# Patient Record
Sex: Male | Born: 1955
Health system: Southern US, Community
[De-identification: ages and names within clinical notes are randomized; demographics above are authoritative.]

## PROBLEM LIST (undated history)

## (undated) DIAGNOSIS — I2510 Atherosclerotic heart disease of native coronary artery without angina pectoris: Secondary | ICD-10-CM

## (undated) DIAGNOSIS — M1711 Unilateral primary osteoarthritis, right knee: Secondary | ICD-10-CM

## (undated) DIAGNOSIS — C4490 Unspecified malignant neoplasm of skin, unspecified: Secondary | ICD-10-CM

## (undated) DIAGNOSIS — M1990 Unspecified osteoarthritis, unspecified site: Secondary | ICD-10-CM

## (undated) DIAGNOSIS — S83249A Other tear of medial meniscus, current injury, unspecified knee, initial encounter: Secondary | ICD-10-CM

## (undated) DIAGNOSIS — Z9889 Other specified postprocedural states: Secondary | ICD-10-CM

## (undated) DIAGNOSIS — M25561 Pain in right knee: Secondary | ICD-10-CM

## (undated) DIAGNOSIS — F419 Anxiety disorder, unspecified: Secondary | ICD-10-CM

## (undated) DIAGNOSIS — Z955 Presence of coronary angioplasty implant and graft: Secondary | ICD-10-CM

## (undated) DIAGNOSIS — I4892 Unspecified atrial flutter: Secondary | ICD-10-CM

## (undated) DIAGNOSIS — I472 Ventricular tachycardia: Secondary | ICD-10-CM

## (undated) DIAGNOSIS — E785 Hyperlipidemia, unspecified: Secondary | ICD-10-CM

## (undated) DIAGNOSIS — K219 Gastro-esophageal reflux disease without esophagitis: Secondary | ICD-10-CM

## (undated) DIAGNOSIS — K5790 Diverticulosis of intestine, part unspecified, without perforation or abscess without bleeding: Secondary | ICD-10-CM

## (undated) DIAGNOSIS — I10 Essential (primary) hypertension: Secondary | ICD-10-CM

## (undated) DIAGNOSIS — K5792 Diverticulitis of intestine, part unspecified, without perforation or abscess without bleeding: Secondary | ICD-10-CM

## (undated) DIAGNOSIS — S83209A Unspecified tear of unspecified meniscus, current injury, unspecified knee, initial encounter: Principal | ICD-10-CM

## (undated) HISTORY — DX: Diverticulitis of intestine, part unspecified, without perforation or abscess without bleeding: K57.92

## (undated) HISTORY — PX: COLONOSCOPY: SHX174

## (undated) HISTORY — DX: Other tear of medial meniscus, current injury, unspecified knee, initial encounter: S83.249A

## (undated) HISTORY — PX: APPENDECTOMY: SHX54

## (undated) HISTORY — DX: Essential (primary) hypertension: I10

## (undated) HISTORY — DX: Hyperlipidemia, unspecified: E78.5

## (undated) HISTORY — DX: Pain in right knee: M25.561

## (undated) HISTORY — DX: Gastro-esophageal reflux disease without esophagitis: K21.9

## (undated) HISTORY — PX: ROTATOR CUFF REPAIR: SHX139

## (undated) HISTORY — PX: TRICEPS TENDON REPAIR: SHX2577

## (undated) HISTORY — PX: TONSILLECTOMY: SUR1361

## (undated) HISTORY — PX: HAND SURGERY: SHX662

## (undated) HISTORY — PX: KNEE ARTHROSCOPY: SUR90

## (undated) HISTORY — DX: Unspecified tear of unspecified meniscus, current injury, unspecified knee, initial encounter: S83.209A

## (undated) HISTORY — DX: Anxiety disorder, unspecified: F41.9

---

## 1997-11-22 ENCOUNTER — Encounter: Payer: Self-pay | Admitting: Internal Medicine

## 1997-11-22 ENCOUNTER — Inpatient Hospital Stay (HOSPITAL_COMMUNITY): Admission: EM | Admit: 1997-11-22 | Discharge: 1997-11-25 | Payer: Self-pay | Admitting: Internal Medicine

## 1999-03-01 ENCOUNTER — Ambulatory Visit (HOSPITAL_COMMUNITY): Admission: RE | Admit: 1999-03-01 | Discharge: 1999-03-01 | Payer: Self-pay | Admitting: Internal Medicine

## 1999-03-01 ENCOUNTER — Encounter: Payer: Self-pay | Admitting: Internal Medicine

## 2000-02-05 ENCOUNTER — Emergency Department (HOSPITAL_COMMUNITY): Admission: EM | Admit: 2000-02-05 | Discharge: 2000-02-05 | Payer: Self-pay | Admitting: Emergency Medicine

## 2001-11-24 ENCOUNTER — Emergency Department (HOSPITAL_COMMUNITY): Admission: EM | Admit: 2001-11-24 | Discharge: 2001-11-24 | Payer: Self-pay | Admitting: Emergency Medicine

## 2001-11-24 ENCOUNTER — Encounter: Payer: Self-pay | Admitting: Emergency Medicine

## 2002-02-03 ENCOUNTER — Ambulatory Visit (HOSPITAL_COMMUNITY): Admission: RE | Admit: 2002-02-03 | Discharge: 2002-02-03 | Payer: Self-pay | Admitting: Cardiology

## 2002-02-03 ENCOUNTER — Encounter: Payer: Self-pay | Admitting: Cardiology

## 2002-06-29 ENCOUNTER — Ambulatory Visit (HOSPITAL_BASED_OUTPATIENT_CLINIC_OR_DEPARTMENT_OTHER): Admission: RE | Admit: 2002-06-29 | Discharge: 2002-06-29 | Payer: Self-pay | Admitting: Orthopedic Surgery

## 2003-04-16 ENCOUNTER — Encounter: Admission: RE | Admit: 2003-04-16 | Discharge: 2003-04-16 | Payer: Self-pay | Admitting: Cardiology

## 2003-09-21 ENCOUNTER — Ambulatory Visit (HOSPITAL_BASED_OUTPATIENT_CLINIC_OR_DEPARTMENT_OTHER): Admission: RE | Admit: 2003-09-21 | Discharge: 2003-09-21 | Payer: Self-pay | Admitting: Orthopedic Surgery

## 2003-09-21 ENCOUNTER — Ambulatory Visit (HOSPITAL_COMMUNITY): Admission: RE | Admit: 2003-09-21 | Discharge: 2003-09-21 | Payer: Self-pay | Admitting: Orthopedic Surgery

## 2005-01-22 ENCOUNTER — Ambulatory Visit (HOSPITAL_BASED_OUTPATIENT_CLINIC_OR_DEPARTMENT_OTHER): Admission: RE | Admit: 2005-01-22 | Discharge: 2005-01-22 | Payer: Self-pay | Admitting: Orthopedic Surgery

## 2005-01-22 ENCOUNTER — Ambulatory Visit (HOSPITAL_COMMUNITY): Admission: RE | Admit: 2005-01-22 | Discharge: 2005-01-22 | Payer: Self-pay | Admitting: Orthopedic Surgery

## 2005-03-13 ENCOUNTER — Ambulatory Visit (HOSPITAL_BASED_OUTPATIENT_CLINIC_OR_DEPARTMENT_OTHER): Admission: RE | Admit: 2005-03-13 | Discharge: 2005-03-13 | Payer: Self-pay | Admitting: Otolaryngology

## 2005-03-13 ENCOUNTER — Encounter (INDEPENDENT_AMBULATORY_CARE_PROVIDER_SITE_OTHER): Payer: Self-pay | Admitting: Specialist

## 2005-05-24 ENCOUNTER — Ambulatory Visit (HOSPITAL_COMMUNITY): Admission: RE | Admit: 2005-05-24 | Discharge: 2005-05-24 | Payer: Self-pay | Admitting: Internal Medicine

## 2005-07-19 ENCOUNTER — Other Ambulatory Visit: Admission: RE | Admit: 2005-07-19 | Discharge: 2005-07-19 | Payer: Self-pay | Admitting: Interventional Radiology

## 2005-07-19 ENCOUNTER — Encounter (INDEPENDENT_AMBULATORY_CARE_PROVIDER_SITE_OTHER): Payer: Self-pay | Admitting: *Deleted

## 2005-07-19 ENCOUNTER — Encounter: Admission: RE | Admit: 2005-07-19 | Discharge: 2005-07-19 | Payer: Self-pay | Admitting: General Surgery

## 2006-07-07 IMAGING — US US BIOPSY
1 series · 7 of 7 positions shown · non-contrast
Comparison: none

CLINICAL DATA: Solitary nodule in the right lobe.
 ULTRASOUND GUIDED RIGHT THYROID BIOPSY:
 Procedure:  The right neck was prepped and draped in a sterile fashion.  Lidocaine was utilized for local anesthesia.  Three 25 gauge fine needle aspirates of the right thyroid nodule were obtained.  No complications.

[Series 1: unknown · 0.09mm/px · 7 of 7 slices shown]
[im 1/7]
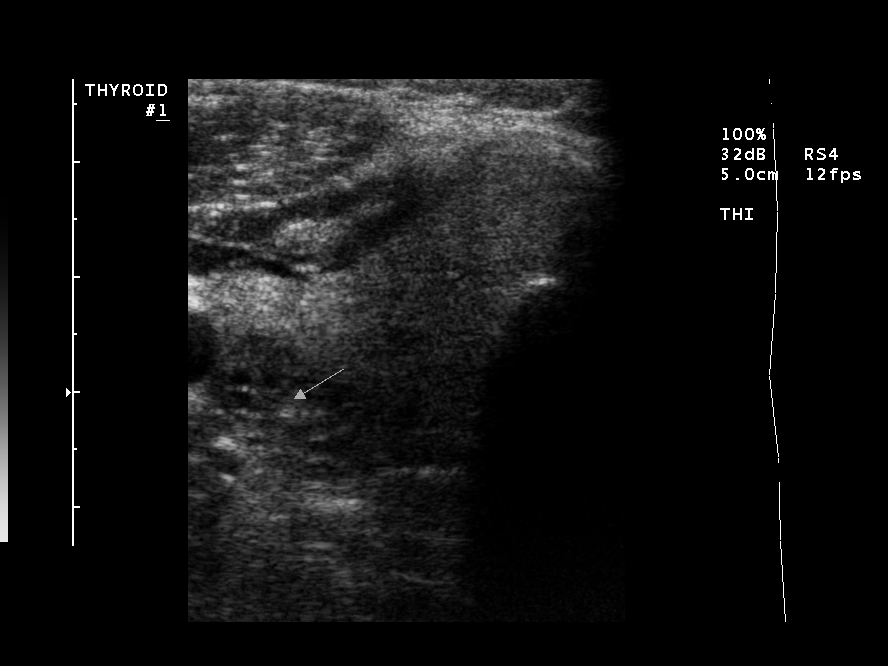
[im 2/7]
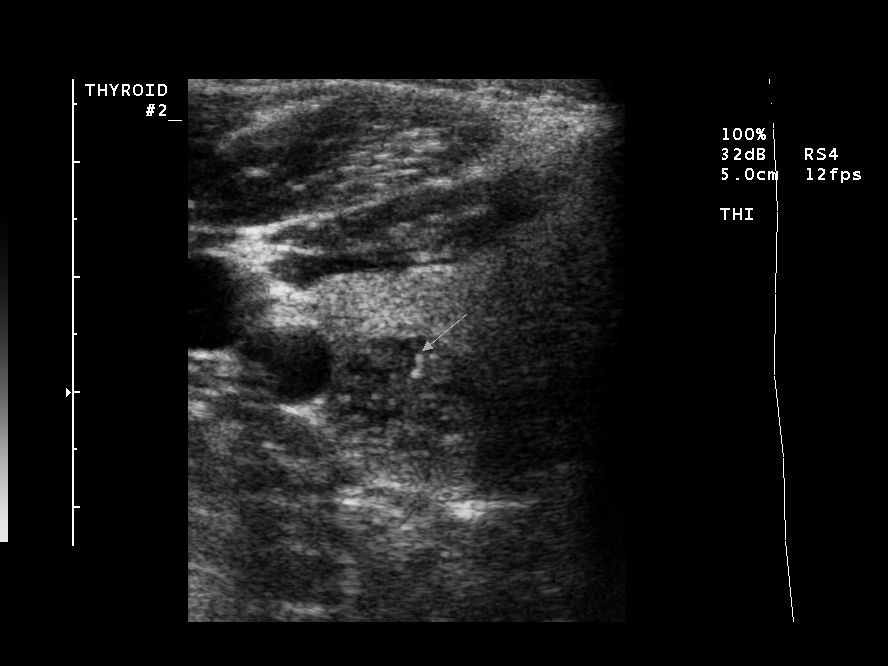
[im 3/7]
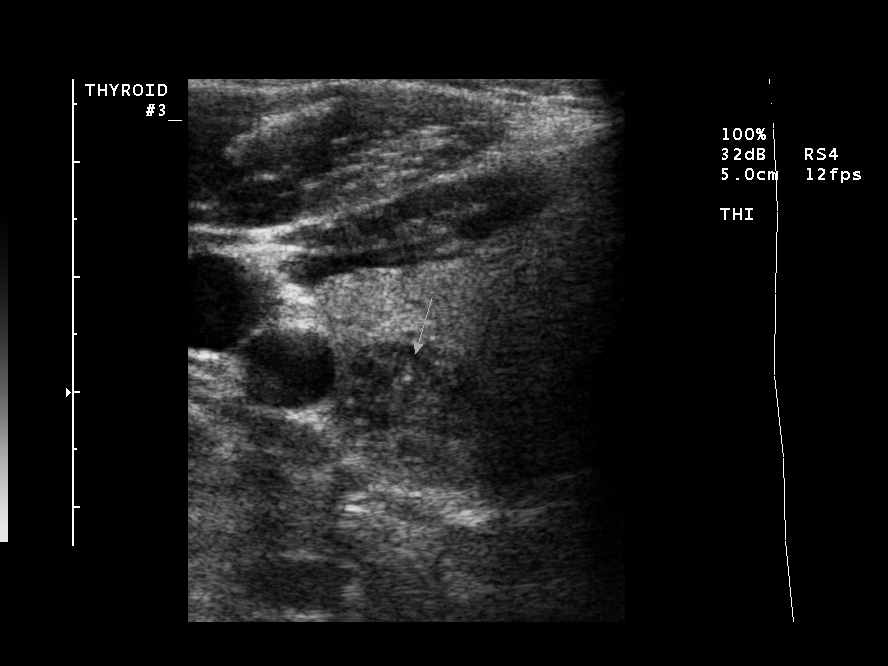
[im 4/7]
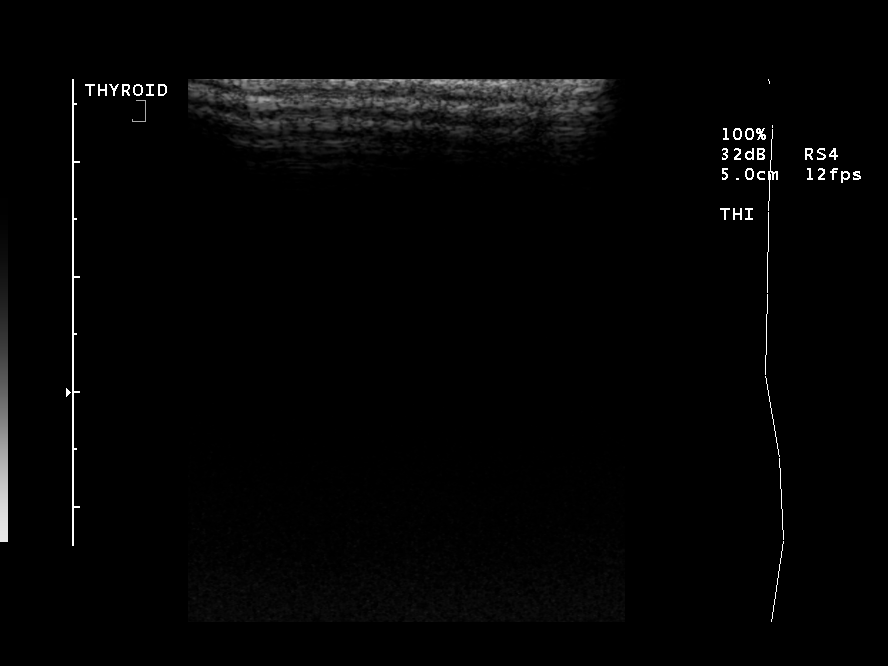
[im 5/7]
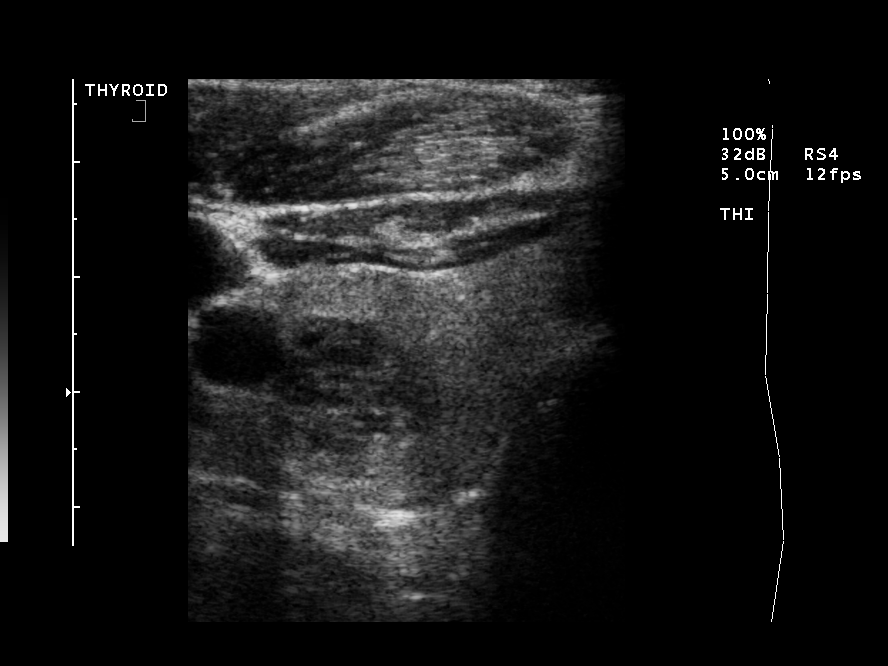
[im 6/7]
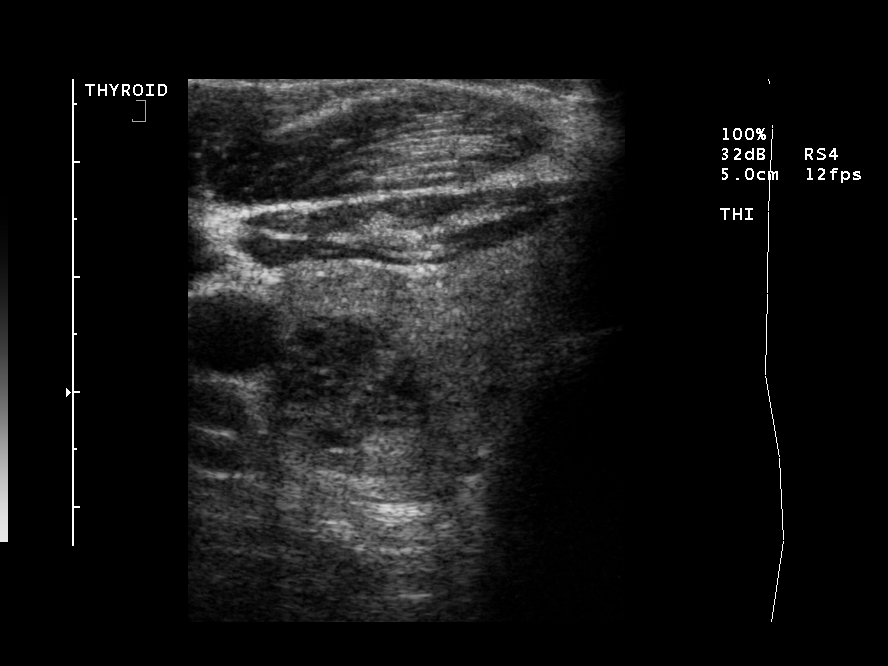
[im 7/7]
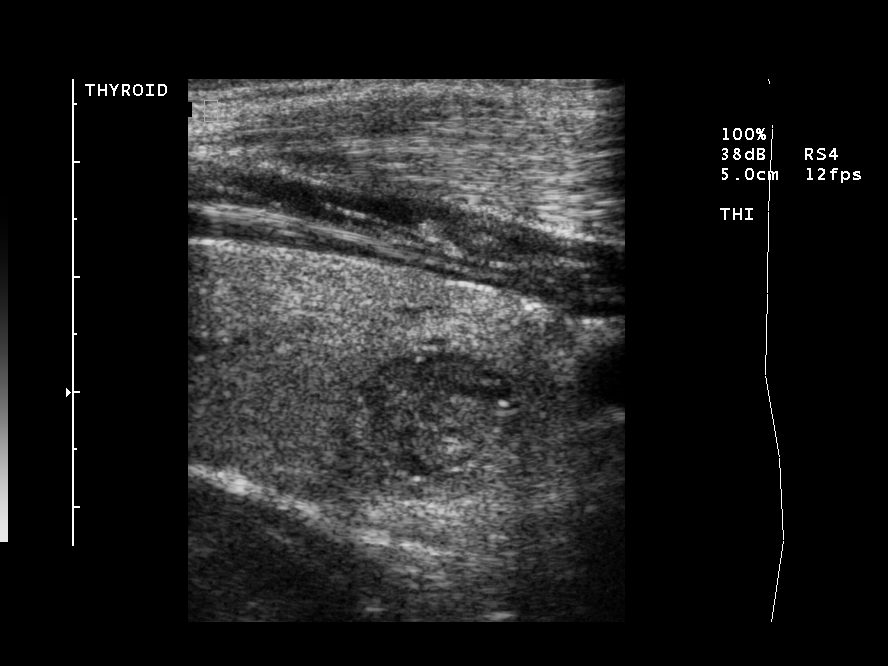

[7 of 7 positions shown; findings below may reference images not displayed]

FINDINGS: The images document needle placement in the right thyroid nodule.  Post biopsy imaging demonstrated no complication.
IMPRESSION: Successful fine needle aspiration biopsy of a right thyroid nodule.

## 2010-01-15 ENCOUNTER — Emergency Department (HOSPITAL_COMMUNITY): Admission: EM | Admit: 2010-01-15 | Discharge: 2010-01-15 | Payer: Self-pay | Admitting: Emergency Medicine

## 2010-02-19 HISTORY — PX: CORONARY ANGIOPLASTY WITH STENT PLACEMENT: SHX49

## 2010-07-07 NOTE — Op Note (Signed)
Marvin Carter, Marvin Carter              ACCOUNT NO.:  [id number]   MEDICAL RECORD NO.:  [id number]          PATIENT TYPE:  AMB   LOCATION:  DSC                          FACILITY:  [facility]   PHYSICIAN:  Robert A. Thurston Hole, M.D. DATE OF BIRTH:  05-Jun-1955   DATE OF PROCEDURE:  01/22/2005  DATE OF DISCHARGE:                                 OPERATIVE REPORT   PREOPERATIVE DIAGNOSIS:  1.  Left shoulder rotator cuff tear.  2.  Left shoulder biceps tendon proximal rupture  3.  Left shoulder impingement.  4.  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   POSTOPERATIVE DIAGNOSIS:  1.  Left shoulder rotator cuff tear.  2.  Left shoulder biceps tendon proximal rupture  3.  Left shoulder impingement.  4.  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   PROCEDURE:  1.  Left shoulder examination under anesthesia followed by arthroscopically      assisted rotator cuff repair using Arthrex suture anchors x2.  2.  Left shoulder biceps tendon rupture debridement and labrum tear      debridement.  3.  Left shoulder subacromial decompression.  4.  Left shoulder distal clavicle excision.   SURGEON:  Elana Alm. Thurston Hole, M.D.   ASSISTANT:  Julien Girt, P.A.   ANESTHESIA:  General.   OPERATIVE TIME:  1 hour 10 minutes.   COMPLICATIONS:  None.   INDICATIONS FOR PROCEDURE:  Marvin Carter is a 55 year old gentleman who has  had significant left shoulder pain for the past 3-4 months increasing in  nature with exam and MRI documenting a partial versus complete rotator cuff  tear, biceps tendon tear with impingement and AC joint arthrosis who has  failed conservative care; and is now to undergo arthroscopy.   DESCRIPTION:  Marvin Carter was brought to operating room on 01/22/2005 after  an interscalene block, placed in the holding room by anesthesia. He was  placed on the operating table in the supine position. After being  placed  under general anesthesia, his left shoulder was examined. He had full range  of motion and his shoulder was stable to ligamentous exam.  He was then  placed in a beach chair position and his shoulder and arm was prepped using  sterile DuraPrep, and draped using sterile technique. He received Ancef 1 gm  IV preoperatively for prophylaxis.   Initially the arthroscopy was performed through a posterior arthroscopic  portal.  The arthroscope, with a pump attached,  was placed; and through an  anterior portal an arthroscopic probe was placed. On initial inspection the  articular cartilage in the glenohumeral joint was intact. Anterior labrum  was intact. The anterior inferior labrum and anterior inferior glenohumeral  ligament complex was intact. The biceps tendon was completely torn in the  intra-articular portion; and this was thoroughly debrided and the stump of  the biceps tendon, biceps tendon anchor, and superior labrum was thoroughly  debrided. The posterior labrum was intact. Rotator cuff showed a complete  tear of the supraspinatus which was  partially debrided arthroscopically,  partial tear of the subscap and infraspinatus 25-30%; and these were  partially debrided. The teres minor was intact. The inferior capsular recess  free of pathology. Subacromial space was entered and a lateral arthroscopic  portal was made. Moderately thickened bursitis was resected. The rotator  cuff tear was well visualized on the bursal surface and this was further  debrided arthroscopically. Impingement was noted and a subacromial  decompression was carried out removing 6 mm of the undersurface of the  anterior, anterolateral, anteromedial, acromion, and CA ligament; and  release carried out as well. There was also significant spurring and  degenerative changes in the [facility] joint distal clavicle; and the distal 5-6 mm  of clavicle was resected with the 6-mm bur.   After this was done and then through  an accessory anterolateral portal, two  separate Arthrex suture anchors, 5.5 anchors were placed proximally 4-5 mm  apart from each other. Each of these anchors had two sutures in them; and  each of these sutures were passed, arthroscopically, through the rotator  cuff tear and then sequentially tied down thus four total sutures were used  to repair the rotator cuff tear back down to the greater tuberosity. After  this was done a firm and tight repair was achieved. The shoulder could then  be brought through a full range of motion with no impingement on the repair.  At this point, it was felt that all of the pathology had been satisfactorily  addressed. The instruments were removed. Portals closed with 3-0 nylon  suture. Sterile dressings and a sling applied and the patient awakened and  taken to recovery in stable condition. Followup care Marvin Carter will be  followed as an outpatient on Percocet for pain. He will see me back in the  office in a week for sutures out and followup.      Robert A. Thurston Hole, M.D.  Electronically Signed     RAW/MEDQ  D:  01/22/2005  T:  01/22/2005  Job:  [id number]

## 2010-07-07 NOTE — Op Note (Signed)
NAME:  Marvin Carter, Marvin Carter                        ACCOUNT NO.:  [id number]   MEDICAL RECORD NO.:  [id number]                   PATIENT TYPE:  EMS   LOCATION:  MINO                                 FACILITY:  [facility]   PHYSICIAN:  Dionne Ano. Everlene Other, M.D.         DATE OF BIRTH:  03/30/1955   DATE OF PROCEDURE:  11/24/2001  DATE OF DISCHARGE:                                 OPERATIVE REPORT   HISTORY OF PRESENT ILLNESS:  I had the pleasure to see this patient upon the  consultation request by Doug Sou, M.D.  He is a 55 year old right-hand  dominant male who injured his bilateral middle fingers as a steel cutting  device slapped a sheet of steel, subsequently lacerating the tips of his  middle fingers bilaterally.  He sustained amputations.  He has the right  middle finger tip for my review.  He is up to date on his tetanus.  The  patient was given 1 g of Ancef through IV access in the left upper  extremity.  He was transported to the emergency room via ambulance, and I  was asked to see him by Dr. Ethelda Chick.  The patient is alert and oriented.  He is accompanied in the ER with his wife.   PAST MEDICAL HISTORY:  History of hypertension.   PAST SURGICAL HISTORY:  Appendectomy, knee arthroscopies, biceps and  subscapularis reattachment.   MEDICATIONS:  Hypertension medicine.   ALLERGIES:  None.   SOCIAL HISTORY:  He occasionally drinks.  He is self-employed.  He works for  [employer].   PHYSICAL EXAMINATION:  GENERAL:  He is a very pleasant male, alert and  oriented, in no acute distress.  HEENT:  Normal.  CHEST:  Clear.  ABDOMEN:  Nontender, nondistended.  EXTREMITIES:  The bilateral lower extremities are atraumatic.  The middle  fingers bilaterally had distal tip amputations present.  FDP, FDS, and  extensor function is intact.  There is no obvious instability.  There are no  signs of active infection.  The middle finger, right hand, is amputated at  the  mid-sterile matrix level.  The amputation is oblique in nature.  The  amputation about the left middle finger involves the tip but no bone.  There  is exposed subcu, and the nail is primarily intact.  I have discussed the  issues at hand with him.   LABORATORY DATA:  His x-rays were reviewed, which show a fracture about the  right middle finger distal phalanx.  The left middle finger is without  fracture or encroachment.   IMPRESSION:  Distal tip amputations, bilateral middle fingers, right and  left.   PLAN:  I have verbally consented him for I&D and repair of structures as  necessary.  With this in mind, he desires to proceed.   DESCRIPTION OF PROCEDURE:  The patient was given a lidocaine-Marcaine  without epinephrine block by myself.  Following this he was prepped and  draped in the usual sterile fashion bilaterally with Betadine scrub and  paint.  Once this was done, a sterile field was secured and the patient  underwent I&D of the skin and subcutaneous tissue about the left middle  finger.  Partial nail plate removal was accomplished as necessary and  following this, the area was thoroughly cleaned and dressed.  This was  dressed with Adaptic and will likely do well by healing with secondary  intention.  Once this was done and a sterile dressing was applied, the  attention was turned toward the right middle finger, where I&D of skin,  subcutaneous tissue, and bone occurred.  This was incisional debridement  without difficulty removing particulate matter secondary to an open  fracture.  Following this the patient underwent a V-Y type advancement flap.  This was done by making cuts with a #15 blade knife, gently mobilizing the  flap under 4.0 loupe magnification, defatting the area, and then suturing it  to the nail bed.  The nail plate removal was accomplished during this  period.  He tolerated this well.  This was a volar advancement flap  utilizing cuts.  He tolerated this well.   The patient had gentle  mobilization and insetting of the volar flap.  Following this, Adaptic was  placed under the eponychial fold and the patient then had refill noted to be  excellent.  I at this point in time placed a sterile dressing on the finger  and then dressed this accordingly with finger splint applied.  Thus he  underwent I&D of the open fracture as well as volar advancement flap and  nail plate removal.  He tolerated this well.  Once a dressing was applied, I  discussed with him elevation and other precautions.  I have asked him to not  smoke, to eat healthy, and to take Keflex 500 mg one p.o. q.i.d. x7 days.  I  have also given him Percocet and Robaxin to be taken as directed.  The  Percocet is 7.5 mg one p.o. q.6h., the Robaxin is 500 mg one p.o. q.6-8h.  p.r.n. spasm.  He will return to the office and see my therapist in three  days and return to see me in eight to 10 days.  I have asked him to notify  me should any problems occur; otherwise, I will look forward to seeing him  in the office in follow-up.  We have gone over all precautions, etc., all  questions have been encouraged and answered.                                                Dionne Ano. Everlene Other, M.D.    Nash Mantis  D:  11/24/2001  T:  11/25/2001  Job:  [id number]   cc:   Doug Sou, M.D.

## 2010-07-07 NOTE — Op Note (Signed)
Marvin Carter, Marvin Carter                        ACCOUNT NO.:  [id number]   MEDICAL RECORD NO.:  [id number]                   PATIENT TYPE:  AMB   LOCATION:  DSC                                  FACILITY:  [facility]   PHYSICIAN:  Robert A. Thurston Hole, M.D.              DATE OF BIRTH:  30-Sep-1955   DATE OF PROCEDURE:  09/21/2003  DATE OF DISCHARGE:                                 OPERATIVE REPORT   PREOPERATIVE DIAGNOSES:  Left knee medial and lateral meniscal tears with  chondromalacia/degenerative joint disease.   POSTOPERATIVE DIAGNOSES:  Left knee medial and lateral meniscal tears with  chondromalacia/degenerative joint disease.   PROCEDURE:  1. Left examination under anesthesia, followed by an arthroscopic partial     medial and lateral meniscectomies.  2. Left knee chondroplasty.   SURGEON:  Elana Alm. Thurston Hole, M.D.   ASSISTANT:  Julien Girt, P.A.   ANESTHESIA:  General.   OPERATIVE TIME:  Was 30 minutes.   COMPLICATIONS:  None.   INDICATIONS FOR PROCEDURE:  The patient is a 55 year old gentleman who has  had two to three months of increasing left knee pain with an examination and  MRI documenting medial and lateral meniscal tears with chondromalacia and  degenerative joint disease, who has failed conservative care and is now to  undergo an arthroscopy.   DESCRIPTION OF PROCEDURE:  The patient is brought to the operating room on  September 21, 2003, and placed on the operating room table in the supine  position.  After an adequate level of general anesthesia was obtained, his  left knee was examined under anesthesia.  Range of motion was 0-125 degrees,  with 1-2+ crepitation.  The knee was stable to ligamentous examination, with  normal patella tracking.  The left leg was prepped using sterile DuraPrep  and draped using a sterile technique and sterilely injected with 0.25%  Marcaine with epinephrine.  Initially the arthroscopy was performed through  an anterolateral portal.   The arthroscope with a pump attached was placed,  and through an anteromedial portal an arthroscopic probe was placed.  On  initial inspection of the medial compartment, he had 75% grade 3  chondromalacia which was debrided.  He had tearing of the posterior and  medial horn of the medial meniscus, of which 50% was resected back to a  stable rim.  The inner condylar notch was inspected.  The anterior and  posterior cruciate ligaments were normal.  The lateral compartment showed  75% grade 4, and the rest grade 3 chondromalacia, which was debrided.  The  lateral meniscal remnant posterior horn, of which only 20% remained, was  further debrided by another 50% for tears.  The patellofemoral joint showed  75%-80% grade 3 and grade 4 chondromalacia and degenerative joint disease on  the patella and femoral groove, and this was debrided.  The patella tracked  normally.  The medial and lateral gutters were free of pathology.  After  this was done, it was felt that all pathology had been satisfactorily  addressed.  The instruments were removed.  The portals were closed with #3-0  nylon suture and injected with 0.25% Marcaine with epinephrine and 4 mg of  morphine.  Sterile dressings were applied.  The patient was awakened and taken to the recovery room in stable condition.   FOLLOW-UP CARE:  The patient will be followed as an outpatient on Vicodin  and Naprosyn.  I will see him back in the office in one week for sutures out  and followup.                                               Robert A. Thurston Hole, M.D.    RAW/MEDQ  D:  09/21/2003  T:  09/21/2003  Job:  [id number]

## 2010-07-07 NOTE — Op Note (Signed)
NAMECLYDE, Marvin Carter                        ACCOUNT NO.:  [id number]   MEDICAL RECORD NO.:  [id number]                   PATIENT TYPE:  AMB   LOCATION:  DSC                                  FACILITY:  [facility]   PHYSICIAN:  Feliberto Gottron. Turner Daniels, M.D.                DATE OF BIRTH:  07/16/1955   DATE OF PROCEDURE:  06/29/2002  DATE OF DISCHARGE:                                 OPERATIVE REPORT   PREOPERATIVE DIAGNOSIS:  Left knee intra-articular loose bodies, medial  meniscal tear, and grade III to IV chondromalacia of the lateral  compartment.   POSTOPERATIVE DIAGNOSIS:  Left knee intra-articular loose bodies, medial  meniscal tear, and grade III to IV chondromalacia of the lateral  compartment.   PROCEDURE:  Left knee arthroscopic removal of loose bodies x3.  Partial  medial meniscectomy. Debridement of chondromalacia extensive grade III to  grade IV of the lateral compartment.   SURGEON:  Feliberto Gottron. Turner Daniels, M.D.   ASSISTANTLaural Benes. Jannet Mantis.   ANESTHESIA:  General LMA.   ESTIMATED BLOOD LOSS:  Minimal.   FLUIDS REPLACED:  800 mL of crystalloid.  Drains placed; none.   TOURNIQUET TIME:  None.   INDICATIONS FOR PROCEDURE:  A 55 year old man who injured his left knee  playing football in the [employer] and had an open lateral meniscectomy  performed by Dr. Jacqulyn Bath some years ago. He has also had three subsequent  arthroscopic washouts of the left knee and now has catching, locking, and  pain in his left knee, flexion contracture, and a slight valgus deformity.  In addition, x-rays did show what appeared to be intra-articular loose  bodies in the left knee.  Because of this, he is taken for arthroscopic  evaluation and treatment of his left knee.   DESCRIPTION OF PROCEDURE:  The patient was identified by armband and taken  to the operating room at [facility]. Appropriate anesthetic  monitors were attached and general LMA anesthesia induced with the patient  in the  supine position.  Lateral ports were applied to the table and the  left lower extremity was prepped and draped in the usual sterile fashion  from the ankle to the mid thigh and the inferolateral and inferomedial  peripatellar portal regions were infiltrated with 2 to 3 mL of 0.5% Marcaine  and epinephrine solution.  Using a #11 blade, standard inferomedial and  inferolateral peripatellar portals were then made without difficulty. The  knee was then filled with normal saline solution under 60 to 80 mmHg and  diagnostic arthroscopy did reveal some large cartilaginous osteochondral  loose bodies in the anterior aspect of the notch that did require removal  using pituitary rongeurs and a standard inferomedial incision that was about  1 cm in length.  The patient did have some tearing of the medial meniscus  which was debrided back to a stable margin with a combination  of a 3.5 and a  4.2 [place] sucker shaver, on the lateral side it was down to bear bone  over about 90% of the surface area, there were some flap tears that were  also debrided to remove any loose cartilage from the lateral side of the  knee. There was essentially no lateral meniscus remaining.  The anterior  cruciate ligament was noted to be intact. Small loose bodies were cleared  from the gutters. The knee was then washed out with normal saline solution  using the arthroscope and the arthroscopic instruments were removed. A  dressing of Xeroform, 4x4 dressing, sponges, Webril, and Ace wrap were  applied. The patient was awakened and taken to the recovery room without  difficulty.                                               Feliberto Gottron. Turner Daniels, M.D.    Ovid Curd  D:  06/29/2002  T:  06/30/2002  Job:  [id number]

## 2010-07-07 NOTE — Op Note (Signed)
NAMEEULA, MAZZOLA              ACCOUNT NO.:  [id number]   MEDICAL RECORD NO.:  [id number]          PATIENT TYPE:  AMB   LOCATION:  DSC                          FACILITY:  [facility]   PHYSICIAN:  Christopher E. Ezzard Standing, M.D.DATE OF BIRTH:  1955/11/05   DATE OF PROCEDURE:  03/13/2005  DATE OF DISCHARGE:                                 OPERATIVE REPORT   PREOPERATIVE DIAGNOSIS:  Occipital mass.   POSTOPERATIVE DIAGNOSIS:  Occipital mass consistent with a probable lipoma.   OPERATION:  Excision of occipital mass.   ANESTHESIA:  Local, 1% Xylocaine with 1:100,000 epinephrine.   SURGEON:  Kristine Garbe. Ezzard Standing, MD.   COMPLICATIONS:  None.   BRIEF CLINICAL NOTE:  Marvin Carter is a 55 year old gentleman, who has  noted a mass in the back of his neck now for about six months.  He feels  like it has gotten a little bit larger.  On examination, he has an  approximate 2 cm mass in the occipital area of his neck.  He is taken to the  operating room at this time for excision of occipital mass under local  anesthetic.   DESCRIPTION OF PROCEDURE:  The patient was placed in the prone position.  The hair overlying the mass was excised with clippers.  The neck was prepped  with Betadine solution, draped out with sterile towels, and the area was  injected with 6 ml of Xylocaine with epinephrine.  A horizontal incision was  made directly over the mass.  Dissection was carried down through the dermis  subcutaneous tissue in the midline of the neck.  It appeared to be a fatty-  type mass consistent with a probable lipoma.  This was excised with the  scissors and cautery.  Hemostasis was obtained with cautery.  The wound was  irrigated with saline and then closed with 3-0 chromic sutures  subcutaneously and 4-0 nylon to reapproximate the skin edges.  The mass was  sent to Pathology.  This completed the procedure.  A dressing was applied.  The patient was subsequently discharged home later this  morning.   DISPOSITION:  Marvin Carter is discharged home later this morning on Tylenol and  Vicodin p.r.n. pain, Keflex 500 mg b.i.d. for three days.  We will have him  follow up in my office in one week for recheck and to have his sutures  removed.           ______________________________  Kristine Garbe Ezzard Standing, M.D.    CEN/MEDQ  D:  03/13/2005  T:  03/13/2005  Job:  [id number]   cc:   Lucky Cowboy, M.D.  Fax: [phone]

## 2010-07-21 ENCOUNTER — Encounter: Payer: Self-pay | Admitting: Gastroenterology

## 2010-09-01 ENCOUNTER — Encounter: Payer: Self-pay | Admitting: Gastroenterology

## 2010-09-01 ENCOUNTER — Ambulatory Visit: Payer: Self-pay | Admitting: Gastroenterology

## 2010-10-19 ENCOUNTER — Ambulatory Visit (INDEPENDENT_AMBULATORY_CARE_PROVIDER_SITE_OTHER): Payer: Managed Care, Other (non HMO) | Admitting: Gastroenterology

## 2010-10-19 ENCOUNTER — Encounter: Payer: Self-pay | Admitting: Gastroenterology

## 2010-10-19 DIAGNOSIS — K649 Unspecified hemorrhoids: Secondary | ICD-10-CM

## 2010-10-19 DIAGNOSIS — K625 Hemorrhage of anus and rectum: Secondary | ICD-10-CM

## 2010-10-19 DIAGNOSIS — K644 Residual hemorrhoidal skin tags: Secondary | ICD-10-CM

## 2010-10-19 HISTORY — DX: Residual hemorrhoidal skin tags: K64.4

## 2010-10-19 MED ORDER — PEG-KCL-NACL-NASULF-NA ASC-C 100 G PO SOLR: 1.0000 | Freq: Once | ORAL | Status: DC

## 2010-10-19 MED ORDER — HYDROCORTISONE ACETATE 25 MG RE SUPP: 25.0000 mg | Freq: Two times a day (BID) | RECTAL | Status: DC

## 2010-10-19 NOTE — Patient Instructions (Signed)
Colonoscopy A colonoscopy is an exam to evaluate your entire colon. In this exam, your colon is cleansed. A long fiberoptic tube is inserted through your rectum and into your colon. The fiberoptic scope (endoscope) is a long bundle of enclosed and very flexible fibers. These fibers transmit light to the area examined and send images from that area to your caregiver. Discomfort is usually minimal. You may be given a drug to help you sleep (sedative) during or prior to the procedure. This exam helps to detect lumps (tumors), polyps, inflammation, and areas of bleeding. Your caregiver may also take a small piece of tissue (biopsy) that will be examined under a microscope. BEFORE THE PROCEDURE  A clear liquid diet may be required for 2 days before the exam.   Liquid injections (enemas) or laxatives may be required.   A large amount of electrolyte solution may be given to you to drink over a short period of time. This solution is used to clean out your colon.   You should be present 1 prior to your procedure or as directed by your caregiver.   Check in at the admissions desk to fill out necessary forms if not preregistered. There will be consent forms to sign prior to the procedure. If accompanied by friends or family, there is a waiting area for them while you are having your procedure.  LET YOUR CAREGIVER KNOW ABOUT:  Allergies to food or medicine.  Medicines taken, including vitamins, herbs, eyedrops, over-the-counter medicines, and creams.   Use of steroids (by mouth or creams).   Previous problems with anesthetics or numbing medicines.   History of bleeding problems or blood clots.  Previous surgery.   Other health problems, including diabetes and kidney problems.   Possibility of pregnancy, if this applies.   AFTER THE PROCEDURE  If you received a sedative and/or pain medicine, you will need to arrange for someone to drive you home.   Occasionally, there is a little blood passed  with the first bowel movement. DO NOT be concerned.  HOME CARE INSTRUCTIONS  It is not unusual to pass moderate amounts of gas and experience mild abdominal cramping following the procedure. This is due to air being used to inflate your colon during the exam. Walking or a warm pack on your belly (abdomen) may help.   You may resume all normal meals and activities after sedatives and medicines have worn off.   Only take over-the-counter or prescription medicines for pain, discomfort, or fever as directed by your caregiver. DO NOT use aspirin or blood thinners if a biopsy was taken. Consult your caregiver for medicine usage if biopsies were taken.  FINDING OUT THE RESULTS OF YOUR TEST Not all test results are available during your visit. If your test results are not back during the visit, make an appointment with your caregiver to find out the results. Do not assume everything is normal if you have not heard from your caregiver or the medical facility. It is important for you to follow up on all of your test results. SEEK IMMEDIATE MEDICAL CARE IF:  You pass large blood clots or fill a toilet with blood following the procedure. This may also occur 10 to 14 days following the procedure. This is more likely if a biopsy was taken.   You develop abdominal pain that keeps getting worse and cannot be relieved with medicine.  Document Released: 02/03/2000 Document Re-Released: 05/02/2009 [facility] Patient Information 2011 [place], [place]. Your Colonoscopy is scheduled on 10/26/2010 at  2pm We have sent your MoviPrep to your pharmacy

## 2010-10-19 NOTE — Assessment & Plan Note (Signed)
His external hemorrhoid has interfered with adequate hygiene. It may be the source of his bleeding as well.  Recommendations #1 Anusol HC suppositories #2 if no other bleeding sources is seen by colonoscopy I will refer him to Gen. surgery for consideration of removal of his external hemorrhoid

## 2010-10-19 NOTE — Progress Notes (Signed)
History of Present Illness:  Mr. Marvin Carter is a pleasant 55 year old white male referred at the request of Lucky Cowboy, MD for evaluation of rectal bleeding. This is been an intermittent problem consisting of small amounts of blood mixed with the stools or on the tissue. He has a history of hemorrhoids. Colonoscopy in 2004 showed diverticulosis. His only complaint is loose stools that typically occur in the morning. He had an external hemorrhoid that has bled in the past. It sounds like he had a thrombosed hemorrhoid as well. Hemoglobin in June, 2012 with 16.5.     Review of Systems: Pertinent positive and negative review of systems were noted in the above HPI section. All other review of systems were otherwise negative.    Current Medications, Allergies, Past Medical History, Past Surgical History, Family History and Social History were reviewed in [facility] electronic medical record  Vital signs were reviewed in today's medical record. Physical Exam: General: Well developed , well nourished, no acute distress Head: Normocephalic and atraumatic Eyes:  sclerae anicteric, EOMI Ears: Normal auditory acuity Mouth: No deformity or lesions Lungs: Clear throughout to auscultation Heart: Regular rate and rhythm; no murmurs, rubs or bruits Abdomen: Soft, non tender and non distended. No masses, hepatosplenomegaly or hernias noted. Normal Bowel sounds Rectal: There is a large external hemorrhoid Musculoskeletal: Symmetrical with no gross deformities  Pulses:  Normal pulses noted Extremities: No clubbing, cyanosis, edema or deformities noted Neurological: Alert oriented x 4, grossly nonfocal Psychological:  Alert and cooperative. Normal mood and affect

## 2010-10-19 NOTE — Assessment & Plan Note (Signed)
Limited rectal bleeding is probably secondary to hemorrhoids. A more proximal colonic bleeding source should be ruled out.  Recommendations #1 colonoscopy  Risks, alternatives, and complications of the procedure, including bleeding, perforation, and possible need for surgery, were explained to the patient.  Patient's questions were answered.

## 2010-10-26 ENCOUNTER — Encounter: Payer: Self-pay | Admitting: Gastroenterology

## 2010-10-26 ENCOUNTER — Ambulatory Visit (AMBULATORY_SURGERY_CENTER): Payer: Managed Care, Other (non HMO) | Admitting: Gastroenterology

## 2010-10-26 DIAGNOSIS — K625 Hemorrhage of anus and rectum: Secondary | ICD-10-CM

## 2010-10-26 DIAGNOSIS — K649 Unspecified hemorrhoids: Secondary | ICD-10-CM

## 2010-10-26 MED ORDER — SODIUM CHLORIDE 0.9 % IV SOLN: 500.0000 mL | INTRAVENOUS | Status: DC

## 2010-10-26 NOTE — Progress Notes (Unsigned)
  Pt has symptomatic hemorrhoidal disease from external hemorrhoids.  Plan refer to surgery for consideration of Rx for external hemorrhoids.

## 2010-10-26 NOTE — Patient Instructions (Signed)
Hemorrhoids Hemorrhoids are dilated (enlarged) veins around the rectum. Sometimes clots will form in the veins. This makes them swollen and painful. These are called thrombosed hemorrhoids. Causes of hemorrhoids include:  Pregnancy: this increases the pressure in the hemorrhoidal veins.   Constipation.   Straining to have a bowel movement.  HOME CARE INSTRUCTIONS  Eat a well balanced diet and drink 6 to 8 glasses of water every day to avoid constipation. You may also use a bulk laxative.   Avoid straining to have bowel movements.   Keep anal area dry and clean.   Only take over-the-counter or prescription medicines for pain, discomfort, or fever as directed by your caregiver.  If thrombosed:  Take hot sitz baths for 20 to 30 minutes, 3 to 4 times per day.   If the hemorrhoids are very tender and swollen, place ice packs on area as tolerated. Using ice packs between sitz baths may be helpful. Fill a plastic bag with ice and use a towel between the bag of ice and your skin.   Special creams and suppositories (Anusol, Nupercainal, Wyanoids) may be used or applied as directed.   Do not use a donut shaped pillow or sit on the toilet for long periods. This increases blood pooling and pain.   Move your bowels when your body has the urge; this will require less straining and will decrease pain and pressure.   Only take over-the-counter or prescription medicines for pain, discomfort, or fever as directed by your caregiver.  SEEK MEDICAL CARE IF:  You have increasing pain and swelling that is not controlled with your prescription.   You have uncontrolled bleeding.   You have an inability or difficulty having a bowel movement.   You have pain or inflammation outside the area of the hemorrhoids.   You have chills and/or an oral temperature above 100 that lasts for 2 days or longer, or as your caregiver suggests.  MAKE SURE YOU:   Understand these instructions.   Will watch your  condition.   Will get help right away if you are not doing well or get worse.  Document Released: 02/03/2000 Document Re-Released: 01/19/2008 [facility] Patient Information 2011 [place], [place].

## 2010-10-27 ENCOUNTER — Other Ambulatory Visit: Payer: Self-pay | Admitting: Gastroenterology

## 2010-10-27 ENCOUNTER — Telehealth: Payer: Self-pay

## 2010-10-27 NOTE — Telephone Encounter (Signed)
No ID on answering machine. 

## 2010-10-27 NOTE — Telephone Encounter (Signed)
Pt scheduled to see Dr. Michaell Cowing with [facility] 11/08/10 arrival time 10:30am, appt 11am. Left message for pt to call back.

## 2010-10-30 NOTE — Telephone Encounter (Signed)
Pt aware of appt date and time

## 2010-11-08 ENCOUNTER — Ambulatory Visit (INDEPENDENT_AMBULATORY_CARE_PROVIDER_SITE_OTHER): Payer: Managed Care, Other (non HMO) | Admitting: Surgery

## 2010-11-08 ENCOUNTER — Encounter (INDEPENDENT_AMBULATORY_CARE_PROVIDER_SITE_OTHER): Payer: Self-pay | Admitting: Surgery

## 2010-11-08 DIAGNOSIS — K648 Other hemorrhoids: Secondary | ICD-10-CM

## 2010-11-08 DIAGNOSIS — K644 Residual hemorrhoidal skin tags: Secondary | ICD-10-CM

## 2010-11-08 HISTORY — DX: Other hemorrhoids: K64.8

## 2010-11-08 NOTE — Patient Instructions (Signed)

## 2010-11-08 NOTE — Progress Notes (Signed)
Subjective:     Patient ID: Marvin Carter, male   DOB: 30-May-1955, 55 y.o.   MRN: [id number]  HPI  Patient Care Team: Nadean Corwin as PCP - General (Internal Medicine) Louis Meckel, MD as Consulting Physician (Gastroenterology)  This patient is a 55 y.o.male who presents today for surgical evaluation from Dr. Arlyce Dice.   Reason for visit: Hemorrhoids with bleeding and prolapse.  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 He was seen in 2007 by Dr. Johna Sheriff. There is a question of a fissure back then versus bleeding hemorrhoids. He was getting better. Patient was given topical agents and offered f/u.   Patient did not have any for followup for that. He did have a thrombosed hemorrhoid lanced him by his primary care physician last year.  The patient notes he will get some occasional hemorrhoid flares with some discomfort. No severe pain. He often gets some blood with bowel movements at these times. He tends to have loose stools especially in the morning but then remains quiet for rest of the day. He has a history of colon polyps in the distant past. Most recent colonoscopy by Dr. Arlyce Dice showed no new polyps.  He notes that now hemorrhoids are out all the time. Its bothersome to him. It difficult to keep the area clean. No severe burning itching or bleeding at this time.  He was recommended to talk with surgery to see if further interventions were needed. He's never had any banding, injections or anorectal surgery.   Past Medical History  Diagnosis Date  . Hypertension   . Hyperlipidemia   . Diverticulitis   . Hemorrhoids   . GERD (gastroesophageal reflux disease)   . Anxiety     Past Surgical History  Procedure Date  . Appendectomy   . Knee arthroscopy     x 8 left  . Rotator cuff repair     bilateral  . Hand surgery     right  . Bicept tendon repair     bilateral  . Tonsillectomy   . Cholecystectomy     History   Social  History  . Marital Status: Married    Spouse Name: N/A    Number of Children: 2  . Years of Education: N/A   Occupational History  . Self Employed     Agricultural engineer  .     Social History Main Topics  . Smoking status: Never Smoker   . Smokeless tobacco: Never Used  . Alcohol Use: Yes     Social  . Drug Use: No  . Sexually Active: Not on file   Other Topics Concern  . Not on file   Social History Narrative  . No narrative on file    Family History  Problem Relation Age of Onset  . Diabetes Mother   . Heart disease Mother   . Heart disease Father     Current outpatient prescriptions:ALPRAZolam (XANAX) 1 MG tablet, , Disp: , Rfl: ;  atenolol (TENORMIN) 100 MG tablet, Take 100 mg by mouth daily.  , Disp: , Rfl: ;  atorvastatin (LIPITOR) 40 MG tablet, Take 20 mg by mouth daily.  , Disp: , Rfl: ;  buPROPion (WELLBUTRIN XL) 300 MG 24 hr tablet, Take 300 mg by mouth daily.  , Disp: , Rfl: ;  Diltiazem HCl Coated Beads (CARDIZEM CD PO), Take 1 tablet by mouth daily.  , Disp: , Rfl:  esomeprazole (NEXIUM) 40 MG capsule, Take 40 mg  by mouth daily before breakfast.  , Disp: , Rfl: ;  fenofibrate micronized (LOFIBRA) 134 MG capsule, , Disp: , Rfl: ;  furosemide (LASIX) 80 MG tablet, Take 80 mg by mouth daily.  , Disp: , Rfl: ;  hydrocortisone (ANUSOL-HC) 25 MG suppository, Place 1 suppository (25 mg total) rectally every 12 (twelve) hours., Disp: 12 suppository, Rfl: 1 Olmesartan Medoxomil (BENICAR PO), Take 1 tablet by mouth daily.  , Disp: , Rfl: ;  testosterone cypionate (DEPOTESTOTERONE CYPIONATE) 200 MG/ML injection, , Disp: , Rfl:   No Known Allergies     Review of Systems  Constitutional: Negative for fever, chills and diaphoresis.  HENT: Negative for nosebleeds, sore throat, facial swelling, mouth sores, trouble swallowing and ear discharge.   Eyes: Negative for photophobia, discharge and visual disturbance.  Respiratory: Negative for choking, chest tightness, shortness of  breath and stridor.   Cardiovascular: Negative for chest pain and palpitations.  Gastrointestinal: Positive for anal bleeding. Negative for nausea, vomiting, abdominal pain, diarrhea, constipation and abdominal distention.       BM 3-5x/day.  Most in AM.    Genitourinary: Negative for dysuria, urgency, difficulty urinating and testicular pain.  Musculoskeletal: Positive for arthralgias. Negative for myalgias, back pain and gait problem.       Chronic L knee pain  Skin: Negative for color change, pallor, rash and wound.  Neurological: Negative for dizziness, speech difficulty, weakness, numbness and headaches.  Hematological: Negative for adenopathy. Does not bruise/bleed easily.  Psychiatric/Behavioral: Negative for hallucinations, confusion and agitation.       Objective:   Physical Exam  Constitutional: He is oriented to person, place, and time. He appears well-developed and well-nourished. No distress.  HENT:  Head: Normocephalic.  Mouth/Throat: Oropharynx is clear and moist. No oropharyngeal exudate.  Eyes: Conjunctivae and EOM are normal. Pupils are equal, round, and reactive to light. No scleral icterus.  Neck: Normal range of motion. Neck supple. No tracheal deviation present.  Cardiovascular: Normal rate, regular rhythm and intact distal pulses.   Pulmonary/Chest: Effort normal and breath sounds normal. No respiratory distress.  Abdominal: Soft. He exhibits no distension and no mass. There is no tenderness. Hernia confirmed negative in the right inguinal area and confirmed negative in the left inguinal area.  Genitourinary: Prostate normal and penis normal. No penile tenderness.       Perianal skin clear.  L lat & R ant prolapsed ext hemorrhoids.  Mildly inflammed R posterior int hemorrhoid  Musculoskeletal: Normal range of motion. He exhibits no tenderness.       Muscular body.  Scars on B arm/fossae & L knee c/w prior surgeries  Lymphadenopathy:    He has no cervical  adenopathy.       Right: No inguinal adenopathy present.       Left: No inguinal adenopathy present.  Neurological: He is alert and oriented to person, place, and time. No cranial nerve deficit. He exhibits normal muscle tone. Coordination normal.  Skin: Skin is warm and dry. No rash noted. He is not diaphoretic. No erythema. No pallor.  Psychiatric: He has a normal mood and affect. His behavior is normal. Judgment and thought content normal.       Assessment:     External hemorrhoids/prolapsing internal hemorrhoids. Occasional bleeding. Occasional flares. Borderline diarrhea    Plan:     The anatomy & physiology of the anorectal region was discussed.  The pathophysiology of hemorrhoids and differential diagnosis was discussed.  Natural history risks without surgery was discussed.  I stressed the importance of a bowel regimen to have daily soft bowel movements to minimize progression of disease.  Consider Pepto/Bismuth/Immodium to slow BMs down.  Interventions such as sclerotherapy & banding were discussed.  Given that his main complaint is the prolapsing/external hemorrhoids, I do not think injecting or banding will help very much. He is not severely bleeding right now. I did discuss possibility of doing external hemorrhoidectomies. He may be benefit of the [facility] ligation and pexy with his bleeding problems as well.  Risks such as bleeding, infection, need for further treatment, heart attack, death, and other risks were discussed.  Goals of post-operative recovery were discussed as well.  Possibility that this will not correct all symptoms was explained.  Post-operative pain, bleeding, constipation, and other problems after surgery were discussed.  We will work to minimize complications.   Educational handouts further explaining the pathology, treatment options, and bowel regimen were given as well.  Questions were answered.  The patient expresses understanding & wishes to try a better bowel regimen  and better hygiene and see if the hemorrhoids will not become too bothersome. If he changes his mind, he will call let me know.

## 2011-01-01 ENCOUNTER — Encounter (HOSPITAL_BASED_OUTPATIENT_CLINIC_OR_DEPARTMENT_OTHER): Payer: Self-pay | Admitting: *Deleted

## 2011-01-01 NOTE — Progress Notes (Signed)
To take meds-bring crutches and meds-

## 2011-01-02 ENCOUNTER — Encounter (HOSPITAL_BASED_OUTPATIENT_CLINIC_OR_DEPARTMENT_OTHER): Payer: Self-pay | Admitting: Anesthesiology

## 2011-01-02 ENCOUNTER — Other Ambulatory Visit: Payer: Self-pay | Admitting: Physician Assistant

## 2011-01-02 ENCOUNTER — Ambulatory Visit (HOSPITAL_BASED_OUTPATIENT_CLINIC_OR_DEPARTMENT_OTHER)
Admission: RE | Admit: 2011-01-02 | Discharge: 2011-01-02 | Disposition: A | Discharge status: Home / Self Care | Payer: PRIVATE HEALTH INSURANCE | Source: Ambulatory Visit | Attending: Orthopedic Surgery | Admitting: Orthopedic Surgery

## 2011-01-02 ENCOUNTER — Encounter (HOSPITAL_BASED_OUTPATIENT_CLINIC_OR_DEPARTMENT_OTHER): Payer: Self-pay | Admitting: *Deleted

## 2011-01-02 ENCOUNTER — Ambulatory Visit (HOSPITAL_BASED_OUTPATIENT_CLINIC_OR_DEPARTMENT_OTHER): Payer: PRIVATE HEALTH INSURANCE | Admitting: Anesthesiology

## 2011-01-02 ENCOUNTER — Encounter (HOSPITAL_COMMUNITY): Payer: Self-pay

## 2011-01-02 ENCOUNTER — Encounter (HOSPITAL_BASED_OUTPATIENT_CLINIC_OR_DEPARTMENT_OTHER)
Admission: RE | Disposition: A | Discharge status: Home / Self Care | Payer: Self-pay | Source: Ambulatory Visit | Attending: Orthopedic Surgery

## 2011-01-02 ENCOUNTER — Other Ambulatory Visit: Payer: Self-pay

## 2011-01-02 ENCOUNTER — Inpatient Hospital Stay (HOSPITAL_COMMUNITY)
Admission: EM | Admit: 2011-01-02 | Discharge: 2011-01-04 | DRG: 247 | Disposition: A | Discharge status: Home / Self Care | Payer: PRIVATE HEALTH INSURANCE | Attending: Cardiology | Admitting: Cardiology

## 2011-01-02 ENCOUNTER — Encounter: Payer: Self-pay | Admitting: Physician Assistant

## 2011-01-02 DIAGNOSIS — Y9210 Unspecified residential institution as the place of occurrence of the external cause: Secondary | ICD-10-CM | POA: Insufficient documentation

## 2011-01-02 DIAGNOSIS — I472 Ventricular tachycardia, unspecified: Principal | ICD-10-CM | POA: Diagnosis present

## 2011-01-02 DIAGNOSIS — M659 Unspecified synovitis and tenosynovitis, unspecified site: Secondary | ICD-10-CM | POA: Diagnosis present

## 2011-01-02 DIAGNOSIS — I10 Essential (primary) hypertension: Secondary | ICD-10-CM | POA: Insufficient documentation

## 2011-01-02 DIAGNOSIS — I4729 Other ventricular tachycardia: Secondary | ICD-10-CM

## 2011-01-02 DIAGNOSIS — M2240 Chondromalacia patellae, unspecified knee: Secondary | ICD-10-CM | POA: Diagnosis present

## 2011-01-02 DIAGNOSIS — R000 Tachycardia, unspecified: Secondary | ICD-10-CM | POA: Insufficient documentation

## 2011-01-02 DIAGNOSIS — E785 Hyperlipidemia, unspecified: Secondary | ICD-10-CM | POA: Diagnosis present

## 2011-01-02 DIAGNOSIS — I4892 Unspecified atrial flutter: Secondary | ICD-10-CM | POA: Diagnosis present

## 2011-01-02 DIAGNOSIS — Z955 Presence of coronary angioplasty implant and graft: Secondary | ICD-10-CM

## 2011-01-02 DIAGNOSIS — K5730 Diverticulosis of large intestine without perforation or abscess without bleeding: Secondary | ICD-10-CM | POA: Diagnosis present

## 2011-01-02 DIAGNOSIS — M23329 Other meniscus derangements, posterior horn of medial meniscus, unspecified knee: Secondary | ICD-10-CM | POA: Insufficient documentation

## 2011-01-02 DIAGNOSIS — K219 Gastro-esophageal reflux disease without esophagitis: Secondary | ICD-10-CM | POA: Insufficient documentation

## 2011-01-02 DIAGNOSIS — K644 Residual hemorrhoidal skin tags: Secondary | ICD-10-CM | POA: Diagnosis present

## 2011-01-02 DIAGNOSIS — IMO0002 Reserved for concepts with insufficient information to code with codable children: Secondary | ICD-10-CM | POA: Diagnosis present

## 2011-01-02 DIAGNOSIS — F411 Generalized anxiety disorder: Secondary | ICD-10-CM | POA: Diagnosis present

## 2011-01-02 DIAGNOSIS — K649 Unspecified hemorrhoids: Secondary | ICD-10-CM

## 2011-01-02 DIAGNOSIS — X58XXXA Exposure to other specified factors, initial encounter: Secondary | ICD-10-CM | POA: Diagnosis present

## 2011-01-02 DIAGNOSIS — S83209A Unspecified tear of unspecified meniscus, current injury, unspecified knee, initial encounter: Secondary | ICD-10-CM

## 2011-01-02 DIAGNOSIS — I2510 Atherosclerotic heart disease of native coronary artery without angina pectoris: Secondary | ICD-10-CM | POA: Diagnosis present

## 2011-01-02 DIAGNOSIS — M23302 Other meniscus derangements, unspecified lateral meniscus, unspecified knee: Secondary | ICD-10-CM | POA: Insufficient documentation

## 2011-01-02 DIAGNOSIS — Z9889 Other specified postprocedural states: Secondary | ICD-10-CM

## 2011-01-02 DIAGNOSIS — I519 Heart disease, unspecified: Secondary | ICD-10-CM | POA: Insufficient documentation

## 2011-01-02 DIAGNOSIS — Y838 Other surgical procedures as the cause of abnormal reaction of the patient, or of later complication, without mention of misadventure at the time of the procedure: Secondary | ICD-10-CM | POA: Insufficient documentation

## 2011-01-02 DIAGNOSIS — M25561 Pain in right knee: Secondary | ICD-10-CM

## 2011-01-02 HISTORY — DX: Diverticulosis of intestine, part unspecified, without perforation or abscess without bleeding: K57.90

## 2011-01-02 HISTORY — DX: Hyperlipidemia, unspecified: E78.5

## 2011-01-02 HISTORY — DX: Other specified postprocedural states: Z98.890

## 2011-01-02 HISTORY — DX: Unspecified atrial flutter: I48.92

## 2011-01-02 HISTORY — DX: Other ventricular tachycardia: I47.29

## 2011-01-02 HISTORY — DX: Ventricular tachycardia: I47.2

## 2011-01-02 HISTORY — DX: Presence of coronary angioplasty implant and graft: Z95.5

## 2011-01-02 HISTORY — DX: Atherosclerotic heart disease of native coronary artery without angina pectoris: I25.10

## 2011-01-02 LAB — COMPREHENSIVE METABOLIC PANEL
ALT: 40 U/L (ref 0–53)
Calcium: 9.6 mg/dL (ref 8.4–10.5)
Creatinine, Ser: 1.11 mg/dL (ref 0.50–1.35)
GFR calc Af Amer: 85 mL/min — ABNORMAL LOW (ref >90)
Glucose, Bld: 125 mg/dL — ABNORMAL HIGH (ref 70–99)
Sodium: 135 mEq/L (ref 135–145)
Total Protein: 7.2 g/dL (ref 6.0–8.3)

## 2011-01-02 LAB — POCT I-STAT, CHEM 8
BUN: 25 mg/dL — ABNORMAL HIGH (ref 6–23)
Calcium, Ion: 1.09 mmol/L — ABNORMAL LOW (ref 1.12–1.32)
Chloride: 104 mEq/L (ref 96–112)
Glucose, Bld: 93 mg/dL (ref 70–99)
TCO2: 24 mmol/L (ref 0–100)

## 2011-01-02 LAB — CBC
HCT: 45.8 % (ref 39.0–52.0)
MCH: 33.9 pg (ref 26.0–34.0)
MCHC: 35.6 g/dL (ref 30.0–36.0)
MCV: 95.2 fL (ref 78.0–100.0)
Platelets: 191 10*3/uL (ref 150–400)
RDW: 12.8 % (ref 11.5–15.5)

## 2011-01-02 LAB — CARDIAC PANEL(CRET KIN+CKTOT+MB+TROPI)
CK, MB: 4.1 ng/mL — ABNORMAL HIGH (ref 0.3–4.0)
Relative Index: 2.6 — ABNORMAL HIGH (ref 0.0–2.5)
Troponin I: <0.3 ng/mL (ref <0.30)

## 2011-01-02 LAB — DIFFERENTIAL
Basophils Absolute: 0 10*3/uL (ref 0.0–0.1)
Eosinophils Absolute: 0 10*3/uL (ref 0.0–0.7)
Eosinophils Relative: 0 % (ref 0–5)
Lymphs Abs: 1.1 10*3/uL (ref 0.7–4.0)

## 2011-01-02 SURGERY — ARTHROSCOPY, KNEE, WITH MEDIAL MENISCECTOMY
Anesthesia: General | Site: Knee | Laterality: Right | Wound class: Clean

## 2011-01-02 MED ORDER — OLMESARTAN MEDOXOMIL 40 MG PO TABS: 40.0000 mg | ORAL_TABLET | Freq: Every day | ORAL | Status: DC

## 2011-01-02 MED ORDER — LIDOCAINE HCL (CARDIAC) 20 MG/ML IV SOLN
INTRAVENOUS | Status: DC | PRN
  Administered 2011-01-02: 60 mg via INTRAVENOUS
  Administered 2011-01-02: 100 mg via INTRAVENOUS

## 2011-01-02 MED ORDER — DILTIAZEM HCL ER BEADS 300 MG PO CP24
420.0000 mg | ORAL_CAPSULE | Freq: Every day | ORAL | Status: DC
  Filled 2011-01-02: qty 1

## 2011-01-02 MED ORDER — SODIUM CHLORIDE 0.9 % IJ SOLN
INTRAMUSCULAR | Status: DC | PRN
  Administered 2011-01-02 (×2)

## 2011-01-02 MED ORDER — NITROGLYCERIN 0.4 MG SL SUBL: 0.4000 mg | SUBLINGUAL_TABLET | SUBLINGUAL | Status: DC | PRN

## 2011-01-02 MED ORDER — PROPOFOL 10 MG/ML IV EMUL
INTRAVENOUS | Status: DC | PRN
  Administered 2011-01-02: 280 mg via INTRAVENOUS

## 2011-01-02 MED ORDER — ASPIRIN 81 MG PO CHEW
324.0000 mg | CHEWABLE_TABLET | ORAL | Status: AC
  Administered 2011-01-02: 324 mg via ORAL
  Filled 2011-01-02: qty 4

## 2011-01-02 MED ORDER — FENTANYL CITRATE 0.05 MG/ML IJ SOLN
INTRAMUSCULAR | Status: DC | PRN
  Administered 2011-01-02: 100 ug via INTRAVENOUS

## 2011-01-02 MED ORDER — OXYCODONE-ACETAMINOPHEN 5-325 MG PO TABS: 1.0000 | ORAL_TABLET | ORAL | Status: DC | PRN

## 2011-01-02 MED ORDER — PROMETHAZINE HCL 25 MG/ML IJ SOLN: 6.2500 mg | INTRAMUSCULAR | Status: DC | PRN

## 2011-01-02 MED ORDER — ROSUVASTATIN CALCIUM 20 MG PO TABS: 20.0000 mg | ORAL_TABLET | Freq: Every day | ORAL | Status: DC

## 2011-01-02 MED ORDER — BUPIVACAINE-EPINEPHRINE PF 0.5-1:200000 % IJ SOLN
INTRAMUSCULAR | Status: DC | PRN
  Administered 2011-01-02: 15 mL

## 2011-01-02 MED ORDER — LACTATED RINGERS IV SOLN
INTRAVENOUS | Status: DC
  Administered 2011-01-02 (×2): via INTRAVENOUS

## 2011-01-02 MED ORDER — ALPRAZOLAM 0.5 MG PO TABS
1.0000 mg | ORAL_TABLET | Freq: Three times a day (TID) | ORAL | Status: DC | PRN
  Administered 2011-01-03 – 2011-01-04 (×2): 0.5 mg via ORAL
  Filled 2011-01-02 (×2): qty 1

## 2011-01-02 MED ORDER — OLMESARTAN MEDOXOMIL 40 MG PO TABS
40.0000 mg | ORAL_TABLET | Freq: Every day | ORAL | Status: DC
Start: 2011-01-03 — End: 2011-01-04
  Administered 2011-01-03 – 2011-01-04 (×2): 40 mg via ORAL
  Filled 2011-01-02 (×2): qty 1

## 2011-01-02 MED ORDER — HYDROCORTISONE ACETATE 25 MG RE SUPP
25.0000 mg | Freq: Two times a day (BID) | RECTAL | Status: DC | PRN
  Filled 2011-01-02: qty 1

## 2011-01-02 MED ORDER — PANTOPRAZOLE SODIUM 40 MG PO TBEC: 80.0000 mg | DELAYED_RELEASE_TABLET | Freq: Every day | ORAL | Status: DC

## 2011-01-02 MED ORDER — CEFAZOLIN SODIUM-DEXTROSE 2-3 GM-% IV SOLR: 2.0000 g | INTRAVENOUS | Status: DC

## 2011-01-02 MED ORDER — DILTIAZEM HCL ER COATED BEADS 300 MG PO CP24: 420.0000 mg | ORAL_CAPSULE | Freq: Every day | ORAL | Status: DC

## 2011-01-02 MED ORDER — ASPIRIN 81 MG PO CHEW
324.0000 mg | CHEWABLE_TABLET | ORAL | Status: AC
  Administered 2011-01-03: 324 mg via ORAL
  Filled 2011-01-02: qty 4

## 2011-01-02 MED ORDER — FENOFIBRATE 160 MG PO TABS: 160.0000 mg | ORAL_TABLET | Freq: Every day | ORAL | Status: DC

## 2011-01-02 MED ORDER — OXYCODONE-ACETAMINOPHEN 5-325 MG PO TABS
ORAL_TABLET | ORAL | Status: AC
  Filled 2011-01-02: qty 1

## 2011-01-02 MED ORDER — SODIUM CHLORIDE 0.9 % IV SOLN
250.0000 mL | INTRAVENOUS | Status: DC
  Administered 2011-01-03: 1000 mL via INTRAVENOUS
  Administered 2011-01-03: 250 mL via INTRAVENOUS

## 2011-01-02 MED ORDER — BUPROPION HCL ER (XL) 300 MG PO TB24
300.0000 mg | ORAL_TABLET | Freq: Every day | ORAL | Status: DC
  Administered 2011-01-03 – 2011-01-04 (×2): 300 mg via ORAL
  Filled 2011-01-02 (×2): qty 1

## 2011-01-02 MED ORDER — FUROSEMIDE 80 MG PO TABS: 80.0000 mg | ORAL_TABLET | Freq: Every day | ORAL | Status: DC

## 2011-01-02 MED ORDER — MIDAZOLAM HCL 2 MG/2ML IJ SOLN
1.0000 mg | INTRAMUSCULAR | Status: DC | PRN
  Administered 2011-01-02: 2 mg via INTRAVENOUS

## 2011-01-02 MED ORDER — FENOFIBRATE 160 MG PO TABS
160.0000 mg | ORAL_TABLET | Freq: Every day | ORAL | Status: DC
  Administered 2011-01-03 – 2011-01-04 (×2): 160 mg via ORAL
  Filled 2011-01-02 (×2): qty 1

## 2011-01-02 MED ORDER — ONDANSETRON HCL 4 MG/2ML IJ SOLN: 4.0000 mg | Freq: Four times a day (QID) | INTRAMUSCULAR | Status: DC | PRN

## 2011-01-02 MED ORDER — ALPRAZOLAM 1 MG PO TABS: 1.0000 mg | ORAL_TABLET | Freq: Every evening | ORAL | Status: DC | PRN

## 2011-01-02 MED ORDER — ACETAMINOPHEN 325 MG PO TABS: 650.0000 mg | ORAL_TABLET | ORAL | Status: DC | PRN

## 2011-01-02 MED ORDER — DILTIAZEM HCL ER COATED BEADS 120 MG PO CP24
120.0000 mg | ORAL_CAPSULE | Freq: Every day | ORAL | Status: DC
  Administered 2011-01-03: 120 mg via ORAL
  Filled 2011-01-02: qty 1

## 2011-01-02 MED ORDER — ATENOLOL 100 MG PO TABS: 100.0000 mg | ORAL_TABLET | Freq: Every day | ORAL | Status: DC

## 2011-01-02 MED ORDER — LIDOCAINE-EPINEPHRINE 1.5-1:200000 % IJ SOLN
INTRAMUSCULAR | Status: DC | PRN
  Administered 2011-01-02: 15 mL via INTRADERMAL

## 2011-01-02 MED ORDER — FENTANYL CITRATE 0.05 MG/ML IJ SOLN
50.0000 ug | INTRAMUSCULAR | Status: DC | PRN
  Administered 2011-01-02: 100 ug via INTRAVENOUS

## 2011-01-02 MED ORDER — TESTOSTERONE CYPIONATE 200 MG/ML IM SOLN: 200.0000 mg | INTRAMUSCULAR | Status: DC

## 2011-01-02 MED ORDER — BUPROPION HCL ER (XL) 300 MG PO TB24: 300.0000 mg | ORAL_TABLET | Freq: Every day | ORAL | Status: DC

## 2011-01-02 MED ORDER — ONDANSETRON HCL 4 MG/2ML IJ SOLN
INTRAMUSCULAR | Status: DC | PRN
  Administered 2011-01-02: 4 mg via INTRAVENOUS

## 2011-01-02 MED ORDER — DILTIAZEM HCL ER BEADS 120 MG PO CP24: 420.0000 mg | ORAL_CAPSULE | Freq: Every day | ORAL | Status: DC

## 2011-01-02 MED ORDER — CHLORHEXIDINE GLUCONATE 4 % EX LIQD: 60.0000 mL | Freq: Once | CUTANEOUS | Status: DC

## 2011-01-02 MED ORDER — ATENOLOL 100 MG PO TABS
100.0000 mg | ORAL_TABLET | Freq: Every day | ORAL | Status: DC
  Administered 2011-01-03 – 2011-01-04 (×2): 100 mg via ORAL
  Filled 2011-01-02 (×2): qty 1

## 2011-01-02 MED ORDER — HYDROCORTISONE ACETATE 25 MG RE SUPP: 25.0000 mg | Freq: Two times a day (BID) | RECTAL | Status: DC

## 2011-01-02 MED ORDER — ALPRAZOLAM 0.5 MG PO TABS
ORAL_TABLET | ORAL | Status: AC
  Administered 2011-01-02: 21:00:00 via ORAL
  Filled 2011-01-02: qty 2

## 2011-01-02 MED ORDER — DEXAMETHASONE SODIUM PHOSPHATE 4 MG/ML IJ SOLN
INTRAMUSCULAR | Status: DC | PRN
  Administered 2011-01-02: 10 mg via INTRAVENOUS

## 2011-01-02 MED ORDER — ROSUVASTATIN CALCIUM 20 MG PO TABS
20.0000 mg | ORAL_TABLET | Freq: Every day | ORAL | Status: DC
  Administered 2011-01-03 – 2011-01-04 (×2): 20 mg via ORAL
  Filled 2011-01-02 (×2): qty 1

## 2011-01-02 MED ORDER — HYDROMORPHONE HCL PF 1 MG/ML IJ SOLN: 0.2500 mg | INTRAMUSCULAR | Status: DC | PRN

## 2011-01-02 MED ORDER — OXYCODONE-ACETAMINOPHEN 5-325 MG PO TABS
1.0000 | ORAL_TABLET | ORAL | Status: DC | PRN
  Administered 2011-01-02: 21:00:00 via ORAL
  Administered 2011-01-03 – 2011-01-04 (×6): 1 via ORAL
  Filled 2011-01-02 (×6): qty 1

## 2011-01-02 MED ORDER — DILTIAZEM HCL ER COATED BEADS 300 MG PO CP24
300.0000 mg | ORAL_CAPSULE | Freq: Every day | ORAL | Status: DC
  Administered 2011-01-03: 300 mg via ORAL
  Filled 2011-01-02: qty 1

## 2011-01-02 MED ORDER — PANTOPRAZOLE SODIUM 40 MG PO TBEC
40.0000 mg | DELAYED_RELEASE_TABLET | Freq: Every day | ORAL | Status: DC
  Administered 2011-01-03: 40 mg via ORAL
  Filled 2011-01-02: qty 1

## 2011-01-02 SURGICAL SUPPLY — 25 items
BANDAGE ELASTIC 6 VELCRO ST LF (GAUZE/BANDAGES/DRESSINGS) ×2 IMPLANT
BLADE GREAT WHITE 4.2 (BLADE) ×2 IMPLANT
CANISTER OMNI JUG 16 LITER (MISCELLANEOUS) ×2 IMPLANT
DRAPE ARTHROSCOPY W/POUCH 90 (DRAPES) ×2 IMPLANT
DRSG PAD ABDOMINAL 8X10 ST (GAUZE/BANDAGES/DRESSINGS) ×2 IMPLANT
DURAPREP 26ML APPLICATOR (WOUND CARE) ×2 IMPLANT
GAUZE SPONGE 4X4 12PLY STRL LF (GAUZE/BANDAGES/DRESSINGS) ×2 IMPLANT
GAUZE XEROFORM 1X8 LF (GAUZE/BANDAGES/DRESSINGS) ×2 IMPLANT
GLOVE BIO SURGEON STRL SZ7 (GLOVE) ×6 IMPLANT
GLOVE BIOGEL PI IND STRL 7.5 (GLOVE) ×1 IMPLANT
GLOVE BIOGEL PI INDICATOR 7.5 (GLOVE) ×1
GOWN PREVENTION PLUS XLARGE (GOWN DISPOSABLE) ×2 IMPLANT
GOWN PREVENTION PLUS XXLARGE (GOWN DISPOSABLE) ×2 IMPLANT
HOLDER KNEE FOAM BLUE (MISCELLANEOUS) ×2 IMPLANT
KNEE WRAP E Z 3 GEL PACK (MISCELLANEOUS) ×2 IMPLANT
NDL SAFETY ECLIPSE 18X1.5 (NEEDLE) ×1 IMPLANT
NEEDLE HYPO 18GX1.5 SHARP (NEEDLE) ×2
PACK ARTHROSCOPY DSU (CUSTOM PROCEDURE TRAY) ×2 IMPLANT
PAD CAST 4YDX4 CTTN HI CHSV (CAST SUPPLIES) ×1 IMPLANT
PADDING CAST COTTON 4X4 STRL (CAST SUPPLIES) ×2
SET ARTHROSCOPY TUBING (MISCELLANEOUS) ×2
SET ARTHROSCOPY TUBING LN (MISCELLANEOUS) ×1 IMPLANT
SPONGE GAUZE 4X4 12PLY (GAUZE/BANDAGES/DRESSINGS) ×2 IMPLANT
TOWEL OR 17X24 6PK STRL BLUE (TOWEL DISPOSABLE) ×2 IMPLANT
WATER STERILE IRR 1000ML POUR (IV SOLUTION) ×2 IMPLANT

## 2011-01-02 NOTE — Anesthesia Preprocedure Evaluation (Signed)
Anesthesia Evaluation  Patient identified by MRN, date of birth, ID band Patient awake    Airway Mallampati: II TM Distance: >3 FB Neck ROM: Full    Dental   Pulmonary    Pulmonary exam normal       Cardiovascular hypertension,     Neuro/Psych    GI/Hepatic GERD-  ,  Endo/Other    Renal/GU      Musculoskeletal   Abdominal   Peds  Hematology   Anesthesia Other Findings   Reproductive/Obstetrics                           Anesthesia Physical Anesthesia Plan  ASA: II  Anesthesia Plan: General   Post-op Pain Management:    Induction: Intravenous  Airway Management Planned: LMA  Additional Equipment:   Intra-op Plan:   Post-operative Plan: Extubation in OR  Informed Consent: I have reviewed the patients History and Physical, chart, labs and discussed the procedure including the risks, benefits and alternatives for the proposed anesthesia with the patient or authorized representative who has indicated his/her understanding and acceptance.     Plan Discussed with: CRNA and Surgeon  Anesthesia Plan Comments:         Anesthesia Quick Evaluation

## 2011-01-02 NOTE — Progress Notes (Signed)
Medical screening exam: A 56 year old male who apparently had an arrhythmia during surgery for her knee arthroscopy today. He denies any chest pain. Vital signs are normal. In the emergency department, I did monitor has shown normal sinus rhythm. He is scheduled to be admitted by [facility].

## 2011-01-02 NOTE — H&P (Signed)
Marvin Carter is an 55 y.o. male.   Chief Complaint: Chest Pain HPI:  Marvin Carter is a 55 year old Caucasian male with a history of hypertension hyperlipidemia diverticulosis hemorrhoids gastroesophageal reflux disease meniscal tear anxiety right knee pain. Here cardiac catheterization in 04/22/2003 which showed mild irregularities in the mid LAD had normal systolic function at the time.  He had subsequent nuclear stress test at the request of Dr. Oneta Rack in 2009. This revealed the evidence of mild ischemia in the mid antero-lateral and apical lateral regions. Post-rest ejection fraction was 66%. The patient subsequently followed up with Dr. Oneta Rack. The patient presented for surgery today on his meniscal tear to his right lower extremity. Again procedure patient currently had a 30-45 second run of V. Tach which was not caught on the monitor however subsequent to the V. Tach he had a run of atrial flutter. He was then sent to come ED for evaluation. He denies any recent chest pain shortness of breath nausea vomiting diaphoresis dyspnea on exertion. Patient states he goes hunting hiking up hills and so on without any chest pain.  Past Medical History  Diagnosis Date  . Hypertension   . Hyperlipidemia   . Diverticulitis   . Hemorrhoids   . GERD (gastroesophageal reflux disease)   . Anxiety   . Meniscus tear 01/02/2011  . Right knee pain 01/02/2011  . Diverticulosis     Past Surgical History  Procedure Date  . Appendectomy   . Knee arthroscopy     x 8 left  . Rotator cuff repair     bilateral  . Hand surgery     right  . Bicept tendon repair     bilateral  . Tonsillectomy   . Cholecystectomy     Family History  Problem Relation Age of Onset  . Diabetes Mother   . Heart disease Mother   . Heart attack Mother   . Heart disease Father   . Coronary artery disease Father    Social History:  reports that he has never smoked. He has never used smokeless tobacco. He reports that he  drinks alcohol. He reports that he does not use illicit drugs.  Allergies: No Known Allergies  Medications Prior to Admission  Medication Dose Route Frequency Provider Last Rate Last Dose  . DISCONTD: 1 mL EPINEPHrine 1 mg/mL (1:1000) in 0.9% Normal Saline (10 mL) irrigation    PRN Nilda Simmer, MD      . DISCONTD: ALPRAZolam Prudy Feeler) tablet 1 mg  1 mg Oral QHS PRN Kirstin Tama Headings, PA      . DISCONTD: atenolol (TENORMIN) tablet 100 mg  100 mg Oral Daily Kirstin J Shepperson, PA      . DISCONTD: Bupivacaine-Epinephrine PF (MARCAINE W/ EPI (PF)) 0.5-1:200000 % injection    PRN Bedelia Person, MD   15 mL at 01/02/11 1420  . DISCONTD: buPROPion (WELLBUTRIN XL) 24 hr tablet 300 mg  300 mg Oral Daily Kirstin J Shepperson, PA      . DISCONTD: ceFAZolin (ANCEF) IVPB 2 g/50 mL premix  2 g Intravenous 60 min Pre-Op Kirstin J Shepperson, PA      . DISCONTD: chlorhexidine (HIBICLENS) 4 % liquid 4 application  60 mL Topical Once Kirstin J Shepperson, PA      . DISCONTD: dexamethasone (DECADRON) injection    PRN Sharyne Richters, CRNA   10 mg at 01/02/11 1529  . DISCONTD: diltiazem (CARDIZEM CD) 24 hr capsule 420 mg  420 mg Oral Daily Kirstin J Shepperson,  PA      . DISCONTD: fenofibrate tablet 160 mg  160 mg Oral Daily Kirstin J Shepperson, PA      . DISCONTD: fentaNYL (SUBLIMAZE) injection 50-100 mcg  50-100 mcg Intravenous PRN Bedelia Person, MD   100 mcg at 01/02/11 1416  . DISCONTD: fentaNYL (SUBLIMAZE) injection    PRN Sharyne Richters, CRNA   100 mcg at 01/02/11 1515  . DISCONTD: furosemide (LASIX) tablet 80 mg  80 mg Oral Daily Kirstin J Shepperson, PA      . DISCONTD: hydrocortisone (ANUSOL-HC) suppository 25 mg  25 mg Rectal Q12H Kirstin J Shepperson, PA      . DISCONTD: HYDROmorphone (DILAUDID) injection 0.25-0.5 mg  0.25-0.5 mg Intravenous Q5 min PRN Bedelia Person, MD      . DISCONTD: lactated ringers infusion   Intravenous Continuous Zenon Mayo, MD 20 mL/hr at 01/02/11 1632    . DISCONTD: lidocaine  (cardiac) 100 mg/31ml (XYLOCAINE) 20 MG/ML injection 2%    PRN Sharyne Richters, CRNA   100 mg at 01/02/11 1530  . DISCONTD: lidocaine-EPINEPHrine 1.5-1:200000 % injection    PRN Bedelia Person, MD   15 mL at 01/02/11 1420  . DISCONTD: midazolam (VERSED) injection 1-2 mg  1-2 mg Intravenous PRN Bedelia Person, MD   2 mg at 01/02/11 1416  . DISCONTD: olmesartan (BENICAR) tablet 40 mg  40 mg Oral Daily Kirstin J Shepperson, PA      . DISCONTD: ondansetron (ZOFRAN) injection    PRN Sharyne Richters, CRNA   4 mg at 01/02/11 1515  . DISCONTD: pantoprazole (PROTONIX) EC tablet 80 mg  80 mg Oral Q1200 Kirstin J Shepperson, PA      . DISCONTD: promethazine (PHENERGAN) injection 6.25-12.5 mg  6.25-12.5 mg Intravenous Q15 min PRN Bedelia Person, MD      . DISCONTD: propofol (DIPRIVAN) 10 MG/ML infusion    PRN Sharyne Richters, CRNA   280 mg at 01/02/11 1523  . DISCONTD: rosuvastatin (CRESTOR) tablet 20 mg  20 mg Oral Daily Kirstin J Shepperson, PA      . DISCONTD: testosterone cypionate (DEPOTESTOTERONE CYPIONATE) injection 200 mg  200 mg Intramuscular Q14 Days Kirstin J Shepperson, PA       Medications Prior to Admission  Medication Sig Dispense Refill  . ALPRAZolam (XANAX) 1 MG tablet Take 1 mg by mouth 3 (three) times daily as needed. For anxiety      . atenolol (TENORMIN) 100 MG tablet Take 100 mg by mouth daily.       Marland Kitchen atorvastatin (LIPITOR) 40 MG tablet Take 20 mg by mouth daily.       Marland Kitchen buPROPion (WELLBUTRIN XL) 300 MG 24 hr tablet Take 300 mg by mouth daily.       Marland Kitchen esomeprazole (NEXIUM) 40 MG capsule Take 40 mg by mouth daily before breakfast.       . fenofibrate micronized (LOFIBRA) 134 MG capsule Take 134 mg by mouth daily before breakfast.       . furosemide (LASIX) 80 MG tablet Take 80 mg by mouth daily.        . hydrocortisone (ANUSOL-HC) 25 MG suppository Place 25 mg rectally 2 (two) times daily as needed. For discomfort       . oxyCODONE-acetaminophen (ROXICET) 5-325 MG per tablet Take 1 tablet by mouth every 4  (four) hours as needed for pain.  40 tablet  0  . testosterone cypionate (DEPOTESTOTERONE CYPIONATE) 200 MG/ML injection Inject 200 mg into the muscle every 28 (twenty-eight) days.       Marland Kitchen  VOLTAREN 1 % GEL Apply 1 application topically 4 (four) times daily as needed. For pain to affected area        Results for orders placed during the hospital encounter of 01/02/11 (from the past 48 hour(s))  POCT I-STAT, CHEM 8     Status: Abnormal   Collection Time   01/02/11  1:52 PM      Component Value Range Comment   Sodium 139  135 - 145 (mEq/L)    Potassium 3.8  3.5 - 5.1 (mEq/L)    Chloride 104  96 - 112 (mEq/L)    BUN 25 (*) 6 - 23 (mg/dL)    Creatinine, Ser 4.09  0.50 - 1.35 (mg/dL)    Glucose, Bld 93  70 - 99 (mg/dL)    Calcium, Ion 8.11 (*) 1.12 - 1.32 (mmol/L)    TCO2 24  0 - 100 (mmol/L)    Hemoglobin 17.7 (*) 13.0 - 17.0 (g/dL)    HCT 91.4  78.2 - 95.6 (%)    No results found.  Review of Systems  Constitutional: Negative for fever, chills and diaphoresis.  Respiratory: Negative for shortness of breath.   Cardiovascular: Negative for chest pain and palpitations.  Gastrointestinal: Negative for nausea and vomiting.  Neurological: Negative for weakness.    Blood pressure 148/91, pulse 72, temperature 98.4 F (36.9 C), temperature source Oral, resp. rate 14, SpO2 97.00%. Physical Exam  Constitutional: He appears well-developed and well-nourished.  HENT:  Head: Normocephalic and atraumatic.  Eyes: EOM are normal. Pupils are equal, round, and reactive to light. No scleral icterus.  Cardiovascular: Normal rate and regular rhythm.   No murmur heard. Pulses:      Radial pulses are 2+ on the right side, and 2+ on the left side.       Dorsalis pedis pulses are 2+ on the right side, and 2+ on the left side.  Respiratory: Effort normal and breath sounds normal.  Musculoskeletal: He exhibits no edema.     Assessment/Plan Imp: 1.Vtach, Suspected. 2. CAD.  Mild luminal  irregularities of 30% in LAD by cath 2005.   mild ischemia in the mid antero-lateral and  apical lateral regions 3. HLD 4. HTN  The patient will be admitted to a telemetry unit where we will monitor heart rate and blood pressure.  Heart healthy diet. He'll be n.p.o. after midnight for left heart catheterization tomorrow. We'll continue all of appropriate home medications.  HAGER,BRYAN W 01/02/2011, 6:41 PM  Agree with note written by [facility]  Nanetta Batty J 01/03/2011 11:25 AM   Pt with + CRF, mildly abnormal myoview several yrs ago now admitted with NSVT. Plan cath to r/o CAD. Pt and family agreeable

## 2011-01-02 NOTE — ED Notes (Signed)
Patient here from surgical center for 45 second run of vtach while under general anesthesia followed by a flutter and now in nsr, no complaints noted. Pt was having surgery for arthroscopic knee. Now hr in 70's

## 2011-01-02 NOTE — H&P (Signed)
Marvin Carter is an 55 y.o. male.   Chief Complaint: right knee pain HPI: 55 yowm with history of right knee pain.  + mcmurray's and MRI documenting torn meniscus.  The risks, benefits, and possible complications of the procedure were discussed in detail with the patient.  The patient is without question.  Past Medical History  Diagnosis Date  . Hypertension   . Hyperlipidemia   . Diverticulitis   . Hemorrhoids   . GERD (gastroesophageal reflux disease)   . Anxiety     Past Surgical History  Procedure Date  . Appendectomy   . Knee arthroscopy     x 8 left  . Rotator cuff repair     bilateral  . Hand surgery     right  . Bicept tendon repair     bilateral  . Tonsillectomy   . Cholecystectomy     Family History  Problem Relation Age of Onset  . Diabetes Mother   . Heart disease Mother   . Heart disease Father    Social History:  reports that he has never smoked. He has never used smokeless tobacco. He reports that he drinks alcohol. He reports that he does not use illicit drugs.  Allergies: No Known Allergies  Medications Prior to Admission  Medication Dose Route Frequency Provider Last Rate Last Dose  . Bupivacaine-Epinephrine PF (MARCAINE W/ EPI (PF)) 0.5-1:200000 % injection    PRN Lee Kasik, MD   15 mL at 01/02/11 1420  . fentaNYL (SUBLIMAZE) injection 50-100 mcg  50-100 mcg Intravenous PRN Lee Kasik, MD   100 mcg at 01/02/11 1416  . lactated ringers infusion   Intravenous Continuous W. Edmond Fitzgerald, MD 20 mL/hr at 01/02/11 1406    . lidocaine-EPINEPHrine 1.5-1:200000 % injection    PRN Lee Kasik, MD   15 mL at 01/02/11 1420  . midazolam (VERSED) injection 1-2 mg  1-2 mg Intravenous PRN Lee Kasik, MD   2 mg at 01/02/11 1416   Medications Prior to Admission  Medication Sig Dispense Refill  . ALPRAZolam (XANAX) 1 MG tablet       . atenolol (TENORMIN) 100 MG tablet Take 100 mg by mouth daily.        . atorvastatin (LIPITOR) 40 MG tablet Take 20 mg by mouth  daily.        . buPROPion (WELLBUTRIN XL) 300 MG 24 hr tablet Take 300 mg by mouth daily.        . Diltiazem HCl Coated Beads (CARDIZEM CD PO) Take 1 tablet by mouth daily.        . esomeprazole (NEXIUM) 40 MG capsule Take 40 mg by mouth daily before breakfast.        . fenofibrate micronized (LOFIBRA) 134 MG capsule       . furosemide (LASIX) 80 MG tablet Take 80 mg by mouth daily.        . hydrocortisone (ANUSOL-HC) 25 MG suppository Place 1 suppository (25 mg total) rectally every 12 (twelve) hours.  12 suppository  1  . Olmesartan Medoxomil (BENICAR PO) Take 1 tablet by mouth daily.        . testosterone cypionate (DEPOTESTOTERONE CYPIONATE) 200 MG/ML injection         No results found for this or any previous visit (from the past 48 hour(s)). No results found.  Review of Systems  Constitutional: Negative.   HENT: Negative.   Eyes: Negative.   Respiratory: Negative.   Cardiovascular: Negative.   Gastrointestinal: Negative.     Genitourinary:       Not pertinent to current symptomatology therefore not examined.  Musculoskeletal: Positive for joint pain.       Right knee pain  Skin: Negative.   Neurological: Negative.   Endo/Heme/Allergies: Negative.   Psychiatric/Behavioral: Negative.     There were no vitals taken for this visit. Physical Exam  Constitutional: He is oriented to person, place, and time. He appears well-developed and well-nourished.  HENT:  Head: Normocephalic and atraumatic.  Eyes: EOM are normal. Pupils are equal, round, and reactive to light.  Neck: Neck supple.  Cardiovascular: Normal rate and regular rhythm.   Respiratory: Effort normal.  GI: Soft.  Genitourinary:       Not pertinent to current symptomatology therefore not examined.  Musculoskeletal:       Right knee medial pain, 0-120 ROM, 2+crep, +mcmurray. Neg lachman.  2+DP pulse  Neurological: He is alert and oriented to person, place, and time.  Skin: Skin is warm and dry.  Psychiatric: He  has a normal mood and affect. His behavior is normal. Judgment and thought content normal.     Assessment Patient Active Problem List  Diagnoses  . Prolapsed external hemorrhoids  . Hemorrhoids, internal, with bleeding  . Meniscus tear  . Right knee pain     Plan Right knee arthroscopy as an outpatient.  Maleeha Halls J 01/02/2011, 2:56 PM    

## 2011-01-02 NOTE — Interval H&P Note (Signed)
History and Physical Interval Note:   01/02/2011   3:11 PM   Marvin Carter  has presented today for surgery, with the diagnosis of Right Knee Medial Meniscal tear  The various methods of treatment have been discussed with the patient and family. After consideration of risks, benefits and other options for treatment, the patient has consented to  Procedure(s): KNEE ARTHROSCOPY WITH MEDIAL MENISECTOMY as a surgical intervention .  The patients' history has been reviewed, patient examined, no change in status, stable for surgery.  I have reviewed the patients' chart and labs.  Questions were answered to the patient's satisfaction.     Nilda Simmer  MD

## 2011-01-02 NOTE — Brief Op Note (Signed)
01/02/2011  4:07 PM  PATIENT:  Geanie Cooley  55 y.o. male  PRE-OPERATIVE DIAGNOSIS:  Right Knee Medial Meniscal tear  POST-OPERATIVE DIAGNOSIS:  Right Knee Medial Meniscal tear  PROCEDURE:  Procedure(s): KNEE ARTHROSCOPY WITH MEDIAL MENISECTOMY  SURGEON:  Surgeon(s): Nilda Simmer, MD  PHYSICIAN ASSISTANT: none  ASSISTANTS: none   ANESTHESIA:   general  EBL:  Total I/O In: 900 [I.V.:900] Out: -   BLOOD ADMINISTERED:none  DRAINS: none   LOCAL MEDICATIONS USED:  NONE  SPECIMEN:  No Specimen  DISPOSITION OF SPECIMEN:  N/A  COUNTS:  YES  TOURNIQUET:  * No tourniquets in log *  DICTATION: .Note written in EPIC  PLAN OF CARE: Admit for overnight observation  PATIENT DISPOSITION:  PACU - guarded condition.   Delay start of Pharmacological VTE agent (>24hrs) due to surgical blood loss or risk of bleeding: NA  COMPLICATIONS:  RUN OF VENTRICULAR TACHYCARDIA DURING SURGERY, REVERTED TO NSR IN 15 SECONDS    HEMODYNAMICALLY STABLE THROUGHOUT PROCEDURE

## 2011-01-02 NOTE — Anesthesia Postprocedure Evaluation (Signed)
  Anesthesia Post-op Note  Patient: Marvin Carter  Procedure(s) Performed:  KNEE ARTHROSCOPY WITH MEDIAL MENISECTOMY  Patient Location: PACU  Anesthesia Type: General  Level of Consciousness: awake, alert  and oriented  Airway and Oxygen Therapy: Patient Spontanous Breathing  Post-op Pain: mild  Post-op Assessment: Post-op Vital signs reviewed, Patient's Cardiovascular Status Stable, Respiratory Function Stable, Patent Airway, No signs of Nausea or vomiting, Adequate PO intake and Pain level controlled  Post-op Vital Signs: stable  Complications: No apparent anesthesia complications

## 2011-01-02 NOTE — Progress Notes (Signed)
Nursing note: Received in Pacu from OR. CRNA report that patient had a 45 second run of vtach (non-documented) intraop followed by atrial flutter (documented). Remained in NSR while in pacu.. Dr. Thurston Hole spoke with patient about transfer to [facility] ED for cardiac evaluation by Dr Caprice Kluver and patient agrees.  [facility] called for transport.Report given to EMS. Patient transferred on stretcher, awake, alert , oriented. In NSR on room air

## 2011-01-02 NOTE — H&P (View-Only) (Signed)
Marvin Carter is an 55 y.o. male.   Chief Complaint: right knee pain HPI: 42 yowm with history of right knee pain.  + mcmurray's and MRI documenting torn meniscus.  The risks, benefits, and possible complications of the procedure were discussed in detail with the patient.  The patient is without question.  Past Medical History  Diagnosis Date  . Hypertension   . Hyperlipidemia   . Diverticulitis   . Hemorrhoids   . GERD (gastroesophageal reflux disease)   . Anxiety     Past Surgical History  Procedure Date  . Appendectomy   . Knee arthroscopy     x 8 left  . Rotator cuff repair     bilateral  . Hand surgery     right  . Bicept tendon repair     bilateral  . Tonsillectomy   . Cholecystectomy     Family History  Problem Relation Age of Onset  . Diabetes Mother   . Heart disease Mother   . Heart disease Father    Social History:  reports that he has never smoked. He has never used smokeless tobacco. He reports that he drinks alcohol. He reports that he does not use illicit drugs.  Allergies: No Known Allergies  Medications Prior to Admission  Medication Dose Route Frequency Provider Last Rate Last Dose  . Bupivacaine-Epinephrine PF (MARCAINE W/ EPI (PF)) 0.5-1:200000 % injection    PRN Bedelia Person, MD   15 mL at 01/02/11 1420  . fentaNYL (SUBLIMAZE) injection 50-100 mcg  50-100 mcg Intravenous PRN Bedelia Person, MD   100 mcg at 01/02/11 1416  . lactated ringers infusion   Intravenous Continuous Zenon Mayo, MD 20 mL/hr at 01/02/11 1406    . lidocaine-EPINEPHrine 1.5-1:200000 % injection    PRN Bedelia Person, MD   15 mL at 01/02/11 1420  . midazolam (VERSED) injection 1-2 mg  1-2 mg Intravenous PRN Bedelia Person, MD   2 mg at 01/02/11 1416   Medications Prior to Admission  Medication Sig Dispense Refill  . ALPRAZolam (XANAX) 1 MG tablet       . atenolol (TENORMIN) 100 MG tablet Take 100 mg by mouth daily.        Marland Kitchen atorvastatin (LIPITOR) 40 MG tablet Take 20 mg by mouth  daily.        Marland Kitchen buPROPion (WELLBUTRIN XL) 300 MG 24 hr tablet Take 300 mg by mouth daily.        . Diltiazem HCl Coated Beads (CARDIZEM CD PO) Take 1 tablet by mouth daily.        Marland Kitchen esomeprazole (NEXIUM) 40 MG capsule Take 40 mg by mouth daily before breakfast.        . fenofibrate micronized (LOFIBRA) 134 MG capsule       . furosemide (LASIX) 80 MG tablet Take 80 mg by mouth daily.        . hydrocortisone (ANUSOL-HC) 25 MG suppository Place 1 suppository (25 mg total) rectally every 12 (twelve) hours.  12 suppository  1  . Olmesartan Medoxomil (BENICAR PO) Take 1 tablet by mouth daily.        Marland Kitchen testosterone cypionate (DEPOTESTOTERONE CYPIONATE) 200 MG/ML injection         No results found for this or any previous visit (from the past 48 hour(s)). No results found.  Review of Systems  Constitutional: Negative.   HENT: Negative.   Eyes: Negative.   Respiratory: Negative.   Cardiovascular: Negative.   Gastrointestinal: Negative.  Genitourinary:       Not pertinent to current symptomatology therefore not examined.  Musculoskeletal: Positive for joint pain.       Right knee pain  Skin: Negative.   Neurological: Negative.   Endo/Heme/Allergies: Negative.   Psychiatric/Behavioral: Negative.     There were no vitals taken for this visit. Physical Exam  Constitutional: He is oriented to person, place, and time. He appears well-developed and well-nourished.  HENT:  Head: Normocephalic and atraumatic.  Eyes: EOM are normal. Pupils are equal, round, and reactive to light.  Neck: Neck supple.  Cardiovascular: Normal rate and regular rhythm.   Respiratory: Effort normal.  GI: Soft.  Genitourinary:       Not pertinent to current symptomatology therefore not examined.  Musculoskeletal:       Right knee medial pain, 0-120 ROM, 2+crep, +mcmurray. Neg lachman.  2+DP pulse  Neurological: He is alert and oriented to person, place, and time.  Skin: Skin is warm and dry.  Psychiatric: He  has a normal mood and affect. His behavior is normal. Judgment and thought content normal.     Assessment Patient Active Problem List  Diagnoses  . Prolapsed external hemorrhoids  . Hemorrhoids, internal, with bleeding  . Meniscus tear  . Right knee pain     Plan Right knee arthroscopy as an outpatient.  Avante Carneiro J 01/02/2011, 2:56 PM

## 2011-01-02 NOTE — Transfer of Care (Signed)
Immediate Anesthesia Transfer of Care Note  Patient: Marvin Carter  Procedure(s) Performed:  KNEE ARTHROSCOPY WITH MEDIAL MENISECTOMY  Patient Location: PACU  Anesthesia Type: General  Level of Consciousness: awake and alert   Airway & Oxygen Therapy: Patient Spontanous Breathing and Patient connected to face mask oxygen  Post-op Assessment: Report given to PACU RN and Post -op Vital signs reviewed and stable  Post vital signs: Reviewed and stable  Complications: No apparent anesthesia complications

## 2011-01-02 NOTE — Anesthesia Procedure Notes (Addendum)
Procedure Name: LMA Insertion Performed by: Sharyne Richters Pre-anesthesia Checklist: Patient identified, Emergency Drugs available, Suction available, Patient being monitored and Timeout performed Patient Re-evaluated:Patient Re-evaluated prior to inductionOxygen Delivery Method: Circle System Utilized Preoxygenation: Pre-oxygenation with 100% oxygen Intubation Type: IV induction Ventilation: Mask ventilation without difficulty LMA: LMA with gastric port inserted LMA Size: 4.0 Number of attempts: 1 Placement Confirmation: positive ETCO2 and breath sounds checked- equal and bilateral Tube secured with: Tape    R Knee portal and intrarticular block CHG prep sterile tech #[id number] spinal needle wbilat inf portal infiltration and bilat intrarticular injections Multiple neg asp Total marc .5% w/epi 15cc and lido 1.5% w/epi 15cc No compl Dr Gypsy Balsam

## 2011-01-02 NOTE — ED Notes (Signed)
Pt had a scope of the right knee today through outpatient surgery. While during the procedure it was reported that he had a run of v-tach followed by atrial flutter. Pt had resolved to a nsr but per md request from outpatient surgery he was sent here for evaluation by cardiology. Upon arrival pt has no complaints. He states that his right leg is still numb from the nerve block performed around 16:30 today. Pedal pulses present. Cap refill is less than 2 seconds. Breath sounds are clear and bowel sounds are present. Iv started pta by outpatient surgery. Cardiology at the bedside to eval pt. Plan is to check chemistries and cardiac markers and if all is wnl then there will be a plan to do outpatient cardiac assessment stress test and possible echo if needed. Pt placed on the pads with the zoll at the bedside should v-tach occur again. Family at the bedside.

## 2011-01-03 ENCOUNTER — Other Ambulatory Visit: Payer: Self-pay

## 2011-01-03 ENCOUNTER — Encounter (HOSPITAL_COMMUNITY)
Admission: EM | Disposition: A | Discharge status: Home / Self Care | Payer: Self-pay | Source: Home / Self Care | Attending: Cardiology

## 2011-01-03 ENCOUNTER — Encounter (HOSPITAL_COMMUNITY): Payer: Self-pay

## 2011-01-03 ENCOUNTER — Encounter (HOSPITAL_COMMUNITY): Payer: Self-pay | Admitting: Cardiovascular Disease

## 2011-01-03 HISTORY — PX: PERCUTANEOUS CORONARY STENT INTERVENTION (PCI-S): SHX5485

## 2011-01-03 HISTORY — PX: LEFT HEART CATHETERIZATION WITH CORONARY ANGIOGRAM: SHX5451

## 2011-01-03 LAB — CARDIAC PANEL(CRET KIN+CKTOT+MB+TROPI): Troponin I: <0.3 ng/mL (ref <0.30)

## 2011-01-03 LAB — LIPID PANEL: Cholesterol: 180 mg/dL (ref 0–200)

## 2011-01-03 LAB — POCT ACTIVATED CLOTTING TIME: Activated Clotting Time: 485 seconds

## 2011-01-03 LAB — BASIC METABOLIC PANEL
BUN: 24 mg/dL — ABNORMAL HIGH (ref 6–23)
Calcium: 9.6 mg/dL (ref 8.4–10.5)
Creatinine, Ser: 1.16 mg/dL (ref 0.50–1.35)
GFR calc Af Amer: 80 mL/min — ABNORMAL LOW (ref >90)
GFR calc non Af Amer: 69 mL/min — ABNORMAL LOW (ref >90)

## 2011-01-03 LAB — HEMOGLOBIN A1C
Hgb A1c MFr Bld: 5.8 % — ABNORMAL HIGH (ref <5.7)
Mean Plasma Glucose: 120 mg/dL — ABNORMAL HIGH (ref <117)

## 2011-01-03 LAB — MAGNESIUM: Magnesium: 1.9 mg/dL (ref 1.5–2.5)

## 2011-01-03 LAB — APTT: aPTT: 28 seconds (ref 24–37)

## 2011-01-03 LAB — TSH: TSH: 0.414 u[IU]/mL (ref 0.350–4.500)

## 2011-01-03 SURGERY — PERCUTANEOUS CORONARY STENT INTERVENTION (PCI-S)

## 2011-01-03 MED ORDER — LIDOCAINE HCL (PF) 1 % IJ SOLN
INTRAMUSCULAR | Status: AC
  Filled 2011-01-03: qty 30

## 2011-01-03 MED ORDER — HEPARIN (PORCINE) IN NACL 2-0.9 UNIT/ML-% IJ SOLN
INTRAMUSCULAR | Status: AC
  Filled 2011-01-03: qty 1000

## 2011-01-03 MED ORDER — DIAZEPAM 5 MG PO TABS: 5.0000 mg | ORAL_TABLET | ORAL | Status: DC

## 2011-01-03 MED ORDER — SODIUM CHLORIDE 0.9 % IJ SOLN: 3.0000 mL | INTRAMUSCULAR | Status: DC | PRN

## 2011-01-03 MED ORDER — VERAPAMIL HCL 2.5 MG/ML IV SOLN
INTRAVENOUS | Status: AC
  Filled 2011-01-03: qty 2

## 2011-01-03 MED ORDER — HEPARIN SODIUM (PORCINE) 1000 UNIT/ML IJ SOLN
INTRAMUSCULAR | Status: AC
  Filled 2011-01-03: qty 1

## 2011-01-03 MED ORDER — NITROGLYCERIN 0.2 MG/ML ON CALL CATH LAB
INTRAVENOUS | Status: AC
  Filled 2011-01-03: qty 1

## 2011-01-03 MED ORDER — PRASUGREL HCL 10 MG PO TABS
10.0000 mg | ORAL_TABLET | Freq: Every day | ORAL | Status: DC
Start: 2011-01-03 — End: 2011-01-04
  Administered 2011-01-04: 10 mg via ORAL
  Filled 2011-01-03 (×2): qty 1

## 2011-01-03 MED ORDER — MORPHINE SULFATE 2 MG/ML IJ SOLN
2.0000 mg | INTRAMUSCULAR | Status: DC | PRN
  Administered 2011-01-03 – 2011-01-04 (×4): 2 mg via INTRAVENOUS
  Filled 2011-01-03 (×4): qty 1

## 2011-01-03 MED ORDER — SODIUM CHLORIDE 0.9 % IJ SOLN
3.0000 mL | Freq: Two times a day (BID) | INTRAMUSCULAR | Status: DC
  Administered 2011-01-03: 3 mL via INTRAVENOUS

## 2011-01-03 MED ORDER — SODIUM CHLORIDE 0.9 % IV SOLN: 1.0000 mL/kg/h | INTRAVENOUS | Status: DC

## 2011-01-03 MED ORDER — ADENOSINE 12 MG/4ML IV SOLN
16.0000 mL | INTRAVENOUS | Status: DC
  Filled 2011-01-03: qty 16

## 2011-01-03 MED ORDER — ONDANSETRON HCL 4 MG/2ML IJ SOLN: 4.0000 mg | Freq: Four times a day (QID) | INTRAMUSCULAR | Status: DC | PRN

## 2011-01-03 MED ORDER — FENTANYL CITRATE 0.05 MG/ML IJ SOLN
INTRAMUSCULAR | Status: AC
  Filled 2011-01-03: qty 2

## 2011-01-03 MED ORDER — ACETAMINOPHEN 325 MG PO TABS: 650.0000 mg | ORAL_TABLET | ORAL | Status: DC | PRN

## 2011-01-03 MED ORDER — SODIUM CHLORIDE 0.9 % IV SOLN: INTRAVENOUS | Status: AC

## 2011-01-03 MED ORDER — DILTIAZEM HCL ER COATED BEADS 300 MG PO CP24
420.0000 mg | ORAL_CAPSULE | Freq: Every day | ORAL | Status: DC
  Administered 2011-01-04: 420 mg via ORAL
  Filled 2011-01-03: qty 1

## 2011-01-03 MED ORDER — SODIUM CHLORIDE 0.9 % IV SOLN
250.0000 mL | INTRAVENOUS | Status: DC
  Administered 2011-01-03: 12:00:00 via INTRAVENOUS

## 2011-01-03 MED ORDER — BIVALIRUDIN 250 MG IV SOLR
INTRAVENOUS | Status: AC
  Filled 2011-01-03: qty 250

## 2011-01-03 MED ORDER — PRASUGREL HCL 10 MG PO TABS
ORAL_TABLET | ORAL | Status: AC
  Administered 2011-01-04: 10 mg via ORAL
  Filled 2011-01-03: qty 6

## 2011-01-03 MED ORDER — MIDAZOLAM HCL 2 MG/2ML IJ SOLN
INTRAMUSCULAR | Status: AC
  Filled 2011-01-03: qty 2

## 2011-01-03 MED ORDER — ASPIRIN 81 MG PO CHEW: 324.0000 mg | CHEWABLE_TABLET | ORAL | Status: DC

## 2011-01-03 NOTE — Interval H&P Note (Signed)
History and Physical Interval Note:   01/03/2011   1:57 PM   Marvin Carter  has presented today for surgery, with the diagnosis of chest pain  The various methods of treatment have been discussed with the patient and family. After consideration of risks, benefits and other options for treatment, the patient has consented to  Procedure(s): LEFT AND RIGHT HEART CATHETERIZATION WITH CORONARY ANGIOGRAM as a surgical intervention .  The patients' history has been reviewed, patient examined, no change in status, stable for surgery.  I have reviewed the patients' chart and labs.  Questions were answered to the patient's satisfaction.     Runell Gess  MD

## 2011-01-03 NOTE — Op Note (Signed)
NAMEKEMAL, AMORES NO.:  [id number]  MEDICAL RECORD NO.:  [id number]  LOCATION:  3729                         FACILITY:  [facility]  PHYSICIAN:  Elana Alm. Thurston Hole, M.D. DATE OF BIRTH:  August 14, 1955  DATE OF PROCEDURE:  01/02/2011 DATE OF DISCHARGE:                              OPERATIVE REPORT   PREOPERATIVE DIAGNOSIS:  Right knee medial and lateral meniscal tears with chondromalacia and synovitis.  POSTOPERATIVE DIAGNOSIS:  Right knee medial and lateral meniscal tears with chondromalacia and synovitis.  PROCEDURE: 1. Right knee exam under anesthesia, followed by arthroscopic partial     medial and meniscectomies. 2. Right knee chondroplasty with partial synovectomy.  SURGEON:  Elana Alm. Thurston Hole, MD  ANESTHESIA:  General.  OPERATIVE TIME:  Above 40 minutes.  COMPLICATIONS:  Ventricular tachycardia.  ANESTHESIA:  General.  INDICATION FOR PROCEDURE:  Mr. Davitt is a 55 year old gentleman who has had 3-4 months of increasing right knee pain with severe locking of the right knee over the past 2-3 weeks with exam and x-rays consistent with a displaced meniscus tear and is now to undergo arthroscopy due to failed conservative care.  DESCRIPTION:  Mr. Corlett was brought to the operating room on January 02, 2011, after a knee block was placed in the holding room by Anesthesia.  He was placed on operative table in supine position.  After being placed under general anesthesia, his right knee was examined.  He had full range of motion.  Knee was stable to ligamentous exam with normal patellar tracking.  He received Ancef 2 g IV preoperatively for prophylaxis, and his right leg was then prepped using sterile DuraPrep and draped using sterile technique.  Time-out procedure was called and the correct right knee identified.  Initially, through an anterolateral portal, the arthroscope with a pump attached was placed into an anteromedial portal, an arthroscopic probe  was placed.  On initial inspection of the medial compartment, he was found to have 75% grade 3 chondromalacia, which was debrided.  Medial meniscus showed tearing of the posterior medial horn of which 50% was resected back to a stable rim.  Intercondylar notch inspected, anterior and posterior cruciate ligaments were normal.  At this point in the procedure, the patient had a 15-second episode of ventricular tachycardia.  We immediately stopped the procedure and code was called, however, within 15 seconds, he reverted to normal sinus rhythm.  We watched this closely with anesthesiologist, Dr. Gypsy Balsam and with him stable and in normal sinus rhythm Dr. Gypsy Balsam and I both agreed that it would be safe to continue quickly with the end of the procedure as he was hemodynamically stable throughout this entire episode.  At this point, I proceeded with the procedure.  Lateral compartment was inspected.  He was found to have 25- 30% grade 3 chondromalacia on the femur and 75% grade 3 chondromalacia on the lateral tibial plateau which was debrided.  Lateral meniscus tearing was noted of which 30% was resected back to a stable rim. Patellofemoral joint showed grade 4 DJD and chondromalacia throughout the patellofemoral joint.  The patella tracked normally.  Moderate synovitis in medial and lateral gutters were debrided, otherwise, this was free of pathology.  After this was done, it was felt that all pathology had been satisfactorily addressed.  The instruments were removed.  Portals were closed with 3-0 nylon sutures, sterile dressings were applied.  At this point, the patient had remained completely stable and in normal sinus rhythm.  He was awake and extubated and taken to recovery room in stable condition.  Follow up care discussed in detail of Mr. Hedberg case with Anesthesia and also talked to Dr. Julieanne Manson of Cardiology who felt it would be prudent to have Mr. Gappa transfer to [facility]  for cardiac evaluation.  Because of this, we proceeded with a transfer from the recovery room at [facility] to the emergency room at [facility] for Cardiology evaluation. He will be followed closely by Cardiology and we will continue to follow him orthopedically as well.     Lusine Corlett A. Thurston Hole, M.D.     RAW/MEDQ  D:  01/03/2011  T:  01/03/2011  Job:  [id number]

## 2011-01-03 NOTE — Brief Op Note (Signed)
  Dictation#[id number] WUJ#[id number]#[id number]  Runell Gess 01/03/2011 6:03 PM

## 2011-01-03 NOTE — Cardiovascular Report (Signed)
NAMEMarland Carter  WINTER, TREFZ NO.:  [id number]  MEDICAL RECORD NO.:  [id number]  LOCATION:  2506                         FACILITY:  [facility]  PHYSICIAN:  Marvin Carter, M.D.   DATE OF BIRTH:  October 05, 1955  DATE OF PROCEDURE: DATE OF DISCHARGE:                           CARDIAC CATHETERIZATION   Mr. Marvin Carter is a 55 year old married Caucasian male admitted from same- day surgery with documented ventricular tachycardia during anesthesia for arthroscopic knee surgery being performed by Dr. Salvatore Carter.  He is a patient of Dr. Julieanne Carter.  He did have a Myoview stress test from several years ago that showed mild anterior ischemia.  Rule out for myocardial infarction.  The patient presents now for diagnostic coronary arteriography via the right radial approach to define his anatomy and rule out ischemic etiology.  PROCEDURE DESCRIPTION:  The patient was brought to the second floor [facility] cath lab in the postabsorptive state.  He was premedicated with p.o. Valium, IV Versed, and fentanyl.  His right wrist was prepped and shaved in usual sterile fashion.  Xylocaine 1% was used for local anesthesia.  A 5-French sheath was inserted into the right radial artery using standard Seldinger technique.  The patient received 5000 units of heparin intravenously.  10 mL of "radial cocktail" was administered via the side-arm sheath.  A 5-French TIG catheter was used for selective coronary angiography, 5-French pigtail catheter used for left ventriculography.  Visipaque dye was used for the entirety of the case.  Retrograde aortic, left ventricular, and pullback pressures were recorded.  HEMODYNAMICS:  See cath lab flow.  ANGIOGRAPHIC RESULTS: 1. Left main normal. 2. LAD; LAD had a 70% stenosis in the midportion of takeoff of the     small diagonal branch. 3. Left circumflex; free of significant disease. 4. Right coronary artery; codominant with anterior takeoff and free  of     significant disease. 5. Left ventriculography; RAO left ventriculogram was performed using     25 mL of Visipaque dye at 12 mL/second.  The overall LVEF estimated     greater than 60% without focal wall motion abnormalities.  IMPRESSION:  Mr. Marvin Carter has an intermediate mid left anterior descending lesion, possibly responsible for his ventricular tachycardia and in the same distribution is his mildly abnormal Myoview.  We will proceed with FFR interrogation to determine hemodynamic significance prior to percutaneous intervention.  PROCEDURE DESCRIPTION:  The 5-French sheath in the right radial artery was exchanged over wire for a 6-French sheath.  The patient had already received 325 mg of aspirin.  He received Angiomax bolus with an ACT of 480.  Total of 160 mL of contrast was used during the case.  Using a 6- [place] XB LAD 3.5 guide catheter along with a FFR prime wire FFR was performed.  The wire was placed beyond the lesion, and adenosine was administered.  The FFR was 0.79 suggesting physiologic significance, and therefore PCI was performed.  The patient received 60 mg of Effient p.o.  Direct stenting was performed with a 3.0 x 12 Resolute drug-eluting stent deployed at 14 atmospheres.  Postdilatation performed with a 3.25 x 8 mm long [place] at 16 atmospheres (3.36 mm)  resulting in reduction of 70% hypodense mid LAD lesion to 0% residual.  Small diagonal branch of the left 1.5 mm, which was present prior to intervention was intermittently present postintervention.  IMPRESSION:  Successful FFR-guided mid left anterior descending percutaneous coronary intervention stenting using Resolute drug-eluting stent, Angiomax, and Effient.  The patient tolerated this procedure well.  The guide wire and catheter removed.  The sheath was removed and a TR band was placed on the right wrist and inflated to 13 mL achieving patent hemostasis.  The patient left the lab in stable condition.   The patient will reside in unit 2500 overnight, and will be discharged home in the morning if he remains clinically stable.     Marvin Carter, M.D.     JB/MEDQ  D:  01/03/2011  T:  01/03/2011  Job:  [id number]  cc:   [facility] Elana Alm. Thurston Hole, M.D.

## 2011-01-04 ENCOUNTER — Other Ambulatory Visit: Payer: Self-pay

## 2011-01-04 ENCOUNTER — Encounter (HOSPITAL_COMMUNITY): Payer: Self-pay | Admitting: Cardiology

## 2011-01-04 DIAGNOSIS — Z955 Presence of coronary angioplasty implant and graft: Secondary | ICD-10-CM

## 2011-01-04 DIAGNOSIS — I4892 Unspecified atrial flutter: Secondary | ICD-10-CM

## 2011-01-04 DIAGNOSIS — I2510 Atherosclerotic heart disease of native coronary artery without angina pectoris: Secondary | ICD-10-CM

## 2011-01-04 HISTORY — DX: Unspecified atrial flutter: I48.92

## 2011-01-04 HISTORY — DX: Atherosclerotic heart disease of native coronary artery without angina pectoris: I25.10

## 2011-01-04 HISTORY — DX: Presence of coronary angioplasty implant and graft: Z95.5

## 2011-01-04 LAB — CBC
MCH: 33.3 pg (ref 26.0–34.0)
MCHC: 34.5 g/dL (ref 30.0–36.0)
Platelets: 196 10*3/uL (ref 150–400)
RBC: 4.35 MIL/uL (ref 4.22–5.81)
RDW: 13.2 % (ref 11.5–15.5)

## 2011-01-04 LAB — BASIC METABOLIC PANEL
CO2: 25 mEq/L (ref 19–32)
Calcium: 8.6 mg/dL (ref 8.4–10.5)
GFR calc Af Amer: 79 mL/min — ABNORMAL LOW (ref >90)
GFR calc non Af Amer: 68 mL/min — ABNORMAL LOW (ref >90)
Sodium: 137 mEq/L (ref 135–145)

## 2011-01-04 MED ORDER — NITROGLYCERIN 0.4 MG SL SUBL: 0.4000 mg | SUBLINGUAL_TABLET | SUBLINGUAL | Status: DC | PRN

## 2011-01-04 MED ORDER — OXYCODONE-ACETAMINOPHEN 5-325 MG PO TABS: 2.0000 | ORAL_TABLET | ORAL | Status: DC | PRN

## 2011-01-04 MED ORDER — ASPIRIN 81 MG PO CHEW
81.0000 mg | CHEWABLE_TABLET | Freq: Every day | ORAL | Status: DC
Start: 2011-01-04 — End: 2011-01-04
  Administered 2011-01-04: 81 mg via ORAL
  Filled 2011-01-04: qty 1

## 2011-01-04 MED ORDER — HYDROCODONE-ACETAMINOPHEN 10-325 MG PO TABS: 1.0000 | ORAL_TABLET | ORAL | Status: AC | PRN

## 2011-01-04 MED ORDER — PRASUGREL HCL 10 MG PO TABS: 10.0000 mg | ORAL_TABLET | Freq: Every day | ORAL | Status: DC

## 2011-01-04 MED ORDER — ATORVASTATIN CALCIUM 40 MG PO TABS: 40.0000 mg | ORAL_TABLET | Freq: Every day | ORAL | Status: DC

## 2011-01-04 MED ORDER — ASPIRIN 81 MG PO CHEW: 81.0000 mg | CHEWABLE_TABLET | Freq: Every day | ORAL | Status: AC

## 2011-01-04 MED ORDER — HYDROCODONE-ACETAMINOPHEN 10-325 MG PO TABS
1.0000 | ORAL_TABLET | ORAL | Status: DC | PRN
  Administered 2011-01-04: 1 via ORAL
  Filled 2011-01-04: qty 1

## 2011-01-04 NOTE — Discharge Summary (Signed)
Physician Discharge Summary  Patient ID: Marvin Carter MRN: [id number] DOB/AGE: May 17, 1955 55 y.o.  Admit date: 01/02/2011 Discharge date: 01/04/2011  Discharge Diagnoses:  Principal Problem:  *NSVT (nonsustained ventricular tachycardia) Active Problems:  CAD (coronary artery disease), native coronary artery  Stented coronary artery  Atrial flutter, paroxysmal  Hyperlipemia  History of meniscectomy of right knee  S/P right knee arthroscopy  Hemarthrosis of knee, right   Discharged Condition: good  Hospital Course: 56 year old white married male with history of hypertension hyperlipidemia and meniscal tear presented to her outpatient surgery 01/02/2011 where he underwent surgery on his right meniscus by Dr. Salvatore Marvel.  During the procedure he had nonsustained ventricular tachycardia that we do not have strips of the reportedly 30-45 seconds and then in PACU he had a flutter, rate controlled. Because of the symptoms he was brought to Mrs. Condon in mid to rule out myocardial infarction and proceed with cardiac catheterization.  His cardiac enzymes were negative for MI cardiac catheterization revealed 70% LAD stenosis he underwent PTCA and stent to the LAD with a drug-eluting Resolute stent. Tolerating the procedure well and by the next morning from a cardiac standpoint he was stable.   He did have significant right knee pain with right knee swelling. Dr. Sherene Sires PA Julien Girt saw  The patient and did procedure: Hemarthrosis of the right knee the patient immediately improved with his right knee pain.  Patient was able to ambulate with very minimal weight on the right wrist at the cath site and no weight on the right knee.  Due to his arrhythmias prior to admission medicines, and we will have him wear auto event monitor as an outpatient.  He is on high-dose beta blocker as well as calcium channel blocker. At discharge he will be on Effient and aspirin in addition to his  previous medication.  Consults: orthopedic surgery  Significant Diagnostic Studies:  Laboratory at discharge sodium 137 potassium 3.7 chloride 103 CO2 25 BUN 20 creatinine 1.18 calcium 8.6 glucose 112 magnesium 1.9 alkaline phosphorus 8 albumin 4.2 AST 35 ALT 40 total protein 7.2 total bili 0.5.  Cardiac markers and CK and 160, 141. MB 4.9 and 3.7. Troponin less than 0.30 all negative for myocardial infarction  Cholesterol 180 triglycerides 151 HDL 49 LDL 101   Hemoglobin 14.5 hematocrit 42 WCL 1.8 and platelets 196  Hemoglobin A1c was 5.8. And TSH was 0.414  EKG post procedure sinus bradycardia no acute changes.  Discharge Exam: Blood pressure 132/79, pulse 52, temperature 98.5 F (36.9 C), temperature source Oral, resp. rate 9, height 5\' 8"  (1.727 m), weight 102.2 kg (225 lb 5 oz), SpO2 97.00%. Exam stable please see progress notes for both orthopedic and cardiology on date of discharge.      Disposition: Home or Self Care   Current Discharge Medication List    START taking these medications   Details  aspirin 81 MG chewable tablet Chew 1 tablet (81 mg total) by mouth daily. Qty: 1 tablet, Refills: 0    HYDROcodone-acetaminophen (NORCO) 10-325 MG per tablet Take 1-2 tablets by mouth every 4 (four) hours as needed. Qty: 30 tablet, Refills: 0    nitroGLYCERIN (NITROSTAT) 0.4 MG SL tablet Place 1 tablet (0.4 mg total) under the tongue every 5 (five) minutes as needed for chest pain (Take one tab under your tongue, while sitting, if needed for chest pain.  may repeat every 5 min up to total of 3 over 15 min. ). Qty: 25 tablet, Refills: 4  prasugrel (EFFIENT) 10 MG TABS Take 1 tablet (10 mg total) by mouth daily. Qty: 90 tablet, Refills: 4      CONTINUE these medications which have CHANGED   Details  atorvastatin (LIPITOR) 40 MG tablet Take 1 tablet (40 mg total) by mouth daily. Qty: 30 tablet, Refills: 11      CONTINUE these medications which have NOT CHANGED    Details  ALPRAZolam (XANAX) 1 MG tablet Take 1 mg by mouth 3 (three) times daily as needed. For anxiety    atenolol (TENORMIN) 100 MG tablet Take 100 mg by mouth daily.     buPROPion (WELLBUTRIN XL) 300 MG 24 hr tablet Take 300 mg by mouth daily.     diltiazem (TIAZAC) 420 MG 24 hr capsule Take 420 mg by mouth daily.      esomeprazole (NEXIUM) 40 MG capsule Take 40 mg by mouth daily before breakfast.     fenofibrate micronized (LOFIBRA) 134 MG capsule Take 134 mg by mouth daily before breakfast.     furosemide (LASIX) 80 MG tablet Take 80 mg by mouth daily.      hydrocortisone (ANUSOL-HC) 25 MG suppository Place 25 mg rectally 2 (two) times daily as needed. For discomfort     olmesartan (BENICAR) 40 MG tablet Take 40 mg by mouth daily.      testosterone cypionate (DEPOTESTOTERONE CYPIONATE) 200 MG/ML injection Inject 200 mg into the muscle every 28 (twenty-eight) days.     VOLTAREN 1 % GEL Apply 1 application topically 4 (four) times daily as needed. For pain to affected area      STOP taking these medications     oxyCODONE-acetaminophen (ROXICET) 5-325 MG per tablet        Follow-up Information    Follow up with Runell Gess, MD on 01/17/2011. (Appt.  time 3:15 pm)    Contact information:   [address]  [place] [address] [phone]       Call Nilda Simmer, MD.   Contact information:   [facility] [address] [place] [address] [phone]          Signed: Leone Brand 01/04/2011, 12:42 PM

## 2011-01-04 NOTE — Progress Notes (Signed)
Pt admitted with cp. S/p cardiac cath. Plan for d/c today on effient. CM will provide pt with effient copay card and 30 day free card. MD please write rx for 30 day free  no refills and original rx with refills. No other needs assessed by CM @ this time. Gala Lewandowsky, CM 10:32 AM, 01/04/2011

## 2011-01-04 NOTE — Progress Notes (Signed)
PATIENT ID: Marvin Carter  MRN: [id number]  DOB/AGE:  Apr 04, 1955 / 55 y.o.  1 Day Post-Op Procedure(s) (LRB): LEFT HEART CATHETERIZATION WITH CORONARY ANGIOGRAM (N/A) PERCUTANEOUS CORONARY STENT INTERVENTION (PCI-S) (N/A) 2 day post op right knee arthroscopy with medial menisectomy   PROGRESS NOTE Subjective: Patient is alert, oriented,noNausea, no Vomiting, yes passing gas, no Bowel Movement. Taking PO well. Denies SOB, Chest or Calf Pain. Using Incentive Spirometer, PAS in place. Ambulating with significant difficulty due to knee pain and swelling.  Only allowed to use one crutch due to cath entry point in his right arm. Patient reports pain as 10 on 0-10 scale  .    Objective: Vital signs in last 24 hours: Filed Vitals:   01/04/11 0455 01/04/11 0639 01/04/11 0813 01/04/11 1035  BP:    132/79  Pulse: 53 52    Temp:   98.5 F (36.9 C)   TempSrc:   Oral   Resp: 18 9    Height:      Weight:      SpO2: 96% 97%        Intake/Output from previous day: I/O last 3 completed shifts: In: 325.9 [I.V.:325.9] Out: 1400 [Urine:1400]   Intake/Output this shift: Total I/O In: 480 [P.O.:480] Out: 500 [Urine:500]   LABORATORY DATA:  Basename 01/04/11 0314 01/03/11 0005 01/02/11 1923  WBC 11.8* -- 10.1  HGB 14.5 -- 16.3  HCT 42.0 -- 45.8  PLT 196 -- 191  NA 137 137 --  K 3.7 4.3 --  CL 103 100 --  CO2 25 23 --  BUN 20 24* --  CREATININE 1.18 1.16 --  GLUCOSE 112* 140* --  INR -- 1.04 --  CALCIUM 8.6 -- --    Examination: ABD soft Neurovascular intact Sensation intact distally Intact pulses distally Dorsiflexion/Plantar flexion intact Incision: scant drainage Compartment soft 3+effusion}  Assessment:   1 Day Post-Op Procedure(s) (LRB): LEFT HEART CATHETERIZATION WITH CORONARY ANGIOGRAM (N/A) PERCUTANEOUS CORONARY STENT INTERVENTION (PCI-S) (N/A) ADDITIONAL DIAGNOSIS:  Hypertension, Cardiac Arrythmia run of vtach intraop and s/p right knee arthroscopy with medial  menisectomy, CAD,   Plan:  PT/OT WBAT,  Aspirate right knee today  DISCHARGE PLAN: Home  DISCHARGE NEEDS: ok to shower Saturday.  Keep wound clean and dry.

## 2011-01-04 NOTE — Discharge Summary (Signed)
Agree. Seen by me at time of discharge. Brigetta Beckstrom

## 2011-01-04 NOTE — Progress Notes (Signed)
Procedure Note:  Pre OP dx Hemarthrosis right knee  Post op dx Hemarthrosis right knee  Procedure: Aspiration of right knee  Findings: 60 cc bloody fluid  Under sterile conditions, pt's knee was prepped with betadine.  Using 3 cc of 1% lidocaine, pt's skin was injected in a superior lateral position.  60 cc of bloody fluid was aspirated.  10cc of 0.5% marcaine with epi was injected.  Pt tolerated the procedure well without complications.  Mayzee Reichenbach A. Gwinda Passe Physician Assistant [facility] [phone]  01/04/2011, 11:17 AM

## 2011-01-04 NOTE — Progress Notes (Addendum)
Subjective:  Complains of knee pain.  No chest pain. No SOB. Pt. Has talked with Dr. Thurston Hole by phone and pain med had been called in for him as outpt. He was instructed to remove ACE wrap, continue ICE.  Objective: Vital signs in last 24 hours: Temp:  [97.8 F (36.6 C)-98.5 F (36.9 C)] 98.5 F (36.9 C) (11/15 0813) Pulse Rate:  [52-72] 52  (11/15 0639) Resp:  [9-25] 9  (11/15 0639) BP: (79-146)/(44-86) 128/68 mmHg (11/15 0403) SpO2:  [95 %-99 %] 97 % (11/15 0639) Weight change:  Last BM Date: 01/02/11 Intake/Output from previous day: 11/14 0701 - 11/15 0700 In: 215.9 [I.V.:215.9] Out: 1400 [Urine:1400] Intake/Output this shift: Total I/O In: -  Out: 500 [Urine:500] ( -1184.)  PE:  GENERAL:A&O Pleasant affect, but obvious pain from knee. HEART:S1S2, RRR, no murmur, rub or click. LUNGS: Clear bilaterally without rales, rhonchi or wheezes. WUJ:WJXB, non-tender, +BS, passing gas, no BM yet. EXT:Rt. Knee swollen, 2+ pedal pulses. RT radial site without hematoma.   Lab Results:  [facility] 01/04/11 0314 01/02/11 1923  WBC 11.8* 10.1  HGB 14.5 16.3  HCT 42.0 45.8  PLT 196 191   BMET  Basename 01/04/11 0314 01/03/11 0005  NA 137 137  K 3.7 4.3  CL 103 100  CO2 25 23  GLUCOSE 112* 140*  BUN 20 24*  CREATININE 1.18 1.16  CALCIUM 8.6 9.6    Basename 01/02/11 2341 01/02/11 1923  TROPONINI <0.30 <0.30    Lab Results  Component Value Date   CHOL 180 01/03/2011   HDL 49 01/03/2011   LDLCALC 101* 01/03/2011   TRIG 151* 01/03/2011   CHOLHDL 3.7 01/03/2011   Lab Results  Component Value Date   HGBA1C 5.8* 01/03/2011     Lab Results  Component Value Date   TSH 0.414 01/03/2011    Hepatic Function Panel  Basename 01/02/11 1923  PROT 7.2  ALBUMIN 4.2  AST 35  ALT 40  ALKPHOS 38*  BILITOT 0.5  BILIDIR --  IBILI --    Basename 01/03/11 0005  CHOL 180   No results found for this basename: PROTIME in the last 72 hours    EKG: Orders placed during  the hospital encounter of 01/02/11  . EKG 12-LEAD  . EKG 12-LEAD  . EKG 12-LEAD  . EKG 12-LEAD  . EKG 12-LEAD    Studies/Results: No results found.  Medications: I have reviewed the patient's current medications.  Assessment/Plan: Patient Active Problem List  Diagnoses  . Prolapsed external hemorrhoids  . Hemorrhoids, internal, with bleeding  . Meniscus tear  . Right knee pain  . NSVT (nonsustained ventricular tachycardia)  . Atrial flutter, paroxysmal  . CAD (coronary artery disease), native coronary artery  . Stented coronary artery  . Hyperlipemia  . S/P right knee arthroscopy  . History of meniscectomy of right knee   PLAN:Pt presented after rt. Meniscectomy for tear.  He had NSVT and PAFl. In OR and recovery.  Neg. Cardiac enzymes.  Cardiac cath with 70% stenosis of LAD undergoing PTCA & DES STENT.    Continues with knee pain.  Cardiac stable, no arrhythmias except mild S. Bradycardia & 1 set paired PVCs.  Knee is the greatest problem. Will remove ace wrap, continue ice.  Problem will be ambulating with crutches with Rt radial art. Cath. Will attempt with cardiac rehab, but may need to stay an additional day. Will increase po pain meds.      Plan for d/c today.  LOS: 2 days   INGOLD,LAURA R 01/04/2011, 8:33 AM  Seen and examined by me. Agree w Corliss Blacker note. No angina and no recurrent arrhythmia.  D/C home. Activity discussed. Dual antiplatelet therapy compliance discussed. Will need event monitor for possible recurrent atrial arrhythmia (masked by chronic beta blocker and diltiazem therapy). Statin dose has been increased.Target LDL-C preferably around 70 mg/dL.  Outpt. 30 day event monitor. For now, ASA and Effient are appropriate, but if there is evidence of recurrent atrial tachyarrhythmia he will need anticoagulant Rx once Effient stopped.

## 2011-01-04 NOTE — Progress Notes (Signed)
CARDIAC REHAB PHASE I   PRE:  Rate/Rhythm: 72 SR    BP: sitting 140/66    SaO2: 99  RA  MODE:  Ambulation: 50 ft   POST:  Rate/Rhythm: 80    BP: sitting 132/79     SaO2: 100 RA  Ambulated pt at NP's request.  Pt used crutches and assist x2. Trouble with standing and sitting due to pain.  Heavy weight on arms/crutches walking.  Encouraged pt to lean more on left side if possible so to not have excessive pressure on right wrist and to keep slow so not to be off balance. Ed completed with wife present. Did not give ex gl due to knee restrictions. Pt is interested in CRPII in future (after knee rehab). 0850-1000  Harriet Masson CES, ACSM

## 2011-01-05 MED FILL — Dextrose Inj 5%: INTRAVENOUS | Qty: 50 | Status: AC

## 2011-03-22 ENCOUNTER — Encounter (HOSPITAL_COMMUNITY): Payer: Self-pay

## 2011-03-22 ENCOUNTER — Encounter (HOSPITAL_COMMUNITY)
Admission: RE | Admit: 2011-03-22 | Discharge: 2011-03-22 | Disposition: A | Discharge status: Home / Self Care | Payer: 59 | Source: Ambulatory Visit | Attending: Cardiovascular Disease | Admitting: Cardiovascular Disease

## 2011-03-26 ENCOUNTER — Encounter (HOSPITAL_COMMUNITY)
Admission: RE | Admit: 2011-03-26 | Discharge: 2011-03-26 | Disposition: A | Discharge status: Home / Self Care | Payer: PRIVATE HEALTH INSURANCE | Source: Ambulatory Visit | Attending: Cardiovascular Disease | Admitting: Cardiovascular Disease

## 2011-03-26 DIAGNOSIS — I4892 Unspecified atrial flutter: Secondary | ICD-10-CM | POA: Insufficient documentation

## 2011-03-26 DIAGNOSIS — I2510 Atherosclerotic heart disease of native coronary artery without angina pectoris: Secondary | ICD-10-CM | POA: Insufficient documentation

## 2011-03-26 DIAGNOSIS — I4729 Other ventricular tachycardia: Secondary | ICD-10-CM | POA: Insufficient documentation

## 2011-03-26 DIAGNOSIS — I472 Ventricular tachycardia, unspecified: Secondary | ICD-10-CM | POA: Insufficient documentation

## 2011-03-26 DIAGNOSIS — I10 Essential (primary) hypertension: Secondary | ICD-10-CM | POA: Insufficient documentation

## 2011-03-26 DIAGNOSIS — K219 Gastro-esophageal reflux disease without esophagitis: Secondary | ICD-10-CM | POA: Insufficient documentation

## 2011-03-26 DIAGNOSIS — Z9861 Coronary angioplasty status: Secondary | ICD-10-CM | POA: Insufficient documentation

## 2011-03-26 DIAGNOSIS — E785 Hyperlipidemia, unspecified: Secondary | ICD-10-CM | POA: Insufficient documentation

## 2011-03-26 DIAGNOSIS — Z5189 Encounter for other specified aftercare: Secondary | ICD-10-CM | POA: Insufficient documentation

## 2011-03-26 NOTE — Progress Notes (Signed)
Pt started cardiac rehab today.  Pt tolerated light exercise without difficulty. Pt monitor showed sr with no noted ectopy.  Continue to monitor.

## 2011-03-28 ENCOUNTER — Encounter (HOSPITAL_COMMUNITY)
Admission: RE | Admit: 2011-03-28 | Discharge: 2011-03-28 | Disposition: A | Discharge status: Home / Self Care | Payer: PRIVATE HEALTH INSURANCE | Source: Ambulatory Visit | Attending: Cardiovascular Disease | Admitting: Cardiovascular Disease

## 2011-03-28 NOTE — Progress Notes (Signed)
Marvin Carter 56 y.o. male       Nutrition Screen                                                                    YES  NO Do you live in a nursing home?  X   Do you eat out more than 3 times/week?    X If yes, how many times per week do you eat out?  Do you have food allergies?   X If yes, what are you allergic to?  Have you gained or lost more than 10 lbs without trying?              X  If yes, how much weight have you lost and over what time period? 15 lbs gained or lost over  2 weeks/month  Do you want to lose weight?    X  If yes, what is a goal weight or amount of weight you would like to lose? 190 lb  Do you eat alone most of the time?   X   Do you eat less than 2 meals/day?  X If yes, how many meals do you eat?  Do you drink more than 3 alcohol drinks/day?  X If yes, how many drinks per day?  Are you having trouble with constipation? *  X If yes, what are you doing to help relieve constipation?  Do you have financial difficulties with buying food?*    X   Are you experiencing regular nausea/ vomiting?*     X   Do you have a poor appetite? *                                        X   Do you have trouble chewing/swallowing? *   X    Pt with diagnoses of:  X Stent/ PTCA X Dyslipidemia  / HDL< 40 / LDL>70 / High TG      X %  Body fat >goal / Body Mass Index >25 X HTN / BP >120/80 X Pre-diabetes       Pt Risk Score   2       Diagnosis Risk Score  25       Total Risk Score   27                         High Risk               X Low Risk    HT: 67" Ht Readings from Last 1 Encounters:  03/22/11 5\' 7"  (1.702 m)    WT:   214.9 lb (97.7 kg) Wt Readings from Last 3 Encounters:  03/22/11 215 lb 6.2 oz (97.7 kg)  01/03/11 225 lb 5 oz (102.2 kg)  01/03/11 225 lb 5 oz (102.2 kg)     IBW 67.3 142%IBW BMI 33.7 29.5%body fat  Meds reviewed: [facility] Past Medical History  Diagnosis Date  . Hypertension   . Hyperlipidemia   . Diverticulitis   . Hemorrhoids   . GERD  (gastroesophageal reflux disease)   . Anxiety   . Meniscus tear  01/02/2011  . Right knee pain 01/02/2011  . Diverticulosis   . Hemarthrosis of knee, right 01/04/2011  . NSVT (nonsustained ventricular tachycardia) 01/02/2011  . CAD (coronary artery disease), native coronary artery 01/04/2011  . Stented coronary artery 01/04/2011  . Hyperlipemia 01/04/2011  . Atrial flutter, paroxysmal 01/04/2011  . History of meniscectomy of right knee 01/02/2011        Activity level: Pt activity level is unknown at this time Wt goal: 190-202 lb ( 86.4-91.8 kg) Current tobacco use? No      Food/Drug Interaction? No       Labs:  Lipid Panel     Component Value Date/Time   CHOL 180 01/03/2011 0005   TRIG 151* 01/03/2011 0005   HDL 49 01/03/2011 0005   CHOLHDL 3.7 01/03/2011 0005   VLDL 30 01/03/2011 0005   LDLCALC 101* 01/03/2011 0005   Lab Results  Component Value Date   HGBA1C 5.8* 01/03/2011   01/03/11 Glucose 112  LDL goal: < 100      MI, DM, Carotid or PVD and > 2:      HTN, family h/o, > 56 yo male Estimated Daily Nutrition Needs for: ? wt loss  1500-2000 Kcal , Total Fat 40-50gm, Saturated Fat 11-15 gm, Trans Fat 1.4-2.0 gm,  Sodium less than 1500 mg

## 2011-03-30 ENCOUNTER — Encounter (HOSPITAL_COMMUNITY)
Admission: RE | Admit: 2011-03-30 | Discharge: 2011-03-30 | Disposition: A | Discharge status: Home / Self Care | Payer: PRIVATE HEALTH INSURANCE | Source: Ambulatory Visit | Attending: Cardiovascular Disease | Admitting: Cardiovascular Disease

## 2011-04-02 ENCOUNTER — Encounter (HOSPITAL_COMMUNITY)
Admission: RE | Admit: 2011-04-02 | Discharge: 2011-04-02 | Disposition: A | Discharge status: Home / Self Care | Payer: PRIVATE HEALTH INSURANCE | Source: Ambulatory Visit | Attending: Cardiovascular Disease | Admitting: Cardiovascular Disease

## 2011-04-02 NOTE — Progress Notes (Signed)
Reviewed home exercise with pt today.  Pt plans to walk, ride outdoor bike, and continue to lift weights for exercise.  Reviewed THR, pulse, RPE, sign and symptoms, NTG use (encouraged to take with him everywhere), and when to call 911 or MD.  Also, encouraged pt to maintain cellular contact while out riding and hunting in case of emergencies. Pt voiced understanding. Fabio Pierce, MA, ACSM RCEP Academic Intern Ovid Curd

## 2011-04-04 ENCOUNTER — Encounter (HOSPITAL_COMMUNITY)
Admission: RE | Admit: 2011-04-04 | Discharge: 2011-04-04 | Disposition: A | Discharge status: Home / Self Care | Payer: PRIVATE HEALTH INSURANCE | Source: Ambulatory Visit | Attending: Cardiovascular Disease | Admitting: Cardiovascular Disease

## 2011-04-06 ENCOUNTER — Encounter (HOSPITAL_COMMUNITY)
Admission: RE | Admit: 2011-04-06 | Discharge: 2011-04-06 | Disposition: A | Discharge status: Home / Self Care | Payer: PRIVATE HEALTH INSURANCE | Source: Ambulatory Visit | Attending: Cardiovascular Disease | Admitting: Cardiovascular Disease

## 2011-04-09 ENCOUNTER — Encounter (HOSPITAL_COMMUNITY)
Admission: RE | Admit: 2011-04-09 | Discharge: 2011-04-09 | Disposition: A | Discharge status: Home / Self Care | Payer: PRIVATE HEALTH INSURANCE | Source: Ambulatory Visit | Attending: Cardiovascular Disease | Admitting: Cardiovascular Disease

## 2011-04-11 ENCOUNTER — Encounter (HOSPITAL_COMMUNITY)
Admission: RE | Admit: 2011-04-11 | Discharge: 2011-04-11 | Disposition: A | Discharge status: Home / Self Care | Payer: PRIVATE HEALTH INSURANCE | Source: Ambulatory Visit | Attending: Cardiovascular Disease | Admitting: Cardiovascular Disease

## 2011-04-13 ENCOUNTER — Encounter (HOSPITAL_COMMUNITY)
Admission: RE | Admit: 2011-04-13 | Discharge: 2011-04-13 | Disposition: A | Discharge status: Home / Self Care | Payer: PRIVATE HEALTH INSURANCE | Source: Ambulatory Visit | Attending: Cardiovascular Disease | Admitting: Cardiovascular Disease

## 2011-04-13 NOTE — Progress Notes (Signed)
Marvin Carter 56 y.o. male Nutrition Note Spoke with pt.  Nutrition Plan and Nutrition Survey reviewed with pt. Pt is following Step 2 of the Therapeutic Lifestyle Changes diet. Weight loss tips and HbA1c/pre-diabetes discussed. Pt expressed understanding.  Nutrition Diagnosis   Food-and nutrition-related knowledge deficit related to lack of exposure to information as related to diagnosis of: ? CVD ? Pre-DM (A1c 5.8)    Obesity related to excessive energy intake as evidenced by a BMI of 33.7  Nutrition RX/ Estimated Daily Nutrition Needs for: wt loss  1500-2000 Kcal, 40-50 gm fat, 11-15 gm sat fat, 1.4-2.0 gm trans-fat, <1500 mg sodium  Nutrition Intervention   Pt's individual nutrition plan including cholesterol goals reviewed with pt.   Benefits of adopting Therapeutic Lifestyle Changes discussed when Medficts reviewed.   Pt to attend the Portion Distortion class   Pt to attend the  ? Nutrition I class                         ? Nutrition II class    Pt given handouts for: ? wt loss ? pre-diabetes   Continue client-centered nutrition education by RD, as part of interdisciplinary care. Goal(s)   Pt to identify food quantities necessary to achieve: ? wt loss to a goal wt of 190-202 lb (86.4-91.8 kg) at graduation from cardiac rehab.  Monitor and Evaluate progress toward nutrition goal with team.  Nutrition Risk: Low

## 2011-04-16 ENCOUNTER — Encounter (HOSPITAL_COMMUNITY)
Admission: RE | Admit: 2011-04-16 | Discharge: 2011-04-16 | Disposition: A | Discharge status: Home / Self Care | Payer: PRIVATE HEALTH INSURANCE | Source: Ambulatory Visit | Attending: Cardiovascular Disease | Admitting: Cardiovascular Disease

## 2011-04-18 ENCOUNTER — Encounter (HOSPITAL_COMMUNITY)
Admission: RE | Admit: 2011-04-18 | Discharge: 2011-04-18 | Disposition: A | Discharge status: Home / Self Care | Payer: PRIVATE HEALTH INSURANCE | Source: Ambulatory Visit | Attending: Cardiovascular Disease | Admitting: Cardiovascular Disease

## 2011-04-20 ENCOUNTER — Encounter (HOSPITAL_COMMUNITY)
Admission: RE | Admit: 2011-04-20 | Discharge: 2011-04-20 | Disposition: A | Discharge status: Home / Self Care | Payer: PRIVATE HEALTH INSURANCE | Source: Ambulatory Visit | Attending: Cardiovascular Disease | Admitting: Cardiovascular Disease

## 2011-04-20 DIAGNOSIS — I10 Essential (primary) hypertension: Secondary | ICD-10-CM | POA: Insufficient documentation

## 2011-04-20 DIAGNOSIS — K219 Gastro-esophageal reflux disease without esophagitis: Secondary | ICD-10-CM | POA: Insufficient documentation

## 2011-04-20 DIAGNOSIS — I4892 Unspecified atrial flutter: Secondary | ICD-10-CM | POA: Insufficient documentation

## 2011-04-20 DIAGNOSIS — Z5189 Encounter for other specified aftercare: Secondary | ICD-10-CM | POA: Insufficient documentation

## 2011-04-20 DIAGNOSIS — I472 Ventricular tachycardia, unspecified: Secondary | ICD-10-CM | POA: Insufficient documentation

## 2011-04-20 DIAGNOSIS — Z9861 Coronary angioplasty status: Secondary | ICD-10-CM | POA: Insufficient documentation

## 2011-04-20 DIAGNOSIS — E785 Hyperlipidemia, unspecified: Secondary | ICD-10-CM | POA: Insufficient documentation

## 2011-04-20 DIAGNOSIS — I4729 Other ventricular tachycardia: Secondary | ICD-10-CM | POA: Insufficient documentation

## 2011-04-20 DIAGNOSIS — I2510 Atherosclerotic heart disease of native coronary artery without angina pectoris: Secondary | ICD-10-CM | POA: Insufficient documentation

## 2011-04-23 ENCOUNTER — Encounter (HOSPITAL_COMMUNITY): Payer: PRIVATE HEALTH INSURANCE

## 2011-04-25 ENCOUNTER — Encounter (HOSPITAL_COMMUNITY): Payer: PRIVATE HEALTH INSURANCE

## 2011-04-27 ENCOUNTER — Encounter (HOSPITAL_COMMUNITY)
Admission: RE | Admit: 2011-04-27 | Discharge: 2011-04-27 | Disposition: A | Discharge status: Home / Self Care | Payer: PRIVATE HEALTH INSURANCE | Source: Ambulatory Visit | Attending: Cardiovascular Disease | Admitting: Cardiovascular Disease

## 2011-04-27 NOTE — Progress Notes (Signed)
Pt returned to exercise today after absence due to shoulder discomfort.  Pt received cortisone injection on Tuesday.  Pt instructed to exercise lightly but pt seemed resistance to lowering workloads and progress back to his workloads.  Pt did not want to avoid the use of arms during exercise.  Pt with elevated bp during exercise but did lower at the end of exercise.

## 2011-04-30 ENCOUNTER — Encounter (HOSPITAL_COMMUNITY)
Admission: RE | Admit: 2011-04-30 | Discharge: 2011-04-30 | Disposition: A | Discharge status: Home / Self Care | Payer: PRIVATE HEALTH INSURANCE | Source: Ambulatory Visit | Attending: Cardiovascular Disease | Admitting: Cardiovascular Disease

## 2011-05-02 ENCOUNTER — Encounter (HOSPITAL_COMMUNITY)
Admission: RE | Admit: 2011-05-02 | Discharge: 2011-05-02 | Disposition: A | Discharge status: Home / Self Care | Payer: PRIVATE HEALTH INSURANCE | Source: Ambulatory Visit | Attending: Cardiovascular Disease | Admitting: Cardiovascular Disease

## 2011-05-04 ENCOUNTER — Encounter (HOSPITAL_COMMUNITY)
Admission: RE | Admit: 2011-05-04 | Discharge: 2011-05-04 | Disposition: A | Discharge status: Home / Self Care | Payer: PRIVATE HEALTH INSURANCE | Source: Ambulatory Visit | Attending: Cardiovascular Disease | Admitting: Cardiovascular Disease

## 2011-05-07 ENCOUNTER — Encounter (HOSPITAL_COMMUNITY)
Admission: RE | Admit: 2011-05-07 | Discharge: 2011-05-07 | Disposition: A | Discharge status: Home / Self Care | Payer: PRIVATE HEALTH INSURANCE | Source: Ambulatory Visit | Attending: Cardiovascular Disease | Admitting: Cardiovascular Disease

## 2011-05-09 ENCOUNTER — Encounter (HOSPITAL_COMMUNITY)
Admission: RE | Admit: 2011-05-09 | Discharge: 2011-05-09 | Disposition: A | Discharge status: Home / Self Care | Payer: PRIVATE HEALTH INSURANCE | Source: Ambulatory Visit | Attending: Cardiovascular Disease | Admitting: Cardiovascular Disease

## 2011-05-11 ENCOUNTER — Encounter (HOSPITAL_COMMUNITY)
Admission: RE | Admit: 2011-05-11 | Discharge: 2011-05-11 | Disposition: A | Discharge status: Home / Self Care | Payer: PRIVATE HEALTH INSURANCE | Source: Ambulatory Visit | Attending: Cardiovascular Disease | Admitting: Cardiovascular Disease

## 2011-05-14 ENCOUNTER — Encounter (HOSPITAL_COMMUNITY)
Admission: RE | Admit: 2011-05-14 | Discharge: 2011-05-14 | Disposition: A | Discharge status: Home / Self Care | Payer: PRIVATE HEALTH INSURANCE | Source: Ambulatory Visit | Attending: Cardiovascular Disease | Admitting: Cardiovascular Disease

## 2011-05-16 ENCOUNTER — Encounter (HOSPITAL_COMMUNITY)
Admission: RE | Admit: 2011-05-16 | Discharge: 2011-05-16 | Disposition: A | Discharge status: Home / Self Care | Payer: PRIVATE HEALTH INSURANCE | Source: Ambulatory Visit | Attending: Cardiovascular Disease | Admitting: Cardiovascular Disease

## 2011-05-18 ENCOUNTER — Encounter (HOSPITAL_COMMUNITY)
Admission: RE | Admit: 2011-05-18 | Discharge: 2011-05-18 | Disposition: A | Discharge status: Home / Self Care | Payer: PRIVATE HEALTH INSURANCE | Source: Ambulatory Visit | Attending: Cardiovascular Disease | Admitting: Cardiovascular Disease

## 2011-05-18 NOTE — Progress Notes (Signed)
Pt with extended cool down today for relaxation for his heart rate to be an acceptable range for adequate cool down.  During exercise pt pushes hard and this is reflected in his blood pressure readings during exercise.  Rehab report faxed to Dr. Hazle Coca office for review and indication of an acceptable exercise bp for this patient.

## 2011-05-21 ENCOUNTER — Encounter (HOSPITAL_COMMUNITY): Payer: 59

## 2011-05-23 ENCOUNTER — Encounter (HOSPITAL_COMMUNITY)
Admission: RE | Admit: 2011-05-23 | Discharge: 2011-05-23 | Disposition: A | Discharge status: Home / Self Care | Payer: 59 | Source: Ambulatory Visit | Attending: Cardiovascular Disease | Admitting: Cardiovascular Disease

## 2011-05-23 DIAGNOSIS — K219 Gastro-esophageal reflux disease without esophagitis: Secondary | ICD-10-CM | POA: Insufficient documentation

## 2011-05-23 DIAGNOSIS — I472 Ventricular tachycardia, unspecified: Secondary | ICD-10-CM | POA: Insufficient documentation

## 2011-05-23 DIAGNOSIS — E785 Hyperlipidemia, unspecified: Secondary | ICD-10-CM | POA: Insufficient documentation

## 2011-05-23 DIAGNOSIS — I4892 Unspecified atrial flutter: Secondary | ICD-10-CM | POA: Insufficient documentation

## 2011-05-23 DIAGNOSIS — I4729 Other ventricular tachycardia: Secondary | ICD-10-CM | POA: Insufficient documentation

## 2011-05-23 DIAGNOSIS — Z9861 Coronary angioplasty status: Secondary | ICD-10-CM | POA: Insufficient documentation

## 2011-05-23 DIAGNOSIS — I2510 Atherosclerotic heart disease of native coronary artery without angina pectoris: Secondary | ICD-10-CM | POA: Insufficient documentation

## 2011-05-23 DIAGNOSIS — Z5189 Encounter for other specified aftercare: Secondary | ICD-10-CM | POA: Insufficient documentation

## 2011-05-23 DIAGNOSIS — I10 Essential (primary) hypertension: Secondary | ICD-10-CM | POA: Insufficient documentation

## 2011-05-25 ENCOUNTER — Encounter (HOSPITAL_COMMUNITY)
Admission: RE | Admit: 2011-05-25 | Discharge: 2011-05-25 | Disposition: A | Discharge status: Home / Self Care | Payer: 59 | Source: Ambulatory Visit | Attending: Cardiovascular Disease | Admitting: Cardiovascular Disease

## 2011-05-28 ENCOUNTER — Encounter (HOSPITAL_COMMUNITY)
Admission: RE | Admit: 2011-05-28 | Discharge: 2011-05-28 | Disposition: A | Discharge status: Home / Self Care | Payer: 59 | Source: Ambulatory Visit | Attending: Cardiovascular Disease | Admitting: Cardiovascular Disease

## 2011-05-30 ENCOUNTER — Encounter (HOSPITAL_COMMUNITY)
Admission: RE | Admit: 2011-05-30 | Discharge: 2011-05-30 | Disposition: A | Discharge status: Home / Self Care | Payer: 59 | Source: Ambulatory Visit | Attending: Cardiovascular Disease | Admitting: Cardiovascular Disease

## 2011-06-01 ENCOUNTER — Encounter (HOSPITAL_COMMUNITY)
Admission: RE | Admit: 2011-06-01 | Discharge: 2011-06-01 | Disposition: A | Discharge status: Home / Self Care | Payer: 59 | Source: Ambulatory Visit | Attending: Cardiovascular Disease | Admitting: Cardiovascular Disease

## 2011-06-04 ENCOUNTER — Encounter (HOSPITAL_COMMUNITY)
Admission: RE | Admit: 2011-06-04 | Discharge: 2011-06-04 | Disposition: A | Discharge status: Home / Self Care | Payer: 59 | Source: Ambulatory Visit | Attending: Cardiovascular Disease | Admitting: Cardiovascular Disease

## 2011-06-06 ENCOUNTER — Encounter (HOSPITAL_COMMUNITY)
Admission: RE | Admit: 2011-06-06 | Discharge: 2011-06-06 | Disposition: A | Discharge status: Home / Self Care | Payer: 59 | Source: Ambulatory Visit | Attending: Cardiovascular Disease | Admitting: Cardiovascular Disease

## 2011-06-08 ENCOUNTER — Encounter (HOSPITAL_COMMUNITY)
Admission: RE | Admit: 2011-06-08 | Discharge: 2011-06-08 | Disposition: A | Discharge status: Home / Self Care | Payer: 59 | Source: Ambulatory Visit | Attending: Cardiovascular Disease | Admitting: Cardiovascular Disease

## 2011-06-08 NOTE — Progress Notes (Signed)
Blood pressure 204/60 during bike testing recheck blood pressure 160/60. Marvin Carter works out very hard.  Encouraged patient to tone down his exercise today and not use the Rower.  Subsequent blood pressure improved. Exit blood pressure 122/60.  Dr Hazle Coca office called and notified.  Spoke with Burna Mortimer.  Exercise flow sheets faxed to Dr Hazle Coca office for review. Marvin Carter left cardiac rehab without complaints.

## 2011-06-11 ENCOUNTER — Encounter (HOSPITAL_COMMUNITY)
Admission: RE | Admit: 2011-06-11 | Discharge: 2011-06-11 | Disposition: A | Discharge status: Home / Self Care | Payer: 59 | Source: Ambulatory Visit | Attending: Cardiovascular Disease | Admitting: Cardiovascular Disease

## 2011-06-13 ENCOUNTER — Encounter (HOSPITAL_COMMUNITY)
Admission: RE | Admit: 2011-06-13 | Discharge: 2011-06-13 | Disposition: A | Discharge status: Home / Self Care | Payer: 59 | Source: Ambulatory Visit | Attending: Cardiovascular Disease | Admitting: Cardiovascular Disease

## 2011-06-13 NOTE — Progress Notes (Signed)
During the first minutes of station #2 .  Pt complained of right sided chest/shouldar discomfort.  Exercise stopped and assessment completed.  Pt felt it to be musculoskeletal in nature due to previous injury and the amount of exertion and intensity he exercises.  Pt resumed exercise with the avoidance of arms an switched to treadmill verses the rower.

## 2011-06-15 ENCOUNTER — Encounter (HOSPITAL_COMMUNITY)
Admission: RE | Admit: 2011-06-15 | Discharge: 2011-06-15 | Disposition: A | Discharge status: Home / Self Care | Payer: 59 | Source: Ambulatory Visit | Attending: Cardiovascular Disease | Admitting: Cardiovascular Disease

## 2011-06-18 ENCOUNTER — Encounter (HOSPITAL_COMMUNITY)
Admission: RE | Admit: 2011-06-18 | Discharge: 2011-06-18 | Disposition: A | Discharge status: Home / Self Care | Payer: 59 | Source: Ambulatory Visit | Attending: Cardiovascular Disease | Admitting: Cardiovascular Disease

## 2011-06-20 ENCOUNTER — Encounter (HOSPITAL_COMMUNITY)
Admission: RE | Admit: 2011-06-20 | Discharge: 2011-06-20 | Disposition: A | Discharge status: Home / Self Care | Payer: 59 | Source: Ambulatory Visit | Attending: Cardiovascular Disease | Admitting: Cardiovascular Disease

## 2011-06-20 DIAGNOSIS — I10 Essential (primary) hypertension: Secondary | ICD-10-CM | POA: Insufficient documentation

## 2011-06-20 DIAGNOSIS — I472 Ventricular tachycardia, unspecified: Secondary | ICD-10-CM | POA: Insufficient documentation

## 2011-06-20 DIAGNOSIS — Z5189 Encounter for other specified aftercare: Secondary | ICD-10-CM | POA: Insufficient documentation

## 2011-06-20 DIAGNOSIS — K219 Gastro-esophageal reflux disease without esophagitis: Secondary | ICD-10-CM | POA: Insufficient documentation

## 2011-06-20 DIAGNOSIS — E785 Hyperlipidemia, unspecified: Secondary | ICD-10-CM | POA: Insufficient documentation

## 2011-06-20 DIAGNOSIS — I4729 Other ventricular tachycardia: Secondary | ICD-10-CM | POA: Insufficient documentation

## 2011-06-20 DIAGNOSIS — I4892 Unspecified atrial flutter: Secondary | ICD-10-CM | POA: Insufficient documentation

## 2011-06-20 DIAGNOSIS — Z9861 Coronary angioplasty status: Secondary | ICD-10-CM | POA: Insufficient documentation

## 2011-06-20 DIAGNOSIS — I2510 Atherosclerotic heart disease of native coronary artery without angina pectoris: Secondary | ICD-10-CM | POA: Insufficient documentation

## 2011-06-22 ENCOUNTER — Encounter (HOSPITAL_COMMUNITY)
Admission: RE | Admit: 2011-06-22 | Discharge: 2011-06-22 | Disposition: A | Discharge status: Home / Self Care | Payer: 59 | Source: Ambulatory Visit | Attending: Cardiovascular Disease | Admitting: Cardiovascular Disease

## 2011-06-22 NOTE — Progress Notes (Signed)
Pt in for his last day of exercise.  Pt reported that during his physical therapy he developed "chest tightness".  Pt was doing push up while doing incline level blocks that were varied in height.  Therapist believed it was musculoskeletal in nature since it hurt only if he flexed his pectoral muscle.  Pt denies any complaints now and was able tolerated exercise at modified level due to fatigue from therapy.  Pt plans to continue home exercise with the maintenance program here at [facility].

## 2011-06-25 ENCOUNTER — Encounter (HOSPITAL_COMMUNITY)
Admission: RE | Admit: 2011-06-25 | Discharge: 2011-06-25 | Disposition: A | Discharge status: Home / Self Care | Payer: 59 | Source: Ambulatory Visit | Attending: Cardiovascular Disease | Admitting: Cardiovascular Disease

## 2011-06-25 ENCOUNTER — Encounter (HOSPITAL_COMMUNITY): Payer: 59

## 2011-06-25 DIAGNOSIS — I4892 Unspecified atrial flutter: Secondary | ICD-10-CM | POA: Insufficient documentation

## 2011-06-25 DIAGNOSIS — E785 Hyperlipidemia, unspecified: Secondary | ICD-10-CM | POA: Insufficient documentation

## 2011-06-25 DIAGNOSIS — I472 Ventricular tachycardia, unspecified: Secondary | ICD-10-CM | POA: Insufficient documentation

## 2011-06-25 DIAGNOSIS — Z5189 Encounter for other specified aftercare: Secondary | ICD-10-CM | POA: Insufficient documentation

## 2011-06-25 DIAGNOSIS — Z9861 Coronary angioplasty status: Secondary | ICD-10-CM | POA: Insufficient documentation

## 2011-06-25 DIAGNOSIS — I10 Essential (primary) hypertension: Secondary | ICD-10-CM | POA: Insufficient documentation

## 2011-06-25 DIAGNOSIS — I4729 Other ventricular tachycardia: Secondary | ICD-10-CM | POA: Insufficient documentation

## 2011-06-25 DIAGNOSIS — I2510 Atherosclerotic heart disease of native coronary artery without angina pectoris: Secondary | ICD-10-CM | POA: Insufficient documentation

## 2011-06-25 DIAGNOSIS — K219 Gastro-esophageal reflux disease without esophagitis: Secondary | ICD-10-CM | POA: Insufficient documentation

## 2011-06-27 ENCOUNTER — Encounter (HOSPITAL_COMMUNITY): Payer: 59

## 2011-06-27 ENCOUNTER — Encounter (HOSPITAL_COMMUNITY)
Admission: RE | Admit: 2011-06-27 | Discharge: 2011-06-27 | Disposition: A | Discharge status: Home / Self Care | Payer: 59 | Source: Ambulatory Visit | Attending: Cardiovascular Disease | Admitting: Cardiovascular Disease

## 2011-06-29 ENCOUNTER — Encounter (HOSPITAL_COMMUNITY): Payer: 59

## 2011-07-02 ENCOUNTER — Encounter (HOSPITAL_COMMUNITY): Payer: 59

## 2011-07-04 ENCOUNTER — Encounter (HOSPITAL_COMMUNITY)
Admission: RE | Admit: 2011-07-04 | Discharge: 2011-07-04 | Disposition: A | Discharge status: Home / Self Care | Payer: 59 | Source: Ambulatory Visit | Attending: Cardiovascular Disease | Admitting: Cardiovascular Disease

## 2011-07-06 ENCOUNTER — Encounter (HOSPITAL_COMMUNITY): Admission: RE | Admit: 2011-07-06 | Payer: 59 | Source: Ambulatory Visit

## 2011-07-09 ENCOUNTER — Encounter (HOSPITAL_COMMUNITY)
Admission: RE | Admit: 2011-07-09 | Discharge: 2011-07-09 | Disposition: A | Discharge status: Home / Self Care | Payer: 59 | Source: Ambulatory Visit | Attending: Cardiovascular Disease | Admitting: Cardiovascular Disease

## 2011-07-11 ENCOUNTER — Encounter (HOSPITAL_COMMUNITY)
Admission: RE | Admit: 2011-07-11 | Discharge: 2011-07-11 | Disposition: A | Discharge status: Home / Self Care | Payer: 59 | Source: Ambulatory Visit | Attending: Cardiovascular Disease | Admitting: Cardiovascular Disease

## 2011-07-13 ENCOUNTER — Encounter (HOSPITAL_COMMUNITY)
Admission: RE | Admit: 2011-07-13 | Discharge: 2011-07-13 | Disposition: A | Discharge status: Home / Self Care | Payer: 59 | Source: Ambulatory Visit | Attending: Cardiovascular Disease | Admitting: Cardiovascular Disease

## 2011-07-18 ENCOUNTER — Encounter (HOSPITAL_COMMUNITY)
Admission: RE | Admit: 2011-07-18 | Discharge: 2011-07-18 | Disposition: A | Discharge status: Home / Self Care | Payer: 59 | Source: Ambulatory Visit | Attending: Cardiovascular Disease | Admitting: Cardiovascular Disease

## 2011-07-20 ENCOUNTER — Encounter (HOSPITAL_COMMUNITY)
Admission: RE | Admit: 2011-07-20 | Discharge: 2011-07-20 | Disposition: A | Discharge status: Home / Self Care | Payer: 59 | Source: Ambulatory Visit | Attending: Cardiovascular Disease | Admitting: Cardiovascular Disease

## 2011-07-23 ENCOUNTER — Encounter (HOSPITAL_COMMUNITY)
Admission: RE | Admit: 2011-07-23 | Discharge: 2011-07-23 | Disposition: A | Discharge status: Home / Self Care | Payer: 59 | Source: Ambulatory Visit | Attending: Cardiovascular Disease | Admitting: Cardiovascular Disease

## 2011-07-23 DIAGNOSIS — I2510 Atherosclerotic heart disease of native coronary artery without angina pectoris: Secondary | ICD-10-CM | POA: Insufficient documentation

## 2011-07-23 DIAGNOSIS — E785 Hyperlipidemia, unspecified: Secondary | ICD-10-CM | POA: Insufficient documentation

## 2011-07-23 DIAGNOSIS — I4729 Other ventricular tachycardia: Secondary | ICD-10-CM | POA: Insufficient documentation

## 2011-07-23 DIAGNOSIS — I472 Ventricular tachycardia, unspecified: Secondary | ICD-10-CM | POA: Insufficient documentation

## 2011-07-23 DIAGNOSIS — K219 Gastro-esophageal reflux disease without esophagitis: Secondary | ICD-10-CM | POA: Insufficient documentation

## 2011-07-23 DIAGNOSIS — I4892 Unspecified atrial flutter: Secondary | ICD-10-CM | POA: Insufficient documentation

## 2011-07-23 DIAGNOSIS — Z9861 Coronary angioplasty status: Secondary | ICD-10-CM | POA: Insufficient documentation

## 2011-07-23 DIAGNOSIS — Z5189 Encounter for other specified aftercare: Secondary | ICD-10-CM | POA: Insufficient documentation

## 2011-07-23 DIAGNOSIS — I10 Essential (primary) hypertension: Secondary | ICD-10-CM | POA: Insufficient documentation

## 2011-07-25 ENCOUNTER — Encounter (HOSPITAL_COMMUNITY): Payer: 59

## 2011-07-27 ENCOUNTER — Encounter (HOSPITAL_COMMUNITY)
Admission: RE | Admit: 2011-07-27 | Discharge: 2011-07-27 | Disposition: A | Discharge status: Home / Self Care | Payer: 59 | Source: Ambulatory Visit | Attending: Cardiovascular Disease | Admitting: Cardiovascular Disease

## 2011-07-30 ENCOUNTER — Encounter (HOSPITAL_COMMUNITY)
Admission: RE | Admit: 2011-07-30 | Discharge: 2011-07-30 | Disposition: A | Discharge status: Home / Self Care | Payer: 59 | Source: Ambulatory Visit | Attending: Cardiovascular Disease | Admitting: Cardiovascular Disease

## 2011-08-01 ENCOUNTER — Encounter (HOSPITAL_COMMUNITY)
Admission: RE | Admit: 2011-08-01 | Discharge: 2011-08-01 | Disposition: A | Discharge status: Home / Self Care | Payer: 59 | Source: Ambulatory Visit | Attending: Cardiovascular Disease | Admitting: Cardiovascular Disease

## 2011-08-03 ENCOUNTER — Encounter (HOSPITAL_COMMUNITY)
Admission: RE | Admit: 2011-08-03 | Discharge: 2011-08-03 | Disposition: A | Discharge status: Home / Self Care | Payer: 59 | Source: Ambulatory Visit | Attending: Cardiovascular Disease | Admitting: Cardiovascular Disease

## 2011-08-06 ENCOUNTER — Encounter (HOSPITAL_COMMUNITY)
Admission: RE | Admit: 2011-08-06 | Discharge: 2011-08-06 | Disposition: A | Discharge status: Home / Self Care | Payer: 59 | Source: Ambulatory Visit | Attending: Cardiovascular Disease | Admitting: Cardiovascular Disease

## 2011-08-08 ENCOUNTER — Encounter (HOSPITAL_COMMUNITY)
Admission: RE | Admit: 2011-08-08 | Discharge: 2011-08-08 | Disposition: A | Discharge status: Home / Self Care | Payer: 59 | Source: Ambulatory Visit | Attending: Cardiovascular Disease | Admitting: Cardiovascular Disease

## 2011-08-10 ENCOUNTER — Encounter (HOSPITAL_COMMUNITY)
Admission: RE | Admit: 2011-08-10 | Discharge: 2011-08-10 | Disposition: A | Discharge status: Home / Self Care | Payer: 59 | Source: Ambulatory Visit | Attending: Cardiovascular Disease | Admitting: Cardiovascular Disease

## 2011-08-13 ENCOUNTER — Encounter (HOSPITAL_COMMUNITY)
Admission: RE | Admit: 2011-08-13 | Discharge: 2011-08-13 | Disposition: A | Discharge status: Home / Self Care | Payer: 59 | Source: Ambulatory Visit | Attending: Cardiovascular Disease | Admitting: Cardiovascular Disease

## 2011-08-15 ENCOUNTER — Encounter (HOSPITAL_COMMUNITY)
Admission: RE | Admit: 2011-08-15 | Discharge: 2011-08-15 | Disposition: A | Discharge status: Home / Self Care | Payer: 59 | Source: Ambulatory Visit | Attending: Cardiovascular Disease | Admitting: Cardiovascular Disease

## 2011-08-17 ENCOUNTER — Encounter (HOSPITAL_COMMUNITY)
Admission: RE | Admit: 2011-08-17 | Discharge: 2011-08-17 | Disposition: A | Discharge status: Home / Self Care | Payer: 59 | Source: Ambulatory Visit | Attending: Cardiovascular Disease | Admitting: Cardiovascular Disease

## 2011-08-20 ENCOUNTER — Encounter (HOSPITAL_COMMUNITY)
Admission: RE | Admit: 2011-08-20 | Discharge: 2011-08-20 | Disposition: A | Discharge status: Home / Self Care | Payer: 59 | Source: Ambulatory Visit | Attending: Cardiovascular Disease | Admitting: Cardiovascular Disease

## 2011-08-20 DIAGNOSIS — I2510 Atherosclerotic heart disease of native coronary artery without angina pectoris: Secondary | ICD-10-CM | POA: Insufficient documentation

## 2011-08-20 DIAGNOSIS — I4892 Unspecified atrial flutter: Secondary | ICD-10-CM | POA: Insufficient documentation

## 2011-08-20 DIAGNOSIS — E785 Hyperlipidemia, unspecified: Secondary | ICD-10-CM | POA: Insufficient documentation

## 2011-08-20 DIAGNOSIS — I4729 Other ventricular tachycardia: Secondary | ICD-10-CM | POA: Insufficient documentation

## 2011-08-20 DIAGNOSIS — I472 Ventricular tachycardia, unspecified: Secondary | ICD-10-CM | POA: Insufficient documentation

## 2011-08-20 DIAGNOSIS — Z5189 Encounter for other specified aftercare: Secondary | ICD-10-CM | POA: Insufficient documentation

## 2011-08-20 DIAGNOSIS — Z9861 Coronary angioplasty status: Secondary | ICD-10-CM | POA: Insufficient documentation

## 2011-08-20 DIAGNOSIS — K219 Gastro-esophageal reflux disease without esophagitis: Secondary | ICD-10-CM | POA: Insufficient documentation

## 2011-08-20 DIAGNOSIS — I10 Essential (primary) hypertension: Secondary | ICD-10-CM | POA: Insufficient documentation

## 2011-08-22 ENCOUNTER — Encounter (HOSPITAL_COMMUNITY)
Admission: RE | Admit: 2011-08-22 | Discharge: 2011-08-22 | Disposition: A | Discharge status: Home / Self Care | Payer: 59 | Source: Ambulatory Visit | Attending: Cardiovascular Disease | Admitting: Cardiovascular Disease

## 2011-08-24 ENCOUNTER — Encounter (HOSPITAL_COMMUNITY)
Admission: RE | Admit: 2011-08-24 | Discharge: 2011-08-24 | Disposition: A | Discharge status: Home / Self Care | Payer: 59 | Source: Ambulatory Visit | Attending: Cardiovascular Disease | Admitting: Cardiovascular Disease

## 2011-08-27 ENCOUNTER — Encounter (HOSPITAL_COMMUNITY)
Admission: RE | Admit: 2011-08-27 | Discharge: 2011-08-27 | Disposition: A | Discharge status: Home / Self Care | Payer: 59 | Source: Ambulatory Visit | Attending: Cardiovascular Disease | Admitting: Cardiovascular Disease

## 2011-08-29 ENCOUNTER — Encounter (HOSPITAL_COMMUNITY)
Admission: RE | Admit: 2011-08-29 | Discharge: 2011-08-29 | Disposition: A | Discharge status: Home / Self Care | Payer: 59 | Source: Ambulatory Visit | Attending: Cardiovascular Disease | Admitting: Cardiovascular Disease

## 2011-08-31 ENCOUNTER — Encounter (HOSPITAL_COMMUNITY): Payer: 59

## 2011-09-03 ENCOUNTER — Encounter (HOSPITAL_COMMUNITY)
Admission: RE | Admit: 2011-09-03 | Discharge: 2011-09-03 | Disposition: A | Discharge status: Home / Self Care | Payer: 59 | Source: Ambulatory Visit | Attending: Cardiovascular Disease | Admitting: Cardiovascular Disease

## 2011-09-05 ENCOUNTER — Encounter (HOSPITAL_COMMUNITY)
Admission: RE | Admit: 2011-09-05 | Discharge: 2011-09-05 | Disposition: A | Discharge status: Home / Self Care | Payer: 59 | Source: Ambulatory Visit | Attending: Cardiovascular Disease | Admitting: Cardiovascular Disease

## 2011-09-07 ENCOUNTER — Encounter (HOSPITAL_COMMUNITY)
Admission: RE | Admit: 2011-09-07 | Discharge: 2011-09-07 | Disposition: A | Discharge status: Home / Self Care | Payer: 59 | Source: Ambulatory Visit | Attending: Cardiovascular Disease | Admitting: Cardiovascular Disease

## 2011-09-10 ENCOUNTER — Encounter (HOSPITAL_COMMUNITY)
Admission: RE | Admit: 2011-09-10 | Discharge: 2011-09-10 | Disposition: A | Discharge status: Home / Self Care | Payer: 59 | Source: Ambulatory Visit | Attending: Cardiovascular Disease | Admitting: Cardiovascular Disease

## 2011-09-12 ENCOUNTER — Encounter (HOSPITAL_COMMUNITY)
Admission: RE | Admit: 2011-09-12 | Discharge: 2011-09-12 | Disposition: A | Discharge status: Home / Self Care | Payer: 59 | Source: Ambulatory Visit | Attending: Cardiovascular Disease | Admitting: Cardiovascular Disease

## 2011-09-14 ENCOUNTER — Encounter (HOSPITAL_COMMUNITY)
Admission: RE | Admit: 2011-09-14 | Discharge: 2011-09-14 | Disposition: A | Discharge status: Home / Self Care | Payer: 59 | Source: Ambulatory Visit | Attending: Cardiovascular Disease | Admitting: Cardiovascular Disease

## 2011-09-17 ENCOUNTER — Encounter (HOSPITAL_COMMUNITY)
Admission: RE | Admit: 2011-09-17 | Discharge: 2011-09-17 | Disposition: A | Discharge status: Home / Self Care | Payer: 59 | Source: Ambulatory Visit | Attending: Cardiovascular Disease | Admitting: Cardiovascular Disease

## 2011-09-19 ENCOUNTER — Encounter (HOSPITAL_COMMUNITY)
Admission: RE | Admit: 2011-09-19 | Discharge: 2011-09-19 | Disposition: A | Discharge status: Home / Self Care | Payer: 59 | Source: Ambulatory Visit | Attending: Cardiovascular Disease | Admitting: Cardiovascular Disease

## 2011-09-21 ENCOUNTER — Encounter (HOSPITAL_COMMUNITY)
Admission: RE | Admit: 2011-09-21 | Discharge: 2011-09-21 | Disposition: A | Discharge status: Home / Self Care | Payer: Self-pay | Source: Ambulatory Visit | Attending: Cardiovascular Disease | Admitting: Cardiovascular Disease

## 2011-09-21 DIAGNOSIS — K219 Gastro-esophageal reflux disease without esophagitis: Secondary | ICD-10-CM | POA: Insufficient documentation

## 2011-09-21 DIAGNOSIS — I4892 Unspecified atrial flutter: Secondary | ICD-10-CM | POA: Insufficient documentation

## 2011-09-21 DIAGNOSIS — I472 Ventricular tachycardia, unspecified: Secondary | ICD-10-CM | POA: Insufficient documentation

## 2011-09-21 DIAGNOSIS — I2510 Atherosclerotic heart disease of native coronary artery without angina pectoris: Secondary | ICD-10-CM | POA: Insufficient documentation

## 2011-09-21 DIAGNOSIS — I4729 Other ventricular tachycardia: Secondary | ICD-10-CM | POA: Insufficient documentation

## 2011-09-21 DIAGNOSIS — E785 Hyperlipidemia, unspecified: Secondary | ICD-10-CM | POA: Insufficient documentation

## 2011-09-21 DIAGNOSIS — I10 Essential (primary) hypertension: Secondary | ICD-10-CM | POA: Insufficient documentation

## 2011-09-21 DIAGNOSIS — Z9861 Coronary angioplasty status: Secondary | ICD-10-CM | POA: Insufficient documentation

## 2011-09-21 DIAGNOSIS — Z5189 Encounter for other specified aftercare: Secondary | ICD-10-CM | POA: Insufficient documentation

## 2011-09-24 ENCOUNTER — Encounter (HOSPITAL_COMMUNITY)
Admission: RE | Admit: 2011-09-24 | Discharge: 2011-09-24 | Disposition: A | Discharge status: Home / Self Care | Payer: Self-pay | Source: Ambulatory Visit | Attending: Cardiovascular Disease | Admitting: Cardiovascular Disease

## 2011-09-26 ENCOUNTER — Encounter (HOSPITAL_COMMUNITY)
Admission: RE | Admit: 2011-09-26 | Discharge: 2011-09-26 | Disposition: A | Discharge status: Home / Self Care | Payer: Self-pay | Source: Ambulatory Visit | Attending: Cardiovascular Disease | Admitting: Cardiovascular Disease

## 2011-09-28 ENCOUNTER — Encounter (HOSPITAL_COMMUNITY)
Admission: RE | Admit: 2011-09-28 | Discharge: 2011-09-28 | Disposition: A | Discharge status: Home / Self Care | Payer: Self-pay | Source: Ambulatory Visit | Attending: Cardiovascular Disease | Admitting: Cardiovascular Disease

## 2011-10-01 ENCOUNTER — Encounter (HOSPITAL_COMMUNITY)
Admission: RE | Admit: 2011-10-01 | Discharge: 2011-10-01 | Disposition: A | Discharge status: Home / Self Care | Payer: Self-pay | Source: Ambulatory Visit | Attending: Cardiovascular Disease | Admitting: Cardiovascular Disease

## 2011-10-03 ENCOUNTER — Encounter (HOSPITAL_COMMUNITY)
Admission: RE | Admit: 2011-10-03 | Discharge: 2011-10-03 | Disposition: A | Discharge status: Home / Self Care | Payer: Self-pay | Source: Ambulatory Visit | Attending: Cardiovascular Disease | Admitting: Cardiovascular Disease

## 2011-10-05 ENCOUNTER — Encounter (HOSPITAL_COMMUNITY): Payer: Self-pay

## 2011-10-08 ENCOUNTER — Encounter (HOSPITAL_COMMUNITY)
Admission: RE | Admit: 2011-10-08 | Discharge: 2011-10-08 | Disposition: A | Discharge status: Home / Self Care | Payer: Self-pay | Source: Ambulatory Visit | Attending: Cardiovascular Disease | Admitting: Cardiovascular Disease

## 2011-10-10 ENCOUNTER — Encounter (HOSPITAL_COMMUNITY)
Admission: RE | Admit: 2011-10-10 | Discharge: 2011-10-10 | Disposition: A | Discharge status: Home / Self Care | Payer: Self-pay | Source: Ambulatory Visit | Attending: Cardiovascular Disease | Admitting: Cardiovascular Disease

## 2011-10-12 ENCOUNTER — Encounter (HOSPITAL_COMMUNITY)
Admission: RE | Admit: 2011-10-12 | Discharge: 2011-10-12 | Disposition: A | Discharge status: Home / Self Care | Payer: Self-pay | Source: Ambulatory Visit | Attending: Cardiovascular Disease | Admitting: Cardiovascular Disease

## 2011-10-15 ENCOUNTER — Encounter (HOSPITAL_COMMUNITY): Payer: Self-pay

## 2011-10-17 ENCOUNTER — Encounter (HOSPITAL_COMMUNITY)
Admission: RE | Admit: 2011-10-17 | Discharge: 2011-10-17 | Disposition: A | Discharge status: Home / Self Care | Payer: Self-pay | Source: Ambulatory Visit | Attending: Cardiovascular Disease | Admitting: Cardiovascular Disease

## 2011-10-19 ENCOUNTER — Encounter (HOSPITAL_COMMUNITY)
Admission: RE | Admit: 2011-10-19 | Discharge: 2011-10-19 | Disposition: A | Discharge status: Home / Self Care | Payer: Self-pay | Source: Ambulatory Visit | Attending: Cardiovascular Disease | Admitting: Cardiovascular Disease

## 2011-10-24 ENCOUNTER — Encounter (HOSPITAL_COMMUNITY)
Admission: RE | Admit: 2011-10-24 | Discharge: 2011-10-24 | Disposition: A | Discharge status: Home / Self Care | Payer: 59 | Source: Ambulatory Visit | Attending: Cardiovascular Disease | Admitting: Cardiovascular Disease

## 2011-10-24 DIAGNOSIS — I2510 Atherosclerotic heart disease of native coronary artery without angina pectoris: Secondary | ICD-10-CM | POA: Insufficient documentation

## 2011-10-24 DIAGNOSIS — Z5189 Encounter for other specified aftercare: Secondary | ICD-10-CM | POA: Insufficient documentation

## 2011-10-24 DIAGNOSIS — E785 Hyperlipidemia, unspecified: Secondary | ICD-10-CM | POA: Insufficient documentation

## 2011-10-24 DIAGNOSIS — I4729 Other ventricular tachycardia: Secondary | ICD-10-CM | POA: Insufficient documentation

## 2011-10-24 DIAGNOSIS — K219 Gastro-esophageal reflux disease without esophagitis: Secondary | ICD-10-CM | POA: Insufficient documentation

## 2011-10-24 DIAGNOSIS — Z9861 Coronary angioplasty status: Secondary | ICD-10-CM | POA: Insufficient documentation

## 2011-10-24 DIAGNOSIS — I472 Ventricular tachycardia, unspecified: Secondary | ICD-10-CM | POA: Insufficient documentation

## 2011-10-24 DIAGNOSIS — I4892 Unspecified atrial flutter: Secondary | ICD-10-CM | POA: Insufficient documentation

## 2011-10-24 DIAGNOSIS — I10 Essential (primary) hypertension: Secondary | ICD-10-CM | POA: Insufficient documentation

## 2011-10-26 ENCOUNTER — Encounter (HOSPITAL_COMMUNITY)
Admission: RE | Admit: 2011-10-26 | Discharge: 2011-10-26 | Disposition: A | Discharge status: Home / Self Care | Payer: 59 | Source: Ambulatory Visit | Attending: Cardiovascular Disease | Admitting: Cardiovascular Disease

## 2011-10-29 ENCOUNTER — Encounter (HOSPITAL_COMMUNITY)
Admission: RE | Admit: 2011-10-29 | Discharge: 2011-10-29 | Disposition: A | Discharge status: Home / Self Care | Payer: 59 | Source: Ambulatory Visit | Attending: Cardiovascular Disease | Admitting: Cardiovascular Disease

## 2011-10-31 ENCOUNTER — Encounter (HOSPITAL_COMMUNITY)
Admission: RE | Admit: 2011-10-31 | Discharge: 2011-10-31 | Disposition: A | Discharge status: Home / Self Care | Payer: 59 | Source: Ambulatory Visit | Attending: Cardiovascular Disease | Admitting: Cardiovascular Disease

## 2011-11-02 ENCOUNTER — Encounter (HOSPITAL_COMMUNITY)
Admission: RE | Admit: 2011-11-02 | Discharge: 2011-11-02 | Disposition: A | Discharge status: Home / Self Care | Payer: 59 | Source: Ambulatory Visit | Attending: Cardiovascular Disease | Admitting: Cardiovascular Disease

## 2011-11-05 ENCOUNTER — Encounter (HOSPITAL_COMMUNITY)
Admission: RE | Admit: 2011-11-05 | Discharge: 2011-11-05 | Disposition: A | Discharge status: Home / Self Care | Payer: 59 | Source: Ambulatory Visit | Attending: Cardiovascular Disease | Admitting: Cardiovascular Disease

## 2011-11-07 ENCOUNTER — Encounter (HOSPITAL_COMMUNITY)
Admission: RE | Admit: 2011-11-07 | Discharge: 2011-11-07 | Disposition: A | Discharge status: Home / Self Care | Payer: 59 | Source: Ambulatory Visit | Attending: Cardiovascular Disease | Admitting: Cardiovascular Disease

## 2011-11-09 ENCOUNTER — Encounter (HOSPITAL_COMMUNITY)
Admission: RE | Admit: 2011-11-09 | Discharge: 2011-11-09 | Disposition: A | Discharge status: Home / Self Care | Payer: 59 | Source: Ambulatory Visit | Attending: Cardiovascular Disease | Admitting: Cardiovascular Disease

## 2011-11-12 ENCOUNTER — Encounter (HOSPITAL_COMMUNITY)
Admission: RE | Admit: 2011-11-12 | Discharge: 2011-11-12 | Disposition: A | Discharge status: Home / Self Care | Payer: 59 | Source: Ambulatory Visit | Attending: Cardiovascular Disease | Admitting: Cardiovascular Disease

## 2011-11-14 ENCOUNTER — Encounter (HOSPITAL_COMMUNITY): Payer: 59

## 2011-11-16 ENCOUNTER — Encounter (HOSPITAL_COMMUNITY)
Admission: RE | Admit: 2011-11-16 | Discharge: 2011-11-16 | Disposition: A | Discharge status: Home / Self Care | Payer: 59 | Source: Ambulatory Visit | Attending: Cardiovascular Disease | Admitting: Cardiovascular Disease

## 2011-11-19 ENCOUNTER — Encounter (HOSPITAL_COMMUNITY)
Admission: RE | Admit: 2011-11-19 | Discharge: 2011-11-19 | Disposition: A | Discharge status: Home / Self Care | Payer: 59 | Source: Ambulatory Visit | Attending: Cardiovascular Disease | Admitting: Cardiovascular Disease

## 2011-11-21 ENCOUNTER — Encounter (HOSPITAL_COMMUNITY)
Admission: RE | Admit: 2011-11-21 | Discharge: 2011-11-21 | Disposition: A | Discharge status: Home / Self Care | Payer: 59 | Source: Ambulatory Visit | Attending: Cardiovascular Disease | Admitting: Cardiovascular Disease

## 2011-11-21 DIAGNOSIS — I472 Ventricular tachycardia, unspecified: Secondary | ICD-10-CM | POA: Insufficient documentation

## 2011-11-21 DIAGNOSIS — I2510 Atherosclerotic heart disease of native coronary artery without angina pectoris: Secondary | ICD-10-CM | POA: Insufficient documentation

## 2011-11-21 DIAGNOSIS — I4892 Unspecified atrial flutter: Secondary | ICD-10-CM | POA: Insufficient documentation

## 2011-11-21 DIAGNOSIS — I4729 Other ventricular tachycardia: Secondary | ICD-10-CM | POA: Insufficient documentation

## 2011-11-21 DIAGNOSIS — K219 Gastro-esophageal reflux disease without esophagitis: Secondary | ICD-10-CM | POA: Insufficient documentation

## 2011-11-21 DIAGNOSIS — Z5189 Encounter for other specified aftercare: Secondary | ICD-10-CM | POA: Insufficient documentation

## 2011-11-21 DIAGNOSIS — E785 Hyperlipidemia, unspecified: Secondary | ICD-10-CM | POA: Insufficient documentation

## 2011-11-21 DIAGNOSIS — I10 Essential (primary) hypertension: Secondary | ICD-10-CM | POA: Insufficient documentation

## 2011-11-21 DIAGNOSIS — Z9861 Coronary angioplasty status: Secondary | ICD-10-CM | POA: Insufficient documentation

## 2011-11-23 ENCOUNTER — Encounter (HOSPITAL_COMMUNITY)
Admission: RE | Admit: 2011-11-23 | Discharge: 2011-11-23 | Disposition: A | Discharge status: Home / Self Care | Payer: 59 | Source: Ambulatory Visit | Attending: Cardiovascular Disease | Admitting: Cardiovascular Disease

## 2011-11-26 ENCOUNTER — Encounter (HOSPITAL_COMMUNITY)
Admission: RE | Admit: 2011-11-26 | Discharge: 2011-11-26 | Disposition: A | Discharge status: Home / Self Care | Payer: 59 | Source: Ambulatory Visit | Attending: Cardiovascular Disease | Admitting: Cardiovascular Disease

## 2011-11-28 ENCOUNTER — Encounter (HOSPITAL_COMMUNITY): Payer: 59

## 2011-11-30 ENCOUNTER — Encounter (HOSPITAL_COMMUNITY)
Admission: RE | Admit: 2011-11-30 | Discharge: 2011-11-30 | Disposition: A | Discharge status: Home / Self Care | Payer: 59 | Source: Ambulatory Visit | Attending: Cardiovascular Disease | Admitting: Cardiovascular Disease

## 2011-12-03 ENCOUNTER — Encounter (HOSPITAL_COMMUNITY)
Admission: RE | Admit: 2011-12-03 | Discharge: 2011-12-03 | Disposition: A | Discharge status: Home / Self Care | Payer: 59 | Source: Ambulatory Visit | Attending: Cardiovascular Disease | Admitting: Cardiovascular Disease

## 2011-12-05 ENCOUNTER — Encounter (HOSPITAL_COMMUNITY)
Admission: RE | Admit: 2011-12-05 | Discharge: 2011-12-05 | Disposition: A | Discharge status: Home / Self Care | Payer: 59 | Source: Ambulatory Visit | Attending: Cardiovascular Disease | Admitting: Cardiovascular Disease

## 2011-12-07 ENCOUNTER — Encounter (HOSPITAL_COMMUNITY)
Admission: RE | Admit: 2011-12-07 | Discharge: 2011-12-07 | Disposition: A | Discharge status: Home / Self Care | Payer: 59 | Source: Ambulatory Visit | Attending: Cardiovascular Disease | Admitting: Cardiovascular Disease

## 2011-12-10 ENCOUNTER — Encounter (HOSPITAL_COMMUNITY)
Admission: RE | Admit: 2011-12-10 | Discharge: 2011-12-10 | Disposition: A | Discharge status: Home / Self Care | Payer: 59 | Source: Ambulatory Visit | Attending: Cardiovascular Disease | Admitting: Cardiovascular Disease

## 2011-12-12 ENCOUNTER — Encounter (HOSPITAL_COMMUNITY): Payer: 59

## 2011-12-14 ENCOUNTER — Encounter (HOSPITAL_COMMUNITY): Payer: 59

## 2011-12-17 ENCOUNTER — Encounter (HOSPITAL_COMMUNITY)
Admission: RE | Admit: 2011-12-17 | Discharge: 2011-12-17 | Disposition: A | Discharge status: Home / Self Care | Payer: 59 | Source: Ambulatory Visit | Attending: Cardiovascular Disease | Admitting: Cardiovascular Disease

## 2011-12-19 ENCOUNTER — Encounter (HOSPITAL_COMMUNITY)
Admission: RE | Admit: 2011-12-19 | Discharge: 2011-12-19 | Disposition: A | Discharge status: Home / Self Care | Payer: 59 | Source: Ambulatory Visit | Attending: Cardiovascular Disease | Admitting: Cardiovascular Disease

## 2011-12-21 ENCOUNTER — Encounter (HOSPITAL_COMMUNITY)
Admission: RE | Admit: 2011-12-21 | Discharge: 2011-12-21 | Disposition: A | Discharge status: Home / Self Care | Payer: 59 | Source: Ambulatory Visit | Attending: Cardiovascular Disease | Admitting: Cardiovascular Disease

## 2011-12-21 DIAGNOSIS — I2510 Atherosclerotic heart disease of native coronary artery without angina pectoris: Secondary | ICD-10-CM | POA: Insufficient documentation

## 2011-12-21 DIAGNOSIS — K219 Gastro-esophageal reflux disease without esophagitis: Secondary | ICD-10-CM | POA: Insufficient documentation

## 2011-12-21 DIAGNOSIS — I4892 Unspecified atrial flutter: Secondary | ICD-10-CM | POA: Insufficient documentation

## 2011-12-21 DIAGNOSIS — I10 Essential (primary) hypertension: Secondary | ICD-10-CM | POA: Insufficient documentation

## 2011-12-21 DIAGNOSIS — Z5189 Encounter for other specified aftercare: Secondary | ICD-10-CM | POA: Insufficient documentation

## 2011-12-21 DIAGNOSIS — I4729 Other ventricular tachycardia: Secondary | ICD-10-CM | POA: Insufficient documentation

## 2011-12-21 DIAGNOSIS — I472 Ventricular tachycardia, unspecified: Secondary | ICD-10-CM | POA: Insufficient documentation

## 2011-12-21 DIAGNOSIS — E785 Hyperlipidemia, unspecified: Secondary | ICD-10-CM | POA: Insufficient documentation

## 2011-12-21 DIAGNOSIS — Z9861 Coronary angioplasty status: Secondary | ICD-10-CM | POA: Insufficient documentation

## 2011-12-24 ENCOUNTER — Encounter (HOSPITAL_COMMUNITY)
Admission: RE | Admit: 2011-12-24 | Discharge: 2011-12-24 | Disposition: A | Discharge status: Home / Self Care | Payer: 59 | Source: Ambulatory Visit | Attending: Cardiovascular Disease | Admitting: Cardiovascular Disease

## 2011-12-26 ENCOUNTER — Encounter (HOSPITAL_COMMUNITY)
Admission: RE | Admit: 2011-12-26 | Discharge: 2011-12-26 | Disposition: A | Discharge status: Home / Self Care | Payer: 59 | Source: Ambulatory Visit | Attending: Cardiovascular Disease | Admitting: Cardiovascular Disease

## 2011-12-28 ENCOUNTER — Encounter (HOSPITAL_COMMUNITY): Payer: 59

## 2011-12-31 ENCOUNTER — Encounter (HOSPITAL_COMMUNITY)
Admission: RE | Admit: 2011-12-31 | Discharge: 2011-12-31 | Disposition: A | Discharge status: Home / Self Care | Payer: 59 | Source: Ambulatory Visit | Attending: Cardiovascular Disease | Admitting: Cardiovascular Disease

## 2012-01-02 ENCOUNTER — Encounter (HOSPITAL_COMMUNITY)
Admission: RE | Admit: 2012-01-02 | Discharge: 2012-01-02 | Disposition: A | Discharge status: Home / Self Care | Payer: 59 | Source: Ambulatory Visit | Attending: Cardiovascular Disease | Admitting: Cardiovascular Disease

## 2012-01-04 ENCOUNTER — Encounter (HOSPITAL_COMMUNITY): Payer: 59

## 2012-01-07 ENCOUNTER — Encounter (HOSPITAL_COMMUNITY)
Admission: RE | Admit: 2012-01-07 | Discharge: 2012-01-07 | Disposition: A | Discharge status: Home / Self Care | Payer: 59 | Source: Ambulatory Visit | Attending: Cardiovascular Disease | Admitting: Cardiovascular Disease

## 2012-01-09 ENCOUNTER — Encounter (HOSPITAL_COMMUNITY)
Admission: RE | Admit: 2012-01-09 | Discharge: 2012-01-09 | Disposition: A | Discharge status: Home / Self Care | Payer: 59 | Source: Ambulatory Visit | Attending: Cardiovascular Disease | Admitting: Cardiovascular Disease

## 2012-01-11 ENCOUNTER — Encounter (HOSPITAL_COMMUNITY): Payer: 59

## 2012-01-14 ENCOUNTER — Encounter (HOSPITAL_COMMUNITY)
Admission: RE | Admit: 2012-01-14 | Discharge: 2012-01-14 | Disposition: A | Discharge status: Home / Self Care | Payer: 59 | Source: Ambulatory Visit | Attending: Cardiovascular Disease | Admitting: Cardiovascular Disease

## 2012-01-16 ENCOUNTER — Encounter (HOSPITAL_COMMUNITY): Payer: 59

## 2012-01-21 ENCOUNTER — Encounter (HOSPITAL_COMMUNITY)
Admission: RE | Admit: 2012-01-21 | Discharge: 2012-01-21 | Disposition: A | Discharge status: Home / Self Care | Payer: Self-pay | Source: Ambulatory Visit | Attending: Cardiovascular Disease | Admitting: Cardiovascular Disease

## 2012-01-21 DIAGNOSIS — I2510 Atherosclerotic heart disease of native coronary artery without angina pectoris: Secondary | ICD-10-CM | POA: Insufficient documentation

## 2012-01-21 DIAGNOSIS — I472 Ventricular tachycardia, unspecified: Secondary | ICD-10-CM | POA: Insufficient documentation

## 2012-01-21 DIAGNOSIS — K219 Gastro-esophageal reflux disease without esophagitis: Secondary | ICD-10-CM | POA: Insufficient documentation

## 2012-01-21 DIAGNOSIS — Z5189 Encounter for other specified aftercare: Secondary | ICD-10-CM | POA: Insufficient documentation

## 2012-01-21 DIAGNOSIS — I10 Essential (primary) hypertension: Secondary | ICD-10-CM | POA: Insufficient documentation

## 2012-01-21 DIAGNOSIS — I4892 Unspecified atrial flutter: Secondary | ICD-10-CM | POA: Insufficient documentation

## 2012-01-21 DIAGNOSIS — Z9861 Coronary angioplasty status: Secondary | ICD-10-CM | POA: Insufficient documentation

## 2012-01-21 DIAGNOSIS — E785 Hyperlipidemia, unspecified: Secondary | ICD-10-CM | POA: Insufficient documentation

## 2012-01-21 DIAGNOSIS — I4729 Other ventricular tachycardia: Secondary | ICD-10-CM | POA: Insufficient documentation

## 2012-01-23 ENCOUNTER — Encounter (HOSPITAL_COMMUNITY)
Admission: RE | Admit: 2012-01-23 | Discharge: 2012-01-23 | Disposition: A | Discharge status: Home / Self Care | Payer: Self-pay | Source: Ambulatory Visit | Attending: Cardiovascular Disease | Admitting: Cardiovascular Disease

## 2012-01-25 ENCOUNTER — Encounter (HOSPITAL_COMMUNITY)
Admission: RE | Admit: 2012-01-25 | Discharge: 2012-01-25 | Disposition: A | Discharge status: Home / Self Care | Payer: Self-pay | Source: Ambulatory Visit | Attending: Cardiovascular Disease | Admitting: Cardiovascular Disease

## 2012-01-28 ENCOUNTER — Encounter (HOSPITAL_COMMUNITY)
Admission: RE | Admit: 2012-01-28 | Discharge: 2012-01-28 | Disposition: A | Discharge status: Home / Self Care | Payer: Self-pay | Source: Ambulatory Visit | Attending: Cardiovascular Disease | Admitting: Cardiovascular Disease

## 2012-01-30 ENCOUNTER — Encounter (HOSPITAL_COMMUNITY)
Admission: RE | Admit: 2012-01-30 | Discharge: 2012-01-30 | Disposition: A | Discharge status: Home / Self Care | Payer: Self-pay | Source: Ambulatory Visit | Attending: Cardiovascular Disease | Admitting: Cardiovascular Disease

## 2012-02-01 ENCOUNTER — Encounter (HOSPITAL_COMMUNITY): Payer: Self-pay

## 2012-02-04 ENCOUNTER — Encounter (HOSPITAL_COMMUNITY)
Admission: RE | Admit: 2012-02-04 | Discharge: 2012-02-04 | Disposition: A | Discharge status: Home / Self Care | Payer: Self-pay | Source: Ambulatory Visit | Attending: Cardiovascular Disease | Admitting: Cardiovascular Disease

## 2012-02-06 ENCOUNTER — Encounter (HOSPITAL_COMMUNITY)
Admission: RE | Admit: 2012-02-06 | Discharge: 2012-02-06 | Disposition: A | Discharge status: Home / Self Care | Payer: Self-pay | Source: Ambulatory Visit | Attending: Cardiovascular Disease | Admitting: Cardiovascular Disease

## 2012-02-08 ENCOUNTER — Encounter (HOSPITAL_COMMUNITY): Payer: Self-pay

## 2012-02-11 ENCOUNTER — Encounter (HOSPITAL_COMMUNITY): Payer: Self-pay

## 2012-02-15 ENCOUNTER — Encounter (HOSPITAL_COMMUNITY)
Admission: RE | Admit: 2012-02-15 | Discharge: 2012-02-15 | Disposition: A | Discharge status: Home / Self Care | Payer: Self-pay | Source: Ambulatory Visit | Attending: Cardiovascular Disease | Admitting: Cardiovascular Disease

## 2012-02-18 ENCOUNTER — Encounter (HOSPITAL_COMMUNITY)
Admission: RE | Admit: 2012-02-18 | Discharge: 2012-02-18 | Disposition: A | Discharge status: Home / Self Care | Payer: Self-pay | Source: Ambulatory Visit | Attending: Cardiovascular Disease | Admitting: Cardiovascular Disease

## 2012-02-22 ENCOUNTER — Encounter (HOSPITAL_COMMUNITY): Payer: Self-pay

## 2012-02-22 DIAGNOSIS — K219 Gastro-esophageal reflux disease without esophagitis: Secondary | ICD-10-CM | POA: Insufficient documentation

## 2012-02-22 DIAGNOSIS — I472 Ventricular tachycardia, unspecified: Secondary | ICD-10-CM | POA: Insufficient documentation

## 2012-02-22 DIAGNOSIS — I4729 Other ventricular tachycardia: Secondary | ICD-10-CM | POA: Insufficient documentation

## 2012-02-22 DIAGNOSIS — I4892 Unspecified atrial flutter: Secondary | ICD-10-CM | POA: Insufficient documentation

## 2012-02-22 DIAGNOSIS — Z5189 Encounter for other specified aftercare: Secondary | ICD-10-CM | POA: Insufficient documentation

## 2012-02-22 DIAGNOSIS — I2510 Atherosclerotic heart disease of native coronary artery without angina pectoris: Secondary | ICD-10-CM | POA: Insufficient documentation

## 2012-02-22 DIAGNOSIS — E785 Hyperlipidemia, unspecified: Secondary | ICD-10-CM | POA: Insufficient documentation

## 2012-02-22 DIAGNOSIS — I10 Essential (primary) hypertension: Secondary | ICD-10-CM | POA: Insufficient documentation

## 2012-02-22 DIAGNOSIS — Z9861 Coronary angioplasty status: Secondary | ICD-10-CM | POA: Insufficient documentation

## 2012-02-25 ENCOUNTER — Encounter (HOSPITAL_COMMUNITY)
Admission: RE | Admit: 2012-02-25 | Discharge: 2012-02-25 | Disposition: A | Discharge status: Home / Self Care | Payer: Self-pay | Source: Ambulatory Visit | Attending: Cardiovascular Disease | Admitting: Cardiovascular Disease

## 2012-02-25 ENCOUNTER — Ambulatory Visit (HOSPITAL_COMMUNITY)
Admission: RE | Admit: 2012-02-25 | Discharge: 2012-02-25 | Disposition: A | Discharge status: Home / Self Care | Payer: 59 | Source: Ambulatory Visit | Attending: Cardiovascular Disease | Admitting: Cardiovascular Disease

## 2012-02-25 DIAGNOSIS — Z79899 Other long term (current) drug therapy: Secondary | ICD-10-CM | POA: Insufficient documentation

## 2012-02-25 LAB — PLATELET INHIBITION P2Y12: Platelet Function  P2Y12: 89 [PRU] — ABNORMAL LOW (ref 194–418)

## 2012-02-27 ENCOUNTER — Encounter (HOSPITAL_COMMUNITY)
Admission: RE | Admit: 2012-02-27 | Discharge: 2012-02-27 | Disposition: A | Discharge status: Home / Self Care | Payer: Self-pay | Source: Ambulatory Visit | Attending: Cardiovascular Disease | Admitting: Cardiovascular Disease

## 2012-02-29 ENCOUNTER — Encounter (HOSPITAL_COMMUNITY)
Admission: RE | Admit: 2012-02-29 | Discharge: 2012-02-29 | Disposition: A | Discharge status: Home / Self Care | Payer: Self-pay | Source: Ambulatory Visit | Attending: Cardiovascular Disease | Admitting: Cardiovascular Disease

## 2012-03-03 ENCOUNTER — Encounter (HOSPITAL_COMMUNITY): Payer: Self-pay

## 2012-03-05 ENCOUNTER — Encounter (HOSPITAL_COMMUNITY)
Admission: RE | Admit: 2012-03-05 | Discharge: 2012-03-05 | Disposition: A | Discharge status: Home / Self Care | Payer: Self-pay | Source: Ambulatory Visit | Attending: Cardiovascular Disease | Admitting: Cardiovascular Disease

## 2012-03-07 ENCOUNTER — Encounter (HOSPITAL_COMMUNITY)
Admission: RE | Admit: 2012-03-07 | Discharge: 2012-03-07 | Disposition: A | Discharge status: Home / Self Care | Payer: Self-pay | Source: Ambulatory Visit | Attending: Cardiovascular Disease | Admitting: Cardiovascular Disease

## 2012-03-10 ENCOUNTER — Encounter (HOSPITAL_COMMUNITY): Payer: Self-pay

## 2012-03-12 ENCOUNTER — Encounter (HOSPITAL_COMMUNITY)
Admission: RE | Admit: 2012-03-12 | Discharge: 2012-03-12 | Disposition: A | Discharge status: Home / Self Care | Payer: Self-pay | Source: Ambulatory Visit | Attending: Cardiovascular Disease | Admitting: Cardiovascular Disease

## 2012-03-14 ENCOUNTER — Encounter (HOSPITAL_COMMUNITY)
Admission: RE | Admit: 2012-03-14 | Discharge: 2012-03-14 | Disposition: A | Discharge status: Home / Self Care | Payer: Self-pay | Source: Ambulatory Visit | Attending: Cardiovascular Disease | Admitting: Cardiovascular Disease

## 2012-03-17 ENCOUNTER — Encounter (HOSPITAL_COMMUNITY)
Admission: RE | Admit: 2012-03-17 | Discharge: 2012-03-17 | Disposition: A | Discharge status: Home / Self Care | Payer: Self-pay | Source: Ambulatory Visit | Attending: Cardiovascular Disease | Admitting: Cardiovascular Disease

## 2012-03-18 ENCOUNTER — Other Ambulatory Visit (HOSPITAL_COMMUNITY): Payer: 59

## 2012-03-19 ENCOUNTER — Encounter (HOSPITAL_COMMUNITY)
Admission: RE | Admit: 2012-03-19 | Discharge: 2012-03-19 | Disposition: A | Discharge status: Home / Self Care | Payer: Self-pay | Source: Ambulatory Visit | Attending: Cardiovascular Disease | Admitting: Cardiovascular Disease

## 2012-03-20 ENCOUNTER — Ambulatory Visit: Admit: 2012-03-20 | Payer: Self-pay | Admitting: Orthopedic Surgery

## 2012-03-20 SURGERY — ARTHROSCOPY, KNEE, WITH LATERAL MENISCECTOMY
Anesthesia: Choice | Laterality: Right

## 2012-03-21 ENCOUNTER — Encounter (HOSPITAL_COMMUNITY): Admission: RE | Admit: 2012-03-21 | Payer: Self-pay | Source: Ambulatory Visit

## 2012-03-24 ENCOUNTER — Encounter (HOSPITAL_COMMUNITY)
Admission: RE | Admit: 2012-03-24 | Discharge: 2012-03-24 | Disposition: A | Discharge status: Home / Self Care | Payer: Self-pay | Source: Ambulatory Visit | Attending: Cardiovascular Disease | Admitting: Cardiovascular Disease

## 2012-03-24 DIAGNOSIS — I4729 Other ventricular tachycardia: Secondary | ICD-10-CM | POA: Insufficient documentation

## 2012-03-24 DIAGNOSIS — I10 Essential (primary) hypertension: Secondary | ICD-10-CM | POA: Insufficient documentation

## 2012-03-24 DIAGNOSIS — Z9861 Coronary angioplasty status: Secondary | ICD-10-CM | POA: Insufficient documentation

## 2012-03-24 DIAGNOSIS — K219 Gastro-esophageal reflux disease without esophagitis: Secondary | ICD-10-CM | POA: Insufficient documentation

## 2012-03-24 DIAGNOSIS — I4892 Unspecified atrial flutter: Secondary | ICD-10-CM | POA: Insufficient documentation

## 2012-03-24 DIAGNOSIS — I472 Ventricular tachycardia, unspecified: Secondary | ICD-10-CM | POA: Insufficient documentation

## 2012-03-24 DIAGNOSIS — Z5189 Encounter for other specified aftercare: Secondary | ICD-10-CM | POA: Insufficient documentation

## 2012-03-24 DIAGNOSIS — I2510 Atherosclerotic heart disease of native coronary artery without angina pectoris: Secondary | ICD-10-CM | POA: Insufficient documentation

## 2012-03-24 DIAGNOSIS — E785 Hyperlipidemia, unspecified: Secondary | ICD-10-CM | POA: Insufficient documentation

## 2012-03-26 ENCOUNTER — Encounter (HOSPITAL_COMMUNITY)
Admission: RE | Admit: 2012-03-26 | Discharge: 2012-03-26 | Disposition: A | Discharge status: Home / Self Care | Payer: Self-pay | Source: Ambulatory Visit | Attending: Cardiovascular Disease | Admitting: Cardiovascular Disease

## 2012-03-28 ENCOUNTER — Encounter (HOSPITAL_COMMUNITY): Payer: Self-pay

## 2012-03-31 ENCOUNTER — Encounter (HOSPITAL_COMMUNITY)
Admission: RE | Admit: 2012-03-31 | Discharge: 2012-03-31 | Disposition: A | Discharge status: Home / Self Care | Payer: Self-pay | Source: Ambulatory Visit | Attending: Cardiovascular Disease | Admitting: Cardiovascular Disease

## 2012-04-02 ENCOUNTER — Encounter (HOSPITAL_COMMUNITY)
Admission: RE | Admit: 2012-04-02 | Discharge: 2012-04-02 | Disposition: A | Discharge status: Home / Self Care | Payer: Self-pay | Source: Ambulatory Visit | Attending: Cardiovascular Disease | Admitting: Cardiovascular Disease

## 2012-04-04 ENCOUNTER — Encounter (HOSPITAL_COMMUNITY): Payer: Self-pay

## 2012-04-07 ENCOUNTER — Encounter (HOSPITAL_COMMUNITY)
Admission: RE | Admit: 2012-04-07 | Discharge: 2012-04-07 | Disposition: A | Discharge status: Home / Self Care | Payer: Self-pay | Source: Ambulatory Visit | Attending: Cardiovascular Disease | Admitting: Cardiovascular Disease

## 2012-04-09 ENCOUNTER — Encounter (HOSPITAL_COMMUNITY): Payer: Self-pay

## 2012-04-09 ENCOUNTER — Encounter (HOSPITAL_COMMUNITY): Payer: Self-pay | Admitting: Pharmacy Technician

## 2012-04-10 ENCOUNTER — Encounter: Payer: Self-pay | Admitting: Physician Assistant

## 2012-04-10 ENCOUNTER — Other Ambulatory Visit: Payer: Self-pay | Admitting: Physician Assistant

## 2012-04-10 DIAGNOSIS — E782 Mixed hyperlipidemia: Secondary | ICD-10-CM | POA: Insufficient documentation

## 2012-04-10 DIAGNOSIS — K5790 Diverticulosis of intestine, part unspecified, without perforation or abscess without bleeding: Secondary | ICD-10-CM

## 2012-04-10 DIAGNOSIS — I10 Essential (primary) hypertension: Secondary | ICD-10-CM

## 2012-04-10 DIAGNOSIS — F419 Anxiety disorder, unspecified: Secondary | ICD-10-CM

## 2012-04-10 DIAGNOSIS — E785 Hyperlipidemia, unspecified: Secondary | ICD-10-CM

## 2012-04-10 DIAGNOSIS — K5792 Diverticulitis of intestine, part unspecified, without perforation or abscess without bleeding: Secondary | ICD-10-CM

## 2012-04-10 DIAGNOSIS — S83249A Other tear of medial meniscus, current injury, unspecified knee, initial encounter: Secondary | ICD-10-CM

## 2012-04-10 DIAGNOSIS — K219 Gastro-esophageal reflux disease without esophagitis: Secondary | ICD-10-CM | POA: Insufficient documentation

## 2012-04-10 NOTE — H&P (Signed)
Marvin Carter is an 57 y.o. male.   Chief Complaint: right knee medial meniscus tear.  DJD HPI: Marvin Carter is seen for evaluation follow up for significant persistent right knee pain with locking and catching in his right knee that occurs 5-6 times a week and then it goes away.  He feels like something is floating around in the knee and his knee is out of place.  He underwent an MRI of his right knee on February 11, 2012 that revealed an extruded medial meniscus fragment with a horizontal tear, as well as two centimeter loose bodies posterior to the PCL, as well as chondromalacia.  We had operated on his knee 14 months ago and he went into V-tach during surgery.  Had immediate stent placement by Dr. Jonathan Berry.  He is now only on Plavix and Dr. Berry has cleared him for any surgery that would be indicated.  He continues to have significant pain in his knee on a daily basis.  He is on Plavix for this and Aspirin as well.  Past Medical History  Diagnosis Date  . Hypertension   . Hyperlipidemia   . Diverticulitis   . Hemorrhoids   . GERD (gastroesophageal reflux disease)   . Anxiety   . Meniscus tear 01/02/2011  . Right knee pain 01/02/2011  . Diverticulosis   . Hemarthrosis of knee, right 01/04/2011  . NSVT (nonsustained ventricular tachycardia) 01/02/2011  . CAD (coronary artery disease), native coronary artery 01/04/2011  . Stented coronary artery 01/04/2011  . Hyperlipemia 01/04/2011  . Atrial flutter, paroxysmal 01/04/2011  . History of meniscectomy of right knee 01/02/2011  . Torn medial meniscus     right knee    Past Surgical History  Procedure Laterality Date  . Appendectomy    . Knee arthroscopy      x 8 left  . Rotator cuff repair      bilateral  . Hand surgery      right  . Bicept tendon repair      bilateral  . Tonsillectomy    . Cholecystectomy      Family History  Problem Relation Age of Onset  . Diabetes Mother   . Heart disease Mother   . Heart attack  Mother   . Heart disease Father   . Coronary artery disease Father    Social History:  reports that he has never smoked. He has never used smokeless tobacco. He reports that  drinks alcohol. He reports that he does not use illicit drugs.  Allergies: No Known Allergies   (Not in a hospital admission)  No results found for this or any previous visit (from the past 48 hour(s)). No results found.  Review of Systems  Constitutional: Negative.   HENT: Negative.   Eyes: Negative.   Respiratory: Negative for cough, hemoptysis, sputum production and wheezing.   Cardiovascular: Negative.  Negative for chest pain, palpitations, orthopnea, claudication, leg swelling and PND.  Gastrointestinal: Negative.   Genitourinary: Negative.   Musculoskeletal: Positive for joint pain.       Right knee pain  Skin: Negative.   Neurological: Negative.   Endo/Heme/Allergies: Negative.   Psychiatric/Behavioral: Negative.     Blood pressure 132/86, pulse 53, temperature 97.6 F (36.4 C), height 5' 7" (1.702 m), weight 91.627 kg (202 lb), SpO2 97.00%. Physical Exam  Constitutional: He is oriented to person, place, and time. He appears well-developed and well-nourished.  HENT:  Head: Normocephalic and atraumatic.  Eyes: Conjunctivae are normal. Pupils are equal, round,   and reactive to light.  Neck: Neck supple.  Cardiovascular: Normal rate and regular rhythm.   Respiratory: Effort normal and breath sounds normal.  GI: Soft.  Genitourinary:  Not pertinent to current symptomatology therefore not examined.  Musculoskeletal:  Examination of his right knee reveals pain on the medial joint line and posteriorly.  1+ effusion.  Range of motion 0-120 degrees with catching and locking and positive medial McMurray's.  Knee stable to ligamentous exam with normal patella tracking.  Examination of his left knee reveals 1-2+ crepitation.  1+ synovitis.  Range of motion 0-120 degrees.  Knee is stable with moderate varus  deformity.  Normal patella tracking.  Vascular exam: Pulses are 2+ and symmetric.    Neurological: He is alert and oriented to person, place, and time.  Skin: Skin is warm and dry.  Psychiatric: He has a normal mood and affect. His behavior is normal. Thought content normal.     Assessment Patient Active Problem List  Diagnosis  . Prolapsed external hemorrhoids  . Hemorrhoids, internal, with bleeding  . Meniscus tear  . Right knee pain  . NSVT (nonsustained ventricular tachycardia)  . Atrial flutter, paroxysmal  . CAD (coronary artery disease), native coronary artery  . Stented coronary artery  . Hyperlipemia  . S/P right knee arthroscopy  . History of meniscectomy of right knee  . Hemarthrosis of knee, right  . Torn medial meniscus  . Hypertension  . Hyperlipidemia  . Diverticulitis  . GERD (gastroesophageal reflux disease)  . Anxiety  . Diverticulosis    Plan I have talked to Eustacio about this in detail.  He does have significant chondromalacia in his knee on MRI, but all of his symptoms are mechanical in nature.  I do think at this point with his significant disability from this I would recommend that we proceed with arthroscopy with attention to his meniscal pathology, loose bodies and chondral pathology.  I have spoken to Dr. Jonathan Berry about this who has recommended that this procedure be done in the main hospital for intraoperative cardiac monitoring, but if he has no problems postoperatively he does not have to spend the night.  He will need to come off of his Plavix one week pre-op.  Risks, complications and benefits of the surgery have been described to him in detail and he understands this completely.  We will plan on setting him up for this when he is ready to proceed.   Nevia Henkin J 04/10/2012, 5:00 PM    

## 2012-04-11 ENCOUNTER — Encounter (HOSPITAL_COMMUNITY)
Admission: RE | Admit: 2012-04-11 | Discharge: 2012-04-11 | Disposition: A | Discharge status: Home / Self Care | Payer: Self-pay | Source: Ambulatory Visit | Attending: Cardiovascular Disease | Admitting: Cardiovascular Disease

## 2012-04-14 ENCOUNTER — Encounter (HOSPITAL_COMMUNITY)
Admission: RE | Admit: 2012-04-14 | Discharge: 2012-04-14 | Disposition: A | Discharge status: Home / Self Care | Payer: Self-pay | Source: Ambulatory Visit | Attending: Cardiovascular Disease | Admitting: Cardiovascular Disease

## 2012-04-14 NOTE — Pre-Procedure Instructions (Addendum)
Marvin Carter  04/14/2012   Your procedure is scheduled on:  04/21/12  Report to [facility] at 1030 AM.  Call this number if you have problems the morning of surgery: [phone]   Remember:   Do not eat food or drink liquids after midnight.   Take these medicines the morning of surgery with A SIP OF WATER:,atenolol, wellbutrin, diltiazem,  STOP fish oil , plavix per dr   Drucilla Schmidt not wear jewelry, make-up or nail polish.  Do not wear lotions, powders, or perfumes. You may wear deodorant.  Do not shave 48 hours prior to surgery. Men may shave face and neck.  Do not bring valuables to the hospital.  Contacts, dentures or bridgework may not be worn into surgery.  Leave suitcase in the car. After surgery it may be brought to your room.  For patients admitted to the hospital, checkout time is 11:00 AM the day of  discharge.   Patients discharged the day of surgery will not be allowed to drive  home.  Name and phone number of your driver:  Special Instructions: Incentive Spirometry - Practice and bring it with you on the day of surgery. Shower using CHG 2 nights before surgery and the night before surgery.  If you shower the day of surgery use CHG.  Use special wash - you have one bottle of CHG for all showers.  You should use approximately 1/3 of the bottle for each shower.   Please read over the following fact sheets that you were given: Pain Booklet, Coughing and Deep Breathing, Blood Transfusion Information, MRSA Information and Surgical Site Infection Prevention

## 2012-04-15 ENCOUNTER — Encounter (HOSPITAL_COMMUNITY)
Admission: RE | Admit: 2012-04-15 | Discharge: 2012-04-15 | Disposition: A | Discharge status: Home / Self Care | Payer: 59 | Source: Ambulatory Visit | Attending: Orthopedic Surgery | Admitting: Orthopedic Surgery

## 2012-04-15 ENCOUNTER — Encounter (HOSPITAL_COMMUNITY)
Admission: RE | Admit: 2012-04-15 | Discharge: 2012-04-15 | Disposition: A | Discharge status: Home / Self Care | Payer: 59 | Source: Ambulatory Visit | Attending: Physician Assistant | Admitting: Physician Assistant

## 2012-04-15 ENCOUNTER — Encounter (HOSPITAL_COMMUNITY): Payer: Self-pay

## 2012-04-15 HISTORY — DX: Unspecified osteoarthritis, unspecified site: M19.90

## 2012-04-15 LAB — CBC WITH DIFFERENTIAL/PLATELET
Basophils Absolute: 0 10*3/uL (ref 0.0–0.1)
Lymphocytes Relative: 29 % (ref 12–46)
Neutro Abs: 4 10*3/uL (ref 1.7–7.7)
Platelets: 197 10*3/uL (ref 150–400)
RDW: 12.6 % (ref 11.5–15.5)
WBC: 7.1 10*3/uL (ref 4.0–10.5)

## 2012-04-15 LAB — SURGICAL PCR SCREEN: Staphylococcus aureus: NEGATIVE

## 2012-04-15 LAB — TYPE AND SCREEN

## 2012-04-15 LAB — COMPREHENSIVE METABOLIC PANEL
ALT: 25 U/L (ref 0–53)
AST: 35 U/L (ref 0–37)
CO2: 27 mEq/L (ref 19–32)
Calcium: 10.3 mg/dL (ref 8.4–10.5)
Chloride: 99 mEq/L (ref 96–112)
GFR calc non Af Amer: 58 mL/min — ABNORMAL LOW (ref >90)
Sodium: 138 mEq/L (ref 135–145)
Total Bilirubin: 0.5 mg/dL (ref 0.3–1.2)

## 2012-04-15 NOTE — Progress Notes (Addendum)
Requested notes from 12/13 visit with dr berry clearance.patient went into vtach during surgery at day surgery in nov 12 Admitted and stent placed.

## 2012-04-16 ENCOUNTER — Encounter (HOSPITAL_COMMUNITY): Admission: RE | Admit: 2012-04-16 | Payer: Self-pay | Source: Ambulatory Visit

## 2012-04-16 NOTE — Progress Notes (Signed)
Anesthesia Chart Review:  Patient is a 57 year old male scheduled for right knee arthroscopy for a medial mensicus tear by Dr. Thurston Hole on 04/21/12.  In November 2012, he was undergoing a right knee arthroscopy at the [facility] and during the procedure was noted to have a 30 second run of VT.  He was transferred to [facility] where he underwent a cardiac cath and ultimately a LAD DES.  Other history includes CAD, NSVT 12/2010, HLD, HTN, GERD, OA, anxiety.  His current Cardiologist is Dr. Allyson Sabal [facility]).  Dr. Allyson Sabal is currently out of town until 04/21/12, but I confirmed with Silvestre Mesi at Dr. Sherene Sires office that he did notify Dr. Allyson Sabal of planned procedure and was given an okay to proceed and temporarily hold Plavix.  (There is a note written by Dr. Thurston Hole that outlines his conversation with Dr. Allyson Sabal that is scanned into their computer system that Roanna Raider will attempt to fax to Short Stay if it can be configured to fax.)  His last office visit was on 12/27/11.  EKG then showed SB @ 52 bpm.  Six month follow-up was recommended.  EKG on 04/15/12 showed NSR, non-specific ST/T wave abnormality (primarily inferior leads).  Cardiac cath on 01/02/11 showed normal left main, LAD with 70% stenosis in the midportion of takeoff of the small diagonal branch, LCX and RCA free of significant disease, LVEF > 60% without focal wall motion abnormalities.  S/P successful mid LAD PTCA and Resolute DES.  His last stress test was in 2009.  Preoperative CXR and labs noted.  If no acute CV symptoms then would anticipate he could proceed as planned.  Shonna Chock, PA-C 04/16/12 1148

## 2012-04-18 ENCOUNTER — Encounter (HOSPITAL_COMMUNITY)
Admission: RE | Admit: 2012-04-18 | Discharge: 2012-04-18 | Disposition: A | Discharge status: Home / Self Care | Payer: Self-pay | Source: Ambulatory Visit | Attending: Cardiovascular Disease | Admitting: Cardiovascular Disease

## 2012-04-21 ENCOUNTER — Encounter (HOSPITAL_COMMUNITY): Payer: Self-pay

## 2012-04-21 ENCOUNTER — Ambulatory Visit (HOSPITAL_COMMUNITY)
Admission: RE | Admit: 2012-04-21 | Discharge: 2012-04-21 | Disposition: A | Discharge status: Home / Self Care | Payer: 59 | Source: Ambulatory Visit | Attending: Orthopedic Surgery | Admitting: Orthopedic Surgery

## 2012-04-21 ENCOUNTER — Ambulatory Visit (HOSPITAL_COMMUNITY): Payer: 59 | Admitting: Certified Registered"

## 2012-04-21 ENCOUNTER — Encounter (HOSPITAL_COMMUNITY): Payer: Self-pay | Admitting: Vascular Surgery

## 2012-04-21 ENCOUNTER — Encounter (HOSPITAL_COMMUNITY)
Admission: RE | Disposition: A | Discharge status: Home / Self Care | Payer: Self-pay | Source: Ambulatory Visit | Attending: Orthopedic Surgery

## 2012-04-21 DIAGNOSIS — Z9861 Coronary angioplasty status: Secondary | ICD-10-CM | POA: Insufficient documentation

## 2012-04-21 DIAGNOSIS — I472 Ventricular tachycardia, unspecified: Secondary | ICD-10-CM | POA: Insufficient documentation

## 2012-04-21 DIAGNOSIS — I10 Essential (primary) hypertension: Secondary | ICD-10-CM | POA: Insufficient documentation

## 2012-04-21 DIAGNOSIS — I4892 Unspecified atrial flutter: Secondary | ICD-10-CM | POA: Insufficient documentation

## 2012-04-21 DIAGNOSIS — K219 Gastro-esophageal reflux disease without esophagitis: Secondary | ICD-10-CM | POA: Insufficient documentation

## 2012-04-21 DIAGNOSIS — S83289A Other tear of lateral meniscus, current injury, unspecified knee, initial encounter: Secondary | ICD-10-CM | POA: Insufficient documentation

## 2012-04-21 DIAGNOSIS — E785 Hyperlipidemia, unspecified: Secondary | ICD-10-CM | POA: Insufficient documentation

## 2012-04-21 DIAGNOSIS — M1990 Unspecified osteoarthritis, unspecified site: Secondary | ICD-10-CM | POA: Insufficient documentation

## 2012-04-21 DIAGNOSIS — X58XXXA Exposure to other specified factors, initial encounter: Secondary | ICD-10-CM | POA: Insufficient documentation

## 2012-04-21 DIAGNOSIS — Z5189 Encounter for other specified aftercare: Secondary | ICD-10-CM | POA: Insufficient documentation

## 2012-04-21 DIAGNOSIS — S83206D Unspecified tear of unspecified meniscus, current injury, right knee, subsequent encounter: Secondary | ICD-10-CM

## 2012-04-21 DIAGNOSIS — M2340 Loose body in knee, unspecified knee: Secondary | ICD-10-CM | POA: Insufficient documentation

## 2012-04-21 DIAGNOSIS — M2240 Chondromalacia patellae, unspecified knee: Secondary | ICD-10-CM | POA: Insufficient documentation

## 2012-04-21 DIAGNOSIS — I739 Peripheral vascular disease, unspecified: Secondary | ICD-10-CM | POA: Insufficient documentation

## 2012-04-21 DIAGNOSIS — I4891 Unspecified atrial fibrillation: Secondary | ICD-10-CM | POA: Insufficient documentation

## 2012-04-21 DIAGNOSIS — Z7982 Long term (current) use of aspirin: Secondary | ICD-10-CM | POA: Insufficient documentation

## 2012-04-21 DIAGNOSIS — Z7902 Long term (current) use of antithrombotics/antiplatelets: Secondary | ICD-10-CM | POA: Insufficient documentation

## 2012-04-21 DIAGNOSIS — I2510 Atherosclerotic heart disease of native coronary artery without angina pectoris: Secondary | ICD-10-CM | POA: Insufficient documentation

## 2012-04-21 DIAGNOSIS — F411 Generalized anxiety disorder: Secondary | ICD-10-CM | POA: Insufficient documentation

## 2012-04-21 DIAGNOSIS — IMO0002 Reserved for concepts with insufficient information to code with codable children: Secondary | ICD-10-CM | POA: Insufficient documentation

## 2012-04-21 DIAGNOSIS — I4729 Other ventricular tachycardia: Secondary | ICD-10-CM | POA: Insufficient documentation

## 2012-04-21 HISTORY — PX: KNEE ARTHROSCOPY: SHX127

## 2012-04-21 SURGERY — ARTHROSCOPY, KNEE
Anesthesia: Choice | Site: Knee | Laterality: Right | Wound class: Clean

## 2012-04-21 MED ORDER — ONDANSETRON HCL 4 MG/2ML IJ SOLN
INTRAMUSCULAR | Status: DC | PRN
  Administered 2012-04-21: 4 mg via INTRAVENOUS

## 2012-04-21 MED ORDER — CHLORHEXIDINE GLUCONATE 4 % EX LIQD: 60.0000 mL | Freq: Once | CUTANEOUS | Status: DC

## 2012-04-21 MED ORDER — MEPERIDINE HCL 25 MG/ML IJ SOLN: 6.2500 mg | INTRAMUSCULAR | Status: DC | PRN

## 2012-04-21 MED ORDER — CEFAZOLIN SODIUM-DEXTROSE 2-3 GM-% IV SOLR
INTRAVENOUS | Status: DC | PRN
  Administered 2012-04-21: 2 g via INTRAVENOUS

## 2012-04-21 MED ORDER — OXYCODONE HCL 5 MG/5ML PO SOLN
5.0000 mg | Freq: Once | ORAL | Status: DC | PRN
Start: 2012-04-21 — End: 2012-04-21

## 2012-04-21 MED ORDER — POVIDONE-IODINE 7.5 % EX SOLN
Freq: Once | CUTANEOUS | Status: DC
  Filled 2012-04-21: qty 118

## 2012-04-21 MED ORDER — FENTANYL CITRATE 0.05 MG/ML IJ SOLN
INTRAMUSCULAR | Status: AC
  Filled 2012-04-21: qty 2

## 2012-04-21 MED ORDER — PHENYLEPHRINE HCL 10 MG/ML IJ SOLN
INTRAMUSCULAR | Status: DC | PRN
  Administered 2012-04-21: 40 ug via INTRAVENOUS

## 2012-04-21 MED ORDER — MIDAZOLAM HCL 2 MG/2ML IJ SOLN
1.0000 mg | INTRAMUSCULAR | Status: DC | PRN
  Administered 2012-04-21: 2 mg via INTRAVENOUS

## 2012-04-21 MED ORDER — MIDAZOLAM HCL 5 MG/5ML IJ SOLN
INTRAMUSCULAR | Status: DC | PRN
  Administered 2012-04-21: 2 mg via INTRAVENOUS

## 2012-04-21 MED ORDER — ONDANSETRON HCL 4 MG/2ML IJ SOLN: 4.0000 mg | Freq: Once | INTRAMUSCULAR | Status: DC | PRN

## 2012-04-21 MED ORDER — OXYCODONE HCL 5 MG PO TABS: ORAL_TABLET | ORAL | Status: DC

## 2012-04-21 MED ORDER — LACTATED RINGERS IV SOLN
INTRAVENOUS | Status: DC
  Administered 2012-04-21: 11:00:00 via INTRAVENOUS

## 2012-04-21 MED ORDER — OXYCODONE HCL 5 MG PO TABS: 5.0000 mg | ORAL_TABLET | Freq: Once | ORAL | Status: DC | PRN

## 2012-04-21 MED ORDER — PROPOFOL 10 MG/ML IV BOLUS
INTRAVENOUS | Status: DC | PRN
  Administered 2012-04-21: 200 mg via INTRAVENOUS
  Administered 2012-04-21: 50 mg via INTRAVENOUS

## 2012-04-21 MED ORDER — EPHEDRINE SULFATE 50 MG/ML IJ SOLN
INTRAMUSCULAR | Status: DC | PRN
  Administered 2012-04-21: 5 mg via INTRAVENOUS

## 2012-04-21 MED ORDER — SODIUM CHLORIDE 0.9 % IR SOLN
Status: DC | PRN
  Administered 2012-04-21: 3000 mL

## 2012-04-21 MED ORDER — HYDROMORPHONE HCL PF 1 MG/ML IJ SOLN: 0.2500 mg | INTRAMUSCULAR | Status: DC | PRN

## 2012-04-21 MED ORDER — MIDAZOLAM HCL 2 MG/2ML IJ SOLN
INTRAMUSCULAR | Status: AC
  Filled 2012-04-21: qty 2

## 2012-04-21 MED ORDER — FENTANYL CITRATE 0.05 MG/ML IJ SOLN
INTRAMUSCULAR | Status: DC | PRN
  Administered 2012-04-21 (×2): 50 ug via INTRAVENOUS

## 2012-04-21 MED ORDER — LIDOCAINE HCL (CARDIAC) 20 MG/ML IV SOLN
INTRAVENOUS | Status: DC | PRN
  Administered 2012-04-21: 50 mg via INTRAVENOUS

## 2012-04-21 MED ORDER — FENTANYL CITRATE 0.05 MG/ML IJ SOLN
50.0000 ug | INTRAMUSCULAR | Status: DC | PRN
  Administered 2012-04-21: 100 ug via INTRAVENOUS

## 2012-04-21 MED ORDER — LACTATED RINGERS IV SOLN
INTRAVENOUS | Status: DC | PRN
  Administered 2012-04-21: 12:00:00 via INTRAVENOUS

## 2012-04-21 SURGICAL SUPPLY — 48 items
BANDAGE ELASTIC 6 VELCRO ST LF (GAUZE/BANDAGES/DRESSINGS) ×2 IMPLANT
BANDAGE ESMARK 6X9 LF (GAUZE/BANDAGES/DRESSINGS) IMPLANT
BANDAGE GAUZE ELAST BULKY 4 IN (GAUZE/BANDAGES/DRESSINGS) ×2 IMPLANT
BLADE CUDA 5.5 (BLADE) IMPLANT
BLADE CUTTER GATOR 3.5 (BLADE) IMPLANT
BLADE GREAT WHITE 4.2 (BLADE) ×2 IMPLANT
BNDG CMPR 9X6 STRL LF SNTH (GAUZE/BANDAGES/DRESSINGS)
BNDG ESMARK 6X9 LF (GAUZE/BANDAGES/DRESSINGS)
BUR OVAL 6.0 (BURR) IMPLANT
CLOTH BEACON ORANGE TIMEOUT ST (SAFETY) ×4 IMPLANT
CUFF TOURNIQUET SINGLE 34IN LL (TOURNIQUET CUFF) IMPLANT
CUFF TOURNIQUET SINGLE 44IN (TOURNIQUET CUFF) IMPLANT
DRAPE ARTHROSCOPY W/POUCH 114 (DRAPES) ×2 IMPLANT
DRAPE U-SHAPE 47X51 STRL (DRAPES) ×2 IMPLANT
DURAPREP 26ML APPLICATOR (WOUND CARE) ×2 IMPLANT
ELECT CAUTERY BLADE 6.4 (BLADE) IMPLANT
ELECT MENISCUS 165MM 90D (ELECTRODE) IMPLANT
ELECT REM PT RETURN 9FT ADLT (ELECTROSURGICAL)
ELECTRODE REM PT RTRN 9FT ADLT (ELECTROSURGICAL) IMPLANT
FILTER STRAW FLUID ASPIR (MISCELLANEOUS) ×2 IMPLANT
GAUZE XEROFORM 1X8 LF (GAUZE/BANDAGES/DRESSINGS) ×2 IMPLANT
GLOVE BIO SURGEON STRL SZ7 (GLOVE) ×2 IMPLANT
GLOVE BIOGEL PI IND STRL 7.0 (GLOVE) ×1 IMPLANT
GLOVE BIOGEL PI IND STRL 7.5 (GLOVE) ×1 IMPLANT
GLOVE BIOGEL PI INDICATOR 7.0 (GLOVE) ×1
GLOVE BIOGEL PI INDICATOR 7.5 (GLOVE) ×1
GLOVE SS BIOGEL STRL SZ 7.5 (GLOVE) ×1 IMPLANT
GLOVE SUPERSENSE BIOGEL SZ 7.5 (GLOVE) ×1
GOWN STRL NON-REIN LRG LVL3 (GOWN DISPOSABLE) ×6 IMPLANT
KIT BASIN OR (CUSTOM PROCEDURE TRAY) ×2 IMPLANT
KIT ROOM TURNOVER OR (KITS) ×2 IMPLANT
MANIFOLD NEPTUNE II (INSTRUMENTS) ×2 IMPLANT
NEEDLE 18GX1X1/2 (RX/OR ONLY) (NEEDLE) ×2 IMPLANT
PACK ARTHROSCOPY DSU (CUSTOM PROCEDURE TRAY) ×2 IMPLANT
PAD ARMBOARD 7.5X6 YLW CONV (MISCELLANEOUS) ×4 IMPLANT
PENCIL BUTTON HOLSTER BLD 10FT (ELECTRODE) IMPLANT
SET ARTHROSCOPY TUBING (MISCELLANEOUS) ×2
SET ARTHROSCOPY TUBING LN (MISCELLANEOUS) ×1 IMPLANT
SPONGE GAUZE 4X4 12PLY (GAUZE/BANDAGES/DRESSINGS) ×2 IMPLANT
SPONGE LAP 4X18 X RAY DECT (DISPOSABLE) ×2 IMPLANT
SUT ETHILON 4 0 PS 2 18 (SUTURE) ×2 IMPLANT
SYR 30ML SLIP (SYRINGE) ×2 IMPLANT
TOWEL OR 17X24 6PK STRL BLUE (TOWEL DISPOSABLE) ×2 IMPLANT
TOWEL OR 17X26 10 PK STRL BLUE (TOWEL DISPOSABLE) ×2 IMPLANT
TUBE CONNECTING 12X1/4 (SUCTIONS) ×2 IMPLANT
WAND 90 DEG TURBOVAC W/CORD (SURGICAL WAND) IMPLANT
WATER STERILE IRR 1000ML POUR (IV SOLUTION) ×2 IMPLANT
WRAP KNEE MAXI GEL POST OP (GAUZE/BANDAGES/DRESSINGS) IMPLANT

## 2012-04-21 NOTE — Anesthesia Procedure Notes (Signed)
Procedure Name: LMA Insertion Date/Time: 04/21/2012 11:46 AM Performed by: Ellin Goodie Pre-anesthesia Checklist: Patient identified, Emergency Drugs available, Suction available, Patient being monitored and Timeout performed Patient Re-evaluated:Patient Re-evaluated prior to inductionOxygen Delivery Method: Circle system utilized Preoxygenation: Pre-oxygenation with 100% oxygen Intubation Type: IV induction LMA: LMA with gastric port inserted LMA Size: 5.0 Number of attempts: 1 Placement Confirmation: positive ETCO2 and breath sounds checked- equal and bilateral Tube secured with: Tape Dental Injury: Teeth and Oropharynx as per pre-operative assessment

## 2012-04-21 NOTE — Interval H&P Note (Signed)
History and Physical Interval Note:  04/21/2012 11:37 AM  Marvin Carter  has presented today for surgery, with the diagnosis of right knee meniscus tear, loose body  The various methods of treatment have been discussed with the patient and family. After consideration of risks, benefits and other options for treatment, the patient has consented to  Procedure(s) with comments: ARTHROSCOPY KNEE (Right) - medial and lateral meniscectomies as a surgical intervention .  The patient's history has been reviewed, patient examined, no change in status, stable for surgery.  I have reviewed the patient's chart and labs.  Questions were answered to the patient's satisfaction.     Salvatore Marvel A

## 2012-04-21 NOTE — Transfer of Care (Signed)
Immediate Anesthesia Transfer of Care Note  Patient: Marvin Carter  Procedure(s) Performed: Procedure(s) with comments: ARTHROSCOPY KNEE (Right) - medial and lateral meniscectomies  Patient Location: PACU  Anesthesia Type:General  Level of Consciousness: awake, alert  and oriented  Airway & Oxygen Therapy: Patient Spontanous Breathing  Post-op Assessment: Report given to PACU RN  Post vital signs: stable  Complications: No apparent anesthesia complications

## 2012-04-21 NOTE — H&P (View-Only) (Signed)
Marvin Carter is an 57 y.o. male.   Chief Complaint: right knee medial meniscus tear.  DJD HPI: Marvin Carter is seen for evaluation follow up for significant persistent right knee pain with locking and catching in his right knee that occurs 5-6 times a week and then it goes away.  He feels like something is floating around in the knee and his knee is out of place.  He underwent an MRI of his right knee on February 11, 2012 that revealed an extruded medial meniscus fragment with a horizontal tear, as well as two centimeter loose bodies posterior to the PCL, as well as chondromalacia.  We had operated on his knee 14 months ago and he went into V-tach during surgery.  Had immediate stent placement by Dr. Nanetta Batty.  He is now only on Plavix and Dr. Allyson Sabal has cleared him for any surgery that would be indicated.  He continues to have significant pain in his knee on a daily basis.  He is on Plavix for this and Aspirin as well.  Past Medical History  Diagnosis Date  . Hypertension   . Hyperlipidemia   . Diverticulitis   . Hemorrhoids   . GERD (gastroesophageal reflux disease)   . Anxiety   . Meniscus tear 01/02/2011  . Right knee pain 01/02/2011  . Diverticulosis   . Hemarthrosis of knee, right 01/04/2011  . NSVT (nonsustained ventricular tachycardia) 01/02/2011  . CAD (coronary artery disease), native coronary artery 01/04/2011  . Stented coronary artery 01/04/2011  . Hyperlipemia 01/04/2011  . Atrial flutter, paroxysmal 01/04/2011  . History of meniscectomy of right knee 01/02/2011  . Torn medial meniscus     right knee    Past Surgical History  Procedure Laterality Date  . Appendectomy    . Knee arthroscopy      x 8 left  . Rotator cuff repair      bilateral  . Hand surgery      right  . Bicept tendon repair      bilateral  . Tonsillectomy    . Cholecystectomy      Family History  Problem Relation Age of Onset  . Diabetes Mother   . Heart disease Mother   . Heart attack  Mother   . Heart disease Father   . Coronary artery disease Father    Social History:  reports that he has never smoked. He has never used smokeless tobacco. He reports that  drinks alcohol. He reports that he does not use illicit drugs.  Allergies: No Known Allergies   (Not in a hospital admission)  No results found for this or any previous visit (from the past 48 hour(s)). No results found.  Review of Systems  Constitutional: Negative.   HENT: Negative.   Eyes: Negative.   Respiratory: Negative for cough, hemoptysis, sputum production and wheezing.   Cardiovascular: Negative.  Negative for chest pain, palpitations, orthopnea, claudication, leg swelling and PND.  Gastrointestinal: Negative.   Genitourinary: Negative.   Musculoskeletal: Positive for joint pain.       Right knee pain  Skin: Negative.   Neurological: Negative.   Endo/Heme/Allergies: Negative.   Psychiatric/Behavioral: Negative.     Blood pressure 132/86, pulse 53, temperature 97.6 F (36.4 C), height 5\' 7"  (1.702 m), weight 91.627 kg (202 lb), SpO2 97.00%. Physical Exam  Constitutional: He is oriented to person, place, and time. He appears well-developed and well-nourished.  HENT:  Head: Normocephalic and atraumatic.  Eyes: Conjunctivae are normal. Pupils are equal, round,  and reactive to light.  Neck: Neck supple.  Cardiovascular: Normal rate and regular rhythm.   Respiratory: Effort normal and breath sounds normal.  GI: Soft.  Genitourinary:  Not pertinent to current symptomatology therefore not examined.  Musculoskeletal:  Examination of his right knee reveals pain on the medial joint line and posteriorly.  1+ effusion.  Range of motion 0-120 degrees with catching and locking and positive medial McMurray's.  Knee stable to ligamentous exam with normal patella tracking.  Examination of his left knee reveals 1-2+ crepitation.  1+ synovitis.  Range of motion 0-120 degrees.  Knee is stable with moderate varus  deformity.  Normal patella tracking.  Vascular exam: Pulses are 2+ and symmetric.    Neurological: He is alert and oriented to person, place, and time.  Skin: Skin is warm and dry.  Psychiatric: He has a normal mood and affect. His behavior is normal. Thought content normal.     Assessment Patient Active Problem List  Diagnosis  . Prolapsed external hemorrhoids  . Hemorrhoids, internal, with bleeding  . Meniscus tear  . Right knee pain  . NSVT (nonsustained ventricular tachycardia)  . Atrial flutter, paroxysmal  . CAD (coronary artery disease), native coronary artery  . Stented coronary artery  . Hyperlipemia  . S/P right knee arthroscopy  . History of meniscectomy of right knee  . Hemarthrosis of knee, right  . Torn medial meniscus  . Hypertension  . Hyperlipidemia  . Diverticulitis  . GERD (gastroesophageal reflux disease)  . Anxiety  . Diverticulosis    Plan I have talked to Marvin Carter about this in detail.  He does have significant chondromalacia in his knee on MRI, but all of his symptoms are mechanical in nature.  I do think at this point with his significant disability from this I would recommend that we proceed with arthroscopy with attention to his meniscal pathology, loose bodies and chondral pathology.  I have spoken to Dr. Nanetta Batty about this who has recommended that this procedure be done in the main hospital for intraoperative cardiac monitoring, but if he has no problems postoperatively he does not have to spend the night.  He will need to come off of his Plavix one week pre-op.  Risks, complications and benefits of the surgery have been described to him in detail and he understands this completely.  We will plan on setting him up for this when he is ready to proceed.   Chrissie Dacquisto J 04/10/2012, 5:00 PM

## 2012-04-21 NOTE — Anesthesia Preprocedure Evaluation (Addendum)
Anesthesia Evaluation  Patient identified by MRN, date of birth, ID band Patient awake    Reviewed: Allergy & Precautions, H&P , NPO status , Patient's Chart, lab work & pertinent test results, reviewed documented beta blocker date and time   History of Anesthesia Complications (+) AWARENESS UNDER ANESTHESIA  Airway Mallampati: I TM Distance: >3 FB Neck ROM: Full    Dental  (+) Teeth Intact and Dental Advisory Given   Pulmonary neg pulmonary ROS,  breath sounds clear to auscultation        Cardiovascular hypertension, Pt. on medications + CAD and + Peripheral Vascular Disease + dysrhythmias Atrial Fibrillation Rhythm:Regular  Pt arrested under GA at surgical center in Nov 2012. Had LAD lesion, stent placed. Asymptomatic since.   Neuro/Psych Anxiety Depression negative neurological ROS     GI/Hepatic GERD-  Medicated and Controlled,  Endo/Other    Renal/GU      Musculoskeletal   Abdominal (+)  Abdomen: soft. Bowel sounds: normal.  Peds  Hematology   Anesthesia Other Findings   Reproductive/Obstetrics                       Anesthesia Physical Anesthesia Plan  ASA: III  Anesthesia Plan: General   Post-op Pain Management:    Induction: Intravenous  Airway Management Planned: LMA  Additional Equipment:   Intra-op Plan:   Post-operative Plan: Extubation in OR  Informed Consent: I have reviewed the patients History and Physical, chart, labs and discussed the procedure including the risks, benefits and alternatives for the proposed anesthesia with the patient or authorized representative who has indicated his/her understanding and acceptance.     Plan Discussed with: CRNA and Surgeon  Anesthesia Plan Comments:         Anesthesia Quick Evaluation

## 2012-04-21 NOTE — Preoperative (Signed)
Beta Blockers   Reason not to administer Beta Blockers:Not Applicable 

## 2012-04-21 NOTE — Brief Op Note (Signed)
04/21/2012  12:30 PM  PATIENT:  Marvin Carter  57 y.o. male  PRE-OPERATIVE DIAGNOSIS:  right knee meniscus tear, loose body  POST-OPERATIVE DIAGNOSIS:  right knee meniscus tear  PROCEDURE:  Procedure(s) with comments: ARTHROSCOPY KNEE (Right) - medial and lateral meniscectomies with chondroplasty and loose body excision  SURGEON:  Surgeon(s) and Role:    * Nilda Simmer, MD - Primary  PHYSICIAN ASSISTANT: Julien Girt PA-C  ASSISTANTS: Kirstin Shepperson PA-C   ANESTHESIA:   general  EBL:  Total I/O In: 800 [I.V.:800] Out: -   BLOOD ADMINISTERED:none  DRAINS: none   LOCAL MEDICATIONS USED:  NONE  SPECIMEN:  No Specimen  DISPOSITION OF SPECIMEN:  N/A  COUNTS:  YES  TOURNIQUET:    DICTATION: .Note written in EPIC  PLAN OF CARE: Discharge to home after PACU  PATIENT DISPOSITION:  PACU - hemodynamically stable.   Delay start of Pharmacological VTE agent (>24hrs) due to surgical blood loss or risk of bleeding: not applicable

## 2012-04-21 NOTE — Anesthesia Postprocedure Evaluation (Signed)
Anesthesia Post Note  Patient: Marvin Carter  Procedure(s) Performed: Procedure(s) (LRB): ARTHROSCOPY KNEE (Right)  Anesthesia type: general  Patient location: PACU  Post pain: Pain level controlled  Post assessment: Patient's Cardiovascular Status Stable  Last Vitals:  Filed Vitals:   04/21/12 1350  BP: 118/64  Pulse: 57  Temp: 36.6 C  Resp: 16    Post vital signs: Reviewed and stable  Level of consciousness: sedated  Complications: No apparent anesthesia complications

## 2012-04-22 ENCOUNTER — Encounter (HOSPITAL_COMMUNITY): Payer: Self-pay | Admitting: Orthopedic Surgery

## 2012-04-23 ENCOUNTER — Encounter (HOSPITAL_COMMUNITY): Payer: Self-pay

## 2012-04-23 NOTE — Op Note (Signed)
Marvin Carter, [name] NO.:  [id number]  MEDICAL RECORD NO.:  [id number]  LOCATION:                                 FACILITY:  PHYSICIAN:  Marvin Alm. Thurston Carter, M.D. DATE OF BIRTH:  [birth year]  DATE OF PROCEDURE:  04/21/2012 DATE OF DISCHARGE:                              OPERATIVE REPORT   PREOPERATIVE DIAGNOSES: 1. Right knee medial lateral meniscal tears with chondromalacia and     synovitis. 2. Right knee loose body.  POSTOPERATIVE DIAGNOSIS: 1. Right knee medial lateral meniscal tears with chondromalacia and     synovitis. 2. Right knee loose body.  PROCEDURE: 1. Right knee EUA followed by arthroscopic partial medial     meniscectomies with chondroplasty and partial synovectomy. 2. Right knee loose body excision.  SURGEON:  Marvin Simmer, MD.  ASSISTANT:  Marvin Girt, PA-C.  ANESTHESIA:  General.  OPERATIVE TIME:  40 minutes.  COMPLICATIONS:  None.  INDICATION FOR PROCEDURE:  This patient is a 57 year old gentleman who has had 8-12 months of increasing right knee pain with exam and MRI documenting meniscal tearing with chondromalacia and possible loose body.  He has failed long multiple conservative modalities and is now to undergo arthroscopy.  DESCRIPTION:  This patient was brought to the operating room on April 21, 2012 after knee block was placed holding by anesthesia.  He was placed on operative table in supine position.  He received Ancef 2 g IV preoperatively for prophylaxis.  After being placed under general anesthesia, his right knee was examined.  Range of motion was from -5 to 125 degrees.  Knee was stable.  Ligamentous exam with normal patellar tracking.  The right leg was prepped using sterile DuraPrep and draped using sterile technique.  Time-out procedure was called and the correct right knee identified.  Initially, through an anterolateral portal, the arthroscope with a pump attached was placed into an anteromedial  portal, an arthroscopic probe was placed.  On initial inspection medial compartment, the articular cartilage showed 15% grade 4 and 75% grade 3 chondromalacia which was debrided.  Medial meniscus showed complex tearing of the posterior medial horn of which 50% to 60% was resected back to stable, but degenerative rim.  Intercondylar notch inspected anterior posterior cruciate ligaments were normal.  There were large osteophytes in the notch, but they were not impinging on motion.  There was a significant loose body in the notch measuring 2 x 1 cm which was removed.  Lateral compartment showed 30% to 40% grade 3 chondromalacia which was debrided.  Lateral meniscus showed tearing of the posterior and lateral horn of which 30% to 40% was resected back to a stable rim. Posterior lateral corner around the popliteus tendon and posterior compartment in this region was inspected and no loose body was noted. Patellofemoral joint showed 80% grade 4 and 20% grade 3 chondromalacia which was debrided.  The patella tracked normally.  Significant synovitis in medial gutters were debrided.  Osteophytes in the femoral condyles were noted, but they were not loose and not impinging on motion and they were not disturbed otherwise medial gutters were free of pathology.  After this done it was felt that all pathology  then satisfactorily addressed.  The instruments removed.  Portals closed with 3-0 nylon suture.  Sterile dressings were applied.  The patient awakened and taken to recovery in stable condition.  FOLLOWUP CARE:  This patient will be followed as an outpatient, on Percocet for pain.  He will be seen back in the office in a week for sutures out and follow up.     Marvin A. Thurston Carter, M.D.     RAW/MEDQ  D:  04/21/2012  T:  04/21/2012  Job:  [id number]

## 2012-04-25 ENCOUNTER — Encounter (HOSPITAL_COMMUNITY): Payer: Self-pay

## 2012-04-28 ENCOUNTER — Encounter (HOSPITAL_COMMUNITY): Payer: Self-pay

## 2012-04-30 ENCOUNTER — Encounter (HOSPITAL_COMMUNITY)
Admission: RE | Admit: 2012-04-30 | Discharge: 2012-04-30 | Disposition: A | Discharge status: Home / Self Care | Payer: Self-pay | Source: Ambulatory Visit | Attending: Cardiovascular Disease | Admitting: Cardiovascular Disease

## 2012-05-02 ENCOUNTER — Encounter (HOSPITAL_COMMUNITY)
Admission: RE | Admit: 2012-05-02 | Discharge: 2012-05-02 | Disposition: A | Discharge status: Home / Self Care | Payer: Self-pay | Source: Ambulatory Visit | Attending: Cardiovascular Disease | Admitting: Cardiovascular Disease

## 2012-05-05 ENCOUNTER — Encounter (HOSPITAL_COMMUNITY)
Admission: RE | Admit: 2012-05-05 | Discharge: 2012-05-05 | Disposition: A | Discharge status: Home / Self Care | Payer: Self-pay | Source: Ambulatory Visit | Attending: Cardiovascular Disease | Admitting: Cardiovascular Disease

## 2012-05-07 ENCOUNTER — Encounter (HOSPITAL_COMMUNITY)
Admission: RE | Admit: 2012-05-07 | Discharge: 2012-05-07 | Disposition: A | Discharge status: Home / Self Care | Payer: Self-pay | Source: Ambulatory Visit | Attending: Cardiovascular Disease | Admitting: Cardiovascular Disease

## 2012-05-09 ENCOUNTER — Encounter (HOSPITAL_COMMUNITY)
Admission: RE | Admit: 2012-05-09 | Discharge: 2012-05-09 | Disposition: A | Discharge status: Home / Self Care | Payer: Self-pay | Source: Ambulatory Visit | Attending: Cardiovascular Disease | Admitting: Cardiovascular Disease

## 2012-05-12 ENCOUNTER — Encounter (HOSPITAL_COMMUNITY)
Admission: RE | Admit: 2012-05-12 | Discharge: 2012-05-12 | Disposition: A | Discharge status: Home / Self Care | Payer: Self-pay | Source: Ambulatory Visit | Attending: Cardiovascular Disease | Admitting: Cardiovascular Disease

## 2012-05-14 ENCOUNTER — Encounter (HOSPITAL_COMMUNITY)
Admission: RE | Admit: 2012-05-14 | Discharge: 2012-05-14 | Disposition: A | Discharge status: Home / Self Care | Payer: Self-pay | Source: Ambulatory Visit | Attending: Cardiovascular Disease | Admitting: Cardiovascular Disease

## 2012-05-16 ENCOUNTER — Encounter (HOSPITAL_COMMUNITY)
Admission: RE | Admit: 2012-05-16 | Discharge: 2012-05-16 | Disposition: A | Discharge status: Home / Self Care | Payer: Self-pay | Source: Ambulatory Visit | Attending: Cardiovascular Disease | Admitting: Cardiovascular Disease

## 2012-05-19 ENCOUNTER — Encounter (HOSPITAL_COMMUNITY)
Admission: RE | Admit: 2012-05-19 | Discharge: 2012-05-19 | Disposition: A | Discharge status: Home / Self Care | Payer: Self-pay | Source: Ambulatory Visit | Attending: Cardiovascular Disease | Admitting: Cardiovascular Disease

## 2012-05-21 ENCOUNTER — Encounter (HOSPITAL_COMMUNITY)
Admission: RE | Admit: 2012-05-21 | Discharge: 2012-05-21 | Disposition: A | Discharge status: Home / Self Care | Payer: Self-pay | Source: Ambulatory Visit | Attending: Cardiovascular Disease | Admitting: Cardiovascular Disease

## 2012-05-21 DIAGNOSIS — K219 Gastro-esophageal reflux disease without esophagitis: Secondary | ICD-10-CM | POA: Insufficient documentation

## 2012-05-21 DIAGNOSIS — I472 Ventricular tachycardia, unspecified: Secondary | ICD-10-CM | POA: Insufficient documentation

## 2012-05-21 DIAGNOSIS — I2510 Atherosclerotic heart disease of native coronary artery without angina pectoris: Secondary | ICD-10-CM | POA: Insufficient documentation

## 2012-05-21 DIAGNOSIS — E785 Hyperlipidemia, unspecified: Secondary | ICD-10-CM | POA: Insufficient documentation

## 2012-05-21 DIAGNOSIS — I4729 Other ventricular tachycardia: Secondary | ICD-10-CM | POA: Insufficient documentation

## 2012-05-21 DIAGNOSIS — Z9861 Coronary angioplasty status: Secondary | ICD-10-CM | POA: Insufficient documentation

## 2012-05-21 DIAGNOSIS — I4892 Unspecified atrial flutter: Secondary | ICD-10-CM | POA: Insufficient documentation

## 2012-05-21 DIAGNOSIS — Z5189 Encounter for other specified aftercare: Secondary | ICD-10-CM | POA: Insufficient documentation

## 2012-05-21 DIAGNOSIS — I10 Essential (primary) hypertension: Secondary | ICD-10-CM | POA: Insufficient documentation

## 2012-05-23 ENCOUNTER — Encounter (HOSPITAL_COMMUNITY)
Admission: RE | Admit: 2012-05-23 | Discharge: 2012-05-23 | Disposition: A | Discharge status: Home / Self Care | Payer: Self-pay | Source: Ambulatory Visit | Attending: Cardiovascular Disease | Admitting: Cardiovascular Disease

## 2012-05-26 ENCOUNTER — Encounter (HOSPITAL_COMMUNITY)
Admission: RE | Admit: 2012-05-26 | Discharge: 2012-05-26 | Disposition: A | Discharge status: Home / Self Care | Payer: Self-pay | Source: Ambulatory Visit | Attending: Cardiovascular Disease | Admitting: Cardiovascular Disease

## 2012-05-28 ENCOUNTER — Encounter (HOSPITAL_COMMUNITY)
Admission: RE | Admit: 2012-05-28 | Discharge: 2012-05-28 | Disposition: A | Discharge status: Home / Self Care | Payer: Self-pay | Source: Ambulatory Visit | Attending: Cardiovascular Disease | Admitting: Cardiovascular Disease

## 2012-05-30 ENCOUNTER — Encounter (HOSPITAL_COMMUNITY)
Admission: RE | Admit: 2012-05-30 | Discharge: 2012-05-30 | Disposition: A | Discharge status: Home / Self Care | Payer: Self-pay | Source: Ambulatory Visit | Attending: Cardiovascular Disease | Admitting: Cardiovascular Disease

## 2012-06-02 ENCOUNTER — Encounter (HOSPITAL_COMMUNITY)
Admission: RE | Admit: 2012-06-02 | Discharge: 2012-06-02 | Disposition: A | Discharge status: Home / Self Care | Payer: Self-pay | Source: Ambulatory Visit | Attending: Cardiovascular Disease | Admitting: Cardiovascular Disease

## 2012-06-04 ENCOUNTER — Encounter (HOSPITAL_COMMUNITY)
Admission: RE | Admit: 2012-06-04 | Discharge: 2012-06-04 | Disposition: A | Discharge status: Home / Self Care | Payer: Self-pay | Source: Ambulatory Visit | Attending: Cardiovascular Disease | Admitting: Cardiovascular Disease

## 2012-06-06 ENCOUNTER — Encounter (HOSPITAL_COMMUNITY)
Admission: RE | Admit: 2012-06-06 | Discharge: 2012-06-06 | Disposition: A | Discharge status: Home / Self Care | Payer: Self-pay | Source: Ambulatory Visit | Attending: Cardiovascular Disease | Admitting: Cardiovascular Disease

## 2012-06-09 ENCOUNTER — Encounter (HOSPITAL_COMMUNITY)
Admission: RE | Admit: 2012-06-09 | Discharge: 2012-06-09 | Disposition: A | Discharge status: Home / Self Care | Payer: Self-pay | Source: Ambulatory Visit | Attending: Cardiovascular Disease | Admitting: Cardiovascular Disease

## 2012-06-11 ENCOUNTER — Encounter (HOSPITAL_COMMUNITY)
Admission: RE | Admit: 2012-06-11 | Discharge: 2012-06-11 | Disposition: A | Discharge status: Home / Self Care | Payer: Self-pay | Source: Ambulatory Visit | Attending: Cardiovascular Disease | Admitting: Cardiovascular Disease

## 2012-06-13 ENCOUNTER — Encounter (HOSPITAL_COMMUNITY)
Admission: RE | Admit: 2012-06-13 | Discharge: 2012-06-13 | Disposition: A | Discharge status: Home / Self Care | Payer: Self-pay | Source: Ambulatory Visit | Attending: Cardiovascular Disease | Admitting: Cardiovascular Disease

## 2012-06-16 ENCOUNTER — Encounter (HOSPITAL_COMMUNITY)
Admission: RE | Admit: 2012-06-16 | Discharge: 2012-06-16 | Disposition: A | Discharge status: Home / Self Care | Payer: Self-pay | Source: Ambulatory Visit | Attending: Cardiovascular Disease | Admitting: Cardiovascular Disease

## 2012-06-18 ENCOUNTER — Encounter (HOSPITAL_COMMUNITY)
Admission: RE | Admit: 2012-06-18 | Discharge: 2012-06-18 | Disposition: A | Discharge status: Home / Self Care | Payer: Self-pay | Source: Ambulatory Visit | Attending: Cardiovascular Disease | Admitting: Cardiovascular Disease

## 2012-06-20 ENCOUNTER — Encounter (HOSPITAL_COMMUNITY): Payer: Self-pay

## 2012-06-20 DIAGNOSIS — I472 Ventricular tachycardia, unspecified: Secondary | ICD-10-CM | POA: Insufficient documentation

## 2012-06-20 DIAGNOSIS — I10 Essential (primary) hypertension: Secondary | ICD-10-CM | POA: Insufficient documentation

## 2012-06-20 DIAGNOSIS — E785 Hyperlipidemia, unspecified: Secondary | ICD-10-CM | POA: Insufficient documentation

## 2012-06-20 DIAGNOSIS — I2510 Atherosclerotic heart disease of native coronary artery without angina pectoris: Secondary | ICD-10-CM | POA: Insufficient documentation

## 2012-06-20 DIAGNOSIS — Z9861 Coronary angioplasty status: Secondary | ICD-10-CM | POA: Insufficient documentation

## 2012-06-20 DIAGNOSIS — I4892 Unspecified atrial flutter: Secondary | ICD-10-CM | POA: Insufficient documentation

## 2012-06-20 DIAGNOSIS — Z5189 Encounter for other specified aftercare: Secondary | ICD-10-CM | POA: Insufficient documentation

## 2012-06-20 DIAGNOSIS — K219 Gastro-esophageal reflux disease without esophagitis: Secondary | ICD-10-CM | POA: Insufficient documentation

## 2012-06-20 DIAGNOSIS — I4729 Other ventricular tachycardia: Secondary | ICD-10-CM | POA: Insufficient documentation

## 2012-06-23 ENCOUNTER — Encounter (HOSPITAL_COMMUNITY)
Admission: RE | Admit: 2012-06-23 | Discharge: 2012-06-23 | Disposition: A | Discharge status: Home / Self Care | Payer: Self-pay | Source: Ambulatory Visit | Attending: Cardiovascular Disease | Admitting: Cardiovascular Disease

## 2012-06-25 ENCOUNTER — Encounter (HOSPITAL_COMMUNITY)
Admission: RE | Admit: 2012-06-25 | Discharge: 2012-06-25 | Disposition: A | Discharge status: Home / Self Care | Payer: Self-pay | Source: Ambulatory Visit | Attending: Cardiovascular Disease | Admitting: Cardiovascular Disease

## 2012-06-27 ENCOUNTER — Encounter (HOSPITAL_COMMUNITY)
Admission: RE | Admit: 2012-06-27 | Discharge: 2012-06-27 | Disposition: A | Discharge status: Home / Self Care | Payer: Self-pay | Source: Ambulatory Visit | Attending: Cardiovascular Disease | Admitting: Cardiovascular Disease

## 2012-06-30 ENCOUNTER — Encounter (HOSPITAL_COMMUNITY)
Admission: RE | Admit: 2012-06-30 | Discharge: 2012-06-30 | Disposition: A | Discharge status: Home / Self Care | Payer: Self-pay | Source: Ambulatory Visit | Attending: Cardiovascular Disease | Admitting: Cardiovascular Disease

## 2012-07-02 ENCOUNTER — Encounter (HOSPITAL_COMMUNITY): Payer: Self-pay

## 2012-07-04 ENCOUNTER — Encounter (HOSPITAL_COMMUNITY)
Admission: RE | Admit: 2012-07-04 | Discharge: 2012-07-04 | Disposition: A | Discharge status: Home / Self Care | Payer: Self-pay | Source: Ambulatory Visit | Attending: Cardiovascular Disease | Admitting: Cardiovascular Disease

## 2012-07-07 ENCOUNTER — Encounter (HOSPITAL_COMMUNITY)
Admission: RE | Admit: 2012-07-07 | Discharge: 2012-07-07 | Disposition: A | Discharge status: Home / Self Care | Payer: Self-pay | Source: Ambulatory Visit | Attending: Cardiovascular Disease | Admitting: Cardiovascular Disease

## 2012-07-09 ENCOUNTER — Encounter (HOSPITAL_COMMUNITY): Payer: Self-pay

## 2012-07-11 ENCOUNTER — Encounter (HOSPITAL_COMMUNITY): Payer: Self-pay

## 2012-07-14 ENCOUNTER — Encounter (HOSPITAL_COMMUNITY): Payer: Self-pay

## 2012-07-16 ENCOUNTER — Encounter (HOSPITAL_COMMUNITY)
Admission: RE | Admit: 2012-07-16 | Discharge: 2012-07-16 | Disposition: A | Discharge status: Home / Self Care | Payer: Self-pay | Source: Ambulatory Visit | Attending: Cardiovascular Disease | Admitting: Cardiovascular Disease

## 2012-07-18 ENCOUNTER — Encounter (HOSPITAL_COMMUNITY)
Admission: RE | Admit: 2012-07-18 | Discharge: 2012-07-18 | Disposition: A | Discharge status: Home / Self Care | Payer: Self-pay | Source: Ambulatory Visit | Attending: Cardiovascular Disease | Admitting: Cardiovascular Disease

## 2012-07-21 ENCOUNTER — Encounter (HOSPITAL_COMMUNITY)
Admission: RE | Admit: 2012-07-21 | Discharge: 2012-07-21 | Disposition: A | Discharge status: Home / Self Care | Payer: Self-pay | Source: Ambulatory Visit | Attending: Cardiovascular Disease | Admitting: Cardiovascular Disease

## 2012-07-21 DIAGNOSIS — Z9861 Coronary angioplasty status: Secondary | ICD-10-CM | POA: Insufficient documentation

## 2012-07-21 DIAGNOSIS — I2510 Atherosclerotic heart disease of native coronary artery without angina pectoris: Secondary | ICD-10-CM | POA: Insufficient documentation

## 2012-07-21 DIAGNOSIS — Z5189 Encounter for other specified aftercare: Secondary | ICD-10-CM | POA: Insufficient documentation

## 2012-07-21 DIAGNOSIS — I10 Essential (primary) hypertension: Secondary | ICD-10-CM | POA: Insufficient documentation

## 2012-07-21 DIAGNOSIS — E785 Hyperlipidemia, unspecified: Secondary | ICD-10-CM | POA: Insufficient documentation

## 2012-07-23 ENCOUNTER — Encounter (HOSPITAL_COMMUNITY): Payer: Self-pay

## 2012-07-25 ENCOUNTER — Encounter (HOSPITAL_COMMUNITY)
Admission: RE | Admit: 2012-07-25 | Discharge: 2012-07-25 | Disposition: A | Discharge status: Home / Self Care | Payer: Self-pay | Source: Ambulatory Visit | Attending: Cardiovascular Disease | Admitting: Cardiovascular Disease

## 2012-07-28 ENCOUNTER — Encounter (HOSPITAL_COMMUNITY)
Admission: RE | Admit: 2012-07-28 | Discharge: 2012-07-28 | Disposition: A | Discharge status: Home / Self Care | Payer: Self-pay | Source: Ambulatory Visit | Attending: Cardiovascular Disease | Admitting: Cardiovascular Disease

## 2012-07-29 ENCOUNTER — Encounter: Payer: Self-pay | Admitting: Cardiology

## 2012-07-29 ENCOUNTER — Ambulatory Visit (INDEPENDENT_AMBULATORY_CARE_PROVIDER_SITE_OTHER): Payer: 59 | Admitting: Cardiology

## 2012-07-29 VITALS — BP 104/64 | HR 67 | Ht 69.0 in | Wt 206.3 lb

## 2012-07-29 DIAGNOSIS — I2510 Atherosclerotic heart disease of native coronary artery without angina pectoris: Secondary | ICD-10-CM

## 2012-07-29 DIAGNOSIS — I10 Essential (primary) hypertension: Secondary | ICD-10-CM

## 2012-07-29 DIAGNOSIS — E785 Hyperlipidemia, unspecified: Secondary | ICD-10-CM

## 2012-07-29 MED ORDER — PANTOPRAZOLE SODIUM 40 MG PO TBEC: 40.0000 mg | DELAYED_RELEASE_TABLET | Freq: Every day | ORAL | Status: DC

## 2012-07-29 NOTE — Progress Notes (Signed)
07/30/2012   PCP: Nadean Corwin, MD   Chief Complaint  Patient presents with  . follow up cardiac rehab    no complaints    Primary Cardiologist:Dr. Erlene Quan   HPI:  57 year old white married male with history of hypertension and hyperlipidemia as well as strong family history of coronary disease. After knee surgery by Dr. Thurston Hole he had 30 seconds of the ventricular tachycardia and underwent cardiac catheterization November 2012 he had a 70% mid LAD lesion which was found to be physiologically significant by FFR. Dr. Allyson Sabal stented him with a resolute drug-eluting stent. He went to cardiac rehabilitation and has done quite well. His cholesterol panel status is excellent.  Today he has no complaints the chest pain or shortness of breath.  He is taking aspirin 81 mg daily.  Allergies  Allergen Reactions  . Tape Rash    Clear tape    Current Outpatient Prescriptions  Medication Sig Dispense Refill  . ALPRAZolam (XANAX) 1 MG tablet Take 1 mg by mouth 3 (three) times daily as needed. For anxiety      . atenolol (TENORMIN) 100 MG tablet Take 100 mg by mouth daily.       Marland Kitchen atorvastatin (LIPITOR) 40 MG tablet Take 1 tablet (40 mg total) by mouth daily.  30 tablet  11  . buPROPion (WELLBUTRIN XL) 300 MG 24 hr tablet Take 300 mg by mouth daily.       . clopidogrel (PLAVIX) 75 MG tablet Take 75 mg by mouth daily.      Marland Kitchen diltiazem (TIAZAC) 240 MG 24 hr capsule Take 240 mg by mouth daily.      . fenofibrate micronized (LOFIBRA) 134 MG capsule Take 134 mg by mouth daily before breakfast.       . fish oil-omega-3 fatty acids 1000 MG capsule Take 1 g by mouth daily.      . furosemide (LASIX) 80 MG tablet Take 80 mg by mouth daily.        . Multiple Vitamin (MULTIVITAMIN WITH MINERALS) TABS Take 1 tablet by mouth daily.      Marland Kitchen olmesartan (BENICAR) 40 MG tablet Take 40 mg by mouth daily.        Marland Kitchen oxyCODONE (OXY IR/ROXICODONE) 5 MG immediate release tablet 1-2 tablets every 4-6  hrs as needed for pain  40 tablet  0  . pantoprazole (PROTONIX) 40 MG tablet Take 1 tablet (40 mg total) by mouth daily.  90 tablet  3  . testosterone cypionate (DEPOTESTOTERONE CYPIONATE) 200 MG/ML injection Inject 200 mg into the muscle every 28 (twenty-eight) days.      Marland Kitchen esomeprazole (NEXIUM) 40 MG capsule Take 40 mg by mouth daily before breakfast.        No current facility-administered medications for this visit.    Past Medical History  Diagnosis Date  . Hypertension   . Hyperlipidemia   . Diverticulitis   . Hemorrhoids   . GERD (gastroesophageal reflux disease)   . Anxiety   . Meniscus tear 01/02/2011  . Right knee pain 01/02/2011  . Diverticulosis   . Hemarthrosis of knee, right 01/04/2011  . CAD (coronary artery disease), native coronary artery 01/04/2011  . Stented coronary artery 01/04/2011    LAD DES Resolute.  . Hyperlipemia 01/04/2011  . History of meniscectomy of right knee 01/02/2011  . Torn medial meniscus     right knee  . NSVT (nonsustained ventricular tachycardia) 01/02/2011  . Atrial flutter, paroxysmal 01/04/2011  .  Arthritis     Past Surgical History  Procedure Laterality Date  . Appendectomy    . Knee arthroscopy      x 8 left  . Rotator cuff repair      bilateral  . Hand surgery      right  . Bicept tendon repair      bilateral  . Tonsillectomy    . Knee arthroscopy Right 04/21/2012    Procedure: ARTHROSCOPY KNEE;  Surgeon: Nilda Simmer, MD;  Location: [facility] OR;  Service: Orthopedics;  Laterality: Right;  medial and lateral meniscectomies  . Coronary angioplasty with stent placement  2012    ZOX:WRUEAVW:UJ colds or fevers, no weight changes Skin:no rashes or ulcers HEENT:no blurred vision, no congestion CV:see HPI PUL:see HPI GI:no diarrhea constipation or melena, no indigestion GU:no hematuria, no dysuria MS:no joint pain, no claudication Neuro:no syncope, no lightheadedness Endo:no diabetes, no thyroid disease  PHYSICAL EXAM BP  104/64  Pulse 67  Ht 5\' 9"  (1.753 m)  Wt 206 lb 4.8 oz (93.577 kg)  BMI 30.45 kg/m2 General:Pleasant affect, NAD Skin:Warm and dry, brisk capillary refill HEENT:normocephalic, sclera clear, mucus membranes moist Neck:supple, no JVD, no bruits  Heart:S1S2 RRR without murmur, gallup, rub or click Lungs:clear without rales, rhonchi, or wheezes WJX:BJYN, non tender, + BS, do not palpate liver spleen or masses Ext:no lower ext edema, 2+ pedal pulses, 2+ radial pulses Neuro:alert and oriented, MAE, follows commands, + facial symmetry  EKG: Sinus rhythm nonspecific ST changes but no changes at all from his previous EKG. Heart rate 67.  ASSESSMENT AND PLAN CAD (coronary artery disease), native coronary artery Stable, no chest pain.  Follow up in 6 months with Dr. Allyson Sabal, resolute drug-eluting stent was placed in November 2013 to the LAD  Hyperlipemia Excellent numbers  Hypertension stable  Hyperlipidemia From 06/18/2012 total cholesterol 149 triglycerides 121 HDL 50 LDL 75 excellent lipid panel.

## 2012-07-29 NOTE — Patient Instructions (Addendum)
Your physician recommends that you schedule a follow-up appointment in: 6 months with Dr. Allyson Sabal. Call if you have any chest pain or SOB or concerns.    Your prescription for pantoprazole 40mg  daily has been sent to [facility].

## 2012-07-29 NOTE — Assessment & Plan Note (Signed)
Excellent numbers.

## 2012-07-29 NOTE — Assessment & Plan Note (Addendum)
Stable, no chest pain.  Follow up in 6 months with Dr. Allyson Sabal, resolute drug-eluting stent was placed in November 2013 to the LAD

## 2012-07-30 ENCOUNTER — Encounter: Payer: Self-pay | Admitting: Cardiology

## 2012-07-30 ENCOUNTER — Encounter (HOSPITAL_COMMUNITY)
Admission: RE | Admit: 2012-07-30 | Discharge: 2012-07-30 | Disposition: A | Discharge status: Home / Self Care | Payer: Self-pay | Source: Ambulatory Visit | Attending: Cardiovascular Disease | Admitting: Cardiovascular Disease

## 2012-07-30 NOTE — Assessment & Plan Note (Signed)
stable °

## 2012-07-30 NOTE — Assessment & Plan Note (Signed)
From 06/18/2012 total cholesterol 149 triglycerides 121 HDL 50 LDL 75 excellent lipid panel.

## 2012-07-31 ENCOUNTER — Encounter: Payer: Self-pay | Admitting: Cardiology

## 2012-08-01 ENCOUNTER — Encounter (HOSPITAL_COMMUNITY): Payer: Self-pay

## 2012-08-04 ENCOUNTER — Encounter (HOSPITAL_COMMUNITY)
Admission: RE | Admit: 2012-08-04 | Discharge: 2012-08-04 | Disposition: A | Discharge status: Home / Self Care | Payer: Self-pay | Source: Ambulatory Visit | Attending: Cardiovascular Disease | Admitting: Cardiovascular Disease

## 2012-08-06 ENCOUNTER — Encounter (HOSPITAL_COMMUNITY): Payer: Self-pay

## 2012-08-08 ENCOUNTER — Encounter (HOSPITAL_COMMUNITY)
Admission: RE | Admit: 2012-08-08 | Discharge: 2012-08-08 | Disposition: A | Discharge status: Home / Self Care | Payer: Self-pay | Source: Ambulatory Visit | Attending: Cardiovascular Disease | Admitting: Cardiovascular Disease

## 2012-08-11 ENCOUNTER — Encounter (HOSPITAL_COMMUNITY)
Admission: RE | Admit: 2012-08-11 | Discharge: 2012-08-11 | Disposition: A | Discharge status: Home / Self Care | Payer: Self-pay | Source: Ambulatory Visit | Attending: Cardiovascular Disease | Admitting: Cardiovascular Disease

## 2012-08-13 ENCOUNTER — Encounter (HOSPITAL_COMMUNITY)
Admission: RE | Admit: 2012-08-13 | Discharge: 2012-08-13 | Disposition: A | Discharge status: Home / Self Care | Payer: Self-pay | Source: Ambulatory Visit | Attending: Cardiovascular Disease | Admitting: Cardiovascular Disease

## 2012-08-15 ENCOUNTER — Encounter (HOSPITAL_COMMUNITY)
Admission: RE | Admit: 2012-08-15 | Discharge: 2012-08-15 | Disposition: A | Discharge status: Home / Self Care | Payer: Self-pay | Source: Ambulatory Visit | Attending: Cardiovascular Disease | Admitting: Cardiovascular Disease

## 2012-08-18 ENCOUNTER — Encounter (HOSPITAL_COMMUNITY)
Admission: RE | Admit: 2012-08-18 | Discharge: 2012-08-18 | Disposition: A | Discharge status: Home / Self Care | Payer: Self-pay | Source: Ambulatory Visit | Attending: Cardiovascular Disease | Admitting: Cardiovascular Disease

## 2012-08-20 ENCOUNTER — Encounter (HOSPITAL_COMMUNITY)
Admission: RE | Admit: 2012-08-20 | Discharge: 2012-08-20 | Disposition: A | Discharge status: Home / Self Care | Payer: Self-pay | Source: Ambulatory Visit | Attending: Cardiovascular Disease | Admitting: Cardiovascular Disease

## 2012-08-20 DIAGNOSIS — I10 Essential (primary) hypertension: Secondary | ICD-10-CM | POA: Insufficient documentation

## 2012-08-20 DIAGNOSIS — Z9861 Coronary angioplasty status: Secondary | ICD-10-CM | POA: Insufficient documentation

## 2012-08-20 DIAGNOSIS — E785 Hyperlipidemia, unspecified: Secondary | ICD-10-CM | POA: Insufficient documentation

## 2012-08-20 DIAGNOSIS — Z5189 Encounter for other specified aftercare: Secondary | ICD-10-CM | POA: Insufficient documentation

## 2012-08-20 DIAGNOSIS — I2510 Atherosclerotic heart disease of native coronary artery without angina pectoris: Secondary | ICD-10-CM | POA: Insufficient documentation

## 2012-08-25 ENCOUNTER — Encounter (HOSPITAL_COMMUNITY)
Admission: RE | Admit: 2012-08-25 | Discharge: 2012-08-25 | Disposition: A | Discharge status: Home / Self Care | Payer: Self-pay | Source: Ambulatory Visit | Attending: Cardiovascular Disease | Admitting: Cardiovascular Disease

## 2012-08-27 ENCOUNTER — Encounter (HOSPITAL_COMMUNITY)
Admission: RE | Admit: 2012-08-27 | Discharge: 2012-08-27 | Disposition: A | Discharge status: Home / Self Care | Payer: Self-pay | Source: Ambulatory Visit | Attending: Cardiovascular Disease | Admitting: Cardiovascular Disease

## 2012-08-29 ENCOUNTER — Encounter (HOSPITAL_COMMUNITY)
Admission: RE | Admit: 2012-08-29 | Discharge: 2012-08-29 | Disposition: A | Discharge status: Home / Self Care | Payer: Self-pay | Source: Ambulatory Visit | Attending: Cardiovascular Disease | Admitting: Cardiovascular Disease

## 2012-09-01 ENCOUNTER — Encounter (HOSPITAL_COMMUNITY)
Admission: RE | Admit: 2012-09-01 | Discharge: 2012-09-01 | Disposition: A | Discharge status: Home / Self Care | Payer: Self-pay | Source: Ambulatory Visit | Attending: Cardiovascular Disease | Admitting: Cardiovascular Disease

## 2012-09-03 ENCOUNTER — Encounter (HOSPITAL_COMMUNITY)
Admission: RE | Admit: 2012-09-03 | Discharge: 2012-09-03 | Disposition: A | Discharge status: Home / Self Care | Payer: Self-pay | Source: Ambulatory Visit | Attending: Cardiovascular Disease | Admitting: Cardiovascular Disease

## 2012-09-05 ENCOUNTER — Encounter (HOSPITAL_COMMUNITY)
Admission: RE | Admit: 2012-09-05 | Discharge: 2012-09-05 | Disposition: A | Discharge status: Home / Self Care | Payer: Self-pay | Source: Ambulatory Visit | Attending: Cardiovascular Disease | Admitting: Cardiovascular Disease

## 2012-09-08 ENCOUNTER — Encounter (HOSPITAL_COMMUNITY)
Admission: RE | Admit: 2012-09-08 | Discharge: 2012-09-08 | Disposition: A | Discharge status: Home / Self Care | Payer: Self-pay | Source: Ambulatory Visit | Attending: Cardiovascular Disease | Admitting: Cardiovascular Disease

## 2012-09-10 ENCOUNTER — Encounter (HOSPITAL_COMMUNITY)
Admission: RE | Admit: 2012-09-10 | Discharge: 2012-09-10 | Disposition: A | Discharge status: Home / Self Care | Payer: Self-pay | Source: Ambulatory Visit | Attending: Cardiovascular Disease | Admitting: Cardiovascular Disease

## 2012-09-12 ENCOUNTER — Encounter (HOSPITAL_COMMUNITY)
Admission: RE | Admit: 2012-09-12 | Discharge: 2012-09-12 | Disposition: A | Discharge status: Home / Self Care | Payer: Self-pay | Source: Ambulatory Visit | Attending: Cardiovascular Disease | Admitting: Cardiovascular Disease

## 2012-09-15 ENCOUNTER — Encounter (HOSPITAL_COMMUNITY): Payer: Self-pay

## 2012-09-17 ENCOUNTER — Encounter (HOSPITAL_COMMUNITY)
Admission: RE | Admit: 2012-09-17 | Discharge: 2012-09-17 | Disposition: A | Discharge status: Home / Self Care | Payer: Self-pay | Source: Ambulatory Visit | Attending: Cardiovascular Disease | Admitting: Cardiovascular Disease

## 2012-09-19 ENCOUNTER — Encounter (HOSPITAL_COMMUNITY)
Admission: RE | Admit: 2012-09-19 | Discharge: 2012-09-19 | Disposition: A | Discharge status: Home / Self Care | Payer: Self-pay | Source: Ambulatory Visit | Attending: Cardiovascular Disease | Admitting: Cardiovascular Disease

## 2012-09-19 DIAGNOSIS — E785 Hyperlipidemia, unspecified: Secondary | ICD-10-CM | POA: Insufficient documentation

## 2012-09-19 DIAGNOSIS — I10 Essential (primary) hypertension: Secondary | ICD-10-CM | POA: Insufficient documentation

## 2012-09-19 DIAGNOSIS — Z9861 Coronary angioplasty status: Secondary | ICD-10-CM | POA: Insufficient documentation

## 2012-09-19 DIAGNOSIS — Z5189 Encounter for other specified aftercare: Secondary | ICD-10-CM | POA: Insufficient documentation

## 2012-09-19 DIAGNOSIS — I2510 Atherosclerotic heart disease of native coronary artery without angina pectoris: Secondary | ICD-10-CM | POA: Insufficient documentation

## 2012-09-22 ENCOUNTER — Encounter (HOSPITAL_COMMUNITY): Payer: Self-pay

## 2012-09-24 ENCOUNTER — Encounter (HOSPITAL_COMMUNITY)
Admission: RE | Admit: 2012-09-24 | Discharge: 2012-09-24 | Disposition: A | Discharge status: Home / Self Care | Payer: Self-pay | Source: Ambulatory Visit | Attending: Cardiovascular Disease | Admitting: Cardiovascular Disease

## 2012-09-26 ENCOUNTER — Encounter (HOSPITAL_COMMUNITY)
Admission: RE | Admit: 2012-09-26 | Discharge: 2012-09-26 | Disposition: A | Discharge status: Home / Self Care | Payer: Self-pay | Source: Ambulatory Visit | Attending: Cardiovascular Disease | Admitting: Cardiovascular Disease

## 2012-09-29 ENCOUNTER — Encounter (HOSPITAL_COMMUNITY)
Admission: RE | Admit: 2012-09-29 | Discharge: 2012-09-29 | Disposition: A | Discharge status: Home / Self Care | Payer: Self-pay | Source: Ambulatory Visit | Attending: Cardiovascular Disease | Admitting: Cardiovascular Disease

## 2012-10-01 ENCOUNTER — Encounter (HOSPITAL_COMMUNITY)
Admission: RE | Admit: 2012-10-01 | Discharge: 2012-10-01 | Disposition: A | Discharge status: Home / Self Care | Payer: Self-pay | Source: Ambulatory Visit | Attending: Cardiovascular Disease | Admitting: Cardiovascular Disease

## 2012-10-03 ENCOUNTER — Encounter (HOSPITAL_COMMUNITY)
Admission: RE | Admit: 2012-10-03 | Discharge: 2012-10-03 | Disposition: A | Discharge status: Home / Self Care | Payer: Self-pay | Source: Ambulatory Visit | Attending: Cardiovascular Disease | Admitting: Cardiovascular Disease

## 2012-10-06 ENCOUNTER — Encounter (HOSPITAL_COMMUNITY)
Admission: RE | Admit: 2012-10-06 | Discharge: 2012-10-06 | Disposition: A | Discharge status: Home / Self Care | Payer: Self-pay | Source: Ambulatory Visit | Attending: Cardiovascular Disease | Admitting: Cardiovascular Disease

## 2012-10-08 ENCOUNTER — Encounter (HOSPITAL_COMMUNITY)
Admission: RE | Admit: 2012-10-08 | Discharge: 2012-10-08 | Disposition: A | Discharge status: Home / Self Care | Payer: Self-pay | Source: Ambulatory Visit | Attending: Cardiovascular Disease | Admitting: Cardiovascular Disease

## 2012-10-10 ENCOUNTER — Encounter (HOSPITAL_COMMUNITY)
Admission: RE | Admit: 2012-10-10 | Discharge: 2012-10-10 | Disposition: A | Discharge status: Home / Self Care | Payer: Self-pay | Source: Ambulatory Visit | Attending: Cardiovascular Disease | Admitting: Cardiovascular Disease

## 2012-10-13 ENCOUNTER — Encounter (HOSPITAL_COMMUNITY)
Admission: RE | Admit: 2012-10-13 | Discharge: 2012-10-13 | Disposition: A | Discharge status: Home / Self Care | Payer: Self-pay | Source: Ambulatory Visit | Attending: Cardiovascular Disease | Admitting: Cardiovascular Disease

## 2012-10-15 ENCOUNTER — Encounter (HOSPITAL_COMMUNITY): Payer: Self-pay

## 2012-10-17 ENCOUNTER — Encounter (HOSPITAL_COMMUNITY)
Admission: RE | Admit: 2012-10-17 | Discharge: 2012-10-17 | Disposition: A | Discharge status: Home / Self Care | Payer: Self-pay | Source: Ambulatory Visit | Attending: Cardiovascular Disease | Admitting: Cardiovascular Disease

## 2012-10-22 ENCOUNTER — Encounter (HOSPITAL_COMMUNITY)
Admission: RE | Admit: 2012-10-22 | Discharge: 2012-10-22 | Disposition: A | Discharge status: Home / Self Care | Payer: Self-pay | Source: Ambulatory Visit | Attending: Cardiovascular Disease | Admitting: Cardiovascular Disease

## 2012-10-22 DIAGNOSIS — I2510 Atherosclerotic heart disease of native coronary artery without angina pectoris: Secondary | ICD-10-CM | POA: Insufficient documentation

## 2012-10-22 DIAGNOSIS — Z9861 Coronary angioplasty status: Secondary | ICD-10-CM | POA: Insufficient documentation

## 2012-10-22 DIAGNOSIS — I10 Essential (primary) hypertension: Secondary | ICD-10-CM | POA: Insufficient documentation

## 2012-10-22 DIAGNOSIS — E785 Hyperlipidemia, unspecified: Secondary | ICD-10-CM | POA: Insufficient documentation

## 2012-10-22 DIAGNOSIS — Z5189 Encounter for other specified aftercare: Secondary | ICD-10-CM | POA: Insufficient documentation

## 2012-10-24 ENCOUNTER — Encounter (HOSPITAL_COMMUNITY)
Admission: RE | Admit: 2012-10-24 | Discharge: 2012-10-24 | Disposition: A | Discharge status: Home / Self Care | Payer: Self-pay | Source: Ambulatory Visit | Attending: Cardiovascular Disease | Admitting: Cardiovascular Disease

## 2012-10-27 ENCOUNTER — Encounter (HOSPITAL_COMMUNITY)
Admission: RE | Admit: 2012-10-27 | Discharge: 2012-10-27 | Disposition: A | Discharge status: Home / Self Care | Payer: Self-pay | Source: Ambulatory Visit | Attending: Cardiovascular Disease | Admitting: Cardiovascular Disease

## 2012-10-29 ENCOUNTER — Encounter (HOSPITAL_COMMUNITY)
Admission: RE | Admit: 2012-10-29 | Discharge: 2012-10-29 | Disposition: A | Discharge status: Home / Self Care | Payer: Self-pay | Source: Ambulatory Visit | Attending: Cardiovascular Disease | Admitting: Cardiovascular Disease

## 2012-10-31 ENCOUNTER — Encounter (HOSPITAL_COMMUNITY): Payer: PRIVATE HEALTH INSURANCE

## 2012-11-03 ENCOUNTER — Encounter (HOSPITAL_COMMUNITY)
Admission: RE | Admit: 2012-11-03 | Discharge: 2012-11-03 | Disposition: A | Discharge status: Home / Self Care | Payer: Self-pay | Source: Ambulatory Visit | Attending: Cardiovascular Disease | Admitting: Cardiovascular Disease

## 2012-11-05 ENCOUNTER — Encounter (HOSPITAL_COMMUNITY)
Admission: RE | Admit: 2012-11-05 | Discharge: 2012-11-05 | Disposition: A | Discharge status: Home / Self Care | Payer: 59 | Source: Ambulatory Visit | Attending: Cardiovascular Disease | Admitting: Cardiovascular Disease

## 2012-11-07 ENCOUNTER — Encounter (HOSPITAL_COMMUNITY)
Admission: RE | Admit: 2012-11-07 | Discharge: 2012-11-07 | Disposition: A | Discharge status: Home / Self Care | Payer: Self-pay | Source: Ambulatory Visit | Attending: Cardiovascular Disease | Admitting: Cardiovascular Disease

## 2012-11-10 ENCOUNTER — Encounter (HOSPITAL_COMMUNITY)
Admission: RE | Admit: 2012-11-10 | Discharge: 2012-11-10 | Disposition: A | Discharge status: Home / Self Care | Payer: Self-pay | Source: Ambulatory Visit | Attending: Cardiovascular Disease | Admitting: Cardiovascular Disease

## 2012-11-12 ENCOUNTER — Other Ambulatory Visit: Payer: Self-pay | Admitting: *Deleted

## 2012-11-12 ENCOUNTER — Encounter (HOSPITAL_COMMUNITY)
Admission: RE | Admit: 2012-11-12 | Discharge: 2012-11-12 | Disposition: A | Discharge status: Home / Self Care | Payer: Self-pay | Source: Ambulatory Visit | Attending: Cardiovascular Disease | Admitting: Cardiovascular Disease

## 2012-11-12 MED ORDER — NITROGLYCERIN 0.4 MG SL SUBL: 0.4000 mg | SUBLINGUAL_TABLET | SUBLINGUAL | Status: DC | PRN

## 2012-11-14 ENCOUNTER — Encounter (HOSPITAL_COMMUNITY): Payer: PRIVATE HEALTH INSURANCE

## 2012-11-17 ENCOUNTER — Encounter (HOSPITAL_COMMUNITY)
Admission: RE | Admit: 2012-11-17 | Discharge: 2012-11-17 | Disposition: A | Discharge status: Home / Self Care | Payer: Self-pay | Source: Ambulatory Visit | Attending: Cardiovascular Disease | Admitting: Cardiovascular Disease

## 2012-11-19 ENCOUNTER — Encounter (HOSPITAL_COMMUNITY): Payer: PRIVATE HEALTH INSURANCE

## 2012-11-19 DIAGNOSIS — I2510 Atherosclerotic heart disease of native coronary artery without angina pectoris: Secondary | ICD-10-CM | POA: Insufficient documentation

## 2012-11-19 DIAGNOSIS — Z5189 Encounter for other specified aftercare: Secondary | ICD-10-CM | POA: Insufficient documentation

## 2012-11-19 DIAGNOSIS — I10 Essential (primary) hypertension: Secondary | ICD-10-CM | POA: Insufficient documentation

## 2012-11-19 DIAGNOSIS — E785 Hyperlipidemia, unspecified: Secondary | ICD-10-CM | POA: Insufficient documentation

## 2012-11-19 DIAGNOSIS — Z9861 Coronary angioplasty status: Secondary | ICD-10-CM | POA: Insufficient documentation

## 2012-11-21 ENCOUNTER — Encounter (HOSPITAL_COMMUNITY): Payer: PRIVATE HEALTH INSURANCE

## 2012-11-24 ENCOUNTER — Encounter (HOSPITAL_COMMUNITY)
Admission: RE | Admit: 2012-11-24 | Discharge: 2012-11-24 | Disposition: A | Discharge status: Home / Self Care | Payer: Self-pay | Source: Ambulatory Visit | Attending: Cardiovascular Disease | Admitting: Cardiovascular Disease

## 2012-11-26 ENCOUNTER — Encounter (HOSPITAL_COMMUNITY): Payer: PRIVATE HEALTH INSURANCE

## 2012-11-28 ENCOUNTER — Encounter (HOSPITAL_COMMUNITY)
Admission: RE | Admit: 2012-11-28 | Discharge: 2012-11-28 | Disposition: A | Discharge status: Home / Self Care | Payer: Self-pay | Source: Ambulatory Visit | Attending: Cardiovascular Disease | Admitting: Cardiovascular Disease

## 2012-12-01 ENCOUNTER — Encounter (HOSPITAL_COMMUNITY)
Admission: RE | Admit: 2012-12-01 | Discharge: 2012-12-01 | Disposition: A | Discharge status: Home / Self Care | Payer: Self-pay | Source: Ambulatory Visit | Attending: Cardiovascular Disease | Admitting: Cardiovascular Disease

## 2012-12-03 ENCOUNTER — Encounter (HOSPITAL_COMMUNITY)
Admission: RE | Admit: 2012-12-03 | Discharge: 2012-12-03 | Disposition: A | Discharge status: Home / Self Care | Payer: Self-pay | Source: Ambulatory Visit | Attending: Cardiovascular Disease | Admitting: Cardiovascular Disease

## 2012-12-05 ENCOUNTER — Encounter (HOSPITAL_COMMUNITY)
Admission: RE | Admit: 2012-12-05 | Discharge: 2012-12-05 | Disposition: A | Discharge status: Home / Self Care | Payer: Self-pay | Source: Ambulatory Visit | Attending: Cardiovascular Disease | Admitting: Cardiovascular Disease

## 2012-12-08 ENCOUNTER — Encounter (HOSPITAL_COMMUNITY)
Admission: RE | Admit: 2012-12-08 | Discharge: 2012-12-08 | Disposition: A | Discharge status: Home / Self Care | Payer: Self-pay | Source: Ambulatory Visit | Attending: Cardiovascular Disease | Admitting: Cardiovascular Disease

## 2012-12-10 ENCOUNTER — Encounter (HOSPITAL_COMMUNITY): Payer: PRIVATE HEALTH INSURANCE

## 2012-12-12 ENCOUNTER — Encounter (HOSPITAL_COMMUNITY)
Admission: RE | Admit: 2012-12-12 | Discharge: 2012-12-12 | Disposition: A | Discharge status: Home / Self Care | Payer: Self-pay | Source: Ambulatory Visit | Attending: Cardiovascular Disease | Admitting: Cardiovascular Disease

## 2012-12-15 ENCOUNTER — Encounter (HOSPITAL_COMMUNITY)
Admission: RE | Admit: 2012-12-15 | Discharge: 2012-12-15 | Disposition: A | Discharge status: Home / Self Care | Payer: Self-pay | Source: Ambulatory Visit | Attending: Cardiovascular Disease | Admitting: Cardiovascular Disease

## 2012-12-17 ENCOUNTER — Encounter (HOSPITAL_COMMUNITY)
Admission: RE | Admit: 2012-12-17 | Discharge: 2012-12-17 | Disposition: A | Discharge status: Home / Self Care | Payer: Self-pay | Source: Ambulatory Visit | Attending: Cardiovascular Disease | Admitting: Cardiovascular Disease

## 2012-12-19 ENCOUNTER — Encounter (HOSPITAL_COMMUNITY): Payer: PRIVATE HEALTH INSURANCE

## 2012-12-22 ENCOUNTER — Encounter (HOSPITAL_COMMUNITY)
Admission: RE | Admit: 2012-12-22 | Discharge: 2012-12-22 | Disposition: A | Discharge status: Home / Self Care | Payer: Self-pay | Source: Ambulatory Visit | Attending: Cardiovascular Disease | Admitting: Cardiovascular Disease

## 2012-12-22 DIAGNOSIS — I10 Essential (primary) hypertension: Secondary | ICD-10-CM | POA: Insufficient documentation

## 2012-12-22 DIAGNOSIS — E785 Hyperlipidemia, unspecified: Secondary | ICD-10-CM | POA: Insufficient documentation

## 2012-12-22 DIAGNOSIS — Z9861 Coronary angioplasty status: Secondary | ICD-10-CM | POA: Insufficient documentation

## 2012-12-22 DIAGNOSIS — Z5189 Encounter for other specified aftercare: Secondary | ICD-10-CM | POA: Insufficient documentation

## 2012-12-22 DIAGNOSIS — I2510 Atherosclerotic heart disease of native coronary artery without angina pectoris: Secondary | ICD-10-CM | POA: Insufficient documentation

## 2012-12-24 ENCOUNTER — Telehealth: Payer: Self-pay | Admitting: *Deleted

## 2012-12-24 ENCOUNTER — Encounter (HOSPITAL_COMMUNITY): Payer: PRIVATE HEALTH INSURANCE

## 2012-12-24 DIAGNOSIS — E291 Testicular hypofunction: Secondary | ICD-10-CM

## 2012-12-24 MED ORDER — TESTOSTERONE CYPIONATE 200 MG/ML IM SOLN: 400.0000 mg | INTRAMUSCULAR | Status: DC

## 2012-12-24 NOTE — Telephone Encounter (Signed)
rx to pharmacy 

## 2012-12-26 ENCOUNTER — Encounter (HOSPITAL_COMMUNITY)
Admission: RE | Admit: 2012-12-26 | Discharge: 2012-12-26 | Disposition: A | Discharge status: Home / Self Care | Payer: Self-pay | Source: Ambulatory Visit | Attending: Cardiovascular Disease | Admitting: Cardiovascular Disease

## 2012-12-28 ENCOUNTER — Other Ambulatory Visit: Payer: Self-pay | Admitting: Internal Medicine

## 2012-12-29 ENCOUNTER — Encounter (HOSPITAL_COMMUNITY)
Admission: RE | Admit: 2012-12-29 | Discharge: 2012-12-29 | Disposition: A | Discharge status: Home / Self Care | Payer: Self-pay | Source: Ambulatory Visit | Attending: Cardiovascular Disease | Admitting: Cardiovascular Disease

## 2012-12-31 ENCOUNTER — Encounter (HOSPITAL_COMMUNITY): Payer: PRIVATE HEALTH INSURANCE

## 2013-01-01 ENCOUNTER — Other Ambulatory Visit: Payer: Self-pay | Admitting: Internal Medicine

## 2013-01-01 ENCOUNTER — Ambulatory Visit (INDEPENDENT_AMBULATORY_CARE_PROVIDER_SITE_OTHER): Payer: 59 | Admitting: Cardiovascular Disease

## 2013-01-01 ENCOUNTER — Encounter: Payer: Self-pay | Admitting: Cardiovascular Disease

## 2013-01-01 VITALS — BP 110/82 | HR 58 | Ht 68.5 in | Wt 202.0 lb

## 2013-01-01 DIAGNOSIS — I10 Essential (primary) hypertension: Secondary | ICD-10-CM

## 2013-01-01 DIAGNOSIS — E785 Hyperlipidemia, unspecified: Secondary | ICD-10-CM

## 2013-01-01 DIAGNOSIS — I2510 Atherosclerotic heart disease of native coronary artery without angina pectoris: Secondary | ICD-10-CM

## 2013-01-01 NOTE — Assessment & Plan Note (Signed)
On statin therapy followed by his PCP 

## 2013-01-01 NOTE — Assessment & Plan Note (Signed)
Well-controlled on current medications 

## 2013-01-01 NOTE — Assessment & Plan Note (Signed)
Status post mid LAD stenting by myself 01/03/11 with a resolute drug eluting stent. The remainder of his anatomy was unremarkable. He had normal LV function. He's had no recurrent chest pain.

## 2013-01-01 NOTE — Patient Instructions (Signed)
Your physician wants you to follow-up in: 6 months with Laura Ingold NP and 1 year with Dr Berry.  You will receive a reminder letter in the mail two months in advance. If you don't receive a letter, please call our office to schedule the follow-up appointment.  

## 2013-01-01 NOTE — Progress Notes (Signed)
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 Fredirick Carter. I saw him 6 months ago. His risk factors include hypertension, hyperlipidemia and family history. His mother died of an MI at age 8 and father had CABG at age 70. He had a stress test in our office September 15, 2007 that showed mild anterolateral ischemia. He is having knee arthroscopy as an outpatient by Dr. Hadassah Carter who noted 30 seconds of ventricular tachycardia. He was transferred to [facility] and I cathed him radially January 03, 2011 revealing a 70% mid LAD lesion which was found to be physiologically significant by FFR. I stented him with a Resolute drug-eluting stent. He has had no recurrent symptoms. He participates in cardiac rehab, phase 3 still and finds it helpful. His most recent lab work performed October 25, 2011 revealed a total cholesterol of 175, LDL of 95, HDL of 61.    Current Outpatient Prescriptions  Medication Sig Dispense Refill  . ALPRAZolam (XANAX) 1 MG tablet Take 1 mg by mouth 3 (three) times daily as needed. For anxiety      . atenolol (TENORMIN) 100 MG tablet Take 100 mg by mouth daily.       Marland Kitchen atorvastatin (LIPITOR) 40 MG tablet Take 1 tablet (40 mg total) by mouth daily.  30 tablet  11  . buPROPion (WELLBUTRIN XL) 300 MG 24 hr tablet Take 300 mg by mouth daily.       . clopidogrel (PLAVIX) 75 MG tablet Take 75 mg by mouth daily.      Marland Kitchen diltiazem (TIAZAC) 240 MG 24 hr capsule Take 240 mg by mouth daily.      . fish oil-omega-3 fatty acids 1000 MG capsule Take 2 g by mouth daily.       . furosemide (LASIX) 80 MG tablet Take 80 mg by mouth daily.        . Multiple Vitamin (MULTIVITAMIN WITH MINERALS) TABS Take 2 tablets by mouth  daily.       . nitroGLYCERIN (NITROSTAT) 0.4 MG SL tablet Place 1 tablet (0.4 mg total) under the tongue every 5 (five) minutes as needed for chest pain.  25 tablet  3  . olmesartan (BENICAR) 40 MG tablet Take 40 mg by mouth daily.        Marland Kitchen oxyCODONE (OXY IR/ROXICODONE) 5 MG immediate release tablet 1-2 tablets every 4-6 hrs as needed for pain  40 tablet  0  . pantoprazole (PROTONIX) 40 MG tablet Take 1 tablet (40 mg total) by mouth daily.  90 tablet  3  . testosterone cypionate (DEPOTESTOTERONE CYPIONATE) 200 MG/ML injection INJECT 2 MILLILITERS INTO THE MUSCLE EVERY 2 WEEKS OR AS DIRECTED  10 mL  2   No current facility-administered medications for this visit.    Allergies  Allergen Reactions  . Tape Rash    Clear tape    History   Social History  . Marital Status: Married    Spouse Name: N/A    Number of Children: 2  . Years of Education: N/A   Occupational History  . Self Employed     Agricultural engineer  .     Social History Main Topics  . Smoking status: Never Smoker   . Smokeless tobacco: Never  Used  . Alcohol Use: 6.0 oz/week    10 Cans of beer per week     Comment: 1-2 beers a night  . Drug Use: No  . Sexual Activity: Not on file   Other Topics Concern  . Not on file   Social History Narrative  . No narrative on file     Review of Systems: General: negative for chills, fever, night sweats or weight changes.  Cardiovascular: negative for chest pain, dyspnea on exertion, edema, orthopnea, palpitations, paroxysmal nocturnal dyspnea or shortness of breath Dermatological: negative for rash Respiratory: negative for cough or wheezing Urologic: negative for hematuria Abdominal: negative for nausea, vomiting, diarrhea, bright red blood per rectum, melena, or hematemesis Neurologic: negative for visual changes, syncope, or dizziness All other systems reviewed and are otherwise negative except as noted above.    Blood pressure 110/82, pulse 58, height 5' 8.5"  (1.74 m), weight 202 lb (91.627 kg).  General appearance: alert and no distress Neck: no adenopathy, no carotid bruit, no JVD, supple, symmetrical, trachea midline and thyroid not enlarged, symmetric, no tenderness/mass/nodules Lungs: clear to auscultation bilaterally Heart: regular rate and rhythm, S1, S2 normal, no murmur, click, rub or gallop Extremities: extremities normal, atraumatic, no cyanosis or edema  EKG sinus bradycardia at 58 with nonspecific ST and T wave changes  ASSESSMENT AND PLAN:   CAD (coronary artery disease), native coronary artery Status post mid LAD stenting by myself 01/03/11 with a resolute drug eluting stent. The remainder of his anatomy was unremarkable. He had normal LV function. He's had no recurrent chest pain.  Hypertension Well-controlled on current medications  Hyperlipidemia On statin therapy followed by his PCP      Marvin Gess MD [facility], [facility] 01/01/2013 12:19 PM

## 2013-01-02 ENCOUNTER — Encounter (HOSPITAL_COMMUNITY)
Admission: RE | Admit: 2013-01-02 | Discharge: 2013-01-02 | Disposition: A | Discharge status: Home / Self Care | Payer: Self-pay | Source: Ambulatory Visit | Attending: Cardiovascular Disease | Admitting: Cardiovascular Disease

## 2013-01-05 ENCOUNTER — Encounter (HOSPITAL_COMMUNITY)
Admission: RE | Admit: 2013-01-05 | Discharge: 2013-01-05 | Disposition: A | Discharge status: Home / Self Care | Payer: Self-pay | Source: Ambulatory Visit | Attending: Cardiovascular Disease | Admitting: Cardiovascular Disease

## 2013-01-07 ENCOUNTER — Encounter (HOSPITAL_COMMUNITY): Payer: PRIVATE HEALTH INSURANCE

## 2013-01-09 ENCOUNTER — Encounter (HOSPITAL_COMMUNITY)
Admission: RE | Admit: 2013-01-09 | Discharge: 2013-01-09 | Disposition: A | Discharge status: Home / Self Care | Payer: Self-pay | Source: Ambulatory Visit | Attending: Cardiovascular Disease | Admitting: Cardiovascular Disease

## 2013-01-12 ENCOUNTER — Encounter (HOSPITAL_COMMUNITY): Payer: PRIVATE HEALTH INSURANCE

## 2013-01-14 ENCOUNTER — Encounter (HOSPITAL_COMMUNITY)
Admission: RE | Admit: 2013-01-14 | Discharge: 2013-01-14 | Disposition: A | Discharge status: Home / Self Care | Payer: Self-pay | Source: Ambulatory Visit | Attending: Cardiovascular Disease | Admitting: Cardiovascular Disease

## 2013-01-19 ENCOUNTER — Encounter (HOSPITAL_COMMUNITY): Payer: PRIVATE HEALTH INSURANCE

## 2013-01-19 DIAGNOSIS — E785 Hyperlipidemia, unspecified: Secondary | ICD-10-CM | POA: Insufficient documentation

## 2013-01-19 DIAGNOSIS — Z9861 Coronary angioplasty status: Secondary | ICD-10-CM | POA: Insufficient documentation

## 2013-01-19 DIAGNOSIS — I2510 Atherosclerotic heart disease of native coronary artery without angina pectoris: Secondary | ICD-10-CM | POA: Insufficient documentation

## 2013-01-19 DIAGNOSIS — Z5189 Encounter for other specified aftercare: Secondary | ICD-10-CM | POA: Insufficient documentation

## 2013-01-19 DIAGNOSIS — I10 Essential (primary) hypertension: Secondary | ICD-10-CM | POA: Insufficient documentation

## 2013-01-21 ENCOUNTER — Encounter (HOSPITAL_COMMUNITY)
Admission: RE | Admit: 2013-01-21 | Discharge: 2013-01-21 | Disposition: A | Discharge status: Home / Self Care | Payer: Self-pay | Source: Ambulatory Visit | Attending: Cardiovascular Disease | Admitting: Cardiovascular Disease

## 2013-01-23 ENCOUNTER — Encounter (HOSPITAL_COMMUNITY)
Admission: RE | Admit: 2013-01-23 | Discharge: 2013-01-23 | Disposition: A | Discharge status: Home / Self Care | Payer: Self-pay | Source: Ambulatory Visit | Attending: Cardiovascular Disease | Admitting: Cardiovascular Disease

## 2013-01-26 ENCOUNTER — Encounter (HOSPITAL_COMMUNITY)
Admission: RE | Admit: 2013-01-26 | Discharge: 2013-01-26 | Disposition: A | Discharge status: Home / Self Care | Payer: Self-pay | Source: Ambulatory Visit | Attending: Cardiovascular Disease | Admitting: Cardiovascular Disease

## 2013-01-27 ENCOUNTER — Encounter: Payer: Self-pay | Admitting: Internal Medicine

## 2013-01-28 ENCOUNTER — Encounter (HOSPITAL_COMMUNITY)
Admission: RE | Admit: 2013-01-28 | Discharge: 2013-01-28 | Disposition: A | Discharge status: Home / Self Care | Payer: Self-pay | Source: Ambulatory Visit | Attending: Cardiovascular Disease | Admitting: Cardiovascular Disease

## 2013-01-29 ENCOUNTER — Ambulatory Visit (INDEPENDENT_AMBULATORY_CARE_PROVIDER_SITE_OTHER): Payer: PRIVATE HEALTH INSURANCE | Admitting: Emergency Medicine

## 2013-01-29 ENCOUNTER — Encounter: Payer: Self-pay | Admitting: Emergency Medicine

## 2013-01-29 VITALS — BP 130/88 | HR 58 | Temp 98.2°F | Resp 18 | Ht 68.0 in | Wt 200.0 lb

## 2013-01-29 DIAGNOSIS — E291 Testicular hypofunction: Secondary | ICD-10-CM

## 2013-01-29 DIAGNOSIS — I10 Essential (primary) hypertension: Secondary | ICD-10-CM

## 2013-01-29 DIAGNOSIS — E782 Mixed hyperlipidemia: Secondary | ICD-10-CM

## 2013-01-29 DIAGNOSIS — R7309 Other abnormal glucose: Secondary | ICD-10-CM

## 2013-01-29 LAB — HEPATIC FUNCTION PANEL
ALT: 54 U/L — ABNORMAL HIGH (ref 0–53)
AST: 45 U/L — ABNORMAL HIGH (ref 0–37)
Bilirubin, Direct: 0.2 mg/dL (ref 0.0–0.3)
Indirect Bilirubin: 0.6 mg/dL (ref 0.0–0.9)

## 2013-01-29 LAB — BASIC METABOLIC PANEL WITH GFR
Chloride: 98 mEq/L (ref 96–112)
Creat: 1.13 mg/dL (ref 0.50–1.35)
GFR, Est African American: 82 mL/min
GFR, Est Non African American: 71 mL/min
Potassium: 4 mEq/L (ref 3.5–5.3)

## 2013-01-29 LAB — HEMOGLOBIN A1C
Hgb A1c MFr Bld: 5.4 % (ref <5.7)
Mean Plasma Glucose: 108 mg/dL (ref <117)

## 2013-01-29 LAB — CBC WITH DIFFERENTIAL/PLATELET
Basophils Relative: 0 % (ref 0–1)
Eosinophils Absolute: 0.1 10*3/uL (ref 0.0–0.7)
HCT: 48.5 % (ref 39.0–52.0)
Lymphs Abs: 2 10*3/uL (ref 0.7–4.0)
MCH: 33.7 pg (ref 26.0–34.0)
Monocytes Absolute: 0.8 10*3/uL (ref 0.1–1.0)
Monocytes Relative: 12 % (ref 3–12)
Neutro Abs: 3.6 10*3/uL (ref 1.7–7.7)
Neutrophils Relative %: 57 % (ref 43–77)
Platelets: 200 10*3/uL (ref 150–400)
RBC: 4.92 MIL/uL (ref 4.22–5.81)
WBC: 6.4 10*3/uL (ref 4.0–10.5)

## 2013-01-29 LAB — LIPID PANEL
LDL Cholesterol: 95 mg/dL (ref 0–99)
Triglycerides: 183 mg/dL — ABNORMAL HIGH (ref <150)
VLDL: 37 mg/dL (ref 0–40)

## 2013-01-29 MED ORDER — TESTOSTERONE CYPIONATE 200 MG/ML IM SOLN: INTRAMUSCULAR | Status: DC

## 2013-01-29 NOTE — Patient Instructions (Signed)
Vitamin D Deficiency  Not having enough vitamin D is called a deficiency. Your body needs this vitamin to keep your bones strong and healthy. Having too little of it can make your bones soft or can cause other health problems.  HOME CARE  Take all vitamins, herbs, or nutrition drinks (supplements) as told by your doctor.  Have your blood tested 2 months after taking vitamins, herbs, or nutrition drinks.  Eat foods that have vitamin D. This includes:  Dairy products, cereals, or juices with added vitamin D. Check the label.  Fatty fish like salmon or trout.  Eggs.  Oysters.  Do not use tanning beds.  Stay at a healthy weight. Lose weight if needed.  Keep all doctor visits as told. GET HELP IF:  You have questions.  You continue to have problems.  You feel sick to your stomach (nauseous) or throw up (vomit).  You cannot go poop (constipated).  You feel confused.  You have severe belly (abdominal) or back pain. MAKE SURE YOU:  Understand these instructions.  Will watch your condition.  Will get help right away if you are not doing well or get worse. Document Released: 01/25/2011 Document Revised: 06/02/2012 Document Reviewed: 01/25/2011 [employer] Patient Information 2014 [employer].  

## 2013-01-30 ENCOUNTER — Encounter (HOSPITAL_COMMUNITY)
Admission: RE | Admit: 2013-01-30 | Discharge: 2013-01-30 | Disposition: A | Discharge status: Home / Self Care | Payer: Self-pay | Source: Ambulatory Visit | Attending: Cardiovascular Disease | Admitting: Cardiovascular Disease

## 2013-01-30 ENCOUNTER — Telehealth: Payer: Self-pay | Admitting: *Deleted

## 2013-01-30 LAB — INSULIN, FASTING: Insulin fasting, serum: 12 u[IU]/mL (ref 3–28)

## 2013-01-30 NOTE — Telephone Encounter (Signed)
Message copied by Nicholaus Corolla A on Fri Jan 30, 2013  8:27 AM ------      Message from: [place], [place] R      Created: Fri Jan 30, 2013  6:15 AM       A1c back in normal range great job, but TG and liver mild elevation, try not to take any tylenol/ pain meds, Decrease beer, increase H20. Needs recheck 4 weeks lab only HFP/ CBC ------

## 2013-01-30 NOTE — Telephone Encounter (Signed)
Spoke with patient about recent lab results and instructions. 

## 2013-01-31 NOTE — Progress Notes (Signed)
Subjective:    Patient ID: Marvin Carter, male    DOB: 1955/10/27, 57 y.o.   MRN: [id number]  HPI Comments: 57 yo male presents for 3 month F/U for HTN, Cholesterol, Pre-Dm, D. Deficient with history of ASHD with stents. He has been doing well. He still attends cardiac rehab 3 x week but is also exercising with weights outside of rehab. He has maintained his weight loss. He has d/c wine and only occasionally drinks beer. His BP at home and rehab has been good.    Hypertension  Hyperlipidemia  Gastrophageal Reflux    Current Outpatient Prescriptions on File Prior to Visit  Medication Sig Dispense Refill  . ALPRAZolam (XANAX) 1 MG tablet Take 1 mg by mouth 3 (three) times daily as needed. For anxiety      . aspirin 81 MG tablet Take 81 mg by mouth daily.      Marland Kitchen atenolol (TENORMIN) 100 MG tablet Take 100 mg by mouth daily.       Marland Kitchen atorvastatin (LIPITOR) 40 MG tablet Take 1 tablet (40 mg total) by mouth daily.  30 tablet  11  . b complex vitamins tablet Take 1 tablet by mouth daily.      Marland Kitchen buPROPion (WELLBUTRIN XL) 300 MG 24 hr tablet Take 300 mg by mouth daily.       . clopidogrel (PLAVIX) 75 MG tablet Take 75 mg by mouth daily.      Marland Kitchen diltiazem (TIAZAC) 240 MG 24 hr capsule Take 240 mg by mouth daily.      . fenofibrate micronized (LOFIBRA) 134 MG capsule Take 134 mg by mouth daily.      . fish oil-omega-3 fatty acids 1000 MG capsule Take 2 g by mouth daily.       . furosemide (LASIX) 80 MG tablet Take 80 mg by mouth daily.        . Multiple Vitamin (MULTIVITAMIN WITH MINERALS) TABS Take 2 tablets by mouth daily.       Marland Kitchen olmesartan (BENICAR) 40 MG tablet Take 40 mg by mouth daily.        . pantoprazole (PROTONIX) 40 MG tablet Take 1 tablet (40 mg total) by mouth daily.  90 tablet  3   No current facility-administered medications on file prior to visit.   ALLERGIES Hctz; Ibuprofen; and Tape  Past Medical History  Diagnosis Date  . Hypertension   . Hyperlipidemia   .  Diverticulitis   . Hemorrhoids   . GERD (gastroesophageal reflux disease)   . Anxiety   . Meniscus tear 01/02/2011  . Right knee pain 01/02/2011  . Diverticulosis   . Hemarthrosis of knee, right 01/04/2011  . CAD (coronary artery disease), native coronary artery 01/04/2011  . Stented coronary artery 01/04/2011    LAD DES Resolute.  . Hyperlipemia 01/04/2011  . History of meniscectomy of right knee 01/02/2011  . Torn medial meniscus     right knee  . NSVT (nonsustained ventricular tachycardia) 01/02/2011  . Atrial flutter, paroxysmal 01/04/2011  . Arthritis      Review of Systems  All other systems reviewed and are negative.    BP 130/88  Pulse 58  Temp(Src) 98.2 F (36.8 C) (Temporal)  Resp 18  Ht 5\' 8"  (1.727 m)  Wt 200 lb (90.719 kg)  BMI 30.42 kg/m2     Objective:   Physical Exam  Nursing note and vitals reviewed. Constitutional: He is oriented to person, place, and time. He appears well-developed and well-nourished.  HENT:  Head: Normocephalic and atraumatic.  Right Ear: External ear normal.  Left Ear: External ear normal.  Nose: Nose normal.  Eyes: Conjunctivae and EOM are normal.  Neck: Normal range of motion. Neck supple. No JVD present. No thyromegaly present.  Cardiovascular: Normal rate, regular rhythm, normal heart sounds and intact distal pulses.   Pulmonary/Chest: Effort normal and breath sounds normal.  Abdominal: Soft. Bowel sounds are normal. He exhibits no distension and no mass. There is no tenderness. There is no rebound and no guarding.  Musculoskeletal: Normal range of motion. He exhibits no edema and no tenderness.  Lymphadenopathy:    He has no cervical adenopathy.  Neurological: He is alert and oriented to person, place, and time. He has normal reflexes. No cranial nerve deficit. Coordination normal.  Skin: Skin is warm and dry.  Psychiatric: He has a normal mood and affect. His behavior is normal. Judgment and thought content normal.           Assessment & Plan:  1.  3 month F/U for HTN, Cholesterol, Pre-Dm, D. Deficient. Needs healthy diet, cardio QD and obtain healthy weight. Check Labs, Check BP if >130/80 call office 2. Hypogonadism- Refill RX with lab WNL at last OV

## 2013-02-02 ENCOUNTER — Encounter (HOSPITAL_COMMUNITY): Payer: PRIVATE HEALTH INSURANCE

## 2013-02-04 ENCOUNTER — Encounter (HOSPITAL_COMMUNITY)
Admission: RE | Admit: 2013-02-04 | Discharge: 2013-02-04 | Disposition: A | Discharge status: Home / Self Care | Payer: Self-pay | Source: Ambulatory Visit | Attending: Cardiovascular Disease | Admitting: Cardiovascular Disease

## 2013-02-06 ENCOUNTER — Encounter (HOSPITAL_COMMUNITY)
Admission: RE | Admit: 2013-02-06 | Discharge: 2013-02-06 | Disposition: A | Discharge status: Home / Self Care | Payer: Self-pay | Source: Ambulatory Visit | Attending: Cardiovascular Disease | Admitting: Cardiovascular Disease

## 2013-02-09 ENCOUNTER — Other Ambulatory Visit: Payer: Self-pay | Admitting: Internal Medicine

## 2013-02-09 ENCOUNTER — Encounter (HOSPITAL_COMMUNITY): Payer: PRIVATE HEALTH INSURANCE

## 2013-02-11 ENCOUNTER — Encounter (HOSPITAL_COMMUNITY)
Admission: RE | Admit: 2013-02-11 | Discharge: 2013-02-11 | Disposition: A | Discharge status: Home / Self Care | Payer: Self-pay | Source: Ambulatory Visit | Attending: Cardiovascular Disease | Admitting: Cardiovascular Disease

## 2013-02-16 ENCOUNTER — Encounter (HOSPITAL_COMMUNITY)
Admission: RE | Admit: 2013-02-16 | Discharge: 2013-02-16 | Disposition: A | Discharge status: Home / Self Care | Payer: Self-pay | Source: Ambulatory Visit | Attending: Cardiovascular Disease | Admitting: Cardiovascular Disease

## 2013-02-18 ENCOUNTER — Encounter (HOSPITAL_COMMUNITY)
Admission: RE | Admit: 2013-02-18 | Discharge: 2013-02-18 | Disposition: A | Discharge status: Home / Self Care | Payer: Self-pay | Source: Ambulatory Visit | Attending: Cardiovascular Disease | Admitting: Cardiovascular Disease

## 2013-02-20 ENCOUNTER — Encounter (HOSPITAL_COMMUNITY): Payer: PRIVATE HEALTH INSURANCE

## 2013-02-23 ENCOUNTER — Encounter (HOSPITAL_COMMUNITY)
Admission: RE | Admit: 2013-02-23 | Discharge: 2013-02-23 | Disposition: A | Discharge status: Home / Self Care | Payer: Self-pay | Source: Ambulatory Visit | Attending: Cardiovascular Disease | Admitting: Cardiovascular Disease

## 2013-02-23 DIAGNOSIS — E785 Hyperlipidemia, unspecified: Secondary | ICD-10-CM | POA: Insufficient documentation

## 2013-02-23 DIAGNOSIS — Z5189 Encounter for other specified aftercare: Secondary | ICD-10-CM | POA: Insufficient documentation

## 2013-02-23 DIAGNOSIS — Z9861 Coronary angioplasty status: Secondary | ICD-10-CM | POA: Insufficient documentation

## 2013-02-23 DIAGNOSIS — I10 Essential (primary) hypertension: Secondary | ICD-10-CM | POA: Insufficient documentation

## 2013-02-23 DIAGNOSIS — I2510 Atherosclerotic heart disease of native coronary artery without angina pectoris: Secondary | ICD-10-CM | POA: Insufficient documentation

## 2013-02-25 ENCOUNTER — Encounter (HOSPITAL_COMMUNITY)
Admission: RE | Admit: 2013-02-25 | Discharge: 2013-02-25 | Disposition: A | Discharge status: Home / Self Care | Payer: Self-pay | Source: Ambulatory Visit | Attending: Cardiovascular Disease | Admitting: Cardiovascular Disease

## 2013-02-26 ENCOUNTER — Other Ambulatory Visit: Payer: Self-pay | Admitting: Emergency Medicine

## 2013-02-26 DIAGNOSIS — R899 Unspecified abnormal finding in specimens from other organs, systems and tissues: Secondary | ICD-10-CM

## 2013-02-27 ENCOUNTER — Encounter (HOSPITAL_COMMUNITY): Payer: PRIVATE HEALTH INSURANCE

## 2013-03-02 ENCOUNTER — Encounter (HOSPITAL_COMMUNITY)
Admission: RE | Admit: 2013-03-02 | Discharge: 2013-03-02 | Disposition: A | Discharge status: Home / Self Care | Payer: Self-pay | Source: Ambulatory Visit | Attending: Cardiovascular Disease | Admitting: Cardiovascular Disease

## 2013-03-02 ENCOUNTER — Other Ambulatory Visit: Payer: PRIVATE HEALTH INSURANCE

## 2013-03-02 DIAGNOSIS — R899 Unspecified abnormal finding in specimens from other organs, systems and tissues: Secondary | ICD-10-CM

## 2013-03-02 LAB — CBC WITH DIFFERENTIAL/PLATELET
BASOS ABS: 0 10*3/uL (ref 0.0–0.1)
Basophils Relative: 0 % (ref 0–1)
EOS ABS: 0.2 10*3/uL (ref 0.0–0.7)
EOS PCT: 3 % (ref 0–5)
HCT: 46.2 % (ref 39.0–52.0)
Hemoglobin: 16 g/dL (ref 13.0–17.0)
LYMPHS ABS: 1.6 10*3/uL (ref 0.7–4.0)
Lymphocytes Relative: 24 % (ref 12–46)
MCH: 33.8 pg (ref 26.0–34.0)
MCHC: 34.6 g/dL (ref 30.0–36.0)
MCV: 97.5 fL (ref 78.0–100.0)
Monocytes Absolute: 0.8 10*3/uL (ref 0.1–1.0)
Monocytes Relative: 13 % — ABNORMAL HIGH (ref 3–12)
NEUTROS PCT: 60 % (ref 43–77)
Neutro Abs: 4 10*3/uL (ref 1.7–7.7)
PLATELETS: 198 10*3/uL (ref 150–400)
RBC: 4.74 MIL/uL (ref 4.22–5.81)
RDW: 13.3 % (ref 11.5–15.5)
WBC: 6.6 10*3/uL (ref 4.0–10.5)

## 2013-03-02 LAB — HEPATIC FUNCTION PANEL
ALT: 31 U/L (ref 0–53)
AST: 34 U/L (ref 0–37)
Albumin: 4.6 g/dL (ref 3.5–5.2)
Alkaline Phosphatase: 41 U/L (ref 39–117)
Bilirubin, Direct: 0.1 mg/dL (ref 0.0–0.3)
Indirect Bilirubin: 0.5 mg/dL (ref 0.0–0.9)
TOTAL PROTEIN: 6.8 g/dL (ref 6.0–8.3)
Total Bilirubin: 0.6 mg/dL (ref 0.3–1.2)

## 2013-03-04 ENCOUNTER — Encounter (HOSPITAL_COMMUNITY)
Admission: RE | Admit: 2013-03-04 | Discharge: 2013-03-04 | Disposition: A | Discharge status: Home / Self Care | Payer: Self-pay | Source: Ambulatory Visit | Attending: Cardiovascular Disease | Admitting: Cardiovascular Disease

## 2013-03-06 ENCOUNTER — Encounter (HOSPITAL_COMMUNITY)
Admission: RE | Admit: 2013-03-06 | Discharge: 2013-03-06 | Disposition: A | Discharge status: Home / Self Care | Payer: Self-pay | Source: Ambulatory Visit | Attending: Cardiovascular Disease | Admitting: Cardiovascular Disease

## 2013-03-09 ENCOUNTER — Encounter (HOSPITAL_COMMUNITY)
Admission: RE | Admit: 2013-03-09 | Discharge: 2013-03-09 | Disposition: A | Discharge status: Home / Self Care | Payer: Self-pay | Source: Ambulatory Visit | Attending: Cardiovascular Disease | Admitting: Cardiovascular Disease

## 2013-03-11 ENCOUNTER — Encounter (HOSPITAL_COMMUNITY)
Admission: RE | Admit: 2013-03-11 | Discharge: 2013-03-11 | Disposition: A | Discharge status: Home / Self Care | Payer: Self-pay | Source: Ambulatory Visit | Attending: Cardiovascular Disease | Admitting: Cardiovascular Disease

## 2013-03-13 ENCOUNTER — Encounter (HOSPITAL_COMMUNITY)
Admission: RE | Admit: 2013-03-13 | Discharge: 2013-03-13 | Disposition: A | Discharge status: Home / Self Care | Payer: Self-pay | Source: Ambulatory Visit | Attending: Cardiovascular Disease | Admitting: Cardiovascular Disease

## 2013-03-16 ENCOUNTER — Encounter (HOSPITAL_COMMUNITY)
Admission: RE | Admit: 2013-03-16 | Discharge: 2013-03-16 | Disposition: A | Discharge status: Home / Self Care | Payer: Self-pay | Source: Ambulatory Visit | Attending: Cardiovascular Disease | Admitting: Cardiovascular Disease

## 2013-03-17 ENCOUNTER — Other Ambulatory Visit: Payer: Self-pay | Admitting: Internal Medicine

## 2013-03-17 DIAGNOSIS — I2510 Atherosclerotic heart disease of native coronary artery without angina pectoris: Secondary | ICD-10-CM

## 2013-03-17 DIAGNOSIS — I10 Essential (primary) hypertension: Secondary | ICD-10-CM

## 2013-03-17 MED ORDER — ATENOLOL 100 MG PO TABS: 100.0000 mg | ORAL_TABLET | Freq: Every day | ORAL | Status: DC

## 2013-03-17 MED ORDER — CLOPIDOGREL BISULFATE 75 MG PO TABS: 75.0000 mg | ORAL_TABLET | Freq: Every day | ORAL | Status: DC

## 2013-03-18 ENCOUNTER — Encounter (HOSPITAL_COMMUNITY)
Admission: RE | Admit: 2013-03-18 | Discharge: 2013-03-18 | Disposition: A | Discharge status: Home / Self Care | Payer: Self-pay | Source: Ambulatory Visit | Attending: Cardiovascular Disease | Admitting: Cardiovascular Disease

## 2013-03-20 ENCOUNTER — Encounter (HOSPITAL_COMMUNITY)
Admission: RE | Admit: 2013-03-20 | Discharge: 2013-03-20 | Disposition: A | Discharge status: Home / Self Care | Payer: Self-pay | Source: Ambulatory Visit | Attending: Cardiovascular Disease | Admitting: Cardiovascular Disease

## 2013-03-23 ENCOUNTER — Encounter (HOSPITAL_COMMUNITY)
Admission: RE | Admit: 2013-03-23 | Discharge: 2013-03-23 | Disposition: A | Discharge status: Home / Self Care | Payer: Self-pay | Source: Ambulatory Visit | Attending: Cardiovascular Disease | Admitting: Cardiovascular Disease

## 2013-03-23 DIAGNOSIS — E785 Hyperlipidemia, unspecified: Secondary | ICD-10-CM | POA: Insufficient documentation

## 2013-03-23 DIAGNOSIS — Z9861 Coronary angioplasty status: Secondary | ICD-10-CM | POA: Insufficient documentation

## 2013-03-23 DIAGNOSIS — I2510 Atherosclerotic heart disease of native coronary artery without angina pectoris: Secondary | ICD-10-CM | POA: Insufficient documentation

## 2013-03-23 DIAGNOSIS — I10 Essential (primary) hypertension: Secondary | ICD-10-CM | POA: Insufficient documentation

## 2013-03-23 DIAGNOSIS — Z5189 Encounter for other specified aftercare: Secondary | ICD-10-CM | POA: Insufficient documentation

## 2013-03-25 ENCOUNTER — Encounter (HOSPITAL_COMMUNITY)
Admission: RE | Admit: 2013-03-25 | Discharge: 2013-03-25 | Disposition: A | Discharge status: Home / Self Care | Payer: Self-pay | Source: Ambulatory Visit | Attending: Cardiovascular Disease | Admitting: Cardiovascular Disease

## 2013-03-27 ENCOUNTER — Encounter (HOSPITAL_COMMUNITY)
Admission: RE | Admit: 2013-03-27 | Discharge: 2013-03-27 | Disposition: A | Discharge status: Home / Self Care | Payer: Self-pay | Source: Ambulatory Visit | Attending: Cardiovascular Disease | Admitting: Cardiovascular Disease

## 2013-03-30 ENCOUNTER — Encounter (HOSPITAL_COMMUNITY)
Admission: RE | Admit: 2013-03-30 | Discharge: 2013-03-30 | Disposition: A | Discharge status: Home / Self Care | Payer: Self-pay | Source: Ambulatory Visit | Attending: Cardiovascular Disease | Admitting: Cardiovascular Disease

## 2013-04-01 ENCOUNTER — Encounter (HOSPITAL_COMMUNITY): Payer: PRIVATE HEALTH INSURANCE

## 2013-04-03 ENCOUNTER — Encounter (HOSPITAL_COMMUNITY)
Admission: RE | Admit: 2013-04-03 | Discharge: 2013-04-03 | Disposition: A | Discharge status: Home / Self Care | Payer: Self-pay | Source: Ambulatory Visit | Attending: Cardiovascular Disease | Admitting: Cardiovascular Disease

## 2013-04-06 ENCOUNTER — Encounter (HOSPITAL_COMMUNITY)
Admission: RE | Admit: 2013-04-06 | Discharge: 2013-04-06 | Disposition: A | Discharge status: Home / Self Care | Payer: Self-pay | Source: Ambulatory Visit | Attending: Cardiovascular Disease | Admitting: Cardiovascular Disease

## 2013-04-08 ENCOUNTER — Encounter (HOSPITAL_COMMUNITY)
Admission: RE | Admit: 2013-04-08 | Discharge: 2013-04-08 | Disposition: A | Discharge status: Home / Self Care | Payer: Self-pay | Source: Ambulatory Visit | Attending: Cardiovascular Disease | Admitting: Cardiovascular Disease

## 2013-04-10 ENCOUNTER — Encounter (HOSPITAL_COMMUNITY)
Admission: RE | Admit: 2013-04-10 | Discharge: 2013-04-10 | Disposition: A | Discharge status: Home / Self Care | Payer: Self-pay | Source: Ambulatory Visit | Attending: Cardiovascular Disease | Admitting: Cardiovascular Disease

## 2013-04-13 ENCOUNTER — Encounter (HOSPITAL_COMMUNITY)
Admission: RE | Admit: 2013-04-13 | Discharge: 2013-04-13 | Disposition: A | Discharge status: Home / Self Care | Payer: Self-pay | Source: Ambulatory Visit | Attending: Cardiovascular Disease | Admitting: Cardiovascular Disease

## 2013-04-15 ENCOUNTER — Encounter (HOSPITAL_COMMUNITY)
Admission: RE | Admit: 2013-04-15 | Discharge: 2013-04-15 | Disposition: A | Discharge status: Home / Self Care | Payer: Self-pay | Source: Ambulatory Visit | Attending: Cardiovascular Disease | Admitting: Cardiovascular Disease

## 2013-04-17 ENCOUNTER — Encounter (HOSPITAL_COMMUNITY)
Admission: RE | Admit: 2013-04-17 | Discharge: 2013-04-17 | Disposition: A | Discharge status: Home / Self Care | Payer: Self-pay | Source: Ambulatory Visit | Attending: Cardiovascular Disease | Admitting: Cardiovascular Disease

## 2013-04-18 ENCOUNTER — Other Ambulatory Visit: Payer: Self-pay | Admitting: Emergency Medicine

## 2013-04-20 ENCOUNTER — Encounter (HOSPITAL_COMMUNITY)
Admission: RE | Admit: 2013-04-20 | Discharge: 2013-04-20 | Disposition: A | Discharge status: Home / Self Care | Payer: Self-pay | Source: Ambulatory Visit | Attending: Cardiovascular Disease | Admitting: Cardiovascular Disease

## 2013-04-20 DIAGNOSIS — Z5189 Encounter for other specified aftercare: Secondary | ICD-10-CM | POA: Insufficient documentation

## 2013-04-20 DIAGNOSIS — I2510 Atherosclerotic heart disease of native coronary artery without angina pectoris: Secondary | ICD-10-CM | POA: Insufficient documentation

## 2013-04-20 DIAGNOSIS — Z9861 Coronary angioplasty status: Secondary | ICD-10-CM | POA: Insufficient documentation

## 2013-04-20 DIAGNOSIS — I10 Essential (primary) hypertension: Secondary | ICD-10-CM | POA: Insufficient documentation

## 2013-04-20 DIAGNOSIS — E785 Hyperlipidemia, unspecified: Secondary | ICD-10-CM | POA: Insufficient documentation

## 2013-04-22 ENCOUNTER — Encounter (HOSPITAL_COMMUNITY)
Admission: RE | Admit: 2013-04-22 | Discharge: 2013-04-22 | Disposition: A | Discharge status: Home / Self Care | Payer: Self-pay | Source: Ambulatory Visit | Attending: Cardiovascular Disease | Admitting: Cardiovascular Disease

## 2013-04-24 ENCOUNTER — Encounter (HOSPITAL_COMMUNITY)
Admission: RE | Admit: 2013-04-24 | Discharge: 2013-04-24 | Disposition: A | Discharge status: Home / Self Care | Payer: Self-pay | Source: Ambulatory Visit | Attending: Cardiovascular Disease | Admitting: Cardiovascular Disease

## 2013-04-27 ENCOUNTER — Encounter (HOSPITAL_COMMUNITY)
Admission: RE | Admit: 2013-04-27 | Discharge: 2013-04-27 | Disposition: A | Discharge status: Home / Self Care | Payer: Self-pay | Source: Ambulatory Visit | Attending: Cardiovascular Disease | Admitting: Cardiovascular Disease

## 2013-04-28 ENCOUNTER — Other Ambulatory Visit: Payer: Self-pay | Admitting: Emergency Medicine

## 2013-04-29 ENCOUNTER — Encounter (HOSPITAL_COMMUNITY)
Admission: RE | Admit: 2013-04-29 | Discharge: 2013-04-29 | Disposition: A | Discharge status: Home / Self Care | Payer: Self-pay | Source: Ambulatory Visit | Attending: Cardiovascular Disease | Admitting: Cardiovascular Disease

## 2013-04-30 ENCOUNTER — Ambulatory Visit (INDEPENDENT_AMBULATORY_CARE_PROVIDER_SITE_OTHER): Payer: PRIVATE HEALTH INSURANCE | Admitting: Internal Medicine

## 2013-04-30 ENCOUNTER — Encounter (INDEPENDENT_AMBULATORY_CARE_PROVIDER_SITE_OTHER): Payer: Self-pay

## 2013-04-30 ENCOUNTER — Encounter: Payer: Self-pay | Admitting: Internal Medicine

## 2013-04-30 VITALS — BP 112/74 | HR 60 | Temp 98.6°F | Resp 16 | Ht 68.0 in | Wt 203.2 lb

## 2013-04-30 DIAGNOSIS — E559 Vitamin D deficiency, unspecified: Secondary | ICD-10-CM

## 2013-04-30 DIAGNOSIS — Z79899 Other long term (current) drug therapy: Secondary | ICD-10-CM | POA: Insufficient documentation

## 2013-04-30 DIAGNOSIS — R7309 Other abnormal glucose: Secondary | ICD-10-CM

## 2013-04-30 DIAGNOSIS — E785 Hyperlipidemia, unspecified: Secondary | ICD-10-CM

## 2013-04-30 DIAGNOSIS — E349 Endocrine disorder, unspecified: Secondary | ICD-10-CM

## 2013-04-30 DIAGNOSIS — I10 Essential (primary) hypertension: Secondary | ICD-10-CM

## 2013-04-30 DIAGNOSIS — E291 Testicular hypofunction: Secondary | ICD-10-CM

## 2013-04-30 HISTORY — DX: Other abnormal glucose: R73.09

## 2013-04-30 HISTORY — DX: Vitamin D deficiency, unspecified: E55.9

## 2013-04-30 HISTORY — DX: Endocrine disorder, unspecified: E34.9

## 2013-04-30 HISTORY — DX: Other long term (current) drug therapy: Z79.899

## 2013-04-30 LAB — CBC WITH DIFFERENTIAL/PLATELET
BASOS PCT: 0 % (ref 0–1)
Basophils Absolute: 0 10*3/uL (ref 0.0–0.1)
Eosinophils Absolute: 0.2 10*3/uL (ref 0.0–0.7)
Eosinophils Relative: 3 % (ref 0–5)
HCT: 46.2 % (ref 39.0–52.0)
HEMOGLOBIN: 15.9 g/dL (ref 13.0–17.0)
LYMPHS PCT: 30 % (ref 12–46)
Lymphs Abs: 2.1 10*3/uL (ref 0.7–4.0)
MCH: 33 pg (ref 26.0–34.0)
MCHC: 34.4 g/dL (ref 30.0–36.0)
MCV: 95.9 fL (ref 78.0–100.0)
MONO ABS: 0.8 10*3/uL (ref 0.1–1.0)
MONOS PCT: 11 % (ref 3–12)
NEUTROS PCT: 56 % (ref 43–77)
Neutro Abs: 3.9 10*3/uL (ref 1.7–7.7)
Platelets: 202 10*3/uL (ref 150–400)
RBC: 4.82 MIL/uL (ref 4.22–5.81)
RDW: 13.5 % (ref 11.5–15.5)
WBC: 6.9 10*3/uL (ref 4.0–10.5)

## 2013-04-30 LAB — HEMOGLOBIN A1C
Hgb A1c MFr Bld: 5.1 % (ref <5.7)
Mean Plasma Glucose: 100 mg/dL (ref <117)

## 2013-04-30 NOTE — Progress Notes (Signed)
Patient ID: Marvin Carter, male   DOB: [birth year], 58 y.o.   MRN: [id number]    This very nice 58 y.o. MWM presents for 3 month follow up with Hypertension, Hyperlipidemia, Pre-Diabetes and Vitamin D Deficiency.    HTN predates since 1989. BP has been controlled at home. Today's BP: 112/74 mmHg . In Nov 2012, patient had V tach during an orthopedic procedure and ultimately ended up with a DES by Dr Denver Faster. Patient has done well since that time. Patient denies any cardiac type chest pain, palpitations, dyspnea/orthopnea/PND, dizziness, claudication, or dependent edema.   Hyperlipidemia is controlled with diet & meds. Last Lipids as below are at goal.  Patient denies myalgias or other med SE's.  Lab Results  Component Value Date   CHOL 189 01/29/2013   HDL 57 01/29/2013   LDLCALC 95 01/29/2013   TRIG 183* 01/29/2013   CHOLHDL 3.3 01/29/2013    Also, the patient has history of PreDiabetes with A1c 5.7% in 2013 and with last A1c of 5.4% in Dec 2014. Patient denies any symptoms of reactive hypoglycemia, diabetic polys, paresthesias or visual blurring.   Further, Patient has history of Vitamin D Deficiency of 19 in 2008with last vitamin D of 104 in Sept 2014. Patient supplements vitamin D without any suspected side-effects.  Medication Sig  . ALPRAZolam (XANAX) 1 MG tablet Take 1 mg by mouth 3 (three) times daily as needed. For anxiety  . aspirin 81 MG tablet Take 81 mg by mouth daily.  Marland Kitchen atenolol (TENORMIN) 100 MG tablet Take 1 tablet (100 mg total) by mouth daily.  Marland Kitchen atorvastatin (LIPITOR) 40 MG tablet Take 1 tablet (40 mg total) by mouth daily.  Marland Kitchen b complex vitamins tablet Take 1 tablet by mouth daily.  Marland Kitchen buPROPion (WELLBUTRIN XL) 300 MG 24 hr tablet Take 300 mg by mouth daily.   . clopidogrel (PLAVIX) 75 MG tablet Take 1 tablet (75 mg total) by mouth daily.  Marland Kitchen diltiazem (TIAZAC) 240 MG 24 hr capsule Take 240 mg by mouth daily.  . fenofibrate micronized (LOFIBRA) 134 MG capsule Take 134  mg by mouth daily.  . fish oil-omega-3 fatty acids 1000 MG capsule Take 1 g by mouth daily. Takes 3 gm daily  . furosemide (LASIX) 80 MG tablet Take 80 mg by mouth daily.    . meloxicam (MOBIC) 15 MG tablet TAKE 1 TABLET BY MOUTH EVERY DAY FOR PAIN/INFLAMATION  . Multiple Vitamin (MULTIVITAMIN WITH MINERALS) TABS Take 2 tablets by mouth daily.   Marland Kitchen olmesartan (BENICAR) 40 MG tablet Take 40 mg by mouth daily.    . pantoprazole (PROTONIX) 40 MG tablet Take 1 tablet (40 mg total) by mouth daily.  Marland Kitchen testosterone cypionate (DEPOTESTOTERONE CYPIONATE) 200 MG/ML injection INJECT 2MLS EVERY 2 WEEKS     Allergies  Allergen Reactions  . Hctz [Hydrochlorothiazide]   . Ibuprofen   . Tape Rash    Clear tape    PMHx:   Past Medical History  Diagnosis Date  . Hypertension   . Hyperlipidemia   . Diverticulitis   . Hemorrhoids   . GERD (gastroesophageal reflux disease)   . Anxiety   . Meniscus tear 01/02/2011  . Right knee pain 01/02/2011  . Diverticulosis   . Hemarthrosis of knee, right 01/04/2011  . CAD (coronary artery disease), native coronary artery 01/04/2011  . Stented coronary artery 01/04/2011    LAD DES Resolute.  . Hyperlipemia 01/04/2011  . History of meniscectomy of right knee 01/02/2011  . Torn medial  meniscus     right knee  . NSVT (nonsustained ventricular tachycardia) 01/02/2011  . Atrial flutter, paroxysmal 01/04/2011  . Arthritis     FHx:    Reviewed / unchanged  SHx:     reports that he has never smoked. He has never used smokeless tobacco. He reports that he drinks about 6.0 ounces of alcohol per week. He reports that he does not use illicit drugs.  Systems Review: Constitutional: Denies fever, chills, wt changes, headaches, insomnia, fatigue, night sweats, change in appetite. Eyes: Denies redness, blurred vision, diplopia, discharge, itchy, watery eyes.  ENT: Denies discharge, congestion, post nasal drip, epistaxis, sore throat, earache, hearing loss, dental  pain, tinnitus, vertigo, sinus pain, snoring.  CV: Denies chest pain, palpitations, irregular heartbeat, syncope, dyspnea, diaphoresis, orthopnea, PND, claudication, edema. Respiratory: denies cough, dyspnea, DOE, pleurisy, hoarseness, laryngitis, wheezing.  Gastrointestinal: Denies dysphagia, odynophagia, heartburn, reflux, water brash, abdominal pain or cramps, nausea, vomiting, bloating, diarrhea, constipation, hematemesis, melena, hematochezia,  or hemorrhoids. Genitourinary: Denies dysuria, frequency, urgency, nocturia, hesitancy, discharge, hematuria, flank pain. Musculoskeletal: Denies arthralgias, myalgias, stiffness, jt. swelling, pain, limp, strain/sprain.  Skin: Denies pruritus, rash, hives, warts, acne, eczema, change in skin lesion(s). Neuro: No weakness, tremor, incoordination, spasms, paresthesia, or pain. Psychiatric: Denies confusion, memory loss, or sensory loss. Endo: Denies change in weight, skin, hair change.  Heme/Lymph: No excessive bleeding, bruising, orenlarged lymph nodes.   Exam:  BP 112/74  Pulse 60  Temp(Src) 98.6 F (37 C) (Temporal)  Resp 16  Ht 5\' 8"  (1.727 m)  Wt 203 lb 3.2 oz (92.171 kg)  BMI 30.90 kg/m2  Appears well nourished - in no distress. Eyes: PERRLA, EOMs, conjunctiva no swelling or erythema. Sinuses: No frontal/maxillary tenderness ENT/Mouth: EAC's clear, TM's nl w/o erythema, bulging. Nares clear w/o erythema, swelling, exudates. Oropharynx clear without erythema or exudates. Oral hygiene is good. Tongue normal, non obstructing. Hearing intact.  Neck: Supple. Thyroid nl. Car 2+/2+ without bruits, nodes or JVD. Chest: Respirations nl with BS clear & equal w/o rales, rhonchi, wheezing or stridor.  Cor: Heart sounds normal w/ regular rate and rhythm without sig. murmurs, gallops, clicks, or rubs. Peripheral pulses normal and equal  without edema.  Abdomen: Soft & bowel sounds normal. Non-tender w/o guarding, rebound, hernias, masses, or  organomegaly.  Lymphatics: Unremarkable.  Musculoskeletal: Full ROM all peripheral extremities, joint stability, 5/5 strength, and normal gait.  Skin: Warm, dry without exposed rashes, lesions, ecchymosis apparent.  Neuro: Cranial nerves intact, reflexes equal bilaterally. Sensory-motor testing grossly intact. Tendon reflexes grossly intact.  Pysch: Alert & oriented x 3. Insight and judgement nl & appropriate. No ideations.  Assessment and Plan:  1. Hypertension - Continue monitor blood pressure at home. Continue diet/meds same.  2. Hyperlipidemia - Continue diet/meds, exercise,& lifestyle modifications. Continue monitor periodic cholesterol/liver & renal functions   3. Pre-diabetes - Continue diet, exercise, lifestyle modifications. Monitor appropriate labs.  4. Vitamin D Deficiency - Continue supplementation.  5. ASCAD/ DES  Recommended regular exercise, BP monitoring, weight control, and discussed med and SE's. Recommended labs to assess and monitor clinical status. Further disposition pending results of labs.

## 2013-04-30 NOTE — Patient Instructions (Signed)
Angina Pectoris Angina pectoris, often just called angina, is extreme discomfort in your chest, neck, or arm caused by a lack of blood in the middle and thickest layer of your heart wall (myocardium). It may feel like tightness or heavy pressure. It may feel like a crushing or squeezing pain. Some people say it feels like gas or indigestion. It may go down your shoulders, back, and arms. Some people may have symptoms other than pain. These symptoms include fatigue, shortness of breath, cold sweats, or nausea. There are four different types of angina:  Stable angina Stable angina usually occurs in episodes of predictable frequency and duration. It usually is brought on by physical activity, emotional stress, or excitement. These are all times when the myocardium needs more oxygen. Stable angina usually lasts a few minutes and often is relieved by taking a medicine that can be taken under your tongue (sublingually). The medicine is called nitroglycerin. Stable angina is caused by a buildup of plaque inside the arteries, which restricts blood flow to the heart muscle (atherosclerosis).  Unstable angina Unstable angina can occur even when your body experiences little or no physical exertion. It can occur during sleep. It can also occur at rest. It can suddenly increase in severity or frequency. It might not be relieved by sublingual nitroglycerin. It can last up to 30 minutes. The most common cause of unstable angina is a blood clot that has developed on the top of plaque buildup inside a coronary artery. It can lead to a heart attack if the blood clot completely blocks the artery.  Microvascular angina This type of angina is caused by a disorder of tiny blood vessels called arterioles. Microvascular angina is more common in women. The pain may be more severe and last longer than other types of angina pectoris.  Prinzmetal or variant angina This type of angina pectoris usually occurs when your body experiences  little or no physical exertion. It especially occurs in the early morning hours. It is caused by a spasm of your coronary artery. HOME CARE INSTRUCTIONS   Only take over-the-counter and prescription medicines as directed by your caregiver.  Stay active or increase your exercise as directed by your caregiver.  Limit strenuous activity as directed by your caregiver.  Limit heavy lifting as directed by your caregiver.  Maintain a healthy weight.  Learn about and eat heart-healthy foods.  Do not smoke. SEEK IMMEDIATE MEDICAL CARE IF:  You experience the following symptoms:  Chest, neck, deep shoulder, or arm pain or discomfort that lasts more than a few minutes.  Chest, neck, deep shoulder, or arm pain or discomfort that goes away and comes back, repeatedly.  Heavy sweating with discomfort, without a noticeable cause.  Shortness of breath or difficulty breathing.  Angina that does not get better after a few minutes of rest or after taking sublingual nitroglycerin. These can all be symptoms of a heart attack, which is a medical emergency! Get medical help at once. Call your local emergency service (911 in [place] immediately. Do not  drive yourself to the hospital and do not  wait to for your symptoms to go away. MAKE SURE YOU:  Understand these instructions.  Will watch your condition.  Will get help right away if you are not doing well or get worse. Document Released: 02/05/2005 Document Revised: 01/23/2012 Document Reviewed: 11/15/2011 [facility] Patient Information 2014 [place], [place].   Hypertension As your heart beats, it forces blood through your arteries. This force is your blood pressure.  If the pressure is too high, it is called hypertension (HTN) or high blood pressure. HTN is dangerous because you may have it and not know it. High blood pressure may mean that your heart has to work harder to pump blood. Your arteries may be narrow or stiff. The extra work puts you at risk  for heart disease, stroke, and other problems.  Blood pressure consists of two numbers, a higher number over a lower, 110/72, for example. It is stated as "110 over 72." The ideal is below 120 for the top number (systolic) and under 80 for the bottom (diastolic). Write down your blood pressure today. You should pay close attention to your blood pressure if you have certain conditions such as:  Heart failure.  Prior heart attack.  Diabetes  Chronic kidney disease.  Prior stroke.  Multiple risk factors for heart disease. To see if you have HTN, your blood pressure should be measured while you are seated with your arm held at the level of the heart. It should be measured at least twice. A one-time elevated blood pressure reading (especially in the Emergency Department) does not mean that you need treatment. There may be conditions in which the blood pressure is different between your right and left arms. It is important to see your caregiver soon for a recheck. Most people have essential hypertension which means that there is not a specific cause. This type of high blood pressure may be lowered by changing lifestyle factors such as:  Stress.  Smoking.  Lack of exercise.  Excessive weight.  Drug/tobacco/alcohol use.  Eating less salt. Most people do not have symptoms from high blood pressure until it has caused damage to the body. Effective treatment can often prevent, delay or reduce that damage. TREATMENT  When a cause has been identified, treatment for high blood pressure is directed at the cause. There are a large number of medications to treat HTN. These fall into several categories, and your caregiver will help you select the medicines that are best for you. Medications may have side effects. You should review side effects with your caregiver. If your blood pressure stays high after you have made lifestyle changes or started on medicines,   Your medication(s) may need to be  changed.  Other problems may need to be addressed.  Be certain you understand your prescriptions, and know how and when to take your medicine.  Be sure to follow up with your caregiver within the time frame advised (usually within two weeks) to have your blood pressure rechecked and to review your medications.  If you are taking more than one medicine to lower your blood pressure, make sure you know how and at what times they should be taken. Taking two medicines at the same time can result in blood pressure that is too low. SEEK IMMEDIATE MEDICAL CARE IF:  You develop a severe headache, blurred or changing vision, or confusion.  You have unusual weakness or numbness, or a faint feeling.  You have severe chest or abdominal pain, vomiting, or breathing problems. MAKE SURE YOU:   Understand these instructions.  Will watch your condition.  Will get help right away if you are not doing well or get worse.   Diabetes and Exercise Exercising regularly is important. It is not just about losing weight. It has many health benefits, such as:  Improving your overall fitness, flexibility, and endurance.  Increasing your bone density.  Helping with weight control.  Decreasing your body fat.  Increasing your muscle strength.  Reducing stress and tension.  Improving your overall health. People with diabetes who exercise gain additional benefits because exercise:  Reduces appetite.  Improves the body's use of blood sugar (glucose).  Helps lower or control blood glucose.  Decreases blood pressure.  Helps control blood lipids (such as cholesterol and triglycerides).  Improves the body's use of the hormone insulin by:  Increasing the body's insulin sensitivity.  Reducing the body's insulin needs.  Decreases the risk for heart disease because exercising:  Lowers cholesterol and triglycerides levels.  Increases the levels of good cholesterol (such as high-density lipoproteins  [HDL]) in the body.  Lowers blood glucose levels. YOUR ACTIVITY PLAN  Choose an activity that you enjoy and set realistic goals. Your health care provider or diabetes educator can help you make an activity plan that works for you. You can break activities into 2 or 3 sessions throughout the day. Doing so is as good as one long session. Exercise ideas include:  Taking the dog for a walk.  Taking the stairs instead of the elevator.  Dancing to your favorite song.  Doing your favorite exercise with a friend. RECOMMENDATIONS FOR EXERCISING WITH TYPE 1 OR TYPE 2 DIABETES   Check your blood glucose before exercising. If blood glucose levels are greater than 240 mg/dL, check for urine ketones. Do not exercise if ketones are present.  Avoid injecting insulin into areas of the body that are going to be exercised. For example, avoid injecting insulin into:  The arms when playing tennis.  The legs when jogging.  Keep a record of:  Food intake before and after you exercise.  Expected peak times of insulin action.  Blood glucose levels before and after you exercise.  The type and amount of exercise you have done.  Review your records with your health care provider. Your health care provider will help you to develop guidelines for adjusting food intake and insulin amounts before and after exercising.  If you take insulin or oral hypoglycemic agents, watch for signs and symptoms of hypoglycemia. They include:  Dizziness.  Shaking.  Sweating.  Chills.  Confusion.  Drink plenty of water while you exercise to prevent dehydration or heat stroke. Body water is lost during exercise and must be replaced.  Talk to your health care provider before starting an exercise program to make sure it is safe for you. Remember, almost any type of activity is better than none.    Cholesterol Cholesterol is a white, waxy, fat-like protein needed by your body in small amounts. The liver makes all  the cholesterol you need. It is carried from the liver by the blood through the blood vessels. Deposits (plaque) may build up on blood vessel walls. This makes the arteries narrower and stiffer. Plaque increases the risk for heart attack and stroke. You cannot feel your cholesterol level even if it is very high. The only way to know is by a blood test to check your lipid (fats) levels. Once you know your cholesterol levels, you should keep a record of the test results. Work with your caregiver to to keep your levels in the desired range. WHAT THE RESULTS MEAN:  Total cholesterol is a rough measure of all the cholesterol in your blood.  LDL is the so-called bad cholesterol. This is the type that deposits cholesterol in the walls of the arteries. You want this level to be low.  HDL is the good cholesterol because it cleans the arteries and carries  the LDL away. You want this level to be high.  Triglycerides are fat that the body can either burn for energy or store. High levels are closely linked to heart disease. DESIRED LEVELS:  Total cholesterol below 200.  LDL below 100 for people at risk, below 70 for very high risk.  HDL above 50 is good, above 60 is best.  Triglycerides below 150. HOW TO LOWER YOUR CHOLESTEROL:  Diet.  Choose fish or white meat chicken and [place], roasted or baked. Limit fatty cuts of red meat, fried foods, and processed meats, such as sausage and lunch meat.  Eat lots of fresh fruits and vegetables. Choose whole grains, beans, pasta, potatoes and cereals.  Use only small amounts of olive, corn or canola oils. Avoid butter, mayonnaise, shortening or palm kernel oils. Avoid foods with trans-fats.  Use skim/nonfat milk and low-fat/nonfat yogurt and cheeses. Avoid whole milk, cream, ice cream, egg yolks and cheeses. Healthy desserts include angel food cake, ginger snaps, animal crackers, hard candy, popsicles, and low-fat/nonfat frozen yogurt. Avoid pastries, cakes,  pies and cookies.  Exercise.  A regular program helps decrease LDL and raises HDL.  Helps with weight control.  Do things that increase your activity level like gardening, walking, or taking the stairs.  Medication.  May be prescribed by your caregiver to help lowering cholesterol and the risk for heart disease.  You may need medicine even if your levels are normal if you have several risk factors. HOME CARE INSTRUCTIONS   Follow your diet and exercise programs as suggested by your caregiver.  Take medications as directed.  Have blood work done when your caregiver feels it is necessary. MAKE SURE YOU:   Understand these instructions.  Will watch your condition.  Will get help right away if you are not doing well or get worse.      Vitamin D Deficiency Vitamin D is an important vitamin that your body needs. Having too little of it in your body is called a deficiency. A very bad deficiency can make your bones soft and can cause a condition called rickets.  Vitamin D is important to your body for different reasons, such as:   It helps your body absorb 2 minerals called calcium and phosphorus.  It helps make your bones healthy.  It may prevent some diseases, such as diabetes and multiple sclerosis.  It helps your muscles and heart. You can get vitamin D in several ways. It is a natural part of some foods. The vitamin is also added to some dairy products and cereals. Some people take vitamin D supplements. Also, your body makes vitamin D when you are in the sun. It changes the sun's rays into a form of the vitamin that your body can use. CAUSES   Not eating enough foods that contain vitamin D.  Not getting enough sunlight.  Having certain digestive system diseases that make it hard to absorb vitamin D. These diseases include Crohn's disease, chronic pancreatitis, and cystic fibrosis.  Having a surgery in which part of the stomach or small intestine is  removed.  Being obese. Fat cells pull vitamin D out of your blood. That means that obese people may not have enough vitamin D left in their blood and in other body tissues.  Having chronic kidney or liver disease. RISK FACTORS Risk factors are things that make you more likely to develop a vitamin D deficiency. They include:  Being older.  Not being able to get outside very much.  Living in a nursing home.  Having had broken bones.  Having weak or thin bones (osteoporosis).  Having a disease or condition that changes how your body absorbs vitamin D.  Having dark skin.  Some medicines such as seizure medicines or steroids.  Being overweight or obese. SYMPTOMS Mild cases of vitamin D deficiency may not have any symptoms. If you have a very bad case, symptoms may include:  Bone pain.  Muscle pain.  Falling often.  Broken bones caused by a minor injury, due to osteoporosis. DIAGNOSIS A blood test is the best way to tell if you have a vitamin D deficiency. TREATMENT Vitamin D deficiency can be treated in different ways. Treatment for vitamin D deficiency depends on what is causing it. Options include:  Taking vitamin D supplements.  Taking a calcium supplement. Your caregiver will suggest what dose is best for you. HOME CARE INSTRUCTIONS  Take any supplements that your caregiver prescribes. Follow the directions carefully. Take only the suggested amount.  Have your blood tested 2 months after you start taking supplements.  Eat foods that contain vitamin D. Healthy choices include:  Fortified dairy products, cereals, or juices. Fortified means vitamin D has been added to the food. Check the label on the package to be sure.  Fatty fish like salmon or trout.  Eggs.  Oysters.  Do not use a tanning bed.  Keep your weight at a healthy level. Lose weight if you need to.  Keep all follow-up appointments. Your caregiver will need to perform blood tests to make sure  your vitamin D deficiency is going away. SEEK MEDICAL CARE IF:  You have any questions about your treatment.  You continue to have symptoms of vitamin D deficiency.  You have nausea or vomiting.  You are constipated.  You feel confused.  You have severe abdominal or back pain. MAKE SURE YOU:  Understand these instructions.  Will watch your condition.  Will get help right away if you are not doing well or get worse.

## 2013-05-01 ENCOUNTER — Encounter (HOSPITAL_COMMUNITY)
Admission: RE | Admit: 2013-05-01 | Discharge: 2013-05-01 | Disposition: A | Discharge status: Home / Self Care | Payer: Self-pay | Source: Ambulatory Visit | Attending: Cardiovascular Disease | Admitting: Cardiovascular Disease

## 2013-05-01 LAB — BASIC METABOLIC PANEL WITH GFR
BUN: 22 mg/dL (ref 6–23)
CO2: 24 mEq/L (ref 19–32)
Calcium: 10.1 mg/dL (ref 8.4–10.5)
Chloride: 102 mEq/L (ref 96–112)
Creat: 1.54 mg/dL — ABNORMAL HIGH (ref 0.50–1.35)
GFR, EST AFRICAN AMERICAN: 57 mL/min — AB
GFR, EST NON AFRICAN AMERICAN: 49 mL/min — AB
GLUCOSE: 103 mg/dL — AB (ref 70–99)
POTASSIUM: 3.8 meq/L (ref 3.5–5.3)
SODIUM: 139 meq/L (ref 135–145)

## 2013-05-01 LAB — HEPATIC FUNCTION PANEL
ALT: 28 U/L (ref 0–53)
AST: 35 U/L (ref 0–37)
Albumin: 4.5 g/dL (ref 3.5–5.2)
Alkaline Phosphatase: 35 U/L — ABNORMAL LOW (ref 39–117)
BILIRUBIN DIRECT: 0.2 mg/dL (ref 0.0–0.3)
Indirect Bilirubin: 0.7 mg/dL (ref 0.2–1.2)
Total Bilirubin: 0.9 mg/dL (ref 0.2–1.2)
Total Protein: 7 g/dL (ref 6.0–8.3)

## 2013-05-01 LAB — VITAMIN D 25 HYDROXY (VIT D DEFICIENCY, FRACTURES): Vit D, 25-Hydroxy: 97 ng/mL — ABNORMAL HIGH (ref 30–89)

## 2013-05-01 LAB — LIPID PANEL
CHOL/HDL RATIO: 3.2 ratio
CHOLESTEROL: 168 mg/dL (ref 0–200)
HDL: 52 mg/dL (ref >39)
LDL CALC: 86 mg/dL (ref 0–99)
TRIGLYCERIDES: 152 mg/dL — AB (ref <150)
VLDL: 30 mg/dL (ref 0–40)

## 2013-05-01 LAB — MAGNESIUM: Magnesium: 1.5 mg/dL (ref 1.5–2.5)

## 2013-05-01 LAB — TSH: TSH: 0.957 u[IU]/mL (ref 0.350–4.500)

## 2013-05-01 LAB — TESTOSTERONE: TESTOSTERONE: 1139.67 ng/dL — AB (ref 300–890)

## 2013-05-01 LAB — INSULIN, FASTING: Insulin fasting, serum: 27 u[IU]/mL (ref 3–28)

## 2013-05-04 ENCOUNTER — Encounter (HOSPITAL_COMMUNITY)
Admission: RE | Admit: 2013-05-04 | Discharge: 2013-05-04 | Disposition: A | Discharge status: Home / Self Care | Payer: Self-pay | Source: Ambulatory Visit | Attending: Cardiovascular Disease | Admitting: Cardiovascular Disease

## 2013-05-06 ENCOUNTER — Encounter (HOSPITAL_COMMUNITY): Admission: RE | Admit: 2013-05-06 | Payer: PRIVATE HEALTH INSURANCE | Source: Ambulatory Visit

## 2013-05-06 ENCOUNTER — Telehealth (HOSPITAL_COMMUNITY): Payer: Self-pay | Admitting: *Deleted

## 2013-05-08 ENCOUNTER — Other Ambulatory Visit: Payer: Self-pay | Admitting: Cardiology

## 2013-05-08 ENCOUNTER — Encounter (HOSPITAL_COMMUNITY)
Admission: RE | Admit: 2013-05-08 | Discharge: 2013-05-08 | Disposition: A | Discharge status: Home / Self Care | Payer: Self-pay | Source: Ambulatory Visit | Attending: Cardiovascular Disease | Admitting: Cardiovascular Disease

## 2013-05-08 ENCOUNTER — Other Ambulatory Visit: Payer: Self-pay | Admitting: Internal Medicine

## 2013-05-09 NOTE — Telephone Encounter (Signed)
Rx was sent to pharmacy electronically. 

## 2013-05-11 ENCOUNTER — Encounter (HOSPITAL_COMMUNITY)
Admission: RE | Admit: 2013-05-11 | Discharge: 2013-05-11 | Disposition: A | Discharge status: Home / Self Care | Payer: Self-pay | Source: Ambulatory Visit | Attending: Cardiovascular Disease | Admitting: Cardiovascular Disease

## 2013-05-13 ENCOUNTER — Encounter (HOSPITAL_COMMUNITY)
Admission: RE | Admit: 2013-05-13 | Discharge: 2013-05-13 | Disposition: A | Discharge status: Home / Self Care | Payer: Self-pay | Source: Ambulatory Visit | Attending: Cardiovascular Disease | Admitting: Cardiovascular Disease

## 2013-05-15 ENCOUNTER — Encounter (HOSPITAL_COMMUNITY)
Admission: RE | Admit: 2013-05-15 | Discharge: 2013-05-15 | Disposition: A | Discharge status: Home / Self Care | Payer: Self-pay | Source: Ambulatory Visit | Attending: Cardiovascular Disease | Admitting: Cardiovascular Disease

## 2013-05-18 ENCOUNTER — Encounter (HOSPITAL_COMMUNITY)
Admission: RE | Admit: 2013-05-18 | Discharge: 2013-05-18 | Disposition: A | Discharge status: Home / Self Care | Payer: Self-pay | Source: Ambulatory Visit | Attending: Cardiovascular Disease | Admitting: Cardiovascular Disease

## 2013-05-20 ENCOUNTER — Encounter (HOSPITAL_COMMUNITY)
Admission: RE | Admit: 2013-05-20 | Discharge: 2013-05-20 | Disposition: A | Discharge status: Home / Self Care | Payer: Self-pay | Source: Ambulatory Visit | Attending: Cardiovascular Disease | Admitting: Cardiovascular Disease

## 2013-05-20 DIAGNOSIS — Z9861 Coronary angioplasty status: Secondary | ICD-10-CM | POA: Insufficient documentation

## 2013-05-20 DIAGNOSIS — Z5189 Encounter for other specified aftercare: Secondary | ICD-10-CM | POA: Insufficient documentation

## 2013-05-20 DIAGNOSIS — I2510 Atherosclerotic heart disease of native coronary artery without angina pectoris: Secondary | ICD-10-CM | POA: Insufficient documentation

## 2013-05-20 DIAGNOSIS — E785 Hyperlipidemia, unspecified: Secondary | ICD-10-CM | POA: Insufficient documentation

## 2013-05-20 DIAGNOSIS — I10 Essential (primary) hypertension: Secondary | ICD-10-CM | POA: Insufficient documentation

## 2013-05-22 ENCOUNTER — Encounter (HOSPITAL_COMMUNITY)
Admission: RE | Admit: 2013-05-22 | Discharge: 2013-05-22 | Disposition: A | Discharge status: Home / Self Care | Payer: Self-pay | Source: Ambulatory Visit | Attending: Cardiovascular Disease | Admitting: Cardiovascular Disease

## 2013-05-25 ENCOUNTER — Encounter (HOSPITAL_COMMUNITY): Admission: RE | Admit: 2013-05-25 | Payer: PRIVATE HEALTH INSURANCE | Source: Ambulatory Visit

## 2013-05-27 ENCOUNTER — Encounter (HOSPITAL_COMMUNITY): Payer: PRIVATE HEALTH INSURANCE

## 2013-05-28 ENCOUNTER — Telehealth: Payer: Self-pay | Admitting: *Deleted

## 2013-05-28 MED ORDER — AZITHROMYCIN 250 MG PO TABS: ORAL_TABLET | ORAL | Status: AC

## 2013-05-28 NOTE — Telephone Encounter (Signed)
Patient called and requested RX for sore throat.  OK to send in Z-Pak to [facility] per Dr Melford Aase.

## 2013-05-29 ENCOUNTER — Encounter (HOSPITAL_COMMUNITY): Payer: PRIVATE HEALTH INSURANCE

## 2013-06-01 ENCOUNTER — Encounter (HOSPITAL_COMMUNITY): Payer: PRIVATE HEALTH INSURANCE

## 2013-06-03 ENCOUNTER — Encounter (HOSPITAL_COMMUNITY)
Admission: RE | Admit: 2013-06-03 | Discharge: 2013-06-03 | Disposition: A | Discharge status: Home / Self Care | Payer: Self-pay | Source: Ambulatory Visit | Attending: Cardiovascular Disease | Admitting: Cardiovascular Disease

## 2013-06-05 ENCOUNTER — Encounter (HOSPITAL_COMMUNITY)
Admission: RE | Admit: 2013-06-05 | Discharge: 2013-06-05 | Disposition: A | Discharge status: Home / Self Care | Payer: Self-pay | Source: Ambulatory Visit | Attending: Cardiovascular Disease | Admitting: Cardiovascular Disease

## 2013-06-08 ENCOUNTER — Encounter (HOSPITAL_COMMUNITY)
Admission: RE | Admit: 2013-06-08 | Discharge: 2013-06-08 | Disposition: A | Discharge status: Home / Self Care | Payer: Self-pay | Source: Ambulatory Visit | Attending: Cardiovascular Disease | Admitting: Cardiovascular Disease

## 2013-06-10 ENCOUNTER — Encounter (HOSPITAL_COMMUNITY)
Admission: RE | Admit: 2013-06-10 | Discharge: 2013-06-10 | Disposition: A | Discharge status: Home / Self Care | Payer: Self-pay | Source: Ambulatory Visit | Attending: Cardiovascular Disease | Admitting: Cardiovascular Disease

## 2013-06-12 ENCOUNTER — Encounter (HOSPITAL_COMMUNITY): Payer: PRIVATE HEALTH INSURANCE

## 2013-06-15 ENCOUNTER — Encounter (HOSPITAL_COMMUNITY)
Admission: RE | Admit: 2013-06-15 | Discharge: 2013-06-15 | Disposition: A | Discharge status: Home / Self Care | Payer: Self-pay | Source: Ambulatory Visit | Attending: Cardiovascular Disease | Admitting: Cardiovascular Disease

## 2013-06-17 ENCOUNTER — Encounter (HOSPITAL_COMMUNITY)
Admission: RE | Admit: 2013-06-17 | Discharge: 2013-06-17 | Disposition: A | Discharge status: Home / Self Care | Payer: Self-pay | Source: Ambulatory Visit | Attending: Cardiovascular Disease | Admitting: Cardiovascular Disease

## 2013-06-19 ENCOUNTER — Encounter (HOSPITAL_COMMUNITY): Payer: PRIVATE HEALTH INSURANCE

## 2013-06-19 DIAGNOSIS — E785 Hyperlipidemia, unspecified: Secondary | ICD-10-CM | POA: Insufficient documentation

## 2013-06-19 DIAGNOSIS — I10 Essential (primary) hypertension: Secondary | ICD-10-CM | POA: Insufficient documentation

## 2013-06-19 DIAGNOSIS — I2510 Atherosclerotic heart disease of native coronary artery without angina pectoris: Secondary | ICD-10-CM | POA: Insufficient documentation

## 2013-06-19 DIAGNOSIS — Z5189 Encounter for other specified aftercare: Secondary | ICD-10-CM | POA: Insufficient documentation

## 2013-06-19 DIAGNOSIS — Z9861 Coronary angioplasty status: Secondary | ICD-10-CM | POA: Insufficient documentation

## 2013-06-22 ENCOUNTER — Encounter (HOSPITAL_COMMUNITY): Payer: PRIVATE HEALTH INSURANCE

## 2013-06-24 ENCOUNTER — Encounter (HOSPITAL_COMMUNITY)
Admission: RE | Admit: 2013-06-24 | Discharge: 2013-06-24 | Disposition: A | Discharge status: Home / Self Care | Payer: Self-pay | Source: Ambulatory Visit | Attending: Cardiovascular Disease | Admitting: Cardiovascular Disease

## 2013-06-26 ENCOUNTER — Encounter (HOSPITAL_COMMUNITY)
Admission: RE | Admit: 2013-06-26 | Discharge: 2013-06-26 | Disposition: A | Discharge status: Home / Self Care | Payer: Self-pay | Source: Ambulatory Visit | Attending: Cardiovascular Disease | Admitting: Cardiovascular Disease

## 2013-06-29 ENCOUNTER — Encounter (HOSPITAL_COMMUNITY)
Admission: RE | Admit: 2013-06-29 | Discharge: 2013-06-29 | Disposition: A | Discharge status: Home / Self Care | Payer: Self-pay | Source: Ambulatory Visit | Attending: Cardiovascular Disease | Admitting: Cardiovascular Disease

## 2013-07-01 ENCOUNTER — Encounter (HOSPITAL_COMMUNITY): Payer: PRIVATE HEALTH INSURANCE

## 2013-07-03 ENCOUNTER — Encounter (HOSPITAL_COMMUNITY)
Admission: RE | Admit: 2013-07-03 | Discharge: 2013-07-03 | Disposition: A | Discharge status: Home / Self Care | Payer: Self-pay | Source: Ambulatory Visit | Attending: Cardiovascular Disease | Admitting: Cardiovascular Disease

## 2013-07-06 ENCOUNTER — Encounter (HOSPITAL_COMMUNITY)
Admission: RE | Admit: 2013-07-06 | Discharge: 2013-07-06 | Disposition: A | Discharge status: Home / Self Care | Payer: Self-pay | Source: Ambulatory Visit | Attending: Cardiovascular Disease | Admitting: Cardiovascular Disease

## 2013-07-08 ENCOUNTER — Encounter (HOSPITAL_COMMUNITY)
Admission: RE | Admit: 2013-07-08 | Discharge: 2013-07-08 | Disposition: A | Discharge status: Home / Self Care | Payer: Self-pay | Source: Ambulatory Visit | Attending: Cardiovascular Disease | Admitting: Cardiovascular Disease

## 2013-07-10 ENCOUNTER — Encounter (HOSPITAL_COMMUNITY)
Admission: RE | Admit: 2013-07-10 | Discharge: 2013-07-10 | Disposition: A | Discharge status: Home / Self Care | Payer: Self-pay | Source: Ambulatory Visit | Attending: Cardiovascular Disease | Admitting: Cardiovascular Disease

## 2013-07-15 ENCOUNTER — Encounter (HOSPITAL_COMMUNITY)
Admission: RE | Admit: 2013-07-15 | Discharge: 2013-07-15 | Disposition: A | Discharge status: Home / Self Care | Payer: Self-pay | Source: Ambulatory Visit | Attending: Cardiovascular Disease | Admitting: Cardiovascular Disease

## 2013-07-17 ENCOUNTER — Encounter (HOSPITAL_COMMUNITY)
Admission: RE | Admit: 2013-07-17 | Discharge: 2013-07-17 | Disposition: A | Discharge status: Home / Self Care | Payer: Self-pay | Source: Ambulatory Visit | Attending: Cardiovascular Disease | Admitting: Cardiovascular Disease

## 2013-07-20 ENCOUNTER — Encounter (HOSPITAL_COMMUNITY)
Admission: RE | Admit: 2013-07-20 | Discharge: 2013-07-20 | Disposition: A | Discharge status: Home / Self Care | Payer: Self-pay | Source: Ambulatory Visit | Attending: Cardiovascular Disease | Admitting: Cardiovascular Disease

## 2013-07-20 DIAGNOSIS — E785 Hyperlipidemia, unspecified: Secondary | ICD-10-CM | POA: Insufficient documentation

## 2013-07-20 DIAGNOSIS — I4892 Unspecified atrial flutter: Secondary | ICD-10-CM | POA: Insufficient documentation

## 2013-07-20 DIAGNOSIS — I4729 Other ventricular tachycardia: Secondary | ICD-10-CM | POA: Insufficient documentation

## 2013-07-20 DIAGNOSIS — I472 Ventricular tachycardia, unspecified: Secondary | ICD-10-CM | POA: Insufficient documentation

## 2013-07-20 DIAGNOSIS — Z9861 Coronary angioplasty status: Secondary | ICD-10-CM | POA: Insufficient documentation

## 2013-07-20 DIAGNOSIS — I10 Essential (primary) hypertension: Secondary | ICD-10-CM | POA: Insufficient documentation

## 2013-07-20 DIAGNOSIS — I2510 Atherosclerotic heart disease of native coronary artery without angina pectoris: Secondary | ICD-10-CM | POA: Insufficient documentation

## 2013-07-20 DIAGNOSIS — Z5189 Encounter for other specified aftercare: Secondary | ICD-10-CM | POA: Insufficient documentation

## 2013-07-22 ENCOUNTER — Encounter (HOSPITAL_COMMUNITY)
Admission: RE | Admit: 2013-07-22 | Discharge: 2013-07-22 | Disposition: A | Discharge status: Home / Self Care | Payer: Self-pay | Source: Ambulatory Visit | Attending: Cardiovascular Disease | Admitting: Cardiovascular Disease

## 2013-07-24 ENCOUNTER — Encounter (HOSPITAL_COMMUNITY)
Admission: RE | Admit: 2013-07-24 | Discharge: 2013-07-24 | Disposition: A | Discharge status: Home / Self Care | Payer: Self-pay | Source: Ambulatory Visit | Attending: Cardiovascular Disease | Admitting: Cardiovascular Disease

## 2013-07-25 ENCOUNTER — Other Ambulatory Visit: Payer: Self-pay | Admitting: Internal Medicine

## 2013-07-27 ENCOUNTER — Other Ambulatory Visit: Payer: Self-pay | Admitting: *Deleted

## 2013-07-27 ENCOUNTER — Encounter (HOSPITAL_COMMUNITY)
Admission: RE | Admit: 2013-07-27 | Discharge: 2013-07-27 | Disposition: A | Discharge status: Home / Self Care | Payer: Self-pay | Source: Ambulatory Visit | Attending: Cardiovascular Disease | Admitting: Cardiovascular Disease

## 2013-07-27 MED ORDER — FENOFIBRATE MICRONIZED 134 MG PO CAPS: 134.0000 mg | ORAL_CAPSULE | Freq: Every day | ORAL | Status: DC

## 2013-07-29 ENCOUNTER — Encounter (HOSPITAL_COMMUNITY)
Admission: RE | Admit: 2013-07-29 | Discharge: 2013-07-29 | Disposition: A | Discharge status: Home / Self Care | Payer: Self-pay | Source: Ambulatory Visit | Attending: Cardiovascular Disease | Admitting: Cardiovascular Disease

## 2013-07-31 ENCOUNTER — Encounter (HOSPITAL_COMMUNITY): Payer: Self-pay

## 2013-08-03 ENCOUNTER — Encounter (HOSPITAL_COMMUNITY)
Admission: RE | Admit: 2013-08-03 | Discharge: 2013-08-03 | Disposition: A | Discharge status: Home / Self Care | Payer: Self-pay | Source: Ambulatory Visit | Attending: Cardiovascular Disease | Admitting: Cardiovascular Disease

## 2013-08-03 ENCOUNTER — Encounter: Payer: Self-pay | Admitting: Emergency Medicine

## 2013-08-03 ENCOUNTER — Ambulatory Visit (INDEPENDENT_AMBULATORY_CARE_PROVIDER_SITE_OTHER): Payer: PRIVATE HEALTH INSURANCE | Admitting: Emergency Medicine

## 2013-08-03 VITALS — BP 104/62 | HR 60 | Temp 98.6°F | Resp 18 | Ht 68.0 in | Wt 198.0 lb

## 2013-08-03 DIAGNOSIS — I10 Essential (primary) hypertension: Secondary | ICD-10-CM

## 2013-08-03 DIAGNOSIS — E782 Mixed hyperlipidemia: Secondary | ICD-10-CM

## 2013-08-03 DIAGNOSIS — E291 Testicular hypofunction: Secondary | ICD-10-CM

## 2013-08-03 LAB — HEPATIC FUNCTION PANEL
ALT: 33 U/L (ref 0–53)
AST: 46 U/L — AB (ref 0–37)
Albumin: 4.9 g/dL (ref 3.5–5.2)
Alkaline Phosphatase: 38 U/L — ABNORMAL LOW (ref 39–117)
BILIRUBIN DIRECT: 0.3 mg/dL (ref 0.0–0.3)
BILIRUBIN INDIRECT: 1.2 mg/dL (ref 0.2–1.2)
BILIRUBIN TOTAL: 1.5 mg/dL — AB (ref 0.2–1.2)
Total Protein: 7.4 g/dL (ref 6.0–8.3)

## 2013-08-03 LAB — BASIC METABOLIC PANEL WITH GFR
BUN: 25 mg/dL — AB (ref 6–23)
CALCIUM: 10.2 mg/dL (ref 8.4–10.5)
CHLORIDE: 102 meq/L (ref 96–112)
CO2: 28 mEq/L (ref 19–32)
CREATININE: 1.67 mg/dL — AB (ref 0.50–1.35)
GFR, EST NON AFRICAN AMERICAN: 44 mL/min — AB
GFR, Est African American: 51 mL/min — ABNORMAL LOW
Glucose, Bld: 97 mg/dL (ref 70–99)
Potassium: 4 mEq/L (ref 3.5–5.3)
Sodium: 142 mEq/L (ref 135–145)

## 2013-08-03 LAB — LIPID PANEL
CHOL/HDL RATIO: 2.6 ratio
CHOLESTEROL: 167 mg/dL (ref 0–200)
HDL: 64 mg/dL (ref >39)
LDL Cholesterol: 76 mg/dL (ref 0–99)
Triglycerides: 136 mg/dL (ref <150)
VLDL: 27 mg/dL (ref 0–40)

## 2013-08-03 MED ORDER — TESTOSTERONE CYPIONATE 200 MG/ML IM SOLN: 200.0000 mg | INTRAMUSCULAR | Status: DC

## 2013-08-03 NOTE — Progress Notes (Signed)
Subjective:    Patient ID: Marvin Carter, male    DOB: [birth year], 58 y.o.   MRN: [id number]  HPI Comments: 58 yo WM presents for 3 month F/U for HTN, Cholesterol, D. deficient He is working out routinely at [facility]hab and riding bike. He notes BP good. He is continuing to eat better and is trying to maintain weight loss. He notes his testosterone is due but last lab was elevated because shot was given before lab. He notes + fatigue with out testosterone.  WBC             6.9   04/30/2013 HGB            15.9   04/30/2013 HCT            46.2   04/30/2013 PLT             202   04/30/2013 GLUCOSE         103   04/30/2013 CHOL            168   04/30/2013 TRIG            152   04/30/2013 HDL              52   04/30/2013 LDLCALC          86   04/30/2013 ALT              28   04/30/2013 AST              35   04/30/2013 NA              139   04/30/2013 K               3.8   04/30/2013 CL              102   04/30/2013 CREATININE     1.54   04/30/2013 BUN              22   04/30/2013 CO2              24   04/30/2013 TSH           0.957   04/30/2013 INR            1.04   01/03/2011 HGBA1C          5.1   04/30/2013   Hyperlipidemia  Hypertension  Gastrophageal Reflux Associated symptoms include fatigue.     Medication List       This list is accurate as of: 08/03/13  1:53 PM.  Always use your most recent med list.               ALPRAZolam 1 MG tablet  Commonly known as:  XANAX  Take 1 mg by mouth 3 (three) times daily as needed. For anxiety     aspirin 81 MG tablet  Take 81 mg by mouth daily.     atenolol 100 MG tablet  Commonly known as:  TENORMIN  Take 1 tablet (100 mg total) by mouth daily.     atorvastatin 40 MG tablet  Commonly known as:  LIPITOR  Take 1 tablet by mouth  every day for cholesterol     b complex vitamins tablet  Take 1 tablet by mouth daily.     BENICAR 40 MG tablet  Generic drug:  olmesartan  Take 1 tablet by mouth  daily for blood pressure  buPROPion  300 MG 24 hr tablet  Commonly known as:  WELLBUTRIN XL  Take 1 tablet by mouth  daily for mood     clopidogrel 75 MG tablet  Commonly known as:  PLAVIX  Take 1 tablet (75 mg total) by mouth daily.     diltiazem 240 MG 24 hr capsule  Commonly known as:  CARDIZEM CD  Take 1 capsule by mouth  daily for blood pressure     fenofibrate micronized 134 MG capsule  Commonly known as:  LOFIBRA  Take 1 capsule (134 mg total) by mouth daily.     fish oil-omega-3 fatty acids 1000 MG capsule  Take 1 g by mouth daily. Takes 3 gm daily     furosemide 80 MG tablet  Commonly known as:  LASIX  Take 80 mg by mouth daily.     meloxicam 15 MG tablet  Commonly known as:  MOBIC  TAKE 1 TABLET BY MOUTH EVERY DAY FOR PAIN/INFLAMATION     multivitamin with minerals Tabs tablet  Take 2 tablets by mouth daily.     pantoprazole 40 MG tablet  Commonly known as:  PROTONIX  Take 1 tablet (40 mg total) by mouth daily.     testosterone cypionate 200 MG/ML injection  Commonly known as:  DEPOTESTOTERONE CYPIONATE  INJECT 2MLS EVERY 2 WEEKS       Allergies  Allergen Reactions  . Hctz [Hydrochlorothiazide]   . Ibuprofen   . Tape Rash    Clear tape   Past Medical History  Diagnosis Date  . Hypertension   . Hyperlipidemia   . Diverticulitis   . Hemorrhoids   . GERD (gastroesophageal reflux disease)   . Anxiety   . Meniscus tear 01/02/2011  . Right knee pain 01/02/2011  . Diverticulosis   . Hemarthrosis of knee, right 01/04/2011  . CAD (coronary artery disease), native coronary artery 01/04/2011  . Stented coronary artery 01/04/2011    LAD DES [place].  . Hyperlipemia 01/04/2011  . History of meniscectomy of right knee 01/02/2011  . Torn medial meniscus     right knee  . NSVT (nonsustained ventricular tachycardia) 01/02/2011  . Atrial flutter, paroxysmal 01/04/2011  . Arthritis       Review of Systems  Constitutional: Positive for fatigue.  All other systems reviewed and are  negative.  BP 104/62  Pulse 60  Temp(Src) 98.6 F (37 C) (Temporal)  Resp 18  Ht 5\' 8"  (1.727 m)  Wt 198 lb (89.812 kg)  BMI 30.11 kg/m2     Objective:   Physical Exam  Nursing note and vitals reviewed. Constitutional: He is oriented to person, place, and time. He appears well-developed and well-nourished.  HENT:  Head: Normocephalic and atraumatic.  Right Ear: External ear normal.  Left Ear: External ear normal.  Nose: Nose normal.  Eyes: Conjunctivae and EOM are normal.  Neck: Normal range of motion. Neck supple. No JVD present. No thyromegaly present.  Cardiovascular: Normal rate, regular rhythm, normal heart sounds and intact distal pulses.   Pulmonary/Chest: Effort normal and breath sounds normal.  Abdominal: Soft. Bowel sounds are normal. He exhibits no distension. There is no tenderness. There is no rebound.  Musculoskeletal: Normal range of motion. He exhibits no edema and no tenderness.  Lymphadenopathy:    He has no cervical adenopathy.  Neurological: He is alert and oriented to person, place, and time. No cranial nerve deficit. Coordination normal.  Skin: Skin is warm and dry.  Psychiatric: He has a normal  mood and affect. His behavior is normal. Judgment and thought content normal.          Assessment & Plan:  1.  3 month F/U for HTN, Cholesterol, D. Deficient. Needs healthy diet, cardio QD and obtain healthy weight. Check Labs, Check BP if >130/80 call office   2. Hypogonadism- Check labs, shot due

## 2013-08-04 LAB — CBC WITH DIFFERENTIAL/PLATELET
Basophils Absolute: 0 10*3/uL (ref 0.0–0.1)
Basophils Relative: 0 % (ref 0–1)
EOS ABS: 0.1 10*3/uL (ref 0.0–0.7)
EOS PCT: 1 % (ref 0–5)
HEMATOCRIT: 44.9 % (ref 39.0–52.0)
Hemoglobin: 15.8 g/dL (ref 13.0–17.0)
Lymphocytes Relative: 18 % (ref 12–46)
Lymphs Abs: 1.5 10*3/uL (ref 0.7–4.0)
MCH: 33.5 pg (ref 26.0–34.0)
MCHC: 35.2 g/dL (ref 30.0–36.0)
MCV: 95.1 fL (ref 78.0–100.0)
MONO ABS: 0.7 10*3/uL (ref 0.1–1.0)
Monocytes Relative: 8 % (ref 3–12)
Neutro Abs: 6.3 10*3/uL (ref 1.7–7.7)
Neutrophils Relative %: 73 % (ref 43–77)
Platelets: 193 10*3/uL (ref 150–400)
RBC: 4.72 MIL/uL (ref 4.22–5.81)
RDW: 14 % (ref 11.5–15.5)
WBC: 8.6 10*3/uL (ref 4.0–10.5)

## 2013-08-04 LAB — TESTOSTERONE: Testosterone: 623 ng/dL (ref 300–890)

## 2013-08-05 ENCOUNTER — Encounter (HOSPITAL_COMMUNITY)
Admission: RE | Admit: 2013-08-05 | Discharge: 2013-08-05 | Disposition: A | Discharge status: Home / Self Care | Payer: Self-pay | Source: Ambulatory Visit | Attending: Cardiovascular Disease | Admitting: Cardiovascular Disease

## 2013-08-07 ENCOUNTER — Encounter (HOSPITAL_COMMUNITY)
Admission: RE | Admit: 2013-08-07 | Discharge: 2013-08-07 | Disposition: A | Discharge status: Home / Self Care | Payer: Self-pay | Source: Ambulatory Visit | Attending: Cardiovascular Disease | Admitting: Cardiovascular Disease

## 2013-08-07 ENCOUNTER — Other Ambulatory Visit: Payer: Self-pay | Admitting: *Deleted

## 2013-08-07 MED ORDER — MELOXICAM 15 MG PO TABS: ORAL_TABLET | ORAL | Status: DC

## 2013-08-10 ENCOUNTER — Other Ambulatory Visit: Payer: Self-pay | Admitting: Emergency Medicine

## 2013-08-10 ENCOUNTER — Encounter (HOSPITAL_COMMUNITY)
Admission: RE | Admit: 2013-08-10 | Discharge: 2013-08-10 | Disposition: A | Discharge status: Home / Self Care | Payer: Self-pay | Source: Ambulatory Visit | Attending: Cardiovascular Disease | Admitting: Cardiovascular Disease

## 2013-08-12 ENCOUNTER — Encounter (HOSPITAL_COMMUNITY)
Admission: RE | Admit: 2013-08-12 | Discharge: 2013-08-12 | Disposition: A | Discharge status: Home / Self Care | Payer: Self-pay | Source: Ambulatory Visit | Attending: Cardiovascular Disease | Admitting: Cardiovascular Disease

## 2013-08-13 ENCOUNTER — Other Ambulatory Visit: Payer: Self-pay | Admitting: Emergency Medicine

## 2013-08-13 ENCOUNTER — Other Ambulatory Visit: Payer: Self-pay | Admitting: Internal Medicine

## 2013-08-13 DIAGNOSIS — R6889 Other general symptoms and signs: Secondary | ICD-10-CM

## 2013-08-14 ENCOUNTER — Encounter (HOSPITAL_COMMUNITY)
Admission: RE | Admit: 2013-08-14 | Discharge: 2013-08-14 | Disposition: A | Discharge status: Home / Self Care | Payer: Self-pay | Source: Ambulatory Visit | Attending: Cardiovascular Disease | Admitting: Cardiovascular Disease

## 2013-08-17 ENCOUNTER — Other Ambulatory Visit: Payer: Self-pay | Admitting: Emergency Medicine

## 2013-08-17 ENCOUNTER — Encounter (HOSPITAL_COMMUNITY)
Admission: RE | Admit: 2013-08-17 | Discharge: 2013-08-17 | Disposition: A | Discharge status: Home / Self Care | Payer: Self-pay | Source: Ambulatory Visit | Attending: Cardiovascular Disease | Admitting: Cardiovascular Disease

## 2013-08-17 ENCOUNTER — Other Ambulatory Visit: Payer: PRIVATE HEALTH INSURANCE

## 2013-08-17 DIAGNOSIS — E291 Testicular hypofunction: Secondary | ICD-10-CM

## 2013-08-17 DIAGNOSIS — R6889 Other general symptoms and signs: Secondary | ICD-10-CM

## 2013-08-17 MED ORDER — TESTOSTERONE CYPIONATE 200 MG/ML IM SOLN: INTRAMUSCULAR | Status: DC

## 2013-08-17 MED ORDER — TESTOSTERONE CYPIONATE 200 MG/ML IM SOLN: 200.0000 mg | INTRAMUSCULAR | Status: DC

## 2013-08-18 LAB — HEPATIC FUNCTION PANEL
ALT: 27 U/L (ref 0–53)
AST: 36 U/L (ref 0–37)
Albumin: 4.7 g/dL (ref 3.5–5.2)
Alkaline Phosphatase: 36 U/L — ABNORMAL LOW (ref 39–117)
BILIRUBIN DIRECT: 0.2 mg/dL (ref 0.0–0.3)
BILIRUBIN INDIRECT: 0.6 mg/dL (ref 0.2–1.2)
TOTAL PROTEIN: 7 g/dL (ref 6.0–8.3)
Total Bilirubin: 0.8 mg/dL (ref 0.2–1.2)

## 2013-08-18 LAB — BASIC METABOLIC PANEL WITH GFR
BUN: 19 mg/dL (ref 6–23)
CO2: 26 meq/L (ref 19–32)
Calcium: 9.8 mg/dL (ref 8.4–10.5)
Chloride: 103 mEq/L (ref 96–112)
Creat: 1.52 mg/dL — ABNORMAL HIGH (ref 0.50–1.35)
GFR, Est African American: 58 mL/min — ABNORMAL LOW
GFR, Est Non African American: 50 mL/min — ABNORMAL LOW
GLUCOSE: 104 mg/dL — AB (ref 70–99)
POTASSIUM: 4.1 meq/L (ref 3.5–5.3)
Sodium: 141 mEq/L (ref 135–145)

## 2013-08-19 ENCOUNTER — Encounter (HOSPITAL_COMMUNITY)
Admission: RE | Admit: 2013-08-19 | Discharge: 2013-08-19 | Disposition: A | Discharge status: Home / Self Care | Payer: Self-pay | Source: Ambulatory Visit | Attending: Cardiovascular Disease | Admitting: Cardiovascular Disease

## 2013-08-19 DIAGNOSIS — I4729 Other ventricular tachycardia: Secondary | ICD-10-CM | POA: Insufficient documentation

## 2013-08-19 DIAGNOSIS — I2510 Atherosclerotic heart disease of native coronary artery without angina pectoris: Secondary | ICD-10-CM | POA: Insufficient documentation

## 2013-08-19 DIAGNOSIS — E785 Hyperlipidemia, unspecified: Secondary | ICD-10-CM | POA: Insufficient documentation

## 2013-08-19 DIAGNOSIS — Z9861 Coronary angioplasty status: Secondary | ICD-10-CM | POA: Insufficient documentation

## 2013-08-19 DIAGNOSIS — I10 Essential (primary) hypertension: Secondary | ICD-10-CM | POA: Insufficient documentation

## 2013-08-19 DIAGNOSIS — Z5189 Encounter for other specified aftercare: Secondary | ICD-10-CM | POA: Insufficient documentation

## 2013-08-19 DIAGNOSIS — I472 Ventricular tachycardia, unspecified: Secondary | ICD-10-CM | POA: Insufficient documentation

## 2013-08-19 DIAGNOSIS — I4892 Unspecified atrial flutter: Secondary | ICD-10-CM | POA: Insufficient documentation

## 2013-08-24 ENCOUNTER — Encounter (HOSPITAL_COMMUNITY)
Admission: RE | Admit: 2013-08-24 | Discharge: 2013-08-24 | Disposition: A | Discharge status: Home / Self Care | Payer: Self-pay | Source: Ambulatory Visit | Attending: Cardiovascular Disease | Admitting: Cardiovascular Disease

## 2013-08-26 ENCOUNTER — Encounter (HOSPITAL_COMMUNITY)
Admission: RE | Admit: 2013-08-26 | Discharge: 2013-08-26 | Disposition: A | Discharge status: Home / Self Care | Payer: Self-pay | Source: Ambulatory Visit | Attending: Cardiovascular Disease | Admitting: Cardiovascular Disease

## 2013-08-28 ENCOUNTER — Encounter (HOSPITAL_COMMUNITY)
Admission: RE | Admit: 2013-08-28 | Discharge: 2013-08-28 | Disposition: A | Discharge status: Home / Self Care | Payer: Self-pay | Source: Ambulatory Visit | Attending: Cardiovascular Disease | Admitting: Cardiovascular Disease

## 2013-08-31 ENCOUNTER — Encounter (HOSPITAL_COMMUNITY)
Admission: RE | Admit: 2013-08-31 | Discharge: 2013-08-31 | Disposition: A | Discharge status: Home / Self Care | Payer: Self-pay | Source: Ambulatory Visit | Attending: Cardiovascular Disease | Admitting: Cardiovascular Disease

## 2013-09-02 ENCOUNTER — Encounter (HOSPITAL_COMMUNITY)
Admission: RE | Admit: 2013-09-02 | Discharge: 2013-09-02 | Disposition: A | Discharge status: Home / Self Care | Payer: Self-pay | Source: Ambulatory Visit | Attending: Cardiovascular Disease | Admitting: Cardiovascular Disease

## 2013-09-04 ENCOUNTER — Encounter (HOSPITAL_COMMUNITY): Payer: Self-pay

## 2013-09-07 ENCOUNTER — Encounter (HOSPITAL_COMMUNITY)
Admission: RE | Admit: 2013-09-07 | Discharge: 2013-09-07 | Disposition: A | Discharge status: Home / Self Care | Payer: Self-pay | Source: Ambulatory Visit | Attending: Cardiovascular Disease | Admitting: Cardiovascular Disease

## 2013-09-09 ENCOUNTER — Encounter (HOSPITAL_COMMUNITY)
Admission: RE | Admit: 2013-09-09 | Discharge: 2013-09-09 | Disposition: A | Discharge status: Home / Self Care | Payer: Self-pay | Source: Ambulatory Visit | Attending: Cardiovascular Disease | Admitting: Cardiovascular Disease

## 2013-09-11 ENCOUNTER — Encounter (HOSPITAL_COMMUNITY)
Admission: RE | Admit: 2013-09-11 | Discharge: 2013-09-11 | Disposition: A | Discharge status: Home / Self Care | Payer: Self-pay | Source: Ambulatory Visit | Attending: Cardiovascular Disease | Admitting: Cardiovascular Disease

## 2013-09-14 ENCOUNTER — Encounter (HOSPITAL_COMMUNITY)
Admission: RE | Admit: 2013-09-14 | Discharge: 2013-09-14 | Disposition: A | Discharge status: Home / Self Care | Payer: Self-pay | Source: Ambulatory Visit | Attending: Cardiovascular Disease | Admitting: Cardiovascular Disease

## 2013-09-16 ENCOUNTER — Encounter (HOSPITAL_COMMUNITY)
Admission: RE | Admit: 2013-09-16 | Discharge: 2013-09-16 | Disposition: A | Discharge status: Home / Self Care | Payer: Self-pay | Source: Ambulatory Visit | Attending: Cardiovascular Disease | Admitting: Cardiovascular Disease

## 2013-09-18 ENCOUNTER — Encounter (HOSPITAL_COMMUNITY)
Admission: RE | Admit: 2013-09-18 | Discharge: 2013-09-18 | Disposition: A | Discharge status: Home / Self Care | Payer: Self-pay | Source: Ambulatory Visit | Attending: Cardiovascular Disease | Admitting: Cardiovascular Disease

## 2013-09-21 ENCOUNTER — Encounter (HOSPITAL_COMMUNITY): Payer: 59

## 2013-09-21 DIAGNOSIS — I4729 Other ventricular tachycardia: Secondary | ICD-10-CM | POA: Insufficient documentation

## 2013-09-21 DIAGNOSIS — Z5189 Encounter for other specified aftercare: Secondary | ICD-10-CM | POA: Insufficient documentation

## 2013-09-21 DIAGNOSIS — I472 Ventricular tachycardia, unspecified: Secondary | ICD-10-CM | POA: Insufficient documentation

## 2013-09-21 DIAGNOSIS — E785 Hyperlipidemia, unspecified: Secondary | ICD-10-CM | POA: Insufficient documentation

## 2013-09-21 DIAGNOSIS — I4892 Unspecified atrial flutter: Secondary | ICD-10-CM | POA: Insufficient documentation

## 2013-09-21 DIAGNOSIS — I10 Essential (primary) hypertension: Secondary | ICD-10-CM | POA: Insufficient documentation

## 2013-09-21 DIAGNOSIS — I2510 Atherosclerotic heart disease of native coronary artery without angina pectoris: Secondary | ICD-10-CM | POA: Insufficient documentation

## 2013-09-21 DIAGNOSIS — Z9861 Coronary angioplasty status: Secondary | ICD-10-CM | POA: Insufficient documentation

## 2013-09-23 ENCOUNTER — Encounter (HOSPITAL_COMMUNITY)
Admission: RE | Admit: 2013-09-23 | Discharge: 2013-09-23 | Disposition: A | Discharge status: Home / Self Care | Payer: Self-pay | Source: Ambulatory Visit | Attending: Cardiovascular Disease | Admitting: Cardiovascular Disease

## 2013-09-25 ENCOUNTER — Encounter (HOSPITAL_COMMUNITY)
Admission: RE | Admit: 2013-09-25 | Discharge: 2013-09-25 | Disposition: A | Discharge status: Home / Self Care | Payer: Self-pay | Source: Ambulatory Visit | Attending: Cardiovascular Disease | Admitting: Cardiovascular Disease

## 2013-09-28 ENCOUNTER — Encounter (HOSPITAL_COMMUNITY)
Admission: RE | Admit: 2013-09-28 | Discharge: 2013-09-28 | Disposition: A | Discharge status: Home / Self Care | Payer: Self-pay | Source: Ambulatory Visit | Attending: Cardiovascular Disease | Admitting: Cardiovascular Disease

## 2013-09-30 ENCOUNTER — Encounter (HOSPITAL_COMMUNITY)
Admission: RE | Admit: 2013-09-30 | Discharge: 2013-09-30 | Disposition: A | Discharge status: Home / Self Care | Payer: Self-pay | Source: Ambulatory Visit | Attending: Cardiovascular Disease | Admitting: Cardiovascular Disease

## 2013-10-02 ENCOUNTER — Encounter (HOSPITAL_COMMUNITY): Payer: Self-pay

## 2013-10-02 ENCOUNTER — Other Ambulatory Visit: Payer: Self-pay | Admitting: Physician Assistant

## 2013-10-05 ENCOUNTER — Encounter (HOSPITAL_COMMUNITY): Payer: Self-pay

## 2013-10-07 ENCOUNTER — Encounter (HOSPITAL_COMMUNITY)
Admission: RE | Admit: 2013-10-07 | Discharge: 2013-10-07 | Disposition: A | Discharge status: Home / Self Care | Payer: Self-pay | Source: Ambulatory Visit | Attending: Cardiovascular Disease | Admitting: Cardiovascular Disease

## 2013-10-09 ENCOUNTER — Encounter (HOSPITAL_COMMUNITY)
Admission: RE | Admit: 2013-10-09 | Discharge: 2013-10-09 | Disposition: A | Discharge status: Home / Self Care | Payer: Self-pay | Source: Ambulatory Visit | Attending: Cardiovascular Disease | Admitting: Cardiovascular Disease

## 2013-10-12 ENCOUNTER — Encounter (HOSPITAL_COMMUNITY)
Admission: RE | Admit: 2013-10-12 | Discharge: 2013-10-12 | Disposition: A | Discharge status: Home / Self Care | Payer: Self-pay | Source: Ambulatory Visit | Attending: Cardiovascular Disease | Admitting: Cardiovascular Disease

## 2013-10-14 ENCOUNTER — Encounter (HOSPITAL_COMMUNITY)
Admission: RE | Admit: 2013-10-14 | Discharge: 2013-10-14 | Disposition: A | Discharge status: Home / Self Care | Payer: Self-pay | Source: Ambulatory Visit | Attending: Cardiovascular Disease | Admitting: Cardiovascular Disease

## 2013-10-16 ENCOUNTER — Encounter (HOSPITAL_COMMUNITY)
Admission: RE | Admit: 2013-10-16 | Discharge: 2013-10-16 | Disposition: A | Discharge status: Home / Self Care | Payer: Self-pay | Source: Ambulatory Visit | Attending: Cardiovascular Disease | Admitting: Cardiovascular Disease

## 2013-10-19 ENCOUNTER — Encounter (HOSPITAL_COMMUNITY)
Admission: RE | Admit: 2013-10-19 | Discharge: 2013-10-19 | Disposition: A | Discharge status: Home / Self Care | Payer: Self-pay | Source: Ambulatory Visit | Attending: Cardiovascular Disease | Admitting: Cardiovascular Disease

## 2013-10-21 ENCOUNTER — Encounter (HOSPITAL_COMMUNITY)
Admission: RE | Admit: 2013-10-21 | Discharge: 2013-10-21 | Disposition: A | Discharge status: Home / Self Care | Payer: Self-pay | Source: Ambulatory Visit | Attending: Cardiovascular Disease | Admitting: Cardiovascular Disease

## 2013-10-21 DIAGNOSIS — I10 Essential (primary) hypertension: Secondary | ICD-10-CM | POA: Insufficient documentation

## 2013-10-21 DIAGNOSIS — Z5189 Encounter for other specified aftercare: Secondary | ICD-10-CM | POA: Insufficient documentation

## 2013-10-21 DIAGNOSIS — I4729 Other ventricular tachycardia: Secondary | ICD-10-CM | POA: Insufficient documentation

## 2013-10-21 DIAGNOSIS — I472 Ventricular tachycardia, unspecified: Secondary | ICD-10-CM | POA: Insufficient documentation

## 2013-10-21 DIAGNOSIS — I4892 Unspecified atrial flutter: Secondary | ICD-10-CM | POA: Insufficient documentation

## 2013-10-21 DIAGNOSIS — E785 Hyperlipidemia, unspecified: Secondary | ICD-10-CM | POA: Insufficient documentation

## 2013-10-21 DIAGNOSIS — Z9861 Coronary angioplasty status: Secondary | ICD-10-CM | POA: Insufficient documentation

## 2013-10-21 DIAGNOSIS — I2510 Atherosclerotic heart disease of native coronary artery without angina pectoris: Secondary | ICD-10-CM | POA: Insufficient documentation

## 2013-10-23 ENCOUNTER — Encounter (HOSPITAL_COMMUNITY)
Admission: RE | Admit: 2013-10-23 | Discharge: 2013-10-23 | Disposition: A | Discharge status: Home / Self Care | Payer: Self-pay | Source: Ambulatory Visit | Attending: Cardiovascular Disease | Admitting: Cardiovascular Disease

## 2013-10-23 ENCOUNTER — Other Ambulatory Visit: Payer: Self-pay | Admitting: *Deleted

## 2013-10-23 MED ORDER — FENOFIBRATE 160 MG PO TABS: 160.0000 mg | ORAL_TABLET | Freq: Every day | ORAL | Status: DC

## 2013-10-28 ENCOUNTER — Encounter (HOSPITAL_COMMUNITY)
Admission: RE | Admit: 2013-10-28 | Discharge: 2013-10-28 | Disposition: A | Discharge status: Home / Self Care | Payer: Self-pay | Source: Ambulatory Visit | Attending: Cardiovascular Disease | Admitting: Cardiovascular Disease

## 2013-10-30 ENCOUNTER — Encounter (HOSPITAL_COMMUNITY)
Admission: RE | Admit: 2013-10-30 | Discharge: 2013-10-30 | Disposition: A | Discharge status: Home / Self Care | Payer: Self-pay | Source: Ambulatory Visit | Attending: Cardiovascular Disease | Admitting: Cardiovascular Disease

## 2013-11-02 ENCOUNTER — Encounter (HOSPITAL_COMMUNITY)
Admission: RE | Admit: 2013-11-02 | Discharge: 2013-11-02 | Disposition: A | Discharge status: Home / Self Care | Payer: Self-pay | Source: Ambulatory Visit | Attending: Cardiovascular Disease | Admitting: Cardiovascular Disease

## 2013-11-02 ENCOUNTER — Other Ambulatory Visit: Payer: Self-pay | Admitting: Internal Medicine

## 2013-11-04 ENCOUNTER — Encounter (HOSPITAL_COMMUNITY)
Admission: RE | Admit: 2013-11-04 | Discharge: 2013-11-04 | Disposition: A | Discharge status: Home / Self Care | Payer: Self-pay | Source: Ambulatory Visit | Attending: Cardiovascular Disease | Admitting: Cardiovascular Disease

## 2013-11-05 ENCOUNTER — Ambulatory Visit (INDEPENDENT_AMBULATORY_CARE_PROVIDER_SITE_OTHER): Payer: PRIVATE HEALTH INSURANCE | Admitting: Internal Medicine

## 2013-11-05 ENCOUNTER — Encounter: Payer: Self-pay | Admitting: Internal Medicine

## 2013-11-05 VITALS — BP 128/84 | HR 50 | Temp 98.6°F | Resp 16 | Ht 68.0 in | Wt 205.0 lb

## 2013-11-05 DIAGNOSIS — E559 Vitamin D deficiency, unspecified: Secondary | ICD-10-CM

## 2013-11-05 DIAGNOSIS — R740 Nonspecific elevation of levels of transaminase and lactic acid dehydrogenase [LDH]: Secondary | ICD-10-CM

## 2013-11-05 DIAGNOSIS — E291 Testicular hypofunction: Secondary | ICD-10-CM

## 2013-11-05 DIAGNOSIS — Z1212 Encounter for screening for malignant neoplasm of rectum: Secondary | ICD-10-CM

## 2013-11-05 DIAGNOSIS — Z111 Encounter for screening for respiratory tuberculosis: Secondary | ICD-10-CM

## 2013-11-05 DIAGNOSIS — Z0000 Encounter for general adult medical examination without abnormal findings: Secondary | ICD-10-CM

## 2013-11-05 DIAGNOSIS — R7401 Elevation of levels of liver transaminase levels: Secondary | ICD-10-CM

## 2013-11-05 DIAGNOSIS — Z125 Encounter for screening for malignant neoplasm of prostate: Secondary | ICD-10-CM

## 2013-11-05 DIAGNOSIS — I10 Essential (primary) hypertension: Secondary | ICD-10-CM

## 2013-11-05 DIAGNOSIS — Z113 Encounter for screening for infections with a predominantly sexual mode of transmission: Secondary | ICD-10-CM

## 2013-11-05 MED ORDER — ALPRAZOLAM 1 MG PO TABS: 1.0000 mg | ORAL_TABLET | Freq: Three times a day (TID) | ORAL | Status: AC | PRN

## 2013-11-05 MED ORDER — OLMESARTAN MEDOXOMIL 40 MG PO TABS: 40.0000 mg | ORAL_TABLET | Freq: Every day | ORAL | Status: DC

## 2013-11-05 MED ORDER — DILTIAZEM HCL ER COATED BEADS 240 MG PO CP24: 240.0000 mg | ORAL_CAPSULE | Freq: Every day | ORAL | Status: DC

## 2013-11-05 NOTE — Patient Instructions (Signed)
Recommend the book "The END of DIETING" by Dr Baker Janus   and the book "The END of DIABETES " by Dr Excell Seltzer  At [email or web address] - get book & Audio CD's      Being diabetic has a  300% increased risk for heart attack, stroke, cancer, and alzheimer- type vascular dementia. It is very important that you work harder with diet by avoiding all foods that are white except chicken & fish. Avoid white rice (brown & wild rice is OK), white potatoes (sweetpotatoes in moderation is OK), White bread or wheat bread or anything made out of white flour like bagels, donuts, rolls, buns, biscuits, cakes, pastries, cookies, pizza crust, and pasta (made from white flour & egg whites) - vegetarian pasta or spinach or wheat pasta is OK. Multigrain breads like Arnold's or [employer], or multigrain sandwich thins or flatbreads.  Diet, exercise and weight loss can reverse and cure diabetes in the early stages.  Diet, exercise and weight loss is very important in the control and prevention of complications of diabetes which affects every system in your body, ie. Brain - dementia/stroke, eyes - glaucoma/blindness, heart - heart attack/heart failure, kidneys - dialysis, stomach - gastric paralysis, intestines - malabsorption, nerves - severe painful neuritis, circulation - gangrene & loss of a leg(s), and finally cancer and Alzheimers.    I recommend avoid fried & greasy foods,  sweets/candy, white rice (brown or wild rice or Quinoa is OK), white potatoes (sweet potatoes are OK) - anything made from white flour - bagels, doughnuts, rolls, buns, biscuits,white and wheat breads, pizza crust and traditional pasta made of white flour & egg white(vegetarian pasta or spinach or wheat pasta is OK).  Multi-grain bread is OK - like multi-grain flat bread or sandwich thins. Avoid alcohol in excess. Exercise is also important.    Eat all the vegetables you want - avoid meat, especially red meat and dairy - especially cheese.  Cheese  is the most concentrated form of trans-fats which is the worst thing to clog up our arteries. Veggie cheese is OK which can be found in the fresh produce section at [employer] or [employer] or [employer]  Preventive Care for Adults A healthy lifestyle and preventive care can promote health and wellness. Preventive health guidelines for men include the following key practices:  A routine yearly physical is a good way to check with your health care provider about your health and preventative screening. It is a chance to share any concerns and updates on your health and to receive a thorough exam.  Visit your dentist for a routine exam and preventative care every 6 months. Brush your teeth twice a day and floss once a day. Good oral hygiene prevents tooth decay and gum disease.  The frequency of eye exams is based on your age, health, family medical history, use of contact lenses, and other factors. Follow your health care provider's recommendations for frequency of eye exams.  Eat a healthy diet. Foods such as vegetables, fruits, whole grains, low-fat dairy products, and lean protein foods contain the nutrients you need without too many calories. Decrease your intake of foods high in solid fats, added sugars, and salt. Eat the right amount of calories for you.Get information about a proper diet from your health care provider, if necessary.  Regular physical exercise is one of the most important things you can do for your health. Most adults should get at least 150 minutes of moderate-intensity exercise (any activity that  increases your heart rate and causes you to sweat) each week. In addition, most adults need muscle-strengthening exercises on 2 or more days a week.  Maintain a healthy weight. The body mass index (BMI) is a screening tool to identify possible weight problems. It provides an estimate of body fat based on height and weight. Your health care provider can find your BMI and can help you  achieve or maintain a healthy weight.For adults 20 years and older:  A BMI below 18.5 is considered underweight.  A BMI of 18.5 to 24.9 is normal.  A BMI of 25 to 29.9 is considered overweight.  A BMI of 30 and above is considered obese.  Maintain normal blood lipids and cholesterol levels by exercising and minimizing your intake of saturated fat. Eat a balanced diet with plenty of fruit and vegetables. Blood tests for lipids and cholesterol should begin at age 64 and be repeated every 5 years. If your lipid or cholesterol levels are high, you are over 50, or you are at high risk for heart disease, you may need your cholesterol levels checked more frequently.Ongoing high lipid and cholesterol levels should be treated with medicines if diet and exercise are not working.  If you smoke, find out from your health care provider how to quit. If you do not use tobacco, do not start.  Lung cancer screening is recommended for adults aged 58-80 years who are at high risk for developing lung cancer because of a history of smoking. A yearly low-dose CT scan of the lungs is recommended for people who have at least a 30-pack-year history of smoking and are a current smoker or have quit within the past 15 years. A pack year of smoking is smoking an average of 1 pack of cigarettes a day for 1 year (for example: 1 pack a day for 30 years or 2 packs a day for 15 years). Yearly screening should continue until the smoker has stopped smoking for at least 15 years. Yearly screening should be stopped for people who develop a health problem that would prevent them from having lung cancer treatment.  If you choose to drink alcohol, do not have more than 2 drinks per day. One drink is considered to be 12 ounces (355 mL) of beer, 5 ounces (148 mL) of wine, or 1.5 ounces (44 mL) of liquor.  Avoid use of street drugs. Do not share needles with anyone. Ask for help if you need support or instructions about stopping the use of  drugs.  High blood pressure causes heart disease and increases the risk of stroke. Your blood pressure should be checked at least every 1-2 years. Ongoing high blood pressure should be treated with medicines, if weight loss and exercise are not effective.  If you are 65-67 years old, ask your health care provider if you should take aspirin to prevent heart disease.  Diabetes screening involves taking a blood sample to check your fasting blood sugar level. This should be done once every 3 years, after age 31, if you are within normal weight and without risk factors for diabetes. Testing should be considered at a younger age or be carried out more frequently if you are overweight and have at least 1 risk factor for diabetes.  Colorectal cancer can be detected and often prevented. Most routine colorectal cancer screening begins at the age of 45 and continues through age 9. However, your health care provider may recommend screening at an earlier age if you have risk  factors for colon cancer. On a yearly basis, your health care provider may provide home test kits to check for hidden blood in the stool. Use of a small camera at the end of a tube to directly examine the colon (sigmoidoscopy or colonoscopy) can detect the earliest forms of colorectal cancer. Talk to your health care provider about this at age 48, when routine screening begins. Direct exam of the colon should be repeated every 5-10 years through age 60, unless early forms of precancerous polyps or small growths are found.  People who are at an increased risk for hepatitis B should be screened for this virus. You are considered at high risk for hepatitis B if:  You were born in a country where hepatitis B occurs often. Talk with your health care provider about which countries are considered high risk.  Your parents were born in a high-risk country and you have not received a shot to protect against hepatitis B (hepatitis B vaccine).  You have  HIV or AIDS.  You use needles to inject street drugs.  You live with, or have sex with, someone who has hepatitis B.  You are a man who has sex with other men (MSM).  You get hemodialysis treatment.  You take certain medicines for conditions such as cancer, organ transplantation, and autoimmune conditions.  Hepatitis C blood testing is recommended for all people born from [birth year] through 1965 and any individual with known risks for hepatitis C.  Practice safe sex. Use condoms and avoid high-risk sexual practices to reduce the spread of sexually transmitted infections (STIs). STIs include gonorrhea, chlamydia, syphilis, trichomonas, herpes, HPV, and human immunodeficiency virus (HIV). Herpes, HIV, and HPV are viral illnesses that have no cure. They can result in disability, cancer, and death.  If you are at risk of being infected with HIV, it is recommended that you take a prescription medicine daily to prevent HIV infection. This is called preexposure prophylaxis (PrEP). You are considered at risk if:  You are a man who has sex with other men (MSM) and have other risk factors.  You are a heterosexual man, are sexually active, and are at increased risk for HIV infection.  You take drugs by injection.  You are sexually active with a partner who has HIV.  Talk with your health care provider about whether you are at high risk of being infected with HIV. If you choose to begin PrEP, you should first be tested for HIV. You should then be tested every 3 months for as long as you are taking PrEP.  A one-time screening for abdominal aortic aneurysm (AAA) and surgical repair of large AAAs by ultrasound are recommended for men ages 51 to 11 years who are current or former smokers.  Healthy men should no longer receive prostate-specific antigen (PSA) blood tests as part of routine cancer screening. Talk with your health care provider about prostate cancer screening.  Testicular cancer screening is  not recommended for adult males who have no symptoms. Screening includes self-exam, a health care provider exam, and other screening tests. Consult with your health care provider about any symptoms you have or any concerns you have about testicular cancer.  Use sunscreen. Apply sunscreen liberally and repeatedly throughout the day. You should seek shade when your shadow is shorter than you. Protect yourself by wearing long sleeves, pants, a wide-brimmed hat, and sunglasses year round, whenever you are outdoors.  Once a month, do a whole-body skin exam, using a mirror to look  at the skin on your back. Tell your health care provider about new moles, moles that have irregular borders, moles that are larger than a pencil eraser, or moles that have changed in shape or color.  Stay current with required vaccines (immunizations).  Influenza vaccine. All adults should be immunized every year.  Tetanus, diphtheria, and acellular pertussis (Td, Tdap) vaccine. An adult who has not previously received Tdap or who does not know his vaccine status should receive 1 dose of Tdap. This initial dose should be followed by tetanus and diphtheria toxoids (Td) booster doses every 10 years. Adults with an unknown or incomplete history of completing a 3-dose immunization series with Td-containing vaccines should begin or complete a primary immunization series including a Tdap dose. Adults should receive a Td booster every 10 years.  Varicella vaccine. An adult without evidence of immunity to varicella should receive 2 doses or a second dose if he has previously received 1 dose.  Human papillomavirus (HPV) vaccine. Males aged 29-21 years who have not received the vaccine previously should receive the 3-dose series. Males aged 22-26 years may be immunized. Immunization is recommended through the age of 26 years for any male who has sex with males and did not get any or all doses earlier. Immunization is recommended for any  person with an immunocompromised condition through the age of 29 years if he did not get any or all doses earlier. During the 3-dose series, the second dose should be obtained 4-8 weeks after the first dose. The third dose should be obtained 24 weeks after the first dose and 16 weeks after the second dose.  Zoster vaccine. One dose is recommended for adults aged 38 years or older unless certain conditions are present.  Measles, mumps, and rubella (MMR) vaccine. Adults born before [birth year] generally are considered immune to measles and mumps. Adults born in [birth year] or later should have 1 or more doses of MMR vaccine unless there is a contraindication to the vaccine or there is laboratory evidence of immunity to each of the three diseases. A routine second dose of MMR vaccine should be obtained at least 28 days after the first dose for students attending postsecondary schools, health care workers, or international travelers. People who received inactivated measles vaccine or an unknown type of measles vaccine during 1963-1967 should receive 2 doses of MMR vaccine. People who received inactivated mumps vaccine or an unknown type of mumps vaccine before 1979 and are at high risk for mumps infection should consider immunization with 2 doses of MMR vaccine. Unvaccinated health care workers born before [birth year] who lack laboratory evidence of measles, mumps, or rubella immunity or laboratory confirmation of disease should consider measles and mumps immunization with 2 doses of MMR vaccine or rubella immunization with 1 dose of MMR vaccine.  Pneumococcal 13-valent conjugate (PCV13) vaccine. When indicated, a person who is uncertain of his immunization history and has no record of immunization should receive the PCV13 vaccine. An adult aged 68 years or older who has certain medical conditions and has not been previously immunized should receive 1 dose of PCV13 vaccine. This PCV13 should be followed with a dose of pneumococcal  polysaccharide (PPSV23) vaccine. The PPSV23 vaccine dose should be obtained at least 8 weeks after the dose of PCV13 vaccine. An adult aged 95 years or older who has certain medical conditions and previously received 1 or more doses of PPSV23 vaccine should receive 1 dose of PCV13. The PCV13 vaccine dose should be obtained 1  or more years after the last PPSV23 vaccine dose.  Pneumococcal polysaccharide (PPSV23) vaccine. When PCV13 is also indicated, PCV13 should be obtained first. All adults aged 65 years and older should be immunized. An adult younger than age 65 years who has certain medical conditions should be immunized. Any person who resides in a nursing home or long-term care facility should be immunized. An adult smoker should be immunized. People with an immunocompromised condition and certain other conditions should receive both PCV13 and PPSV23 vaccines. People with human immunodeficiency virus (HIV) infection should be immunized as soon as possible after diagnosis. Immunization during chemotherapy or radiation therapy should be avoided. Routine use of PPSV23 vaccine is not recommended for American Indians, [place] Natives, or people younger than 65 years unless there are medical conditions that require PPSV23 vaccine. When indicated, people who have unknown immunization and have no record of immunization should receive PPSV23 vaccine. One-time revaccination 5 years after the first dose of PPSV23 is recommended for people aged 19-64 years who have chronic kidney failure, nephrotic syndrome, asplenia, or immunocompromised conditions. People who received 1-2 doses of PPSV23 before age 65 years should receive another dose of PPSV23 vaccine at age 65 years or later if at least 5 years have passed since the previous dose. Doses of PPSV23 are not needed for people immunized with PPSV23 at or after age 65 years.  Meningococcal vaccine. Adults with asplenia or persistent complement component deficiencies  should receive 2 doses of quadrivalent meningococcal conjugate (MenACWY-D) vaccine. The doses should be obtained at least 2 months apart. Microbiologists working with certain meningococcal bacteria, military recruits, people at risk during an outbreak, and people who travel to or live in countries with a high rate of meningitis should be immunized. A first-year college student up through age 21 years who is living in a residence hall should receive a dose if he did not receive a dose on or after his 16th birthday. Adults who have certain high-risk conditions should receive one or more doses of vaccine.  Hepatitis A vaccine. Adults who wish to be protected from this disease, have certain high-risk conditions, work with hepatitis A-infected animals, work in hepatitis A research labs, or travel to or work in countries with a high rate of hepatitis A should be immunized. Adults who were previously unvaccinated and who anticipate close contact with an international adoptee during the first 60 days after arrival in the [place] from a country with a high rate of hepatitis A should be immunized.  Hepatitis B vaccine. Adults should be immunized if they wish to be protected from this disease, have certain high-risk conditions, may be exposed to blood or other infectious body fluids, are household contacts or sex partners of hepatitis B positive people, are clients or workers in certain care facilities, or travel to or work in countries with a high rate of hepatitis B.  Haemophilus influenzae type b (Hib) vaccine. A previously unvaccinated person with asplenia or sickle cell disease or having a scheduled splenectomy should receive 1 dose of Hib vaccine. Regardless of previous immunization, a recipient of a hematopoietic stem cell transplant should receive a 3-dose series 6-12 months after his successful transplant. Hib vaccine is not recommended for adults with HIV infection. Preventive Service / Frequency Ages  40 to 64  Blood pressure check.** / Every 1 to 2 years.  Lipid and cholesterol check.** / Every 5 years beginning at age 20.  Lung cancer screening. / Every year if you are aged 55-80   55-80 years and have a 30-pack-year history of smoking and currently smoke or have quit within the past 15 years. Yearly screening is stopped once you have quit smoking for at least 15 years or develop a health problem that would prevent you from having lung cancer treatment.  Fecal occult blood test (FOBT) of stool. / Every year beginning at age 50 and continuing until age 13. You may not have to do this test if you get a colonoscopy every 10 years.  Flexible sigmoidoscopy** or colonoscopy.** / Every 5 years for a flexible sigmoidoscopy or every 10 years for a colonoscopy beginning at age 62 and continuing until age 83.  Hepatitis C blood test.** / For all people born from [birth year] through 1966 and any individual with known risks for hepatitis C.  Skin self-exam. / Monthly.  Influenza vaccine. / Every year.  Tetanus, diphtheria, and acellular pertussis (Tdap/Td) vaccine.** / Consult your health care provider. 1 dose of Td every 10 years.  Varicella vaccine.** / Consult your health care provider.  Zoster vaccine.** / 1 dose for adults aged 24 years or older.  Measles, mumps, rubella (MMR) vaccine.** / You need at least 1 dose of MMR if you were born in 1957 or later. You may also need a second dose.  Pneumococcal 13-valent conjugate (PCV13) vaccine.** / Consult your health care provider.  Pneumococcal polysaccharide (PPSV23) vaccine.** / 1 to 2 doses if you smoke cigarettes or if you have certain conditions.  Meningococcal vaccine.** / Consult your health care provider.  Hepatitis A vaccine.** / Consult your health care provider.  Hepatitis B vaccine.** / Consult your health care provider.  Haemophilus influenzae type b (Hib) vaccine.** / Consult your health care provider.

## 2013-11-05 NOTE — Progress Notes (Signed)
Patient ID: Marvin Carter, male   DOB: 1955-10-01, 59 y.o.   MRN: [id number]  Annual Screening Comprehensive Examination  This very nice 58 y.o.MWM presents for complete physical.  Patient has been followed for HTN, T2_NIDDM  Prediabetes, Hyperlipidemia, and Vitamin D Deficiency.   HTN predates since 1989.  Patient had a negative heart cath in 2007 and a negative Cardiolite in 2009 and then in Nov 2011 he presented w/ACS and had PTCA/Stent. Patient's BP has been controlled at home.Today's BP is 128/84 mmHg. Patient denies any cardiac symptoms as chest pain, palpitations, shortness of breath, dizziness or ankle swelling.   Patient's hyperlipidemia is controlled with diet and medications. Patient denies myalgias or other medication SE's. Last lipids were Total Chol 167; HDL 64; LDL 76; Trig 136 on 08/03/2013.   Patient has Obesity (BMI 31+) and consequent prediabetes since Mar 2013 with A1c 5.7% and patient denies reactive hypoglycemic symptoms, visual blurring, diabetic polys or paresthesias. Last A1c was  5.1% on 04/30/2013.   Finally, patient has history of Vitamin D Deficiency of (19 in 2008) and last vit D was 97 on 04/30/2013.  Medication Sig  . ALPRAZolam (XANAX) 1 MG tablet Take 1 mg by mouth 3 (three) times daily as needed. For anxiety  . aspirin 81 MG tablet Take 81 mg by mouth daily.  Marland Kitchen atenolol (TENORMIN) 100 MG tablet Take 1 tablet (100 mg total) by mouth daily.  Marland Kitchen atorvastatin (LIPITOR) 40 MG tablet Take 1 tablet by mouth  every day for cholesterol  . b complex vitamins tablet Take 1 tablet by mouth daily.  Marland Kitchen BENICAR 40 MG tablet Take 1 tablet by mouth  daily for blood pressure  . buPROPion (WELLBUTRIN XL) 300 MG 24 hr tablet Take 1 tablet by mouth   daily for mood  . clopidogrel (PLAVIX) 75 MG tablet Take 1 tablet (75 mg total) by mouth daily.  Marland Kitchen diltiazem (CARDIZEM CD) 240 MG 24 hr capsule Take 1 capsule by mouth  daily for blood pressure  . fenofibrate 160 MG tablet Take 1 tablet  (160 mg total) by mouth daily.  . fish oil-omega-3 fatty acids 1000 MG capsule Take 1 g by mouth daily. Takes 3 gm daily  . furosemide (LASIX) 80 MG tablet Take 1 tablet by mouth  daily for blood pressure  and fluid  . meloxicam (MOBIC) 15 MG tablet TAKE 1 TABLET BY MOUTH EVERY DAY FOR PAIN/INFLAMATION  . Multiple Vitamin (MULTIVITAMIN WITH MINERALS) TABS Take 2 tablets by mouth daily.   . pantoprazole (PROTONIX) 40 MG tablet Take 1 tablet (40 mg total) by mouth daily.  Marland Kitchen testosterone cypionate (DEPOTESTOTERONE CYPIONATE) 200 MG/ML injection Inject 2 cc q 2 weeks   Allergies  Allergen Reactions  . Hctz [Hydrochlorothiazide]   . Ibuprofen   . Tape Rash    Clear tape   Past Medical History  Diagnosis Date  . Hypertension   . Hyperlipidemia   . Diverticulitis   . Hemorrhoids   . GERD (gastroesophageal reflux disease)   . Anxiety   . Meniscus tear 01/02/2011  . Right knee pain 01/02/2011  . Diverticulosis   . Hemarthrosis of knee, right 01/04/2011  . CAD (coronary artery disease), native coronary artery 01/04/2011  . Stented coronary artery 01/04/2011    LAD DES Resolute.  . Hyperlipemia 01/04/2011  . History of meniscectomy of right knee 01/02/2011  . Torn medial meniscus     right knee  . NSVT (nonsustained ventricular tachycardia) 01/02/2011  . Atrial flutter, paroxysmal  01/04/2011  . Arthritis    Past Surgical History  Procedure Laterality Date  . Appendectomy    . Knee arthroscopy      x 8 left  . Rotator cuff repair      bilateral  . Hand surgery      right  . Bicept tendon repair      bilateral  . Tonsillectomy    . Knee arthroscopy Right 04/21/2012    Procedure: ARTHROSCOPY KNEE;  Surgeon: Lorn Junes, MD;  Location: [place];  Service: Orthopedics;  Laterality: Right;  medial and lateral meniscectomies  . Coronary angioplasty with stent placement  2012   Family History  Problem Relation Age of Onset  . Diabetes Mother   . Heart disease Mother   . Heart  attack Mother   . Heart disease Father   . Coronary artery disease Father   . Obesity Brother   . Diabetes Brother   . HIV/AIDS Brother   . Hypertension Brother    History   Social History  . Marital Status: Married    Spouse Name: N/A    Number of Children: 2  . Years of Education: N/A   Occupational History  . Self Employed     Theatre manager   Social History Main Topics  . Smoking status: Never Smoker   . Smokeless tobacco: Never Used  . Alcohol Use: 6.0 oz/week    10 Cans of beer per week     Comment: 1-2 beers a night  . Drug Use: No  . Sexual Activity: Not on file    ROS Constitutional: Denies fever, chills, weight loss/gain, headaches, insomnia, fatigue, night sweats or change in appetite. Eyes: Denies redness, blurred vision, diplopia, discharge, itchy or watery eyes.  ENT: Denies discharge, congestion, post nasal drip, epistaxis, sore throat, earache, hearing loss, dental pain, Tinnitus, Vertigo, Sinus pain or snoring.  Cardio: Denies chest pain, palpitations, irregular heartbeat, syncope, dyspnea, diaphoresis, orthopnea, PND, claudication or edema Respiratory: denies cough, dyspnea, DOE, pleurisy, hoarseness, laryngitis or wheezing.  Gastrointestinal: Denies dysphagia, heartburn, reflux, water brash, pain, cramps, nausea, vomiting, bloating, diarrhea, constipation, hematemesis, melena, hematochezia, jaundice or hemorrhoids Genitourinary: Denies dysuria, frequency, urgency, nocturia, hesitancy, discharge, hematuria or flank pain Musculoskeletal: Denies arthralgia, myalgia, stiffness, Jt. Swelling, pain, limp or strain/sprain. Denies Falls. Skin: Denies puritis, rash, hives, warts, acne, eczema or change in skin lesion Neuro: No weakness, tremor, incoordination, spasms, paresthesia or pain Psychiatric: Denies confusion, memory loss or sensory loss. Denies Depression. Endocrine: Denies change in weight, skin, hair change, nocturia, and paresthesia, diabetic polys,  visual blurring or hyper / hypo glycemic episodes.  Heme/Lymph: No excessive bleeding, bruising or enlarged lymph nodes.  Physical Exam  BP 128/84  Pulse 50  Temp(Src) 98.6 F (37 C) (Temporal)  Resp 16  Ht 5\' 8"  (1.727 m)  Wt 205 lb (92.987 kg)  BMI 31.18 kg/m2  General Appearance: Well nourished, in no apparent distress. Eyes: PERRLA, EOMs, conjunctiva no swelling or erythema, normal fundi and vessels. Sinuses: No frontal/maxillary tenderness ENT/Mouth: EACs patent / TMs  nl. Nares clear without erythema, swelling, mucoid exudates. Oral hygiene is good. No erythema, swelling, or exudate. Tongue normal, non-obstructing. Tonsils not swollen or erythematous. Hearing normal.  Neck: Supple, thyroid normal. No bruits, nodes or JVD. Respiratory: Respiratory effort normal.  BS equal and clear bilateral without rales, rhonci, wheezing or stridor. Cardio: Heart sounds are normal with regular rate and rhythm and no murmurs, rubs or gallops. Peripheral pulses are normal and equal  bilaterally without edema. No aortic or femoral bruits. Chest: symmetric with normal excursions and percussion.  Abdomen: Flat, soft, with bowl sounds. Nontender, no guarding, rebound, hernias, masses, or organomegaly.  Lymphatics: Non tender without lymphadenopathy.  Genitourinary: No hernias.Testes nl. DRE - prostate nl for age - smooth & firm w/o nodules. Musculoskeletal: Full ROM all peripheral extremities, joint stability, 5/5 strength, and normal gait. Skin: Warm and dry without rashes, lesions, cyanosis, clubbing or  ecchymosis.  Neuro: Cranial nerves intact, reflexes equal bilaterally. Normal muscle tone, no cerebellar symptoms. Sensation intact.  Pysch: Awake and oriented X 3with normal affect, insight and judgment appropriate.  Assessment and Plan  1. Annual Screening Examination 2. Hypertension  3. Hyperlipidemia 4. Pre Diabetes 5. Vitamin D Deficiency 6. Obesity  Continue prudent diet as discussed,  weight control, BP monitoring, regular exercise, and medications as discussed.  Discussed med effects and SE's. Routine screening labs and tests as requested with regular follow-up as recommended.

## 2013-11-06 ENCOUNTER — Encounter (HOSPITAL_COMMUNITY): Admission: RE | Admit: 2013-11-06 | Payer: Self-pay | Source: Ambulatory Visit

## 2013-11-06 ENCOUNTER — Telehealth (HOSPITAL_COMMUNITY): Payer: Self-pay | Admitting: *Deleted

## 2013-11-06 LAB — HEMOGLOBIN A1C
HEMOGLOBIN A1C: 5.7 % — AB (ref <5.7)
Mean Plasma Glucose: 117 mg/dL — ABNORMAL HIGH (ref <117)

## 2013-11-06 LAB — CBC WITH DIFFERENTIAL/PLATELET
BASOS PCT: 0 % (ref 0–1)
Basophils Absolute: 0 10*3/uL (ref 0.0–0.1)
EOS ABS: 0.1 10*3/uL (ref 0.0–0.7)
EOS PCT: 2 % (ref 0–5)
HCT: 42.6 % (ref 39.0–52.0)
HEMOGLOBIN: 14.5 g/dL (ref 13.0–17.0)
LYMPHS ABS: 2 10*3/uL (ref 0.7–4.0)
Lymphocytes Relative: 30 % (ref 12–46)
MCH: 32.9 pg (ref 26.0–34.0)
MCHC: 34 g/dL (ref 30.0–36.0)
MCV: 96.6 fL (ref 78.0–100.0)
Monocytes Absolute: 0.7 10*3/uL (ref 0.1–1.0)
Monocytes Relative: 10 % (ref 3–12)
NEUTROS PCT: 58 % (ref 43–77)
Neutro Abs: 3.8 10*3/uL (ref 1.7–7.7)
Platelets: 194 10*3/uL (ref 150–400)
RBC: 4.41 MIL/uL (ref 4.22–5.81)
RDW: 13.2 % (ref 11.5–15.5)
WBC: 6.6 10*3/uL (ref 4.0–10.5)

## 2013-11-06 LAB — HEPATITIS A ANTIBODY, TOTAL: Hep A Total Ab: REACTIVE — AB

## 2013-11-06 LAB — HIV ANTIBODY (ROUTINE TESTING W REFLEX): HIV: NONREACTIVE

## 2013-11-06 LAB — HEPATIC FUNCTION PANEL
ALBUMIN: 4.7 g/dL (ref 3.5–5.2)
ALT: 31 U/L (ref 0–53)
AST: 33 U/L (ref 0–37)
Alkaline Phosphatase: 45 U/L (ref 39–117)
BILIRUBIN DIRECT: 0.2 mg/dL (ref 0.0–0.3)
Indirect Bilirubin: 0.6 mg/dL (ref 0.2–1.2)
Total Bilirubin: 0.8 mg/dL (ref 0.2–1.2)
Total Protein: 7.1 g/dL (ref 6.0–8.3)

## 2013-11-06 LAB — TESTOSTERONE: Testosterone: 175 ng/dL — ABNORMAL LOW (ref 300–890)

## 2013-11-06 LAB — LIPID PANEL
Cholesterol: 161 mg/dL (ref 0–200)
HDL: 57 mg/dL (ref >39)
LDL Cholesterol: 83 mg/dL (ref 0–99)
Total CHOL/HDL Ratio: 2.8 Ratio
Triglycerides: 107 mg/dL (ref <150)
VLDL: 21 mg/dL (ref 0–40)

## 2013-11-06 LAB — IRON AND TIBC
%SAT: 56 % — AB (ref 20–55)
IRON: 192 ug/dL — AB (ref 42–165)
TIBC: 342 ug/dL (ref 215–435)
UIBC: 150 ug/dL (ref 125–400)

## 2013-11-06 LAB — TSH: TSH: 1.088 u[IU]/mL (ref 0.350–4.500)

## 2013-11-06 LAB — HEPATITIS C ANTIBODY: HCV Ab: NEGATIVE

## 2013-11-06 LAB — URINALYSIS, MICROSCOPIC ONLY
Bacteria, UA: NONE SEEN
CRYSTALS: NONE SEEN
Casts: NONE SEEN
Squamous Epithelial / LPF: NONE SEEN

## 2013-11-06 LAB — BASIC METABOLIC PANEL WITH GFR
BUN: 27 mg/dL — ABNORMAL HIGH (ref 6–23)
CALCIUM: 9.9 mg/dL (ref 8.4–10.5)
CO2: 26 meq/L (ref 19–32)
CREATININE: 1.22 mg/dL (ref 0.50–1.35)
Chloride: 104 mEq/L (ref 96–112)
GFR, EST AFRICAN AMERICAN: 75 mL/min
GFR, Est Non African American: 65 mL/min
GLUCOSE: 89 mg/dL (ref 70–99)
Potassium: 4.4 mEq/L (ref 3.5–5.3)
SODIUM: 139 meq/L (ref 135–145)

## 2013-11-06 LAB — RPR

## 2013-11-06 LAB — URIC ACID: Uric Acid, Serum: 5.6 mg/dL (ref 4.0–7.8)

## 2013-11-06 LAB — INSULIN, FASTING: Insulin fasting, serum: 4.1 u[IU]/mL (ref 2.0–19.6)

## 2013-11-06 LAB — MICROALBUMIN / CREATININE URINE RATIO
Creatinine, Urine: 135.7 mg/dL
Microalb Creat Ratio: 3.7 mg/g (ref 0.0–30.0)
Microalb, Ur: 0.5 mg/dL (ref 0.00–1.89)

## 2013-11-06 LAB — HEPATITIS B SURFACE ANTIBODY,QUALITATIVE: Hep B S Ab: NEGATIVE

## 2013-11-06 LAB — VITAMIN D 25 HYDROXY (VIT D DEFICIENCY, FRACTURES): VIT D 25 HYDROXY: 94 ng/mL — AB (ref 30–89)

## 2013-11-06 LAB — MAGNESIUM: MAGNESIUM: 1.8 mg/dL (ref 1.5–2.5)

## 2013-11-06 LAB — HEPATITIS B CORE ANTIBODY, TOTAL: Hep B Core Total Ab: NONREACTIVE

## 2013-11-06 LAB — PSA: PSA: 0.63 ng/mL (ref <=4.00)

## 2013-11-06 LAB — VITAMIN B12: Vitamin B-12: 1289 pg/mL — ABNORMAL HIGH (ref 211–911)

## 2013-11-09 ENCOUNTER — Encounter (HOSPITAL_COMMUNITY)
Admission: RE | Admit: 2013-11-09 | Discharge: 2013-11-09 | Disposition: A | Discharge status: Home / Self Care | Payer: Self-pay | Source: Ambulatory Visit | Attending: Cardiovascular Disease | Admitting: Cardiovascular Disease

## 2013-11-09 LAB — HEPATITIS B E ANTIBODY: Hepatitis Be Antibody: NONREACTIVE

## 2013-11-11 ENCOUNTER — Encounter (HOSPITAL_COMMUNITY): Payer: Self-pay

## 2013-11-13 ENCOUNTER — Encounter (HOSPITAL_COMMUNITY)
Admission: RE | Admit: 2013-11-13 | Discharge: 2013-11-13 | Disposition: A | Discharge status: Home / Self Care | Payer: Self-pay | Source: Ambulatory Visit | Attending: Cardiovascular Disease | Admitting: Cardiovascular Disease

## 2013-11-16 ENCOUNTER — Encounter (HOSPITAL_COMMUNITY)
Admission: RE | Admit: 2013-11-16 | Discharge: 2013-11-16 | Disposition: A | Discharge status: Home / Self Care | Payer: Self-pay | Source: Ambulatory Visit | Attending: Cardiovascular Disease | Admitting: Cardiovascular Disease

## 2013-11-18 ENCOUNTER — Encounter (HOSPITAL_COMMUNITY)
Admission: RE | Admit: 2013-11-18 | Discharge: 2013-11-18 | Disposition: A | Discharge status: Home / Self Care | Payer: Self-pay | Source: Ambulatory Visit | Attending: Cardiovascular Disease | Admitting: Cardiovascular Disease

## 2013-11-20 ENCOUNTER — Encounter (HOSPITAL_COMMUNITY)
Admission: RE | Admit: 2013-11-20 | Discharge: 2013-11-20 | Disposition: A | Discharge status: Home / Self Care | Payer: Self-pay | Source: Ambulatory Visit | Attending: Cardiovascular Disease | Admitting: Cardiovascular Disease

## 2013-11-20 DIAGNOSIS — I2510 Atherosclerotic heart disease of native coronary artery without angina pectoris: Secondary | ICD-10-CM | POA: Insufficient documentation

## 2013-11-20 DIAGNOSIS — Z955 Presence of coronary angioplasty implant and graft: Secondary | ICD-10-CM | POA: Insufficient documentation

## 2013-11-23 ENCOUNTER — Encounter (HOSPITAL_COMMUNITY)
Admission: RE | Admit: 2013-11-23 | Discharge: 2013-11-23 | Disposition: A | Discharge status: Home / Self Care | Payer: Self-pay | Source: Ambulatory Visit | Attending: Cardiovascular Disease | Admitting: Cardiovascular Disease

## 2013-11-25 ENCOUNTER — Encounter (HOSPITAL_COMMUNITY)
Admission: RE | Admit: 2013-11-25 | Discharge: 2013-11-25 | Disposition: A | Discharge status: Home / Self Care | Payer: Self-pay | Source: Ambulatory Visit | Attending: Cardiovascular Disease | Admitting: Cardiovascular Disease

## 2013-11-27 ENCOUNTER — Encounter (HOSPITAL_COMMUNITY)
Admission: RE | Admit: 2013-11-27 | Discharge: 2013-11-27 | Disposition: A | Discharge status: Home / Self Care | Payer: Self-pay | Source: Ambulatory Visit | Attending: Cardiovascular Disease | Admitting: Cardiovascular Disease

## 2013-11-30 ENCOUNTER — Encounter (HOSPITAL_COMMUNITY): Payer: Self-pay

## 2013-12-02 ENCOUNTER — Encounter (HOSPITAL_COMMUNITY): Payer: Self-pay

## 2013-12-04 ENCOUNTER — Encounter (HOSPITAL_COMMUNITY): Payer: Self-pay

## 2013-12-07 ENCOUNTER — Encounter (HOSPITAL_COMMUNITY)
Admission: RE | Admit: 2013-12-07 | Discharge: 2013-12-07 | Disposition: A | Discharge status: Home / Self Care | Payer: 59 | Source: Ambulatory Visit | Attending: Cardiovascular Disease | Admitting: Cardiovascular Disease

## 2013-12-09 ENCOUNTER — Encounter (HOSPITAL_COMMUNITY)
Admission: RE | Admit: 2013-12-09 | Discharge: 2013-12-09 | Disposition: A | Discharge status: Home / Self Care | Payer: Self-pay | Source: Ambulatory Visit | Attending: Cardiovascular Disease | Admitting: Cardiovascular Disease

## 2013-12-11 ENCOUNTER — Encounter (HOSPITAL_COMMUNITY)
Admission: RE | Admit: 2013-12-11 | Discharge: 2013-12-11 | Disposition: A | Discharge status: Home / Self Care | Payer: Self-pay | Source: Ambulatory Visit | Attending: Cardiovascular Disease | Admitting: Cardiovascular Disease

## 2013-12-14 ENCOUNTER — Encounter (HOSPITAL_COMMUNITY)
Admission: RE | Admit: 2013-12-14 | Discharge: 2013-12-14 | Disposition: A | Discharge status: Home / Self Care | Payer: Self-pay | Source: Ambulatory Visit | Attending: Cardiovascular Disease | Admitting: Cardiovascular Disease

## 2013-12-16 ENCOUNTER — Encounter (HOSPITAL_COMMUNITY)
Admission: RE | Admit: 2013-12-16 | Discharge: 2013-12-16 | Disposition: A | Discharge status: Home / Self Care | Payer: Self-pay | Source: Ambulatory Visit | Attending: Cardiovascular Disease | Admitting: Cardiovascular Disease

## 2013-12-16 ENCOUNTER — Ambulatory Visit (INDEPENDENT_AMBULATORY_CARE_PROVIDER_SITE_OTHER): Payer: PRIVATE HEALTH INSURANCE | Admitting: *Deleted

## 2013-12-16 DIAGNOSIS — Z23 Encounter for immunization: Secondary | ICD-10-CM

## 2013-12-18 ENCOUNTER — Encounter (HOSPITAL_COMMUNITY)
Admission: RE | Admit: 2013-12-18 | Discharge: 2013-12-18 | Disposition: A | Discharge status: Home / Self Care | Payer: Self-pay | Source: Ambulatory Visit | Attending: Cardiovascular Disease | Admitting: Cardiovascular Disease

## 2013-12-21 ENCOUNTER — Encounter (HOSPITAL_COMMUNITY)
Admission: RE | Admit: 2013-12-21 | Discharge: 2013-12-21 | Disposition: A | Discharge status: Home / Self Care | Payer: Self-pay | Source: Ambulatory Visit | Attending: Cardiovascular Disease | Admitting: Cardiovascular Disease

## 2013-12-21 DIAGNOSIS — Z955 Presence of coronary angioplasty implant and graft: Secondary | ICD-10-CM | POA: Insufficient documentation

## 2013-12-21 DIAGNOSIS — I2510 Atherosclerotic heart disease of native coronary artery without angina pectoris: Secondary | ICD-10-CM | POA: Insufficient documentation

## 2013-12-23 ENCOUNTER — Encounter (HOSPITAL_COMMUNITY): Payer: Self-pay

## 2013-12-25 ENCOUNTER — Encounter (HOSPITAL_COMMUNITY): Payer: Self-pay

## 2013-12-28 ENCOUNTER — Encounter (HOSPITAL_COMMUNITY): Payer: Self-pay

## 2013-12-28 ENCOUNTER — Other Ambulatory Visit: Payer: Self-pay | Admitting: Emergency Medicine

## 2013-12-30 ENCOUNTER — Encounter (HOSPITAL_COMMUNITY)
Admission: RE | Admit: 2013-12-30 | Discharge: 2013-12-30 | Disposition: A | Discharge status: Home / Self Care | Payer: Self-pay | Source: Ambulatory Visit | Attending: Cardiovascular Disease | Admitting: Cardiovascular Disease

## 2014-01-01 ENCOUNTER — Encounter (HOSPITAL_COMMUNITY): Payer: Self-pay

## 2014-01-04 ENCOUNTER — Encounter (HOSPITAL_COMMUNITY)
Admission: RE | Admit: 2014-01-04 | Discharge: 2014-01-04 | Disposition: A | Discharge status: Home / Self Care | Payer: Self-pay | Source: Ambulatory Visit | Attending: Cardiovascular Disease | Admitting: Cardiovascular Disease

## 2014-01-06 ENCOUNTER — Encounter (HOSPITAL_COMMUNITY)
Admission: RE | Admit: 2014-01-06 | Discharge: 2014-01-06 | Disposition: A | Discharge status: Home / Self Care | Payer: Self-pay | Source: Ambulatory Visit | Attending: Cardiovascular Disease | Admitting: Cardiovascular Disease

## 2014-01-08 ENCOUNTER — Encounter (HOSPITAL_COMMUNITY)
Admission: RE | Admit: 2014-01-08 | Discharge: 2014-01-08 | Disposition: A | Discharge status: Home / Self Care | Payer: Self-pay | Source: Ambulatory Visit | Attending: Cardiovascular Disease | Admitting: Cardiovascular Disease

## 2014-01-11 ENCOUNTER — Encounter (HOSPITAL_COMMUNITY)
Admission: RE | Admit: 2014-01-11 | Discharge: 2014-01-11 | Disposition: A | Discharge status: Home / Self Care | Payer: Self-pay | Source: Ambulatory Visit | Attending: Cardiovascular Disease | Admitting: Cardiovascular Disease

## 2014-01-13 ENCOUNTER — Encounter (HOSPITAL_COMMUNITY): Payer: Self-pay

## 2014-01-18 ENCOUNTER — Encounter (HOSPITAL_COMMUNITY)
Admission: RE | Admit: 2014-01-18 | Discharge: 2014-01-18 | Disposition: A | Discharge status: Home / Self Care | Payer: Self-pay | Source: Ambulatory Visit | Attending: Cardiovascular Disease | Admitting: Cardiovascular Disease

## 2014-01-20 ENCOUNTER — Encounter (HOSPITAL_COMMUNITY)
Admission: RE | Admit: 2014-01-20 | Discharge: 2014-01-20 | Disposition: A | Discharge status: Home / Self Care | Payer: Self-pay | Source: Ambulatory Visit | Attending: Cardiovascular Disease | Admitting: Cardiovascular Disease

## 2014-01-20 DIAGNOSIS — Z5189 Encounter for other specified aftercare: Secondary | ICD-10-CM | POA: Insufficient documentation

## 2014-01-20 DIAGNOSIS — I10 Essential (primary) hypertension: Secondary | ICD-10-CM | POA: Insufficient documentation

## 2014-01-20 DIAGNOSIS — I472 Ventricular tachycardia: Secondary | ICD-10-CM | POA: Insufficient documentation

## 2014-01-20 DIAGNOSIS — E785 Hyperlipidemia, unspecified: Secondary | ICD-10-CM | POA: Insufficient documentation

## 2014-01-20 DIAGNOSIS — I2510 Atherosclerotic heart disease of native coronary artery without angina pectoris: Secondary | ICD-10-CM | POA: Insufficient documentation

## 2014-01-20 DIAGNOSIS — I4892 Unspecified atrial flutter: Secondary | ICD-10-CM | POA: Insufficient documentation

## 2014-01-20 DIAGNOSIS — Z955 Presence of coronary angioplasty implant and graft: Secondary | ICD-10-CM | POA: Insufficient documentation

## 2014-01-22 ENCOUNTER — Encounter (HOSPITAL_COMMUNITY)
Admission: RE | Admit: 2014-01-22 | Discharge: 2014-01-22 | Disposition: A | Discharge status: Home / Self Care | Payer: Self-pay | Source: Ambulatory Visit | Attending: Cardiovascular Disease | Admitting: Cardiovascular Disease

## 2014-01-25 ENCOUNTER — Encounter (HOSPITAL_COMMUNITY)
Admission: RE | Admit: 2014-01-25 | Discharge: 2014-01-25 | Disposition: A | Discharge status: Home / Self Care | Payer: Self-pay | Source: Ambulatory Visit | Attending: Cardiovascular Disease | Admitting: Cardiovascular Disease

## 2014-01-27 ENCOUNTER — Encounter (HOSPITAL_COMMUNITY): Payer: Self-pay

## 2014-01-28 ENCOUNTER — Encounter (HOSPITAL_COMMUNITY): Payer: Self-pay | Admitting: Cardiovascular Disease

## 2014-01-29 ENCOUNTER — Encounter (HOSPITAL_COMMUNITY)
Admission: RE | Admit: 2014-01-29 | Discharge: 2014-01-29 | Disposition: A | Discharge status: Home / Self Care | Payer: Self-pay | Source: Ambulatory Visit | Attending: Cardiovascular Disease | Admitting: Cardiovascular Disease

## 2014-02-01 ENCOUNTER — Encounter (HOSPITAL_COMMUNITY)
Admission: RE | Admit: 2014-02-01 | Discharge: 2014-02-01 | Disposition: A | Discharge status: Home / Self Care | Payer: Self-pay | Source: Ambulatory Visit | Attending: Cardiovascular Disease | Admitting: Cardiovascular Disease

## 2014-02-03 ENCOUNTER — Other Ambulatory Visit: Payer: Self-pay | Admitting: Physician Assistant

## 2014-02-03 ENCOUNTER — Encounter (HOSPITAL_COMMUNITY)
Admission: RE | Admit: 2014-02-03 | Discharge: 2014-02-03 | Disposition: A | Discharge status: Home / Self Care | Payer: Self-pay | Source: Ambulatory Visit | Attending: Cardiovascular Disease | Admitting: Cardiovascular Disease

## 2014-02-03 ENCOUNTER — Other Ambulatory Visit: Payer: Self-pay

## 2014-02-03 DIAGNOSIS — I10 Essential (primary) hypertension: Secondary | ICD-10-CM

## 2014-02-03 DIAGNOSIS — I2510 Atherosclerotic heart disease of native coronary artery without angina pectoris: Secondary | ICD-10-CM

## 2014-02-03 MED ORDER — ATENOLOL 100 MG PO TABS: 100.0000 mg | ORAL_TABLET | Freq: Every day | ORAL | Status: DC

## 2014-02-03 MED ORDER — CLOPIDOGREL BISULFATE 75 MG PO TABS: 75.0000 mg | ORAL_TABLET | Freq: Every day | ORAL | Status: DC

## 2014-02-04 ENCOUNTER — Other Ambulatory Visit: Payer: Self-pay

## 2014-02-04 MED ORDER — PANTOPRAZOLE SODIUM 40 MG PO TBEC: 40.0000 mg | DELAYED_RELEASE_TABLET | Freq: Every day | ORAL | Status: DC

## 2014-02-04 NOTE — Telephone Encounter (Signed)
Rx was sent to pharmacy electronically. 

## 2014-02-05 ENCOUNTER — Encounter (HOSPITAL_COMMUNITY)
Admission: RE | Admit: 2014-02-05 | Discharge: 2014-02-05 | Disposition: A | Discharge status: Home / Self Care | Payer: Self-pay | Source: Ambulatory Visit | Attending: Cardiovascular Disease | Admitting: Cardiovascular Disease

## 2014-02-08 ENCOUNTER — Encounter (HOSPITAL_COMMUNITY)
Admission: RE | Admit: 2014-02-08 | Discharge: 2014-02-08 | Disposition: A | Discharge status: Home / Self Care | Payer: Self-pay | Source: Ambulatory Visit | Attending: Cardiovascular Disease | Admitting: Cardiovascular Disease

## 2014-02-09 ENCOUNTER — Other Ambulatory Visit: Payer: Self-pay | Admitting: Internal Medicine

## 2014-02-10 ENCOUNTER — Encounter (HOSPITAL_COMMUNITY)
Admission: RE | Admit: 2014-02-10 | Discharge: 2014-02-10 | Disposition: A | Discharge status: Home / Self Care | Payer: Self-pay | Source: Ambulatory Visit | Attending: Cardiovascular Disease | Admitting: Cardiovascular Disease

## 2014-02-15 ENCOUNTER — Encounter (HOSPITAL_COMMUNITY): Payer: Self-pay

## 2014-02-17 ENCOUNTER — Encounter (HOSPITAL_COMMUNITY): Payer: Self-pay

## 2014-02-22 ENCOUNTER — Encounter (HOSPITAL_COMMUNITY)
Admission: RE | Admit: 2014-02-22 | Discharge: 2014-02-22 | Disposition: A | Discharge status: Home / Self Care | Payer: Self-pay | Source: Ambulatory Visit | Attending: Cardiovascular Disease | Admitting: Cardiovascular Disease

## 2014-02-22 DIAGNOSIS — Z5189 Encounter for other specified aftercare: Secondary | ICD-10-CM | POA: Insufficient documentation

## 2014-02-22 DIAGNOSIS — Z955 Presence of coronary angioplasty implant and graft: Secondary | ICD-10-CM | POA: Insufficient documentation

## 2014-02-22 DIAGNOSIS — I2510 Atherosclerotic heart disease of native coronary artery without angina pectoris: Secondary | ICD-10-CM | POA: Insufficient documentation

## 2014-02-22 DIAGNOSIS — I472 Ventricular tachycardia: Secondary | ICD-10-CM | POA: Insufficient documentation

## 2014-02-22 DIAGNOSIS — I10 Essential (primary) hypertension: Secondary | ICD-10-CM | POA: Insufficient documentation

## 2014-02-22 DIAGNOSIS — I4892 Unspecified atrial flutter: Secondary | ICD-10-CM | POA: Insufficient documentation

## 2014-02-22 DIAGNOSIS — E785 Hyperlipidemia, unspecified: Secondary | ICD-10-CM | POA: Insufficient documentation

## 2014-02-23 ENCOUNTER — Encounter: Payer: Self-pay | Admitting: Physician Assistant

## 2014-02-23 ENCOUNTER — Ambulatory Visit (INDEPENDENT_AMBULATORY_CARE_PROVIDER_SITE_OTHER): Payer: 59 | Admitting: Physician Assistant

## 2014-02-23 VITALS — BP 132/82 | HR 60 | Temp 97.7°F | Resp 16 | Ht 68.0 in | Wt 211.0 lb

## 2014-02-23 DIAGNOSIS — E291 Testicular hypofunction: Secondary | ICD-10-CM

## 2014-02-23 DIAGNOSIS — E785 Hyperlipidemia, unspecified: Secondary | ICD-10-CM

## 2014-02-23 DIAGNOSIS — I10 Essential (primary) hypertension: Secondary | ICD-10-CM

## 2014-02-23 DIAGNOSIS — I2510 Atherosclerotic heart disease of native coronary artery without angina pectoris: Secondary | ICD-10-CM

## 2014-02-23 DIAGNOSIS — R7309 Other abnormal glucose: Secondary | ICD-10-CM

## 2014-02-23 DIAGNOSIS — R7303 Prediabetes: Secondary | ICD-10-CM

## 2014-02-23 DIAGNOSIS — E559 Vitamin D deficiency, unspecified: Secondary | ICD-10-CM

## 2014-02-23 DIAGNOSIS — Z79899 Other long term (current) drug therapy: Secondary | ICD-10-CM

## 2014-02-23 LAB — CBC WITH DIFFERENTIAL/PLATELET
Basophils Absolute: 0 10*3/uL (ref 0.0–0.1)
Basophils Relative: 0 % (ref 0–1)
EOS PCT: 3 % (ref 0–5)
Eosinophils Absolute: 0.2 10*3/uL (ref 0.0–0.7)
HCT: 46.6 % (ref 39.0–52.0)
HEMOGLOBIN: 15.8 g/dL (ref 13.0–17.0)
LYMPHS ABS: 2 10*3/uL (ref 0.7–4.0)
Lymphocytes Relative: 31 % (ref 12–46)
MCH: 33 pg (ref 26.0–34.0)
MCHC: 33.9 g/dL (ref 30.0–36.0)
MCV: 97.3 fL (ref 78.0–100.0)
MONOS PCT: 13 % — AB (ref 3–12)
MPV: 10.2 fL (ref 8.6–12.4)
Monocytes Absolute: 0.8 10*3/uL (ref 0.1–1.0)
Neutro Abs: 3.4 10*3/uL (ref 1.7–7.7)
Neutrophils Relative %: 53 % (ref 43–77)
Platelets: 196 10*3/uL (ref 150–400)
RBC: 4.79 MIL/uL (ref 4.22–5.81)
RDW: 12.8 % (ref 11.5–15.5)
WBC: 6.4 10*3/uL (ref 4.0–10.5)

## 2014-02-23 NOTE — Patient Instructions (Signed)
    Bad carbs also include fruit juice, alcohol, and sweet tea. These are empty calories that do not signal to your brain that you are full.   Please remember the good carbs are still carbs which convert into sugar. So please measure them out no more than 1/2-1 cup of rice, oatmeal, pasta, and beans  Veggies are however free foods! Pile them on.   Not all fruit is created equal. Please see the list below, the fruit at the bottom is higher in sugars than the fruit at the top. Please avoid all dried fruits.     Your LDL is not in range. Your LDL is the bad cholesterol that can lead to heart attack and stroke. To lower your number you can decrease your fatty foods, red meat, cheese, milk and increase fiber like whole grains and veggies. You can also add a fiber supplement like Metamucil or Benefiber.

## 2014-02-23 NOTE — Progress Notes (Signed)
Assessment and Plan:  Hypertension: Continue medication, monitor blood pressure at home. Continue DASH diet.  Reminder to go to the ER if any CP, SOB, nausea, dizziness, severe HA, changes vision/speech, left arm numbness and tingling, and jaw pain. Cholesterol: Continue diet and exercise. Check cholesterol.  Pre-diabetes-Continue diet and exercise. Check A1C Vitamin D Def- check level and continue medications.  CAD-Control blood pressure, cholesterol, glucose, increase exercise.  Obesity with co morbidities- long discussion about weight loss, diet, and exercise Hypogonadism- continue replacement therapy, check testosterone levels as needed.   Continue diet and meds as discussed. Further disposition pending results of labs.  HPI 59 y.o. male  presents for 3 month follow up with hypertension, hyperlipidemia, prediabetes and vitamin D. His blood pressure has been controlled at home, today their BP is BP: 132/82 mmHg He does workout. He denies chest pain, shortness of breath, dizziness.  He has CAD, had stent in Nov 2011, on plavix and bASA, no cardiac symptoms.  He is on cholesterol medication, fenofibrate and lipitor 40mg  and denies myalgias. His cholesterol is not at goal. The cholesterol last visit was:   Lab Results  Component Value Date   CHOL 161 11/05/2013   HDL 57 11/05/2013   LDLCALC 83 11/05/2013   TRIG 107 11/05/2013   CHOLHDL 2.8 11/05/2013   He has been working on diet and exercise for prediabetes, and denies paresthesia of the feet, polydipsia, polyuria and visual disturbances. Last A1C in the office was:  Lab Results  Component Value Date   HGBA1C 5.7* 11/05/2013  Patient is on Vitamin D supplement.   Lab Results  Component Value Date   VD25OH 94* 11/05/2013  BMI is Body mass index is 32.09 kg/(m^2)., he is working on diet and exercise. Wt Readings from Last 3 Encounters:  02/23/14 211 lb (95.709 kg)  11/05/13 205 lb (92.987 kg)  08/03/13 198 lb (89.812 kg)  He has  a history of testosterone deficiency and is on testosterone replacement, last injection was 12-13 days, 2 cc. He states that the testosterone helps with his energy, libido, muscle mass. Lab Results  Component Value Date   TESTOSTERONE 175* 11/05/2013     Current Medications:  Current Outpatient Prescriptions on File Prior to Visit  Medication Sig Dispense Refill  . ALPRAZolam (XANAX) 1 MG tablet Take 1 tablet (1 mg total) by mouth 3 (three) times daily as needed. For anxiety 90 tablet 3  . aspirin 81 MG tablet Take 81 mg by mouth daily.    Marland Kitchen atenolol (TENORMIN) 100 MG tablet Take 1 tablet (100 mg total) by mouth daily. 90 tablet prn  . atorvastatin (LIPITOR) 40 MG tablet Take 1 tablet by mouth  every day for cholesterol 90 tablet 2  . b complex vitamins tablet Take 1 tablet by mouth daily.    . B-D 3CC LUER-LOK SYR 21GX1" 21G X 1" 3 ML MISC USE WITH TESTOSTERONE AS DIRECTED 12 each 3  . BENICAR 40 MG tablet Take 1 tablet by mouth  daily for blood pressure 90 tablet 2  . buPROPion (WELLBUTRIN XL) 300 MG 24 hr tablet Take 1 tablet by mouth  daily for mood 90 tablet 2  . clopidogrel (PLAVIX) 75 MG tablet Take 1 tablet (75 mg total) by mouth daily. 90 tablet prn  . diltiazem (CARDIZEM CD) 240 MG 24 hr capsule Take 1 capsule by mouth  daily for blood pressure 90 capsule 2  . fenofibrate 160 MG tablet Take 1 tablet (160 mg total) by mouth  daily. 90 tablet 2  . fenofibrate micronized (LOFIBRA) 134 MG capsule     . fish oil-omega-3 fatty acids 1000 MG capsule Take 1 g by mouth daily. Takes 3 gm daily    . furosemide (LASIX) 80 MG tablet Take 1 tablet by mouth  daily for blood pressure  and fluid 90 tablet 3  . meloxicam (MOBIC) 15 MG tablet TAKE 1 TABLET BY MOUTH EVERY DAY FOR PAIN/INFLAMATION 90 tablet 99  . Multiple Vitamin (MULTIVITAMIN WITH MINERALS) TABS Take 2 tablets by mouth daily.     . pantoprazole (PROTONIX) 40 MG tablet Take 1 tablet (40 mg total) by mouth daily. 90 tablet 0  .  testosterone cypionate (DEPOTESTOTERONE CYPIONATE) 200 MG/ML injection INJECT 2 MLS INTRAMUSCULARLY EVERY 2 WEEKS 10 mL 1   No current facility-administered medications on file prior to visit.   Medical History:  Past Medical History  Diagnosis Date  . Hypertension   . Hyperlipidemia   . Diverticulitis   . Hemorrhoids   . GERD (gastroesophageal reflux disease)   . Anxiety   . Meniscus tear 01/02/2011  . Right knee pain 01/02/2011  . Diverticulosis   . Hemarthrosis of knee, right 01/04/2011  . CAD (coronary artery disease), native coronary artery 01/04/2011  . Stented coronary artery 01/04/2011    LAD DES Resolute.  . Hyperlipemia 01/04/2011  . History of meniscectomy of right knee 01/02/2011  . Torn medial meniscus     right knee  . NSVT (nonsustained ventricular tachycardia) 01/02/2011  . Atrial flutter, paroxysmal 01/04/2011  . Arthritis    Allergies:  Allergies  Allergen Reactions  . Hctz [Hydrochlorothiazide]   . Ibuprofen   . Tape Rash    Clear tape     Review of Systems:  Review of Systems  Constitutional: Negative.   HENT: Negative.   Cardiovascular: Negative.   Gastrointestinal: Negative.   Genitourinary: Negative.   Musculoskeletal: Positive for joint pain (bilateral knee pain, has had injections. ). Negative for myalgias, back pain, falls and neck pain.  Skin: Negative.   Neurological: Negative.   Psychiatric/Behavioral: Negative.      Family history- Review and unchanged Social history- Review and unchanged Physical Exam: BP 132/82 mmHg  Pulse 60  Temp(Src) 97.7 F (36.5 C)  Resp 16  Ht 5\' 8"  (1.727 m)  Wt 211 lb (95.709 kg)  BMI 32.09 kg/m2 Wt Readings from Last 3 Encounters:  02/23/14 211 lb (95.709 kg)  11/05/13 205 lb (92.987 kg)  08/03/13 198 lb (89.812 kg)   General Appearance: Well nourished, in no apparent distress. Eyes: PERRLA, EOMs, conjunctiva no swelling or erythema Sinuses: No Frontal/maxillary tenderness ENT/Mouth: Ext  aud canals clear, TMs without erythema, bulging. No erythema, swelling, or exudate on post pharynx.  Tonsils not swollen or erythematous. Hearing normal.  Neck: Supple, thyroid normal.  Respiratory: Respiratory effort normal, BS equal bilaterally without rales, rhonchi, wheezing or stridor.  Cardio: RRR with no MRGs. Brisk peripheral pulses without edema.  Abdomen: Soft, + BS.  Non tender, no guarding, rebound, hernias, masses. Lymphatics: Non tender without lymphadenopathy.  Musculoskeletal: Full ROM, 5/5 strength, normal gait.  Skin: Warm, dry without rashes, lesions, ecchymosis.  Neuro: Cranial nerves intact. Normal muscle tone, no cerebellar symptoms. Sensation intact.  Psych: Awake and oriented X 3, normal affect, Insight and Judgment appropriate.    Vicie Mutters, PA-C 9:55 AM [facility]

## 2014-02-24 ENCOUNTER — Encounter (HOSPITAL_COMMUNITY): Payer: Self-pay

## 2014-02-24 LAB — INSULIN, FASTING: Insulin fasting, serum: 6.7 u[IU]/mL (ref 2.0–19.6)

## 2014-02-24 LAB — LIPID PANEL
CHOLESTEROL: 160 mg/dL (ref 0–200)
HDL: 55 mg/dL (ref >39)
LDL Cholesterol: 77 mg/dL (ref 0–99)
TRIGLYCERIDES: 139 mg/dL (ref <150)
Total CHOL/HDL Ratio: 2.9 Ratio
VLDL: 28 mg/dL (ref 0–40)

## 2014-02-24 LAB — HEPATIC FUNCTION PANEL
ALBUMIN: 4.7 g/dL (ref 3.5–5.2)
ALT: 31 U/L (ref 0–53)
AST: 35 U/L (ref 0–37)
Alkaline Phosphatase: 44 U/L (ref 39–117)
Bilirubin, Direct: 0.2 mg/dL (ref 0.0–0.3)
Indirect Bilirubin: 0.7 mg/dL (ref 0.2–1.2)
Total Bilirubin: 0.9 mg/dL (ref 0.2–1.2)
Total Protein: 7.2 g/dL (ref 6.0–8.3)

## 2014-02-24 LAB — BASIC METABOLIC PANEL WITH GFR
BUN: 24 mg/dL — ABNORMAL HIGH (ref 6–23)
CALCIUM: 9.9 mg/dL (ref 8.4–10.5)
CO2: 28 meq/L (ref 19–32)
Chloride: 103 mEq/L (ref 96–112)
Creat: 1.31 mg/dL (ref 0.50–1.35)
GFR, EST AFRICAN AMERICAN: 68 mL/min
GFR, Est Non African American: 59 mL/min — ABNORMAL LOW
Glucose, Bld: 96 mg/dL (ref 70–99)
POTASSIUM: 4.1 meq/L (ref 3.5–5.3)
SODIUM: 139 meq/L (ref 135–145)

## 2014-02-24 LAB — TSH: TSH: 1.299 u[IU]/mL (ref 0.350–4.500)

## 2014-02-24 LAB — TESTOSTERONE: Testosterone: 273 ng/dL — ABNORMAL LOW (ref 300–890)

## 2014-02-24 LAB — VITAMIN D 25 HYDROXY (VIT D DEFICIENCY, FRACTURES): VIT D 25 HYDROXY: 69 ng/mL (ref 30–100)

## 2014-02-24 LAB — MAGNESIUM: MAGNESIUM: 1.6 mg/dL (ref 1.5–2.5)

## 2014-02-24 LAB — HEMOGLOBIN A1C
Hgb A1c MFr Bld: 5.4 % (ref <5.7)
MEAN PLASMA GLUCOSE: 108 mg/dL (ref <117)

## 2014-02-26 ENCOUNTER — Encounter (HOSPITAL_COMMUNITY): Payer: Self-pay

## 2014-03-01 ENCOUNTER — Encounter (HOSPITAL_COMMUNITY)
Admission: RE | Admit: 2014-03-01 | Discharge: 2014-03-01 | Disposition: A | Discharge status: Home / Self Care | Payer: Self-pay | Source: Ambulatory Visit | Attending: Cardiovascular Disease | Admitting: Cardiovascular Disease

## 2014-03-03 ENCOUNTER — Encounter (HOSPITAL_COMMUNITY)
Admission: RE | Admit: 2014-03-03 | Discharge: 2014-03-03 | Disposition: A | Discharge status: Home / Self Care | Payer: Self-pay | Source: Ambulatory Visit | Attending: Cardiovascular Disease | Admitting: Cardiovascular Disease

## 2014-03-03 ENCOUNTER — Encounter: Payer: Self-pay | Admitting: Cardiovascular Disease

## 2014-03-03 ENCOUNTER — Ambulatory Visit (INDEPENDENT_AMBULATORY_CARE_PROVIDER_SITE_OTHER): Payer: 59 | Admitting: Cardiovascular Disease

## 2014-03-03 VITALS — BP 130/80 | HR 63 | Ht 68.0 in | Wt 206.3 lb

## 2014-03-03 DIAGNOSIS — I2510 Atherosclerotic heart disease of native coronary artery without angina pectoris: Secondary | ICD-10-CM

## 2014-03-03 DIAGNOSIS — I10 Essential (primary) hypertension: Secondary | ICD-10-CM

## 2014-03-03 DIAGNOSIS — E785 Hyperlipidemia, unspecified: Secondary | ICD-10-CM

## 2014-03-03 NOTE — Assessment & Plan Note (Signed)
History of hypertension with blood pressure measured today 130/80. He is on atenolol and Benicar. Continue current meds at current dosing

## 2014-03-03 NOTE — Assessment & Plan Note (Signed)
History of coronary artery disease status post cardiac catheterization performed by myself 01/03/11 revealing a 70% mid LAD lesion which was physiologically significant by FFR. I stented him with a Resolute  drug-eluting stent. He has been asymptomatic since.

## 2014-03-03 NOTE — Assessment & Plan Note (Signed)
history of hyperlipidemia recent lipid profile performed 02/23/14 revealed Enterobacter 160, LDL of 77 and HDL of 55, atorvastatin 40 mg, continue current meds at current dosing

## 2014-03-03 NOTE — Patient Instructions (Signed)
Your physician wants you to follow-up in 1 year with Dr. Berry. You will receive a reminder letter in the mail 2 months in advance. If you do not receive a letter, please call our office to schedule the follow-up appointment.  

## 2014-03-03 NOTE — Progress Notes (Signed)
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 Eddie Dibbles. I saw him 12 months ago. His risk factors include hypertension, hyperlipidemia and family history. His mother died of an MI at age 13 and father had CABG at age 54. He had a stress test in our office September 15, 2007 that showed mild anterolateral ischemia. He is having knee arthroscopy as an outpatient by Dr. Moshe Salisbury who noted 30 seconds of ventricular tachycardia. He was transferred to [facility] and I cathed him radially January 03, 2011 revealing a 70% mid LAD lesion which was found to be physiologically significant by FFR. I stented him with a Resolute drug-eluting stent. He has had no recurrent symptoms. He participates in cardiac rehab, phase 3 still and finds it helpful. His most recent lab work performed 02/23/14 revealed a total cholesterol of 160, LDL 77 HDL of 55.  Current Outpatient Prescriptions  Medication Sig Dispense Refill  . ALPRAZolam (XANAX) 1 MG tablet Take 1 tablet (1 mg total) by mouth 3 (three) times daily as needed. For anxiety 90 tablet 3  . aspirin 81 MG tablet Take 81 mg by mouth daily.    Marland Kitchen atenolol (TENORMIN) 100 MG tablet Take 1 tablet (100 mg total) by mouth daily. 90 tablet prn  . atorvastatin (LIPITOR) 40 MG tablet Take 1 tablet by mouth  every day for cholesterol 90 tablet 2  . b complex vitamins tablet Take 1 tablet by mouth daily.    . B-D 3CC LUER-LOK SYR 21GX1" 21G X 1" 3 ML MISC USE WITH TESTOSTERONE AS DIRECTED 12 each 3  . BENICAR 40 MG tablet Take 1 tablet by mouth  daily for blood pressure 90 tablet 2  . buPROPion (WELLBUTRIN XL) 300 MG 24 hr tablet Take 1 tablet by mouth  daily for mood 90 tablet 2  . clopidogrel (PLAVIX) 75  MG tablet Take 1 tablet (75 mg total) by mouth daily. 90 tablet prn  . diltiazem (CARDIZEM CD) 240 MG 24 hr capsule Take 1 capsule by mouth  daily for blood pressure 90 capsule 2  . fenofibrate 160 MG tablet Take 1 tablet (160 mg total) by mouth daily. 90 tablet 2  . fenofibrate micronized (LOFIBRA) 134 MG capsule     . fish oil-omega-3 fatty acids 1000 MG capsule Take 1 g by mouth daily. Takes 3 gm daily    . furosemide (LASIX) 80 MG tablet Take 1 tablet by mouth  daily for blood pressure  and fluid 90 tablet 3  . HYDROcodone-acetaminophen (NORCO) 10-325 MG per tablet   0  . meloxicam (MOBIC) 15 MG tablet TAKE 1 TABLET BY MOUTH EVERY DAY FOR PAIN/INFLAMATION 90 tablet 99  . Multiple Vitamin (MULTIVITAMIN WITH MINERALS) TABS Take 2 tablets by mouth daily.     . pantoprazole (PROTONIX) 40 MG tablet Take 1 tablet (40 mg total) by mouth daily. 90 tablet 0  . predniSONE (STERAPRED UNI-PAK) 10 MG tablet See admin instructions.  0  . testosterone cypionate (DEPOTESTOTERONE CYPIONATE) 200 MG/ML injection INJECT 2 MLS INTRAMUSCULARLY EVERY 2 WEEKS 10 mL 1   No current facility-administered medications for this visit.    Allergies  Allergen Reactions  . Hctz [Hydrochlorothiazide]   . Ibuprofen   . Tape Rash  Clear tape    History   Social History  . Marital Status: Married    Spouse Name: N/A    Number of Children: 2  . Years of Education: N/A   Occupational History  . Self Employed     Theatre manager  .     Social History Main Topics  . Smoking status: Never Smoker   . Smokeless tobacco: Never Used  . Alcohol Use: 6.0 oz/week    10 Cans of beer per week     Comment: 1-2 beers a night  . Drug Use: No  . Sexual Activity: Not on file   Other Topics Concern  . Not on file   Social History Narrative     Review of Systems: General: negative for chills, fever, night sweats or weight changes.  Cardiovascular: negative for chest pain, dyspnea on exertion, edema, orthopnea,  palpitations, paroxysmal nocturnal dyspnea or shortness of breath Dermatological: negative for rash Respiratory: negative for cough or wheezing Urologic: negative for hematuria Abdominal: negative for nausea, vomiting, diarrhea, bright red blood per rectum, melena, or hematemesis Neurologic: negative for visual changes, syncope, or dizziness All other systems reviewed and are otherwise negative except as noted above.    Blood pressure 130/80, pulse 63, height 5\' 8"  (1.727 m), weight 206 lb 4.8 oz (93.577 kg).  General appearance: alert and no distress Neck: no adenopathy, no carotid bruit, no JVD, supple, symmetrical, trachea midline and thyroid not enlarged, symmetric, no tenderness/mass/nodules Lungs: clear to auscultation bilaterally Heart: regular rate and rhythm, S1, S2 normal, no murmur, click, rub or gallop Extremities: extremities normal, atraumatic, no cyanosis or edema  EKG normal sinus rhythm at 63 without ST or T-wave changes. I personally reviewed this EKG  ASSESSMENT AND PLAN:   Hypertension History of hypertension with blood pressure measured today 130/80. He is on atenolol and Benicar. Continue current meds at current dosing   Hyperlipidemia history of hyperlipidemia recent lipid profile performed 02/23/14 revealed Enterobacter 160, LDL of 77 and HDL of 55, atorvastatin 40 mg, continue current meds at current dosing       Lorretta Harp MD [facility] 03/03/2014 2:22 PM

## 2014-03-05 ENCOUNTER — Encounter (HOSPITAL_COMMUNITY)
Admission: RE | Admit: 2014-03-05 | Discharge: 2014-03-05 | Disposition: A | Discharge status: Home / Self Care | Payer: Self-pay | Source: Ambulatory Visit | Attending: Cardiovascular Disease | Admitting: Cardiovascular Disease

## 2014-03-08 ENCOUNTER — Encounter (HOSPITAL_COMMUNITY): Payer: Self-pay

## 2014-03-10 ENCOUNTER — Encounter (HOSPITAL_COMMUNITY)
Admission: RE | Admit: 2014-03-10 | Discharge: 2014-03-10 | Disposition: A | Discharge status: Home / Self Care | Payer: Self-pay | Source: Ambulatory Visit | Attending: Cardiovascular Disease | Admitting: Cardiovascular Disease

## 2014-03-12 ENCOUNTER — Encounter (HOSPITAL_COMMUNITY): Payer: Self-pay

## 2014-03-15 ENCOUNTER — Encounter (HOSPITAL_COMMUNITY)
Admission: RE | Admit: 2014-03-15 | Discharge: 2014-03-15 | Disposition: A | Discharge status: Home / Self Care | Payer: Self-pay | Source: Ambulatory Visit | Attending: Cardiovascular Disease | Admitting: Cardiovascular Disease

## 2014-03-17 ENCOUNTER — Encounter (HOSPITAL_COMMUNITY)
Admission: RE | Admit: 2014-03-17 | Discharge: 2014-03-17 | Disposition: A | Discharge status: Home / Self Care | Payer: Self-pay | Source: Ambulatory Visit | Attending: Cardiovascular Disease | Admitting: Cardiovascular Disease

## 2014-03-19 ENCOUNTER — Encounter (HOSPITAL_COMMUNITY): Payer: Self-pay

## 2014-03-22 ENCOUNTER — Encounter (HOSPITAL_COMMUNITY)
Admission: RE | Admit: 2014-03-22 | Discharge: 2014-03-22 | Disposition: A | Discharge status: Home / Self Care | Payer: Self-pay | Source: Ambulatory Visit | Attending: Cardiovascular Disease | Admitting: Cardiovascular Disease

## 2014-03-22 DIAGNOSIS — E785 Hyperlipidemia, unspecified: Secondary | ICD-10-CM | POA: Insufficient documentation

## 2014-03-22 DIAGNOSIS — I2510 Atherosclerotic heart disease of native coronary artery without angina pectoris: Secondary | ICD-10-CM | POA: Insufficient documentation

## 2014-03-22 DIAGNOSIS — I10 Essential (primary) hypertension: Secondary | ICD-10-CM | POA: Insufficient documentation

## 2014-03-22 DIAGNOSIS — I472 Ventricular tachycardia: Secondary | ICD-10-CM | POA: Insufficient documentation

## 2014-03-22 DIAGNOSIS — Z955 Presence of coronary angioplasty implant and graft: Secondary | ICD-10-CM | POA: Insufficient documentation

## 2014-03-22 DIAGNOSIS — I4892 Unspecified atrial flutter: Secondary | ICD-10-CM | POA: Insufficient documentation

## 2014-03-22 DIAGNOSIS — Z5189 Encounter for other specified aftercare: Secondary | ICD-10-CM | POA: Insufficient documentation

## 2014-03-24 ENCOUNTER — Encounter (HOSPITAL_COMMUNITY)
Admission: RE | Admit: 2014-03-24 | Discharge: 2014-03-24 | Disposition: A | Discharge status: Home / Self Care | Payer: Self-pay | Source: Ambulatory Visit | Attending: Cardiovascular Disease | Admitting: Cardiovascular Disease

## 2014-03-26 ENCOUNTER — Encounter (HOSPITAL_COMMUNITY)
Admission: RE | Admit: 2014-03-26 | Discharge: 2014-03-26 | Disposition: A | Discharge status: Home / Self Care | Payer: Self-pay | Source: Ambulatory Visit | Attending: Cardiovascular Disease | Admitting: Cardiovascular Disease

## 2014-03-29 ENCOUNTER — Encounter (HOSPITAL_COMMUNITY)
Admission: RE | Admit: 2014-03-29 | Discharge: 2014-03-29 | Disposition: A | Discharge status: Home / Self Care | Payer: Self-pay | Source: Ambulatory Visit | Attending: Cardiovascular Disease | Admitting: Cardiovascular Disease

## 2014-03-31 ENCOUNTER — Encounter (HOSPITAL_COMMUNITY)
Admission: RE | Admit: 2014-03-31 | Discharge: 2014-03-31 | Disposition: A | Discharge status: Home / Self Care | Payer: Self-pay | Source: Ambulatory Visit | Attending: Cardiovascular Disease | Admitting: Cardiovascular Disease

## 2014-04-02 ENCOUNTER — Encounter (HOSPITAL_COMMUNITY)
Admission: RE | Admit: 2014-04-02 | Discharge: 2014-04-02 | Disposition: A | Discharge status: Home / Self Care | Payer: Self-pay | Source: Ambulatory Visit | Attending: Cardiovascular Disease | Admitting: Cardiovascular Disease

## 2014-04-05 ENCOUNTER — Encounter (HOSPITAL_COMMUNITY)
Admission: RE | Admit: 2014-04-05 | Discharge: 2014-04-05 | Disposition: A | Discharge status: Home / Self Care | Payer: Self-pay | Source: Ambulatory Visit | Attending: Cardiovascular Disease | Admitting: Cardiovascular Disease

## 2014-04-07 ENCOUNTER — Encounter (HOSPITAL_COMMUNITY)
Admission: RE | Admit: 2014-04-07 | Discharge: 2014-04-07 | Disposition: A | Discharge status: Home / Self Care | Payer: Self-pay | Source: Ambulatory Visit | Attending: Cardiovascular Disease | Admitting: Cardiovascular Disease

## 2014-04-09 ENCOUNTER — Encounter (HOSPITAL_COMMUNITY)
Admission: RE | Admit: 2014-04-09 | Discharge: 2014-04-09 | Disposition: A | Discharge status: Home / Self Care | Payer: Self-pay | Source: Ambulatory Visit | Attending: Cardiovascular Disease | Admitting: Cardiovascular Disease

## 2014-04-09 ENCOUNTER — Other Ambulatory Visit: Payer: Self-pay | Admitting: Cardiovascular Disease

## 2014-04-09 NOTE — Telephone Encounter (Signed)
Rx(s) sent to pharmacy electronically.  

## 2014-04-12 ENCOUNTER — Encounter (HOSPITAL_COMMUNITY)
Admission: RE | Admit: 2014-04-12 | Discharge: 2014-04-12 | Disposition: A | Discharge status: Home / Self Care | Payer: Self-pay | Source: Ambulatory Visit | Attending: Cardiovascular Disease | Admitting: Cardiovascular Disease

## 2014-04-14 ENCOUNTER — Encounter (HOSPITAL_COMMUNITY)
Admission: RE | Admit: 2014-04-14 | Discharge: 2014-04-14 | Disposition: A | Discharge status: Home / Self Care | Payer: Self-pay | Source: Ambulatory Visit | Attending: Cardiovascular Disease | Admitting: Cardiovascular Disease

## 2014-04-16 ENCOUNTER — Encounter (HOSPITAL_COMMUNITY)
Admission: RE | Admit: 2014-04-16 | Discharge: 2014-04-16 | Disposition: A | Discharge status: Home / Self Care | Payer: Self-pay | Source: Ambulatory Visit | Attending: Cardiovascular Disease | Admitting: Cardiovascular Disease

## 2014-04-19 ENCOUNTER — Encounter (HOSPITAL_COMMUNITY)
Admission: RE | Admit: 2014-04-19 | Discharge: 2014-04-19 | Disposition: A | Discharge status: Home / Self Care | Payer: Self-pay | Source: Ambulatory Visit | Attending: Cardiovascular Disease | Admitting: Cardiovascular Disease

## 2014-04-21 ENCOUNTER — Encounter (HOSPITAL_COMMUNITY)
Admission: RE | Admit: 2014-04-21 | Discharge: 2014-04-21 | Disposition: A | Discharge status: Home / Self Care | Payer: Self-pay | Source: Ambulatory Visit | Attending: Cardiovascular Disease | Admitting: Cardiovascular Disease

## 2014-04-21 DIAGNOSIS — I10 Essential (primary) hypertension: Secondary | ICD-10-CM | POA: Insufficient documentation

## 2014-04-21 DIAGNOSIS — I472 Ventricular tachycardia: Secondary | ICD-10-CM | POA: Insufficient documentation

## 2014-04-21 DIAGNOSIS — Z5189 Encounter for other specified aftercare: Secondary | ICD-10-CM | POA: Insufficient documentation

## 2014-04-21 DIAGNOSIS — Z955 Presence of coronary angioplasty implant and graft: Secondary | ICD-10-CM | POA: Insufficient documentation

## 2014-04-21 DIAGNOSIS — I4892 Unspecified atrial flutter: Secondary | ICD-10-CM | POA: Insufficient documentation

## 2014-04-21 DIAGNOSIS — I2510 Atherosclerotic heart disease of native coronary artery without angina pectoris: Secondary | ICD-10-CM | POA: Insufficient documentation

## 2014-04-21 DIAGNOSIS — E785 Hyperlipidemia, unspecified: Secondary | ICD-10-CM | POA: Insufficient documentation

## 2014-04-23 ENCOUNTER — Encounter (HOSPITAL_COMMUNITY)
Admission: RE | Admit: 2014-04-23 | Discharge: 2014-04-23 | Disposition: A | Discharge status: Home / Self Care | Payer: Self-pay | Source: Ambulatory Visit | Attending: Cardiovascular Disease | Admitting: Cardiovascular Disease

## 2014-04-26 ENCOUNTER — Encounter (HOSPITAL_COMMUNITY)
Admission: RE | Admit: 2014-04-26 | Discharge: 2014-04-26 | Disposition: A | Discharge status: Home / Self Care | Payer: Self-pay | Source: Ambulatory Visit | Attending: Cardiovascular Disease | Admitting: Cardiovascular Disease

## 2014-04-28 ENCOUNTER — Encounter (HOSPITAL_COMMUNITY)
Admission: RE | Admit: 2014-04-28 | Discharge: 2014-04-28 | Disposition: A | Discharge status: Home / Self Care | Payer: Self-pay | Source: Ambulatory Visit | Attending: Cardiovascular Disease | Admitting: Cardiovascular Disease

## 2014-04-30 ENCOUNTER — Encounter (HOSPITAL_COMMUNITY)
Admission: RE | Admit: 2014-04-30 | Discharge: 2014-04-30 | Disposition: A | Discharge status: Home / Self Care | Payer: Self-pay | Source: Ambulatory Visit | Attending: Cardiovascular Disease | Admitting: Cardiovascular Disease

## 2014-05-03 ENCOUNTER — Encounter (HOSPITAL_COMMUNITY)
Admission: RE | Admit: 2014-05-03 | Discharge: 2014-05-03 | Disposition: A | Discharge status: Home / Self Care | Payer: Self-pay | Source: Ambulatory Visit | Attending: Cardiovascular Disease | Admitting: Cardiovascular Disease

## 2014-05-05 ENCOUNTER — Encounter (HOSPITAL_COMMUNITY)
Admission: RE | Admit: 2014-05-05 | Discharge: 2014-05-05 | Disposition: A | Discharge status: Home / Self Care | Payer: Self-pay | Source: Ambulatory Visit | Attending: Cardiovascular Disease | Admitting: Cardiovascular Disease

## 2014-05-07 ENCOUNTER — Encounter (HOSPITAL_COMMUNITY)
Admission: RE | Admit: 2014-05-07 | Discharge: 2014-05-07 | Disposition: A | Discharge status: Home / Self Care | Payer: Self-pay | Source: Ambulatory Visit | Attending: Cardiovascular Disease | Admitting: Cardiovascular Disease

## 2014-05-07 ENCOUNTER — Other Ambulatory Visit: Payer: Self-pay | Admitting: Physician Assistant

## 2014-05-07 MED ORDER — TESTOSTERONE CYPIONATE 200 MG/ML IM SOLN: INTRAMUSCULAR | Status: DC

## 2014-05-10 ENCOUNTER — Encounter (HOSPITAL_COMMUNITY)
Admission: RE | Admit: 2014-05-10 | Discharge: 2014-05-10 | Disposition: A | Discharge status: Home / Self Care | Payer: Self-pay | Source: Ambulatory Visit | Attending: Cardiovascular Disease | Admitting: Cardiovascular Disease

## 2014-05-12 ENCOUNTER — Encounter (HOSPITAL_COMMUNITY): Payer: Self-pay

## 2014-05-14 ENCOUNTER — Encounter (HOSPITAL_COMMUNITY)
Admission: RE | Admit: 2014-05-14 | Discharge: 2014-05-14 | Disposition: A | Discharge status: Home / Self Care | Payer: Self-pay | Source: Ambulatory Visit | Attending: Cardiovascular Disease | Admitting: Cardiovascular Disease

## 2014-05-17 ENCOUNTER — Encounter (HOSPITAL_COMMUNITY)
Admission: RE | Admit: 2014-05-17 | Discharge: 2014-05-17 | Disposition: A | Discharge status: Home / Self Care | Payer: Self-pay | Source: Ambulatory Visit | Attending: Cardiovascular Disease | Admitting: Cardiovascular Disease

## 2014-05-19 ENCOUNTER — Encounter (HOSPITAL_COMMUNITY): Payer: Self-pay

## 2014-05-21 ENCOUNTER — Encounter (HOSPITAL_COMMUNITY)
Admission: RE | Admit: 2014-05-21 | Discharge: 2014-05-21 | Disposition: A | Discharge status: Home / Self Care | Payer: Self-pay | Source: Ambulatory Visit | Attending: Cardiovascular Disease | Admitting: Cardiovascular Disease

## 2014-05-21 DIAGNOSIS — E785 Hyperlipidemia, unspecified: Secondary | ICD-10-CM | POA: Insufficient documentation

## 2014-05-21 DIAGNOSIS — I2510 Atherosclerotic heart disease of native coronary artery without angina pectoris: Secondary | ICD-10-CM | POA: Insufficient documentation

## 2014-05-21 DIAGNOSIS — Z955 Presence of coronary angioplasty implant and graft: Secondary | ICD-10-CM | POA: Insufficient documentation

## 2014-05-21 DIAGNOSIS — I10 Essential (primary) hypertension: Secondary | ICD-10-CM | POA: Insufficient documentation

## 2014-05-21 DIAGNOSIS — I472 Ventricular tachycardia: Secondary | ICD-10-CM | POA: Insufficient documentation

## 2014-05-21 DIAGNOSIS — I4892 Unspecified atrial flutter: Secondary | ICD-10-CM | POA: Insufficient documentation

## 2014-05-21 DIAGNOSIS — Z5189 Encounter for other specified aftercare: Secondary | ICD-10-CM | POA: Insufficient documentation

## 2014-05-24 ENCOUNTER — Encounter (HOSPITAL_COMMUNITY)
Admission: RE | Admit: 2014-05-24 | Discharge: 2014-05-24 | Disposition: A | Discharge status: Home / Self Care | Payer: Self-pay | Source: Ambulatory Visit | Attending: Cardiovascular Disease | Admitting: Cardiovascular Disease

## 2014-05-24 ENCOUNTER — Other Ambulatory Visit: Payer: 59

## 2014-05-24 DIAGNOSIS — E291 Testicular hypofunction: Secondary | ICD-10-CM

## 2014-05-25 LAB — TESTOSTERONE: Testosterone: 399 ng/dL (ref 300–890)

## 2014-05-26 ENCOUNTER — Encounter (HOSPITAL_COMMUNITY)
Admission: RE | Admit: 2014-05-26 | Discharge: 2014-05-26 | Disposition: A | Discharge status: Home / Self Care | Payer: Self-pay | Source: Ambulatory Visit | Attending: Cardiovascular Disease | Admitting: Cardiovascular Disease

## 2014-05-28 ENCOUNTER — Encounter (HOSPITAL_COMMUNITY): Payer: Self-pay

## 2014-05-31 ENCOUNTER — Encounter (HOSPITAL_COMMUNITY)
Admission: RE | Admit: 2014-05-31 | Discharge: 2014-05-31 | Disposition: A | Discharge status: Home / Self Care | Payer: Self-pay | Source: Ambulatory Visit | Attending: Cardiovascular Disease | Admitting: Cardiovascular Disease

## 2014-06-01 ENCOUNTER — Other Ambulatory Visit: Payer: Self-pay | Admitting: *Deleted

## 2014-06-01 ENCOUNTER — Ambulatory Visit (INDEPENDENT_AMBULATORY_CARE_PROVIDER_SITE_OTHER): Payer: 59 | Admitting: Internal Medicine

## 2014-06-01 ENCOUNTER — Encounter: Payer: Self-pay | Admitting: Internal Medicine

## 2014-06-01 VITALS — BP 114/76 | HR 56 | Temp 97.9°F | Resp 16 | Ht 68.0 in | Wt 199.6 lb

## 2014-06-01 DIAGNOSIS — Z955 Presence of coronary angioplasty implant and graft: Secondary | ICD-10-CM

## 2014-06-01 DIAGNOSIS — I10 Essential (primary) hypertension: Secondary | ICD-10-CM

## 2014-06-01 DIAGNOSIS — E785 Hyperlipidemia, unspecified: Secondary | ICD-10-CM

## 2014-06-01 DIAGNOSIS — K219 Gastro-esophageal reflux disease without esophagitis: Secondary | ICD-10-CM

## 2014-06-01 DIAGNOSIS — R7309 Other abnormal glucose: Secondary | ICD-10-CM

## 2014-06-01 DIAGNOSIS — R7303 Prediabetes: Secondary | ICD-10-CM

## 2014-06-01 DIAGNOSIS — Z79899 Other long term (current) drug therapy: Secondary | ICD-10-CM

## 2014-06-01 DIAGNOSIS — E559 Vitamin D deficiency, unspecified: Secondary | ICD-10-CM

## 2014-06-01 LAB — HEPATIC FUNCTION PANEL
ALBUMIN: 4.3 g/dL (ref 3.5–5.2)
ALK PHOS: 33 U/L — AB (ref 39–117)
ALT: 32 U/L (ref 0–53)
AST: 42 U/L — ABNORMAL HIGH (ref 0–37)
BILIRUBIN DIRECT: 0.2 mg/dL (ref 0.0–0.3)
Indirect Bilirubin: 0.5 mg/dL (ref 0.2–1.2)
TOTAL PROTEIN: 6.9 g/dL (ref 6.0–8.3)
Total Bilirubin: 0.7 mg/dL (ref 0.2–1.2)

## 2014-06-01 LAB — CBC WITH DIFFERENTIAL/PLATELET
BASOS PCT: 0 % (ref 0–1)
Basophils Absolute: 0 10*3/uL (ref 0.0–0.1)
EOS ABS: 0.1 10*3/uL (ref 0.0–0.7)
EOS PCT: 2 % (ref 0–5)
HEMATOCRIT: 43.8 % (ref 39.0–52.0)
HEMOGLOBIN: 15 g/dL (ref 13.0–17.0)
LYMPHS PCT: 29 % (ref 12–46)
Lymphs Abs: 1.6 10*3/uL (ref 0.7–4.0)
MCH: 33 pg (ref 26.0–34.0)
MCHC: 34.2 g/dL (ref 30.0–36.0)
MCV: 96.3 fL (ref 78.0–100.0)
MPV: 10.7 fL (ref 8.6–12.4)
Monocytes Absolute: 0.6 10*3/uL (ref 0.1–1.0)
Monocytes Relative: 12 % (ref 3–12)
NEUTROS ABS: 3.1 10*3/uL (ref 1.7–7.7)
NEUTROS PCT: 57 % (ref 43–77)
Platelets: 202 10*3/uL (ref 150–400)
RBC: 4.55 MIL/uL (ref 4.22–5.81)
RDW: 13.1 % (ref 11.5–15.5)
WBC: 5.4 10*3/uL (ref 4.0–10.5)

## 2014-06-01 LAB — BASIC METABOLIC PANEL WITH GFR
BUN: 19 mg/dL (ref 6–23)
CHLORIDE: 102 meq/L (ref 96–112)
CO2: 23 mEq/L (ref 19–32)
CREATININE: 1.18 mg/dL (ref 0.50–1.35)
Calcium: 10.1 mg/dL (ref 8.4–10.5)
GFR, EST NON AFRICAN AMERICAN: 67 mL/min
GFR, Est African American: 78 mL/min
GLUCOSE: 93 mg/dL (ref 70–99)
Potassium: 4.1 mEq/L (ref 3.5–5.3)
Sodium: 139 mEq/L (ref 135–145)

## 2014-06-01 LAB — LIPID PANEL
CHOL/HDL RATIO: 3.5 ratio
Cholesterol: 147 mg/dL (ref 0–200)
HDL: 42 mg/dL (ref >=40)
LDL CALC: 85 mg/dL (ref 0–99)
Triglycerides: 100 mg/dL (ref <150)
VLDL: 20 mg/dL (ref 0–40)

## 2014-06-01 LAB — HEMOGLOBIN A1C
HEMOGLOBIN A1C: 5.6 % (ref <5.7)
MEAN PLASMA GLUCOSE: 114 mg/dL (ref <117)

## 2014-06-01 LAB — MAGNESIUM: MAGNESIUM: 1.4 mg/dL — AB (ref 1.5–2.5)

## 2014-06-01 LAB — TSH: TSH: 1.051 u[IU]/mL (ref 0.350–4.500)

## 2014-06-01 MED ORDER — RANITIDINE HCL 300 MG PO TABS: ORAL_TABLET | ORAL | Status: DC

## 2014-06-01 MED ORDER — MELOXICAM 15 MG PO TABS: ORAL_TABLET | ORAL | Status: DC

## 2014-06-01 NOTE — Patient Instructions (Signed)
GETTING OFF OF PPI's    Nexium/protonix/prilosec/Omeprazole/Dexilant/Aciphex are called PPI's, they are great at healing your stomach but should only be taken for a short period of time.     Recent studies have shown that taken for a long time they  can increase the risk of osteoporosis (weakening of your bones), pneumonia, low magnesium, restless legs, Cdiff (infection that causes diarrhea), DEMENTIA and most recently kidney damage / disease / insufficiency.     Due to this information we want to try to stop the PPI but if you try to stop it abruptly this can cause rebound acid and worsening symptoms.   So this is how we want you to get off the PPI:  - Start taking the nexium/protonix/prilosec/PPI  every other day with  zantac (ranitidine) 2 x a day for 2-4 weeks  - then decrease the PPI to every 3 days while taking the zantac (ranitidine) twice a day the other  days for 2-4  Weeks  - then you can try the zantac (ranitidine) once at night or up to 2 x day as needed.  - you can continue on this once at night or stop all together  - Avoid alcohol, spicy foods, NSAIDS (aleve, ibuprofen) at this time. See foods below.   +++++++++++++++++++++++++++++++++++++++++++  Food Choices for Gastroesophageal Reflux Disease  When you have gastroesophageal reflux disease (GERD), the foods you eat and your eating habits are very important. Choosing the right foods can help ease the discomfort of GERD. WHAT GENERAL GUIDELINES DO I NEED TO FOLLOW?  Choose fruits, vegetables, whole grains, low-fat dairy products, and low-fat meat, fish, and poultry.  Limit fats such as oils, salad dressings, butter, nuts, and avocado.  Keep a food diary to identify foods that cause symptoms.  Avoid foods that cause reflux. These may be different for different people.  Eat frequent small meals instead of three large meals each day.  Eat your meals slowly, in a relaxed setting.  Limit fried foods.  Cook foods  using methods other than frying.  Avoid drinking alcohol.  Avoid drinking large amounts of liquids with your meals.  Avoid bending over or lying down until 2-3 hours after eating.   WHAT FOODS ARE NOT RECOMMENDED? The following are some foods and drinks that may worsen your symptoms:  Vegetables Tomatoes. Tomato juice. Tomato and spaghetti sauce. Chili peppers. Onion and garlic. Horseradish. Fruits Oranges, grapefruit, and lemon (fruit and juice). Meats High-fat meats, fish, and poultry. This includes hot dogs, ribs, ham, sausage, salami, and bacon. Dairy Whole milk and chocolate milk. Sour cream. Cream. Butter. Ice cream. Cream cheese.  Beverages Coffee and tea, with or without caffeine. Carbonated beverages or energy drinks. Condiments Hot sauce. Barbecue sauce.  Sweets/Desserts Chocolate and cocoa. Donuts. Peppermint and spearmint. Fats and Oils High-fat foods, including French fries and potato chips. Other Vinegar. Strong spices, such as black pepper, white pepper, red pepper, cayenne, curry powder, cloves, ginger, and chili powder. Nexium/protonix/prilosec are called PPI's, they are great at healing your stomach but should only be taken for a short period of time.     ++++++++++++++++++++++++++++++++++  Recommend the book "The END of DIETING" by Dr Joel Fuhrman   & the book "The END of DIABETES " by Dr Joel Fuhrman  At [employer] - get book & Audio CD's      Being diabetic has a  300% increased risk for heart attack, stroke, cancer, and alzheimer- type vascular dementia. It is very important that you work harder   with diet by avoiding all foods that are white. Avoid white rice (brown & wild rice is OK), white potatoes (sweetpotatoes in moderation is OK), White bread or wheat bread or anything made out of white flour like bagels, donuts, rolls, buns, biscuits, cakes, pastries, cookies, pizza crust, and pasta (made from white flour & egg whites) - vegetarian pasta or  spinach or wheat pasta is OK. Multigrain breads like Arnold's or [employer], or multigrain sandwich thins or flatbreads.  Diet, exercise and weight loss can reverse and cure diabetes in the early stages.  Diet, exercise and weight loss is very important in the control and prevention of complications of diabetes which affects every system in your body, ie. Brain - dementia/stroke, eyes - glaucoma/blindness, heart - heart attack/heart failure, kidneys - dialysis, stomach - gastric paralysis, intestines - malabsorption, nerves - severe painful neuritis, circulation - gangrene & loss of a leg(s), and finally cancer and Alzheimers.    I recommend avoid fried & greasy foods,  sweets/candy, white rice (brown or wild rice or Quinoa is OK), white potatoes (sweet potatoes are OK) - anything made from white flour - bagels, doughnuts, rolls, buns, biscuits,white and wheat breads, pizza crust and traditional pasta made of white flour & egg white(vegetarian pasta or spinach or wheat pasta is OK).  Multi-grain bread is OK - like multi-grain flat bread or sandwich thins. Avoid alcohol in excess. Exercise is also important.    Eat all the vegetables you want - avoid meat, especially red meat and dairy - especially cheese.  Cheese is the most concentrated form of trans-fats which is the worst thing to clog up our arteries. Veggie cheese is OK which can be found in the fresh produce section at [employer] or [employer] or [employer]   

## 2014-06-01 NOTE — Progress Notes (Signed)
Patient ID: Marvin Carter, male   DOB: March 11, 1955, 59 y.o.   MRN: [id number]   This very nice 59 y.o. MWM presents for 3 month follow up with Hypertension, Hyperlipidemia, Pre-Diabetes and Vitamin D Deficiency.    Patient is treated for HTN & BP has been controlled at home. Today's BP: 114/76 mmHg. Patient had a DES inserted in Nov 2012 and has done well since continuing in Cardiac rehab. And is followed closely by Dr Denver Faster. Patient has had no complaints of any cardiac type chest pain, palpitations, dyspnea/orthopnea/PND, dizziness, claudication, or dependent edema. Patient also has CKD3 with GFR 59 ml/min and has been advised of recommendations and protocol to taper off his Pantoprazole & transition to Ranitidine.   Hyperlipidemia is controlled with diet & meds. Patient denies myalgias or other med SE's. Last Lipids were at goal - Total Chol 147; HD 42; LDL  85; Trig 100 on 06/01/2014   Also, the patient has history of PreDiabetes since May 2008 and has had no symptoms of reactive hypoglycemia, diabetic polys, paresthesias or visual blurring.  Last A1c was 5.4% on 02/23/2014.   Further, the patient also has history of Vitamin D Deficiency of 19 in 2008  and supplements vitamin D without any suspected side-effects. Last vitamin D was VIT D 69 on 02/23/2014.  Medication Sig  . aspirin 81 MG tablet Take 81 mg by mouth daily.  Marland Kitchen atenolol 100 MG tablet Take 1 tablet (100 mg total) by mouth daily.  Marland Kitchen atorvastatin  40 MG tablet Take 1 tablet by mouth  every day for cholesterol  . B complex vitamins tablet Take 1 tablet by mouth daily.  Marland Kitchen BENICAR 40 MG tablet Take 1 tablet by mouth  daily for blood pressure  . buPROPion  XL 300 MG 24 hr Take 1 tablet by mouth  daily for mood  . clopidogrel  75 MG tablet Take 1 tablet (75 mg total) by mouth daily.  Marland Kitchen diltiazem CD 240 MG  Take 1 capsule by mouth  daily for blood pressure  . fenofibrate 160 MG tablet Take 1 tablet (160 mg total) by mouth daily.  . fish oil  1000 MG cap Take 1 g by mouth daily. Takes 3 gm daily  . furosemide (LASIX) 80 MG tablet Take 1 tablet by mouth  daily for blood pressure  and fluid  . NORCO 10-325 MG    . meloxicam  15 MG tablet TAKE 1 TABLET BY MOUTH EVERY DAY FOR PAIN/INFLAMATION  . MULTIVITAMIN WITH MINERALS Take 2 tablets by mouth daily.   . pantoprazole  40 MG tablet Take 1 tablet (40 mg total) by mouth daily.  Marland Kitchen DEPO-TESTOTERONE  200 MG/ML inj INJECT 2 MLS INTRAMUSCULARLY EVERY 12 days   Allergies  Allergen Reactions  . Hctz [Hydrochlorothiazide]   . Ibuprofen   . Tape Rash    Clear tape   PMHx:   Past Medical History  Diagnosis Date  . Hypertension   . Hyperlipidemia   . Diverticulitis   . Hemorrhoids   . GERD (gastroesophageal reflux disease)   . Anxiety   . Meniscus tear 01/02/2011  . Right knee pain 01/02/2011  . Diverticulosis   . Hemarthrosis of knee, right 01/04/2011  . CAD (coronary artery disease), native coronary artery 01/04/2011  . Stented coronary artery 01/04/2011    LAD DES Resolute.  . Hyperlipemia 01/04/2011  . History of meniscectomy of right knee 01/02/2011  . Torn medial meniscus     right knee  .  NSVT (nonsustained ventricular tachycardia) 01/02/2011  . Atrial flutter, paroxysmal 01/04/2011  . Arthritis    Immunization History  Administered Date(s) Administered  . Influenza Split 12/16/2013  . Influenza Whole 12/09/2012  . PPD Test 11/05/2013  . Td 02/19/2005   Past Surgical History  Procedure Laterality Date  . Appendectomy    . Knee arthroscopy      x 8 left  . Rotator cuff repair      bilateral  . Hand surgery      right  . Bicept tendon repair      bilateral  . Tonsillectomy    . Knee arthroscopy Right 04/21/2012    Procedure: ARTHROSCOPY KNEE;  Surgeon: Lorn Junes, MD;  Location: [facility];  Service: Orthopedics;  Laterality: Right;  medial and lateral meniscectomies  . Coronary angioplasty with stent placement  2012  . Left heart catheterization with  coronary angiogram N/A 01/03/2011    Procedure: LEFT HEART CATHETERIZATION WITH CORONARY ANGIOGRAM;  Surgeon: Lorretta Harp, MD;  Location:  [facility] CATH LAB;  Service: Cardiovascular;  Laterality: N/A;  . Percutaneous coronary stent intervention (pci-s) N/A 01/03/2011    Procedure: PERCUTANEOUS CORONARY STENT INTERVENTION (PCI-S);  Surgeon: Lorretta Harp, MD;  Location: [facility] CATH LAB;  Service: Cardiovascular;  Laterality: N/A;   FHx:    Reviewed / unchanged  SHx:    Reviewed / unchanged  Systems Review:  Constitutional: Denies fever, chills, wt changes, headaches, insomnia, fatigue, night sweats, change in appetite. Eyes: Denies redness, blurred vision, diplopia, discharge, itchy, watery eyes.  ENT: Denies discharge, congestion, post nasal drip, epistaxis, sore throat, earache, hearing loss, dental pain, tinnitus, vertigo, sinus pain, snoring.  CV: Denies chest pain, palpitations, irregular heartbeat, syncope, dyspnea, diaphoresis, orthopnea, PND, claudication or edema. Respiratory: denies cough, dyspnea, DOE, pleurisy, hoarseness, laryngitis, wheezing.  Gastrointestinal: Denies dysphagia, odynophagia, heartburn, reflux, water brash, abdominal pain or cramps, nausea, vomiting, bloating, diarrhea, constipation, hematemesis, melena, hematochezia  or hemorrhoids. Genitourinary: Denies dysuria, frequency, urgency, nocturia, hesitancy, discharge, hematuria or flank pain. Musculoskeletal: Denies arthralgias, myalgias, stiffness, jt. swelling, pain, limping or strain/sprain.  Skin: Denies pruritus, rash, hives, warts, acne, eczema or change in skin lesion(s). Neuro: No weakness, tremor, incoordination, spasms, paresthesia or pain. Psychiatric: Denies confusion, memory loss or sensory loss. Endo: Denies change in weight, skin or hair change.  Heme/Lymph: No excessive bleeding, bruising or enlarged lymph nodes.  Physical Exam  BP 114/76   Pulse 56  Temp 97.9 F   Resp 16  Ht 5\' 8"    Wt 199 lb  9.6 oz     BMI 30.36   Appears well nourished and in no distress. Eyes: PERRLA, EOMs, conjunctiva no swelling or erythema. Sinuses: No frontal/maxillary tenderness ENT/Mouth: EAC's clear, TM's nl w/o erythema, bulging. Nares clear w/o erythema, swelling, exudates. Oropharynx clear without erythema or exudates. Oral hygiene is good. Tongue normal, non obstructing. Hearing intact.  Neck: Supple. Thyroid nl. Car 2+/2+ without bruits, nodes or JVD. Chest: Respirations nl with BS clear & equal w/o rales, rhonchi, wheezing or stridor.  Cor: Heart sounds normal w/ regular rate and rhythm without sig. murmurs, gallops, clicks, or rubs. Peripheral pulses normal and equal  without edema.  Abdomen: Soft & bowel sounds normal. Non-tender w/o guarding, rebound, hernias, masses, or organomegaly.  Lymphatics: Unremarkable.  Musculoskeletal: Full ROM all peripheral extremities, joint stability, 5/5 strength, and normal gait.  Skin: Warm, dry without exposed rashes, lesions or ecchymosis apparent.  Neuro: Cranial nerves intact, reflexes equal bilaterally. Sensory-motor testing grossly  intact. Tendon reflexes grossly intact.  Pysch: Alert & oriented x 3.  Insight and judgement nl & appropriate. No ideations.  Assessment and Plan:  1. Essential hypertension  - TSH  2. Hyperlipidemia  - Lipid panel  3. Prediabetes  - Hemoglobin A1c - Insulin, random  4. Vitamin D deficiency  - Vit D  25 hydroxy (rtn osteoporosis monitoring)  5. ASCAD s/p LAD stent (12/2010)   6. Gastroesophageal reflux disease, esophagitis presence not specified  - ranitidine (ZANTAC) 300 MG tablet; Take 1 to 2 tablets daily to facilitate tapering off PPI's for heartburn/acid indigestion  Dispense: 90 tablet; Refill: 99  7. Medication management  - CBC with Differential/Platelet - BASIC METABOLIC PANEL WITH GFR - Hepatic function panel - Magnesium   Recommended regular exercise, BP monitoring, weight control, and  discussed med and SE's. Recommended labs to assess and monitor clinical status. Further disposition pending results of labs. Over 30 minutes of exam, counseling, chart review was performed

## 2014-06-02 ENCOUNTER — Encounter (HOSPITAL_COMMUNITY): Payer: Self-pay

## 2014-06-02 LAB — VITAMIN D 25 HYDROXY (VIT D DEFICIENCY, FRACTURES): VIT D 25 HYDROXY: 76 ng/mL (ref 30–100)

## 2014-06-02 LAB — INSULIN, RANDOM: Insulin: 5.6 u[IU]/mL (ref 2.0–19.6)

## 2014-06-04 ENCOUNTER — Encounter (HOSPITAL_COMMUNITY)
Admission: RE | Admit: 2014-06-04 | Discharge: 2014-06-04 | Disposition: A | Discharge status: Home / Self Care | Payer: Self-pay | Source: Ambulatory Visit | Attending: Cardiovascular Disease | Admitting: Cardiovascular Disease

## 2014-06-07 ENCOUNTER — Encounter (HOSPITAL_COMMUNITY)
Admission: RE | Admit: 2014-06-07 | Discharge: 2014-06-07 | Disposition: A | Discharge status: Home / Self Care | Payer: Self-pay | Source: Ambulatory Visit | Attending: Cardiovascular Disease | Admitting: Cardiovascular Disease

## 2014-06-09 ENCOUNTER — Encounter (HOSPITAL_COMMUNITY)
Admission: RE | Admit: 2014-06-09 | Discharge: 2014-06-09 | Disposition: A | Discharge status: Home / Self Care | Payer: Self-pay | Source: Ambulatory Visit | Attending: Cardiovascular Disease | Admitting: Cardiovascular Disease

## 2014-06-11 ENCOUNTER — Encounter (HOSPITAL_COMMUNITY)
Admission: RE | Admit: 2014-06-11 | Discharge: 2014-06-11 | Disposition: A | Discharge status: Home / Self Care | Payer: Self-pay | Source: Ambulatory Visit | Attending: Cardiovascular Disease | Admitting: Cardiovascular Disease

## 2014-06-14 ENCOUNTER — Encounter (HOSPITAL_COMMUNITY)
Admission: RE | Admit: 2014-06-14 | Discharge: 2014-06-14 | Disposition: A | Discharge status: Home / Self Care | Payer: Self-pay | Source: Ambulatory Visit | Attending: Cardiovascular Disease | Admitting: Cardiovascular Disease

## 2014-06-16 ENCOUNTER — Encounter (HOSPITAL_COMMUNITY)
Admission: RE | Admit: 2014-06-16 | Discharge: 2014-06-16 | Disposition: A | Discharge status: Home / Self Care | Payer: Self-pay | Source: Ambulatory Visit | Attending: Cardiovascular Disease | Admitting: Cardiovascular Disease

## 2014-06-18 ENCOUNTER — Encounter (HOSPITAL_COMMUNITY)
Admission: RE | Admit: 2014-06-18 | Discharge: 2014-06-18 | Disposition: A | Discharge status: Home / Self Care | Payer: Self-pay | Source: Ambulatory Visit | Attending: Cardiovascular Disease | Admitting: Cardiovascular Disease

## 2014-06-21 ENCOUNTER — Encounter (HOSPITAL_COMMUNITY)
Admission: RE | Admit: 2014-06-21 | Discharge: 2014-06-21 | Disposition: A | Discharge status: Home / Self Care | Payer: Self-pay | Source: Ambulatory Visit | Attending: Cardiovascular Disease | Admitting: Cardiovascular Disease

## 2014-06-21 DIAGNOSIS — E785 Hyperlipidemia, unspecified: Secondary | ICD-10-CM | POA: Insufficient documentation

## 2014-06-21 DIAGNOSIS — I10 Essential (primary) hypertension: Secondary | ICD-10-CM | POA: Insufficient documentation

## 2014-06-21 DIAGNOSIS — Z5189 Encounter for other specified aftercare: Secondary | ICD-10-CM | POA: Insufficient documentation

## 2014-06-21 DIAGNOSIS — I4892 Unspecified atrial flutter: Secondary | ICD-10-CM | POA: Insufficient documentation

## 2014-06-21 DIAGNOSIS — I472 Ventricular tachycardia: Secondary | ICD-10-CM | POA: Insufficient documentation

## 2014-06-21 DIAGNOSIS — I2510 Atherosclerotic heart disease of native coronary artery without angina pectoris: Secondary | ICD-10-CM | POA: Insufficient documentation

## 2014-06-21 DIAGNOSIS — Z955 Presence of coronary angioplasty implant and graft: Secondary | ICD-10-CM | POA: Insufficient documentation

## 2014-06-23 ENCOUNTER — Encounter (HOSPITAL_COMMUNITY)
Admission: RE | Admit: 2014-06-23 | Discharge: 2014-06-23 | Disposition: A | Discharge status: Home / Self Care | Payer: Self-pay | Source: Ambulatory Visit | Attending: Cardiovascular Disease | Admitting: Cardiovascular Disease

## 2014-06-25 ENCOUNTER — Encounter (HOSPITAL_COMMUNITY): Payer: Self-pay

## 2014-06-28 ENCOUNTER — Encounter: Payer: Self-pay | Admitting: Internal Medicine

## 2014-06-28 ENCOUNTER — Encounter (HOSPITAL_COMMUNITY): Payer: Self-pay

## 2014-06-28 ENCOUNTER — Ambulatory Visit (INDEPENDENT_AMBULATORY_CARE_PROVIDER_SITE_OTHER): Payer: 59 | Admitting: Internal Medicine

## 2014-06-28 VITALS — BP 116/72 | HR 56 | Temp 97.7°F | Resp 16 | Ht 68.0 in | Wt 199.8 lb

## 2014-06-28 DIAGNOSIS — I10 Essential (primary) hypertension: Secondary | ICD-10-CM

## 2014-06-28 DIAGNOSIS — N400 Enlarged prostate without lower urinary tract symptoms: Secondary | ICD-10-CM

## 2014-06-28 DIAGNOSIS — N410 Acute prostatitis: Secondary | ICD-10-CM

## 2014-06-28 MED ORDER — CIPROFLOXACIN HCL 500 MG PO TABS: ORAL_TABLET | ORAL | Status: DC

## 2014-06-28 MED ORDER — DOXAZOSIN MESYLATE 8 MG PO TABS: ORAL_TABLET | ORAL | Status: DC

## 2014-06-28 NOTE — Progress Notes (Signed)
Subjective:    Patient ID: Marvin Carter, male    DOB: 17-Aug-1955, 59 y.o.   MRN: [id number]  HPI Patient presents with c/o LUTS and sensation of incomplete bladder emptying. Nocturia x 3 . Hx/o acute Urinary retention requiring catherization when hospitalized with an acute diverticulitis.  Medication Sig  . aspirin 81 MG tablet Take 81 mg by mouth daily.  Marland Kitchen atenolol (TENORMIN) 100 MG tablet Take 1 tablet (100 mg total) by mouth daily.  Marland Kitchen atorvastatin (LIPITOR) 40 MG tablet Take 1 tablet by mouth  every day for cholesterol  . b complex vitamins tablet Take 1 tablet by mouth daily.  . B-D 3CC LUER-LOK SYR 21GX1" 21G X 1" 3 ML MISC USE WITH TESTOSTERONE AS DIRECTED  . BENICAR 40 MG tablet Take 1 tablet by mouth  daily for blood pressure  . buPROPion (WELLBUTRIN XL) 300 MG 24 hr tablet Take 1 tablet by mouth  daily for mood  . clopidogrel (PLAVIX) 75 MG tablet Take 1 tablet (75 mg total) by mouth daily.  Marland Kitchen diltiazem (CARDIZEM CD) 240 MG 24 hr capsule Take 1 capsule by mouth  daily for blood pressure  . fenofibrate 160 MG tablet Take 1 tablet (160 mg total) by mouth daily.  . fish oil-omega-3 fatty acids 1000 MG capsule Take 1 g by mouth daily. Takes 3 gm daily  . furosemide (LASIX) 80 MG tablet Take 1 tablet by mouth  daily for blood pressure  and fluid  . HYDROcodone-acetaminophen (NORCO) 10-325 MG per tablet   . meloxicam (MOBIC) 15 MG tablet TAKE 1 TABLET BY MOUTH EVERY DAY FOR PAIN/INFLAMATION  . Multiple Vitamin (MULTIVITAMIN WITH MINERALS) TABS Take 2 tablets by mouth daily.   . pantoprazole (PROTONIX) 40 MG tablet Take 1 tablet (40 mg total) by mouth daily.  . ranitidine (ZANTAC) 300 MG tablet Take 1 to 2 tablets daily to facilitate tapering off PPI's for heartburn/acid indigestion  . testosterone cypionate (DEPOTESTOTERONE CYPIONATE) 200 MG/ML injection INJECT 2 MLS INTRAMUSCULARLY EVERY 12 days   Allergies  Allergen Reactions  . Hctz [Hydrochlorothiazide]   . Ibuprofen   . Tape  Rash    Clear tape   Past Medical History  Diagnosis Date  . Hypertension   . Hyperlipidemia   . Diverticulitis   . Hemorrhoids   . GERD (gastroesophageal reflux disease)   . Anxiety   . Meniscus tear 01/02/2011  . Right knee pain 01/02/2011  . Diverticulosis   . Hemarthrosis of knee, right 01/04/2011  . CAD (coronary artery disease), native coronary artery 01/04/2011  . Stented coronary artery 01/04/2011    LAD DES Resolute.  . Hyperlipemia 01/04/2011  . History of meniscectomy of right knee 01/02/2011  . Torn medial meniscus     right knee  . NSVT (nonsustained ventricular tachycardia) 01/02/2011  . Atrial flutter, paroxysmal 01/04/2011  . Arthritis    Review of Systems  Negative except as (+) above.    Objective:   Physical Exam   BP 116/72 mmHg  Pulse 56  Temp(Src) 97.7 F (36.5 C)  Resp 16  Ht 5\' 8"  (1.727 m)  Wt 199 lb 12.8 oz (90.629 kg)  BMI 30.39 kg/m2   Abd - No palpable organomegaly, masses or tenderness. BS nl. GU - (+) ext hemorrhoids. DRE with prostate nl size for age to slightly enlarged sl boggy and tender w/o nodules. Hemoccult negative.    Assessment & Plan:   1. Acute prostatitis  - ciprofloxacin (CIPRO) 500 MG tablet; Take 1  tablet 2 x daily with food for infection  Dispense: 30 tablet; Refill: 1 - Urine Microscopic - Urine culture  2. Prostatism  - doxazosin (CARDURA) 8 MG tablet; Take 1/2 to 1 tablet daily for BP & Prostate  Dispense: 90 tablet; Refill: 1  3. Essential hypertension  - doxazosin (CARDURA) 8 MG tablet; Take 1/2 to 1 tablet daily for BP & Prostate  Dispense: 90 tablet; Refill: 1  - ROV - 1 mo to recheck.

## 2014-06-28 NOTE — Patient Instructions (Signed)
Prostatitis The prostate gland is about the size and shape of a walnut. It is located just below your bladder. It produces one of the components of semen, which is made up of sperm and the fluids that help nourish and transport it out from the testicles. Prostatitis is inflammation of the prostate gland.  There are four types of prostatitis:  Acute bacterial prostatitis. This is the least common type of prostatitis. It starts quickly and usually is associated with a bladder infection, high fever, and shaking chills. It can occur at any age.  Chronic bacterial prostatitis. This is a persistent bacterial infection in the prostate. It usually develops from repeated acute bacterial prostatitis or acute bacterial prostatitis that was not properly treated. It can occur in men of any age but is most common in middle-aged men whose prostate has begun to enlarge. The symptoms are not as severe as those in acute bacterial prostatitis. Discomfort in the part of your body that is in front of your rectum and below your scrotum (perineum), lower abdomen, or in the head of your penis (glans) may represent your primary discomfort.  Chronic prostatitis (nonbacterial). This is the most common type of prostatitis. It is inflammation of the prostate gland that is not caused by a bacterial infection. The cause is unknown and may be associated with a viral infection or autoimmune disorder.  Prostatodynia (pelvic floor disorder). This is associated with increased muscular tone in the pelvis surrounding the prostate. CAUSES The causes of bacterial prostatitis are bacterial infection. The causes of the other types of prostatitis are unknown.  SYMPTOMS  Symptoms can vary depending upon the type of prostatitis that exists. There can also be overlap in symptoms. Possible symptoms for each type of prostatitis are listed below. Acute Bacterial Prostatitis  Painful urination.  Fever or chills.  Muscle or joint pains.  Low  back pain.  Low abdominal pain.  Inability to empty bladder completely. Chronic Bacterial Prostatitis, Chronic Nonbacterial Prostatitis, and Prostatodynia  Sudden urge to urinate.  Frequent urination.  Difficulty starting urine stream.  Weak urine stream.  Discharge from the urethra.  Dribbling after urination.  Rectal pain.  Pain in the testicles, penis, or tip of the penis.  Pain in the perineum.  Problems with sexual function.  Painful ejaculation.  Bloody semen. DIAGNOSIS  In order to diagnose prostatitis, your health care provider will ask about your symptoms. One or more urine samples will be taken and tested (urinalysis). If the urinalysis result is negative for bacteria, your health care provider may use a finger to feel your prostate (digital rectal exam). This exam helps your health care provider determine if your prostate is swollen and tender. It will also produce a specimen of semen that can be analyzed. TREATMENT  Treatment for prostatitis depends on the cause. If a bacterial infection is the cause, it can be treated with antibiotic medicine. In cases of chronic bacterial prostatitis, the use of antibiotics for up to 1 month or 6 weeks may be necessary. Your health care provider may instruct you to take sitz baths to help relieve pain. A sitz bath is a bath of hot water in which your hips and buttocks are under water. This relaxes the pelvic floor muscles and often helps to relieve the pressure on your prostate. HOME CARE INSTRUCTIONS   Take all medicines as directed by your health care provider.  Take sitz baths as directed by your health care provider. SEEK MEDICAL CARE IF:   Your symptoms   get worse, not better.  You have a fever. SEEK IMMEDIATE MEDICAL CARE IF:   You have chills.  You feel nauseous or vomit.  You feel lightheaded or faint.  You are unable to urinate.  You have blood or blood clots in your urine. MAKE SURE YOU:  Understand  these instructions.  Will watch your condition.  Will get help right away if you are not doing well or get worse.  +++++++++++++++++++++++++   Benign Prostatic Hyperplasia An enlarged prostate (benign prostatic hyperplasia) is common in older men. You may experience the following:  Weak urine stream.  Dribbling.  Feeling like the bladder has not emptied completely.  Difficulty starting urination.  Getting up frequently at night to urinate.  Urinating more frequently during the day. HOME CARE INSTRUCTIONS  Monitor your prostatic hyperplasia for any changes. The following actions may help to alleviate any discomfort you are experiencing:  Give yourself time when you urinate.  Stay away from alcohol.  Avoid beverages containing caffeine, such as coffee, tea, and colas, because they can make the problem worse.  Avoid decongestants, antihistamines, and some prescription medicines that can make the problem worse.  Follow up with your health care provider for further treatment as recommended. SEEK MEDICAL CARE IF:  You are experiencing progressive difficulty voiding.  Your urine stream is progressively getting narrower.  You are awaking from sleep with the urge to void more frequently.  You are constantly feeling the need to void.  You experience loss of urine, especially in small amounts. SEEK IMMEDIATE MEDICAL CARE IF:   You develop increased pain with urination or are unable to urinate.  You develop severe abdominal pain, vomiting, a high fever, or fainting.  You develop back pain or blood in your urine. MAKE SURE YOU:   Understand these instructions.  Will watch your condition.  Will get help right away if you are not doing well or get worse. Document Released: 02/05/2005 Document Revised: 10/08/2012 Document Reviewed: 07/08/2012 [facility] Patient Information 2015 [place], [place]. This information is not intended to replace advice given to you by your health  care provider. Make sure you discuss any questions you have with your health care provider.

## 2014-06-29 LAB — URINALYSIS, MICROSCOPIC ONLY
Bacteria, UA: NONE SEEN
Casts: NONE SEEN
Crystals: NONE SEEN
SQUAMOUS EPITHELIAL / LPF: NONE SEEN

## 2014-06-30 ENCOUNTER — Encounter (HOSPITAL_COMMUNITY)
Admission: RE | Admit: 2014-06-30 | Discharge: 2014-06-30 | Disposition: A | Discharge status: Home / Self Care | Payer: Self-pay | Source: Ambulatory Visit | Attending: Cardiovascular Disease | Admitting: Cardiovascular Disease

## 2014-06-30 ENCOUNTER — Encounter: Payer: Self-pay | Admitting: *Deleted

## 2014-06-30 LAB — URINE CULTURE
Colony Count: NO GROWTH
ORGANISM ID, BACTERIA: NO GROWTH

## 2014-07-02 ENCOUNTER — Encounter (HOSPITAL_COMMUNITY)
Admission: RE | Admit: 2014-07-02 | Discharge: 2014-07-02 | Disposition: A | Discharge status: Home / Self Care | Payer: Self-pay | Source: Ambulatory Visit | Attending: Cardiovascular Disease | Admitting: Cardiovascular Disease

## 2014-07-04 ENCOUNTER — Other Ambulatory Visit: Payer: Self-pay | Admitting: Internal Medicine

## 2014-07-05 ENCOUNTER — Encounter (HOSPITAL_COMMUNITY)
Admission: RE | Admit: 2014-07-05 | Discharge: 2014-07-05 | Disposition: A | Discharge status: Home / Self Care | Payer: Self-pay | Source: Ambulatory Visit | Attending: Cardiovascular Disease | Admitting: Cardiovascular Disease

## 2014-07-07 ENCOUNTER — Encounter (HOSPITAL_COMMUNITY): Payer: Self-pay

## 2014-07-09 ENCOUNTER — Encounter (HOSPITAL_COMMUNITY)
Admission: RE | Admit: 2014-07-09 | Discharge: 2014-07-09 | Disposition: A | Discharge status: Home / Self Care | Payer: Self-pay | Source: Ambulatory Visit | Attending: Cardiovascular Disease | Admitting: Cardiovascular Disease

## 2014-07-12 ENCOUNTER — Encounter (HOSPITAL_COMMUNITY)
Admission: RE | Admit: 2014-07-12 | Discharge: 2014-07-12 | Disposition: A | Discharge status: Home / Self Care | Payer: Self-pay | Source: Ambulatory Visit | Attending: Cardiovascular Disease | Admitting: Cardiovascular Disease

## 2014-07-14 ENCOUNTER — Encounter (HOSPITAL_COMMUNITY): Admission: RE | Admit: 2014-07-14 | Payer: Self-pay | Source: Ambulatory Visit

## 2014-07-14 ENCOUNTER — Telehealth (HOSPITAL_COMMUNITY): Payer: Self-pay | Admitting: *Deleted

## 2014-07-16 ENCOUNTER — Encounter (HOSPITAL_COMMUNITY)
Admission: RE | Admit: 2014-07-16 | Discharge: 2014-07-16 | Disposition: A | Discharge status: Home / Self Care | Payer: Self-pay | Source: Ambulatory Visit | Attending: Cardiovascular Disease | Admitting: Cardiovascular Disease

## 2014-07-21 ENCOUNTER — Encounter (HOSPITAL_COMMUNITY)
Admission: RE | Admit: 2014-07-21 | Discharge: 2014-07-21 | Disposition: A | Discharge status: Home / Self Care | Payer: Self-pay | Source: Ambulatory Visit | Attending: Cardiovascular Disease | Admitting: Cardiovascular Disease

## 2014-07-21 DIAGNOSIS — Z5189 Encounter for other specified aftercare: Secondary | ICD-10-CM | POA: Insufficient documentation

## 2014-07-21 DIAGNOSIS — I10 Essential (primary) hypertension: Secondary | ICD-10-CM | POA: Insufficient documentation

## 2014-07-21 DIAGNOSIS — I2510 Atherosclerotic heart disease of native coronary artery without angina pectoris: Secondary | ICD-10-CM | POA: Insufficient documentation

## 2014-07-21 DIAGNOSIS — E785 Hyperlipidemia, unspecified: Secondary | ICD-10-CM | POA: Insufficient documentation

## 2014-07-21 DIAGNOSIS — I4892 Unspecified atrial flutter: Secondary | ICD-10-CM | POA: Insufficient documentation

## 2014-07-21 DIAGNOSIS — I472 Ventricular tachycardia: Secondary | ICD-10-CM | POA: Insufficient documentation

## 2014-07-21 DIAGNOSIS — Z955 Presence of coronary angioplasty implant and graft: Secondary | ICD-10-CM | POA: Insufficient documentation

## 2014-07-26 ENCOUNTER — Encounter (HOSPITAL_COMMUNITY)
Admission: RE | Admit: 2014-07-26 | Discharge: 2014-07-26 | Disposition: A | Discharge status: Home / Self Care | Payer: Self-pay | Source: Ambulatory Visit | Attending: Cardiovascular Disease | Admitting: Cardiovascular Disease

## 2014-07-28 ENCOUNTER — Encounter (HOSPITAL_COMMUNITY)
Admission: RE | Admit: 2014-07-28 | Discharge: 2014-07-28 | Disposition: A | Discharge status: Home / Self Care | Payer: Self-pay | Source: Ambulatory Visit | Attending: Cardiovascular Disease | Admitting: Cardiovascular Disease

## 2014-07-29 ENCOUNTER — Encounter: Payer: Self-pay | Admitting: Internal Medicine

## 2014-07-29 ENCOUNTER — Ambulatory Visit (INDEPENDENT_AMBULATORY_CARE_PROVIDER_SITE_OTHER): Payer: 59 | Admitting: Internal Medicine

## 2014-07-29 VITALS — BP 110/64 | HR 72 | Temp 97.5°F | Resp 16 | Ht 68.0 in | Wt 205.0 lb

## 2014-07-29 DIAGNOSIS — N410 Acute prostatitis: Secondary | ICD-10-CM

## 2014-07-29 DIAGNOSIS — N400 Enlarged prostate without lower urinary tract symptoms: Secondary | ICD-10-CM

## 2014-07-29 NOTE — Progress Notes (Signed)
Subjective:    Patient ID: Marvin Carter, male    DOB: Nov 21, 1955, 59 y.o.   MRN: [id number]  HPI  Patient presents for 1 month f/u of tx of suspected prostatitis with prostatism . He reports prompt response to treatments with improvement of sx's and therefore stoppe the cioro . As his prostatism sx's improved he maintained his doxazosin at 1/2 tab (4 mg) with mild postural sx's andBP's dropping to lower range than he usually runs, so he cut his Benicar dose in 1/2 with BP's staying in a good mid Nl range.  Medication Sig  . aspirin 81 MG tablet Take 81 mg by mouth daily.  Marland Kitchen atenolol (TENORMIN) 100 MG tablet Take 1 tablet (100 mg total) by mouth daily.  Marland Kitchen atorvastatin (LIPITOR) 40 MG tablet Take 1 tablet by mouth  every day for cholesterol  . B complex vitamins tablet Take 1 tablet by mouth daily.  Marland Kitchen BENICAR 40 MG tablet Take 1 tablet by mouth  daily for blood pressure --->> 1/2 tab  . buPROPion  XL 300 MG 24 hr tablet Take 1 tablet by mouth  daily for mood  . clopidogrel (PLAVIX) 75 MG tablet Take 1 tablet (75 mg total) by mouth daily.  Marland Kitchen diltiazem (CARDIZEM CD) 240 MG 24 hr capsule Take 1 capsule by mouth  daily for blood pressure  . doxazosin (CARDURA) 8 MG tablet Take 1/2  daily for BP & Prostate  . fenofibrate 160 MG tablet Take 1 tablet (160 mg total) by mouth daily.  . fish oil 1000 MG capsule Take 1 g by mouth daily. Takes 3 gm daily  . furosemide (LASIX) 80 MG tablet Take 1 tablet by mouth   daily for blood pressure   and fluid  . NORCO 10-325 MG per tablet   . meloxicam (MOBIC) 15 MG tablet TAKE 1 TABLET BY MOUTH EVERY DAY FOR PAIN/INFLAMATION  . MULTIVITAMIN WITH MINERALS Take 2 tablets by mouth daily.   . pantoprazole (PROTONIX) 40 MG tablet Take 1 tablet (40 mg total) by mouth daily.  . ranitidine (ZANTAC) 300 MG tablet Take 1 to 2 tablets daily to facilitate tapering off PPI's for heartburn/acid indigestion  . DEPO-TESTOTERONE  200 MG/ML injection INJECT 2 MLS INTRAMUSCULARLY  EVERY 12 days  . ciprofloxacin (CIPRO) 500 MG tablet Take 1 tablet 2 x daily with food for infection   Allergies  Allergen Reactions  . Hctz [Hydrochlorothiazide]   . Ibuprofen   . Tape Rash    Clear tape   Past Medical History  Diagnosis Date  . Hypertension   . Hyperlipidemia   . Diverticulitis   . Hemorrhoids   . GERD (gastroesophageal reflux disease)   . Anxiety   . Meniscus tear 01/02/2011  . Right knee pain 01/02/2011  . Diverticulosis   . Hemarthrosis of knee, right 01/04/2011  . CAD (coronary artery disease), native coronary artery 01/04/2011  . Stented coronary artery 01/04/2011    LAD DES Resolute.  . Hyperlipemia 01/04/2011  . History of meniscectomy of right knee 01/02/2011  . Torn medial meniscus     right knee  . NSVT (nonsustained ventricular tachycardia) 01/02/2011  . Atrial flutter, paroxysmal 01/04/2011  . Arthritis    Past Surgical History  Procedure Laterality Date  . Appendectomy    . Knee arthroscopy      x 8 left  . Rotator cuff repair      bilateral  . Hand surgery      right  . Bicept  tendon repair      bilateral  . Tonsillectomy    . Knee arthroscopy Right 04/21/2012    Procedure: ARTHROSCOPY KNEE;  Surgeon: Lorn Junes, MD;  Location: [place];  Service: Orthopedics;  Laterality: Right;  medial and lateral meniscectomies  . Coronary angioplasty with stent placement  2012  . Left heart catheterization with coronary angiogram N/A 01/03/2011    Procedure: LEFT HEART CATHETERIZATION WITH CORONARY ANGIOGRAM;  Surgeon: Lorretta Harp, MD;  Location: [facility] CATH LAB;  Service: Cardiovascular;  Laterality: N/A;  . Percutaneous coronary stent intervention (pci-s) N/A 01/03/2011    Procedure: PERCUTANEOUS CORONARY STENT INTERVENTION (PCI-S);  Surgeon: Lorretta Harp, MD;  Location: [facility] CATH LAB;  Service: Cardiovascular;  Laterality: N/A;   Review of Systems 10 point systems review negative except as above.    Objective:   Physical Exam  BP  110/64 mmHg  Pulse 72  Temp(Src) 97.5 F (36.4 C)  Resp 16  Ht 5\' 8"  (1.727 m)  Wt 205 lb (92.987 kg)  BMI 31.18 kg/m2  HEENT - Eac's patent. TM's Nl. EOM's full. PERRLA. NasoOroPharynx clear. Neck - supple. Nl Thyroid. Carotids 2+ & No bruits, nodes, JVD Chest - Clear equal BS w/o Rales, rhonchi, wheezes. Cor - Nl HS. RRR w/o sig MGR. PP 1(+). No edema. Abd - No palpable organomegaly, masses or tenderness. BS nl. MS- FROM w/o deformities. Muscle power, tone and bulk Nl. Gait Nl. Neuro - No obvious Cr N abnormalities. Sensory, motor and Cerebellar functions appear Nl w/o focal abnormalities. Psyche - Mental status normal & appropriate.  No delusions, ideations or obvious mood abnormalities.    Assessment & Plan:   1. Prostatism   2. Acute prostatitis  - Continue meds same as above.

## 2014-07-30 ENCOUNTER — Encounter (HOSPITAL_COMMUNITY)
Admission: RE | Admit: 2014-07-30 | Discharge: 2014-07-30 | Disposition: A | Discharge status: Home / Self Care | Payer: Self-pay | Source: Ambulatory Visit | Attending: Cardiovascular Disease | Admitting: Cardiovascular Disease

## 2014-08-04 ENCOUNTER — Encounter (HOSPITAL_COMMUNITY): Admission: RE | Admit: 2014-08-04 | Payer: Self-pay | Source: Ambulatory Visit

## 2014-08-04 ENCOUNTER — Telehealth (HOSPITAL_COMMUNITY): Payer: Self-pay | Admitting: *Deleted

## 2014-08-06 ENCOUNTER — Encounter (HOSPITAL_COMMUNITY): Payer: Self-pay

## 2014-08-09 ENCOUNTER — Encounter (HOSPITAL_COMMUNITY): Payer: Self-pay

## 2014-08-11 ENCOUNTER — Encounter (HOSPITAL_COMMUNITY)
Admission: RE | Admit: 2014-08-11 | Discharge: 2014-08-11 | Disposition: A | Discharge status: Home / Self Care | Payer: Self-pay | Source: Ambulatory Visit | Attending: Cardiovascular Disease | Admitting: Cardiovascular Disease

## 2014-08-13 ENCOUNTER — Encounter (HOSPITAL_COMMUNITY)
Admission: RE | Admit: 2014-08-13 | Discharge: 2014-08-13 | Disposition: A | Discharge status: Home / Self Care | Payer: Self-pay | Source: Ambulatory Visit | Attending: Cardiovascular Disease | Admitting: Cardiovascular Disease

## 2014-08-16 ENCOUNTER — Encounter (HOSPITAL_COMMUNITY): Payer: Self-pay

## 2014-08-18 ENCOUNTER — Encounter (HOSPITAL_COMMUNITY)
Admission: RE | Admit: 2014-08-18 | Discharge: 2014-08-18 | Disposition: A | Discharge status: Home / Self Care | Payer: Self-pay | Source: Ambulatory Visit | Attending: Cardiovascular Disease | Admitting: Cardiovascular Disease

## 2014-08-20 ENCOUNTER — Encounter (HOSPITAL_COMMUNITY)
Admission: RE | Admit: 2014-08-20 | Discharge: 2014-08-20 | Disposition: A | Discharge status: Home / Self Care | Payer: Self-pay | Source: Ambulatory Visit | Attending: Cardiovascular Disease | Admitting: Cardiovascular Disease

## 2014-08-20 DIAGNOSIS — Z955 Presence of coronary angioplasty implant and graft: Secondary | ICD-10-CM | POA: Insufficient documentation

## 2014-08-20 DIAGNOSIS — Z5189 Encounter for other specified aftercare: Secondary | ICD-10-CM | POA: Insufficient documentation

## 2014-08-20 DIAGNOSIS — I10 Essential (primary) hypertension: Secondary | ICD-10-CM | POA: Insufficient documentation

## 2014-08-20 DIAGNOSIS — E785 Hyperlipidemia, unspecified: Secondary | ICD-10-CM | POA: Insufficient documentation

## 2014-08-20 DIAGNOSIS — I2510 Atherosclerotic heart disease of native coronary artery without angina pectoris: Secondary | ICD-10-CM | POA: Insufficient documentation

## 2014-08-20 DIAGNOSIS — I4892 Unspecified atrial flutter: Secondary | ICD-10-CM | POA: Insufficient documentation

## 2014-08-20 DIAGNOSIS — I472 Ventricular tachycardia: Secondary | ICD-10-CM | POA: Insufficient documentation

## 2014-08-25 ENCOUNTER — Encounter (HOSPITAL_COMMUNITY)
Admission: RE | Admit: 2014-08-25 | Discharge: 2014-08-25 | Disposition: A | Discharge status: Home / Self Care | Payer: Self-pay | Source: Ambulatory Visit | Attending: Cardiovascular Disease | Admitting: Cardiovascular Disease

## 2014-08-26 ENCOUNTER — Other Ambulatory Visit: Payer: Self-pay | Admitting: Physician Assistant

## 2014-08-26 DIAGNOSIS — E349 Endocrine disorder, unspecified: Secondary | ICD-10-CM

## 2014-08-27 ENCOUNTER — Encounter (HOSPITAL_COMMUNITY)
Admission: RE | Admit: 2014-08-27 | Discharge: 2014-08-27 | Disposition: A | Discharge status: Home / Self Care | Payer: Self-pay | Source: Ambulatory Visit | Attending: Cardiovascular Disease | Admitting: Cardiovascular Disease

## 2014-08-30 ENCOUNTER — Encounter (HOSPITAL_COMMUNITY)
Admission: RE | Admit: 2014-08-30 | Discharge: 2014-08-30 | Disposition: A | Discharge status: Home / Self Care | Payer: Self-pay | Source: Ambulatory Visit | Attending: Cardiovascular Disease | Admitting: Cardiovascular Disease

## 2014-09-01 ENCOUNTER — Encounter (HOSPITAL_COMMUNITY)
Admission: RE | Admit: 2014-09-01 | Discharge: 2014-09-01 | Disposition: A | Discharge status: Home / Self Care | Payer: Self-pay | Source: Ambulatory Visit | Attending: Cardiovascular Disease | Admitting: Cardiovascular Disease

## 2014-09-03 ENCOUNTER — Encounter (HOSPITAL_COMMUNITY)
Admission: RE | Admit: 2014-09-03 | Discharge: 2014-09-03 | Disposition: A | Discharge status: Home / Self Care | Payer: Self-pay | Source: Ambulatory Visit | Attending: Cardiovascular Disease | Admitting: Cardiovascular Disease

## 2014-09-06 ENCOUNTER — Encounter (HOSPITAL_COMMUNITY)
Admission: RE | Admit: 2014-09-06 | Discharge: 2014-09-06 | Disposition: A | Discharge status: Home / Self Care | Payer: Self-pay | Source: Ambulatory Visit | Attending: Cardiovascular Disease | Admitting: Cardiovascular Disease

## 2014-09-08 ENCOUNTER — Encounter (HOSPITAL_COMMUNITY)
Admission: RE | Admit: 2014-09-08 | Discharge: 2014-09-08 | Disposition: A | Discharge status: Home / Self Care | Payer: Self-pay | Source: Ambulatory Visit | Attending: Cardiovascular Disease | Admitting: Cardiovascular Disease

## 2014-09-08 ENCOUNTER — Ambulatory Visit (INDEPENDENT_AMBULATORY_CARE_PROVIDER_SITE_OTHER): Payer: 59 | Admitting: Physician Assistant

## 2014-09-08 ENCOUNTER — Encounter: Payer: Self-pay | Admitting: Physician Assistant

## 2014-09-08 VITALS — BP 130/82 | HR 56 | Temp 97.5°F | Resp 16 | Ht 68.0 in | Wt 207.0 lb

## 2014-09-08 DIAGNOSIS — E291 Testicular hypofunction: Secondary | ICD-10-CM

## 2014-09-08 DIAGNOSIS — E559 Vitamin D deficiency, unspecified: Secondary | ICD-10-CM

## 2014-09-08 DIAGNOSIS — R7303 Prediabetes: Secondary | ICD-10-CM

## 2014-09-08 DIAGNOSIS — Z955 Presence of coronary angioplasty implant and graft: Secondary | ICD-10-CM

## 2014-09-08 DIAGNOSIS — R7309 Other abnormal glucose: Secondary | ICD-10-CM

## 2014-09-08 DIAGNOSIS — I10 Essential (primary) hypertension: Secondary | ICD-10-CM

## 2014-09-08 DIAGNOSIS — E785 Hyperlipidemia, unspecified: Secondary | ICD-10-CM

## 2014-09-08 DIAGNOSIS — R239 Unspecified skin changes: Secondary | ICD-10-CM

## 2014-09-08 DIAGNOSIS — Z79899 Other long term (current) drug therapy: Secondary | ICD-10-CM

## 2014-09-08 LAB — LIPID PANEL
CHOLESTEROL: 131 mg/dL (ref 0–200)
HDL: 54 mg/dL (ref >=40)
LDL CALC: 64 mg/dL (ref 0–99)
Total CHOL/HDL Ratio: 2.4 Ratio
Triglycerides: 65 mg/dL (ref <150)
VLDL: 13 mg/dL (ref 0–40)

## 2014-09-08 LAB — CBC WITH DIFFERENTIAL/PLATELET
Basophils Absolute: 0 10*3/uL (ref 0.0–0.1)
Basophils Relative: 0 % (ref 0–1)
Eosinophils Absolute: 0.2 10*3/uL (ref 0.0–0.7)
Eosinophils Relative: 4 % (ref 0–5)
HEMATOCRIT: 41.9 % (ref 39.0–52.0)
HEMOGLOBIN: 14.5 g/dL (ref 13.0–17.0)
LYMPHS PCT: 36 % (ref 12–46)
Lymphs Abs: 2 10*3/uL (ref 0.7–4.0)
MCH: 33 pg (ref 26.0–34.0)
MCHC: 34.6 g/dL (ref 30.0–36.0)
MCV: 95.4 fL (ref 78.0–100.0)
MONO ABS: 0.6 10*3/uL (ref 0.1–1.0)
MPV: 10.1 fL (ref 8.6–12.4)
Monocytes Relative: 11 % (ref 3–12)
NEUTROS PCT: 49 % (ref 43–77)
Neutro Abs: 2.7 10*3/uL (ref 1.7–7.7)
PLATELETS: 193 10*3/uL (ref 150–400)
RBC: 4.39 MIL/uL (ref 4.22–5.81)
RDW: 12.8 % (ref 11.5–15.5)
WBC: 5.6 10*3/uL (ref 4.0–10.5)

## 2014-09-08 LAB — BASIC METABOLIC PANEL WITH GFR
BUN: 22 mg/dL (ref 6–23)
CO2: 23 mEq/L (ref 19–32)
Calcium: 9.7 mg/dL (ref 8.4–10.5)
Chloride: 102 mEq/L (ref 96–112)
Creat: 1.44 mg/dL — ABNORMAL HIGH (ref 0.50–1.35)
GFR, EST AFRICAN AMERICAN: 61 mL/min
GFR, Est Non African American: 53 mL/min — ABNORMAL LOW
Glucose, Bld: 101 mg/dL — ABNORMAL HIGH (ref 70–99)
POTASSIUM: 4.1 meq/L (ref 3.5–5.3)
Sodium: 137 mEq/L (ref 135–145)

## 2014-09-08 LAB — HEMOGLOBIN A1C
HEMOGLOBIN A1C: 5.4 % (ref <5.7)
Mean Plasma Glucose: 108 mg/dL (ref <117)

## 2014-09-08 LAB — MAGNESIUM: Magnesium: 1.6 mg/dL (ref 1.5–2.5)

## 2014-09-08 LAB — HEPATIC FUNCTION PANEL
ALT: 21 U/L (ref 0–53)
AST: 29 U/L (ref 0–37)
Albumin: 4.5 g/dL (ref 3.5–5.2)
Alkaline Phosphatase: 32 U/L — ABNORMAL LOW (ref 39–117)
BILIRUBIN TOTAL: 0.7 mg/dL (ref 0.2–1.2)
Bilirubin, Direct: 0.2 mg/dL (ref 0.0–0.3)
Indirect Bilirubin: 0.5 mg/dL (ref 0.2–1.2)
TOTAL PROTEIN: 7 g/dL (ref 6.0–8.3)

## 2014-09-08 LAB — TSH: TSH: 0.906 u[IU]/mL (ref 0.350–4.500)

## 2014-09-08 NOTE — Progress Notes (Signed)
Assessment and Plan:  Hypertension: Continue medication, monitor blood pressure at home. Continue DASH diet.  Reminder to go to the ER if any CP, SOB, nausea, dizziness, severe HA, changes vision/speech, left arm numbness and tingling, and jaw pain. Cholesterol: Continue diet and exercise. Check cholesterol.  Pre-diabetes-Continue diet and exercise. Check A1C Vitamin D Def- check level and continue medications.  CAD-Control blood pressure, cholesterol, glucose, increase exercise.  Obesity with co morbidities- long discussion about weight loss, diet, and exercise Hypogonadism- continue replacement therapy, check testosterone levels as needed.  Left ear scaly macule- put vaseline on it at night since laying on that side, if not better will refer to derm  Continue diet and meds as discussed. Further disposition pending results of labs.  HPI 59 y.o. male  presents for 3 month follow up with hypertension, hyperlipidemia, prediabetes and vitamin D. His blood pressure has been controlled at home, today their BP is BP: 130/82 mmHg He does workout. He denies chest pain, shortness of breath, dizziness.  Recently started on doxazosin for BPH which has helped.  He has CAD, had stent in Nov 2012, on plavix and bASA, no cardiac symptoms, doing cardiac rehab 2 x a week.  He is on cholesterol medication, fenofibrate and lipitor 40mg  and denies myalgias. His cholesterol is not at goal. The cholesterol last visit was:   Lab Results  Component Value Date   CHOL 147 06/01/2014   HDL 42 06/01/2014   LDLCALC 85 06/01/2014   TRIG 100 06/01/2014   CHOLHDL 3.5 06/01/2014   He has been working on diet and exercise for prediabetes, and denies paresthesia of the feet, polydipsia, polyuria and visual disturbances. Last A1C in the office was:  Lab Results  Component Value Date   HGBA1C 5.6 06/01/2014  Patient is on Vitamin D supplement.   Lab Results  Component Value Date   VD25OH 76 06/01/2014  BMI is Body  mass index is 31.48 kg/(m^2)., he is working on diet and exercise. Wt Readings from Last 3 Encounters:  09/08/14 207 lb (93.895 kg)  07/29/14 205 lb (92.987 kg)  06/28/14 199 lb 12.8 oz (90.629 kg)  He has a history of testosterone deficiency and is on testosterone replacement, last injection was friday, 2 cc. He states that the testosterone helps with his energy, libido, muscle mass. Lab Results  Component Value Date   TESTOSTERONE 399 05/24/2014     Current Medications:  Current Outpatient Prescriptions on File Prior to Visit  Medication Sig Dispense Refill  . aspirin 81 MG tablet Take 81 mg by mouth daily.    Marland Kitchen atenolol (TENORMIN) 100 MG tablet Take 1 tablet (100 mg total) by mouth daily. 90 tablet prn  . atorvastatin (LIPITOR) 40 MG tablet Take 1 tablet by mouth  every day for cholesterol 90 tablet 2  . b complex vitamins tablet Take 1 tablet by mouth daily.    . B-D 3CC LUER-LOK SYR 21GX1" 21G X 1" 3 ML MISC USE WITH TESTOSTERONE AS DIRECTED 12 each 3  . BENICAR 40 MG tablet Take 1 tablet by mouth  daily for blood pressure 90 tablet 2  . buPROPion (WELLBUTRIN XL) 300 MG 24 hr tablet Take 1 tablet by mouth  daily for mood 90 tablet 2  . clopidogrel (PLAVIX) 75 MG tablet Take 1 tablet (75 mg total) by mouth daily. 90 tablet prn  . diltiazem (CARDIZEM CD) 240 MG 24 hr capsule Take 1 capsule by mouth  daily for blood pressure 90 capsule 2  .  doxazosin (CARDURA) 8 MG tablet Take 1/2 to 1 tablet daily for BP & Prostate 90 tablet 1  . fenofibrate 160 MG tablet Take 1 tablet (160 mg total) by mouth daily. 90 tablet 2  . fish oil-omega-3 fatty acids 1000 MG capsule Take 1 g by mouth daily. Takes 3 gm daily    . furosemide (LASIX) 80 MG tablet Take 1 tablet by mouth   daily for blood pressure   and fluid 90 tablet 1  . HYDROcodone-acetaminophen (NORCO) 10-325 MG per tablet   0  . meloxicam (MOBIC) 15 MG tablet TAKE 1 TABLET BY MOUTH EVERY DAY FOR PAIN/INFLAMATION 90 tablet 4  . Multiple  Vitamin (MULTIVITAMIN WITH MINERALS) TABS Take 2 tablets by mouth daily.     . pantoprazole (PROTONIX) 40 MG tablet Take 1 tablet (40 mg total) by mouth daily. 90 tablet 3  . ranitidine (ZANTAC) 300 MG tablet Take 1 to 2 tablets daily to facilitate tapering off PPI's for heartburn/acid indigestion 90 tablet 99  . testosterone cypionate (DEPOTESTOSTERONE CYPIONATE) 200 MG/ML injection USE 2ML INTRAMUSCULARLY EVERY 12 DAYS 10 mL 5   No current facility-administered medications on file prior to visit.   Medical History:  Past Medical History  Diagnosis Date  . Hypertension   . Hyperlipidemia   . Diverticulitis   . Hemorrhoids   . GERD (gastroesophageal reflux disease)   . Anxiety   . Meniscus tear 01/02/2011  . Right knee pain 01/02/2011  . Diverticulosis   . Hemarthrosis of knee, right 01/04/2011  . CAD (coronary artery disease), native coronary artery 01/04/2011  . Stented coronary artery 01/04/2011    LAD DES Resolute.  . Hyperlipemia 01/04/2011  . History of meniscectomy of right knee 01/02/2011  . Torn medial meniscus     right knee  . NSVT (nonsustained ventricular tachycardia) 01/02/2011  . Atrial flutter, paroxysmal 01/04/2011  . Arthritis    Allergies:  Allergies  Allergen Reactions  . Hctz [Hydrochlorothiazide]   . Ibuprofen   . Tape Rash    Clear tape     Review of Systems:  Review of Systems  Constitutional: Negative.   HENT: Negative.   Cardiovascular: Negative.   Gastrointestinal: Negative.   Genitourinary: Negative.   Musculoskeletal: Positive for joint pain (bilateral knee pain, has had injections. ). Negative for myalgias, back pain, falls and neck pain.  Skin: Negative.   Neurological: Negative.   Psychiatric/Behavioral: Negative.    Family history- Review and unchanged Social history- Review and unchanged Physical Exam: BP 130/82 mmHg  Pulse 56  Temp(Src) 97.5 F (36.4 C)  Resp 16  Ht 5\' 8"  (1.727 m)  Wt 207 lb (93.895 kg)  BMI 31.48  kg/m2 Wt Readings from Last 3 Encounters:  09/08/14 207 lb (93.895 kg)  07/29/14 205 lb (92.987 kg)  06/28/14 199 lb 12.8 oz (90.629 kg)   General Appearance: Well nourished, in no apparent distress. Eyes: PERRLA, EOMs, conjunctiva no swelling or erythema Sinuses: No Frontal/maxillary tenderness ENT/Mouth: Left cauliflower ear, Ext aud canals clear, TMs without erythema, bulging. No erythema, swelling, or exudate on post pharynx.  Tonsils not swollen or erythematous. Hearing normal.  Neck: Supple, thyroid normal.  Respiratory: Respiratory effort normal, BS equal bilaterally without rales, rhonchi, wheezing or stridor.  Cardio: RRR with no MRGs. Brisk peripheral pulses without edema.  Abdomen: Soft, + BS.  Non tender, no guarding, rebound, hernias, masses. Lymphatics: Non tender without lymphadenopathy.  Musculoskeletal: Full ROM, 5/5 strength, normal gait.  Skin: Left ear with dry  scaly macule on left ear. Warm, dry without rashes, lesions, ecchymosis.  Neuro: Cranial nerves intact. Normal muscle tone, no cerebellar symptoms. Sensation intact.  Psych: Awake and oriented X 3, normal affect, Insight and Judgment appropriate.    Vicie Mutters, PA-C 9:40 AM [facility]

## 2014-09-08 NOTE — Patient Instructions (Addendum)
Benefiber is good for constipation/diarrhea/irritable bowel syndrome, it helps with weight loss and can help lower your bad cholesterol. Please do 1-2 TBSP in the morning in water, coffee, or tea. It can take up to a month before you can see a difference with your bowel movements. It is cheapest from [employer], [employer], [employer].   Cholesterol Cholesterol is a white, waxy, fat-like substance needed by your body in small amounts. The liver makes all the cholesterol you need. Cholesterol is carried from the liver by the blood through the blood vessels. Deposits of cholesterol (plaque) may build up on blood vessel walls. These make the arteries narrower and stiffer. Cholesterol plaques increase the risk for heart attack and stroke.  You cannot feel your cholesterol level even if it is very high. The only way to know it is high is with a blood test. Once you know your cholesterol levels, you should keep a record of the test results. Work with your health care provider to keep your levels in the desired range.  WHAT DO THE RESULTS MEAN?  Total cholesterol is a rough measure of all the cholesterol in your blood.   LDL is the so-called bad cholesterol. This is the type that deposits cholesterol in the walls of the arteries. You want this level to be low.   HDL is the good cholesterol because it cleans the arteries and carries the LDL away. You want this level to be high.  Triglycerides are fat that the body can either burn for energy or store. High levels are closely linked to heart disease.  WHAT ARE THE DESIRED LEVELS OF CHOLESTEROL?  Total cholesterol below 200.   LDL below 100 for people at risk, below 70 for those at very high risk.   HDL above 50 is good, above 60 is best.   Triglycerides below 150.  HOW CAN I LOWER MY CHOLESTEROL?  Diet. Follow your diet programs as directed by your health care provider.   Choose fish or white meat chicken and [place], roasted or baked. Limit fatty cuts  of red meat, fried foods, and processed meats, such as sausage and lunch meats.   Eat lots of fresh fruits and vegetables.  Choose whole grains, beans, pasta, potatoes, and cereals.   Use only small amounts of olive, corn, or canola oils.   Avoid butter, mayonnaise, shortening, or palm kernel oils.  Avoid foods with trans fats.   Drink skim or nonfat milk and eat low-fat or nonfat yogurt and cheeses. Avoid whole milk, cream, ice cream, egg yolks, and full-fat cheeses.   Healthy desserts include angel food cake, ginger snaps, animal crackers, hard candy, popsicles, and low-fat or nonfat frozen yogurt. Avoid pastries, cakes, pies, and cookies.   Exercise. Follow your exercise programs as directed by your health care provider.   A regular program helps decrease LDL and raise HDL.   A regular program helps with weight control.   Do things that increase your activity level like gardening, walking, or taking the stairs. Ask your health care provider about how you can be more active in your daily life.   Medicine. Take medicine only as directed by your health care provider.   Medicine may be prescribed by your health care provider to help lower cholesterol and decrease the risk for heart disease.   If you have several risk factors, you may need medicine even if your levels are normal. Document Released: 10/31/2000 Document Revised: 06/22/2013 Document Reviewed: 11/19/2012 [facility] Patient Information 2015 [place], [place].  This information is not intended to replace advice given to you by your health care provider. Make sure you discuss any questions you have with your health care provider.   Squamous Cell Carcinoma  Squamous cell carcinoma is the second most common form of skin cancer. It begins in the squamous cells in the outer layer of the skin (epidermis).  CAUSES  Ultraviolet light exposure is the most common cause of squamous cell carcinoma. This may come from sunlight  or tanning beds. Squamous cell carcinoma is most common in sun-exposed areas like the face, neck, arms, and hands. However, squamous cell carcinoma can occur anywhere on the body, including the lips, inside the mouth, the legs, sites of long-term (chronic) scarring, and the anus.  Other causes of squamous cell carcinoma can include:  Exposure to arsenic.  Exposure to radiation.  Exposure to toxic tars and oils. RISK FACTORS Factors that increase your risk for squamous cell carcinoma include:  Having fair skin.  Being middle-aged or elderly.  Heavy sun exposure, especially during childhood.  Repeated sunburns.  Use of tanning beds.  A weakened immune system. This includes patients who have received a transplant and patients with human immunodeficiency virus (HIV) or acquired immunodeficiency syndrome (AIDS).  Human papillomavirus infection.  Conditions that cause chronic scarring. This can include burn scars, chronic ulcers, heat (thermal) injuries, and radiation.  Exposure to psoralen plus ultraviolet A light therapy.  Exposure to chemical carcinogens, such as tar, soot, and arsenic.  Chronic inflammatory conditions such as lupus, lichen planus, or lichen sclerosus.  Chronic infections, such as infections of the bone (osteomyelitis).  Smoking. SYMPTOMS  Squamous cell carcinoma often starts as small, skin-colored (pink or brown), sandpaper-like growths. These growths are called solar keratoses or actinic keratoses. These growths are often more easily felt than seen.  DIAGNOSIS  Your caregiver may be able to tell what is wrong by doing a physical exam. Often, a tissue sample is also taken. The tissue sample is examined under a microscope.  TREATMENT  The treatment for squamous cell carcinoma depends on the size and location of the tumors, as well as your overall health. Possible treatments include:   Mohs surgery. This is a procedure done by a skin doctor (dermatologist or  Mohs surgeon) in his or her office. The cancerous cells are removed layer by layer.  Laser surgery to remove the tumor.  Freezing the tumor with liquid nitrogen (cryosurgery).  Radiation. This may be used for tumors on the face.  Electrodesiccation and curettage. This involves alternately scraping and burning the tumor, using an electric current to control bleeding. If treated soon enough, squamous cell carcinoma rarely spreads to other areas of the body (metastasizes). If left untreated, however, squamous cell carcinoma will destroy the nearby tissues. This can result in the loss of a nose or ear. PREVENTION  Avoid the sun between 10:00 a.m. and 4:00 p.m. when it is the strongest.  Use a sunscreen or sunblock with sun protection factor 30 or greater.  Apply sunscreen at least 30 minutes before exposure to the sun.  Reapply sunscreen every 2 to 4 hours while you are outside, after swimming, and after excessive sweating.  Always wear protective hats, clothing, and sunglasses with ultraviolet protection.  Avoid tanning beds. HOME CARE INSTRUCTIONS   Avoid unprotected sun exposure.  Do not smoke.  Follow your caregiver's instructions for self-exams. Look for new growths or changes in your skin.  Keep all follow-up appointments as directed by your caregiver. SEEK  MEDICAL CARE IF:   You notice any new growths or changes in your skin.  You have had a squamous cell carcinoma tumor removed and you notice a new growth in the same location. Document Released: 08/12/2002 Document Revised: 06/22/2013 Document Reviewed: 10/30/2010 [facility] Patient Information 2015 [place], [place]. This information is not intended to replace advice given to you by your health care provider. Make sure you discuss any questions you have with your health care provider.

## 2014-09-09 LAB — VITAMIN D 25 HYDROXY (VIT D DEFICIENCY, FRACTURES): Vit D, 25-Hydroxy: 82 ng/mL (ref 30–100)

## 2014-09-09 LAB — INSULIN, FASTING: Insulin fasting, serum: 2.3 u[IU]/mL (ref 2.0–19.6)

## 2014-09-10 ENCOUNTER — Encounter (HOSPITAL_COMMUNITY)
Admission: RE | Admit: 2014-09-10 | Discharge: 2014-09-10 | Disposition: A | Discharge status: Home / Self Care | Payer: Self-pay | Source: Ambulatory Visit | Attending: Cardiovascular Disease | Admitting: Cardiovascular Disease

## 2014-09-13 ENCOUNTER — Other Ambulatory Visit: Payer: Self-pay | Admitting: Internal Medicine

## 2014-09-13 ENCOUNTER — Encounter (HOSPITAL_COMMUNITY): Payer: Self-pay

## 2014-09-15 ENCOUNTER — Encounter (HOSPITAL_COMMUNITY)
Admission: RE | Admit: 2014-09-15 | Discharge: 2014-09-15 | Disposition: A | Discharge status: Home / Self Care | Payer: Self-pay | Source: Ambulatory Visit | Attending: Cardiovascular Disease | Admitting: Cardiovascular Disease

## 2014-09-17 ENCOUNTER — Encounter (HOSPITAL_COMMUNITY)
Admission: RE | Admit: 2014-09-17 | Discharge: 2014-09-17 | Disposition: A | Discharge status: Home / Self Care | Payer: Self-pay | Source: Ambulatory Visit | Attending: Cardiovascular Disease | Admitting: Cardiovascular Disease

## 2014-09-20 ENCOUNTER — Encounter (HOSPITAL_COMMUNITY)
Admission: RE | Admit: 2014-09-20 | Discharge: 2014-09-20 | Disposition: A | Discharge status: Home / Self Care | Payer: Self-pay | Source: Ambulatory Visit | Attending: Cardiovascular Disease | Admitting: Cardiovascular Disease

## 2014-09-20 DIAGNOSIS — E785 Hyperlipidemia, unspecified: Secondary | ICD-10-CM | POA: Insufficient documentation

## 2014-09-20 DIAGNOSIS — I10 Essential (primary) hypertension: Secondary | ICD-10-CM | POA: Insufficient documentation

## 2014-09-20 DIAGNOSIS — I472 Ventricular tachycardia: Secondary | ICD-10-CM | POA: Insufficient documentation

## 2014-09-20 DIAGNOSIS — Z5189 Encounter for other specified aftercare: Secondary | ICD-10-CM | POA: Insufficient documentation

## 2014-09-20 DIAGNOSIS — Z955 Presence of coronary angioplasty implant and graft: Secondary | ICD-10-CM | POA: Insufficient documentation

## 2014-09-20 DIAGNOSIS — I4892 Unspecified atrial flutter: Secondary | ICD-10-CM | POA: Insufficient documentation

## 2014-09-20 DIAGNOSIS — I2510 Atherosclerotic heart disease of native coronary artery without angina pectoris: Secondary | ICD-10-CM | POA: Insufficient documentation

## 2014-09-22 ENCOUNTER — Encounter (HOSPITAL_COMMUNITY)
Admission: RE | Admit: 2014-09-22 | Discharge: 2014-09-22 | Disposition: A | Discharge status: Home / Self Care | Payer: Self-pay | Source: Ambulatory Visit | Attending: Cardiovascular Disease | Admitting: Cardiovascular Disease

## 2014-09-24 ENCOUNTER — Encounter (HOSPITAL_COMMUNITY)
Admission: RE | Admit: 2014-09-24 | Discharge: 2014-09-24 | Disposition: A | Discharge status: Home / Self Care | Payer: Self-pay | Source: Ambulatory Visit | Attending: Cardiovascular Disease | Admitting: Cardiovascular Disease

## 2014-09-27 ENCOUNTER — Encounter (HOSPITAL_COMMUNITY)
Admission: RE | Admit: 2014-09-27 | Discharge: 2014-09-27 | Disposition: A | Discharge status: Home / Self Care | Payer: Self-pay | Source: Ambulatory Visit | Attending: Cardiovascular Disease | Admitting: Cardiovascular Disease

## 2014-09-29 ENCOUNTER — Encounter (HOSPITAL_COMMUNITY)
Admission: RE | Admit: 2014-09-29 | Discharge: 2014-09-29 | Disposition: A | Discharge status: Home / Self Care | Payer: Self-pay | Source: Ambulatory Visit | Attending: Cardiovascular Disease | Admitting: Cardiovascular Disease

## 2014-10-01 ENCOUNTER — Encounter (HOSPITAL_COMMUNITY)
Admission: RE | Admit: 2014-10-01 | Discharge: 2014-10-01 | Disposition: A | Discharge status: Home / Self Care | Payer: Self-pay | Source: Ambulatory Visit | Attending: Cardiovascular Disease | Admitting: Cardiovascular Disease

## 2014-10-04 ENCOUNTER — Encounter (HOSPITAL_COMMUNITY)
Admission: RE | Admit: 2014-10-04 | Discharge: 2014-10-04 | Disposition: A | Discharge status: Home / Self Care | Payer: Self-pay | Source: Ambulatory Visit | Attending: Cardiovascular Disease | Admitting: Cardiovascular Disease

## 2014-10-06 ENCOUNTER — Encounter (HOSPITAL_COMMUNITY)
Admission: RE | Admit: 2014-10-06 | Discharge: 2014-10-06 | Disposition: A | Discharge status: Home / Self Care | Payer: Self-pay | Source: Ambulatory Visit | Attending: Cardiovascular Disease | Admitting: Cardiovascular Disease

## 2014-10-08 ENCOUNTER — Encounter (HOSPITAL_COMMUNITY): Payer: Self-pay

## 2014-10-11 ENCOUNTER — Encounter (HOSPITAL_COMMUNITY)
Admission: RE | Admit: 2014-10-11 | Discharge: 2014-10-11 | Disposition: A | Discharge status: Home / Self Care | Payer: Self-pay | Source: Ambulatory Visit | Attending: Cardiovascular Disease | Admitting: Cardiovascular Disease

## 2014-10-13 ENCOUNTER — Encounter (HOSPITAL_COMMUNITY)
Admission: RE | Admit: 2014-10-13 | Discharge: 2014-10-13 | Disposition: A | Discharge status: Home / Self Care | Payer: Self-pay | Source: Ambulatory Visit | Attending: Cardiovascular Disease | Admitting: Cardiovascular Disease

## 2014-10-14 ENCOUNTER — Other Ambulatory Visit: Payer: Self-pay | Admitting: Internal Medicine

## 2014-10-15 ENCOUNTER — Encounter (HOSPITAL_COMMUNITY)
Admission: RE | Admit: 2014-10-15 | Discharge: 2014-10-15 | Disposition: A | Discharge status: Home / Self Care | Payer: Self-pay | Source: Ambulatory Visit | Attending: Cardiovascular Disease | Admitting: Cardiovascular Disease

## 2014-10-18 ENCOUNTER — Encounter (HOSPITAL_COMMUNITY)
Admission: RE | Admit: 2014-10-18 | Discharge: 2014-10-18 | Disposition: A | Discharge status: Home / Self Care | Payer: Self-pay | Source: Ambulatory Visit | Attending: Cardiovascular Disease | Admitting: Cardiovascular Disease

## 2014-10-20 ENCOUNTER — Encounter (HOSPITAL_COMMUNITY)
Admission: RE | Admit: 2014-10-20 | Discharge: 2014-10-20 | Disposition: A | Discharge status: Home / Self Care | Payer: Self-pay | Source: Ambulatory Visit | Attending: Cardiovascular Disease | Admitting: Cardiovascular Disease

## 2014-10-22 ENCOUNTER — Encounter (HOSPITAL_COMMUNITY): Payer: 59

## 2014-10-22 DIAGNOSIS — E785 Hyperlipidemia, unspecified: Secondary | ICD-10-CM | POA: Insufficient documentation

## 2014-10-22 DIAGNOSIS — Z955 Presence of coronary angioplasty implant and graft: Secondary | ICD-10-CM | POA: Insufficient documentation

## 2014-10-22 DIAGNOSIS — I2510 Atherosclerotic heart disease of native coronary artery without angina pectoris: Secondary | ICD-10-CM | POA: Insufficient documentation

## 2014-10-22 DIAGNOSIS — I472 Ventricular tachycardia: Secondary | ICD-10-CM | POA: Insufficient documentation

## 2014-10-22 DIAGNOSIS — I10 Essential (primary) hypertension: Secondary | ICD-10-CM | POA: Insufficient documentation

## 2014-10-22 DIAGNOSIS — I4892 Unspecified atrial flutter: Secondary | ICD-10-CM | POA: Insufficient documentation

## 2014-10-22 DIAGNOSIS — Z5189 Encounter for other specified aftercare: Secondary | ICD-10-CM | POA: Insufficient documentation

## 2014-10-27 ENCOUNTER — Encounter (HOSPITAL_COMMUNITY): Payer: Self-pay

## 2014-10-29 ENCOUNTER — Encounter (HOSPITAL_COMMUNITY)
Admission: RE | Admit: 2014-10-29 | Discharge: 2014-10-29 | Disposition: A | Discharge status: Home / Self Care | Payer: Self-pay | Source: Ambulatory Visit | Attending: Cardiovascular Disease | Admitting: Cardiovascular Disease

## 2014-11-01 ENCOUNTER — Encounter (HOSPITAL_COMMUNITY)
Admission: RE | Admit: 2014-11-01 | Discharge: 2014-11-01 | Disposition: A | Discharge status: Home / Self Care | Payer: Self-pay | Source: Ambulatory Visit | Attending: Cardiovascular Disease | Admitting: Cardiovascular Disease

## 2014-11-03 ENCOUNTER — Encounter (HOSPITAL_COMMUNITY)
Admission: RE | Admit: 2014-11-03 | Discharge: 2014-11-03 | Disposition: A | Discharge status: Home / Self Care | Payer: Self-pay | Source: Ambulatory Visit | Attending: Cardiovascular Disease | Admitting: Cardiovascular Disease

## 2014-11-05 ENCOUNTER — Encounter (HOSPITAL_COMMUNITY)
Admission: RE | Admit: 2014-11-05 | Discharge: 2014-11-05 | Disposition: A | Discharge status: Home / Self Care | Payer: Self-pay | Source: Ambulatory Visit | Attending: Cardiovascular Disease | Admitting: Cardiovascular Disease

## 2014-11-08 ENCOUNTER — Encounter (HOSPITAL_COMMUNITY): Payer: Self-pay

## 2014-11-09 ENCOUNTER — Encounter: Payer: Self-pay | Admitting: Internal Medicine

## 2014-11-10 ENCOUNTER — Encounter (HOSPITAL_COMMUNITY): Payer: Self-pay

## 2014-11-12 ENCOUNTER — Encounter (HOSPITAL_COMMUNITY)
Admission: RE | Admit: 2014-11-12 | Discharge: 2014-11-12 | Disposition: A | Discharge status: Home / Self Care | Payer: Self-pay | Source: Ambulatory Visit | Attending: Cardiovascular Disease | Admitting: Cardiovascular Disease

## 2014-11-15 ENCOUNTER — Encounter (HOSPITAL_COMMUNITY)
Admission: RE | Admit: 2014-11-15 | Discharge: 2014-11-15 | Disposition: A | Discharge status: Home / Self Care | Payer: Self-pay | Source: Ambulatory Visit | Attending: Cardiovascular Disease | Admitting: Cardiovascular Disease

## 2014-11-17 ENCOUNTER — Encounter (HOSPITAL_COMMUNITY): Payer: Self-pay

## 2014-11-19 ENCOUNTER — Encounter (HOSPITAL_COMMUNITY): Admission: RE | Admit: 2014-11-19 | Payer: 59 | Source: Ambulatory Visit

## 2014-11-22 ENCOUNTER — Encounter (HOSPITAL_COMMUNITY)
Admission: RE | Admit: 2014-11-22 | Discharge: 2014-11-22 | Disposition: A | Discharge status: Home / Self Care | Payer: Self-pay | Source: Ambulatory Visit | Attending: Cardiovascular Disease | Admitting: Cardiovascular Disease

## 2014-11-22 DIAGNOSIS — I472 Ventricular tachycardia: Secondary | ICD-10-CM | POA: Insufficient documentation

## 2014-11-22 DIAGNOSIS — E785 Hyperlipidemia, unspecified: Secondary | ICD-10-CM | POA: Insufficient documentation

## 2014-11-22 DIAGNOSIS — I10 Essential (primary) hypertension: Secondary | ICD-10-CM | POA: Insufficient documentation

## 2014-11-22 DIAGNOSIS — I2510 Atherosclerotic heart disease of native coronary artery without angina pectoris: Secondary | ICD-10-CM | POA: Insufficient documentation

## 2014-11-22 DIAGNOSIS — Z955 Presence of coronary angioplasty implant and graft: Secondary | ICD-10-CM | POA: Insufficient documentation

## 2014-11-22 DIAGNOSIS — Z5189 Encounter for other specified aftercare: Secondary | ICD-10-CM | POA: Insufficient documentation

## 2014-11-22 DIAGNOSIS — I4892 Unspecified atrial flutter: Secondary | ICD-10-CM | POA: Insufficient documentation

## 2014-11-24 ENCOUNTER — Encounter (HOSPITAL_COMMUNITY)
Admission: RE | Admit: 2014-11-24 | Discharge: 2014-11-24 | Disposition: A | Discharge status: Home / Self Care | Payer: Self-pay | Source: Ambulatory Visit | Attending: Cardiovascular Disease | Admitting: Cardiovascular Disease

## 2014-11-26 ENCOUNTER — Encounter (HOSPITAL_COMMUNITY)
Admission: RE | Admit: 2014-11-26 | Discharge: 2014-11-26 | Disposition: A | Discharge status: Home / Self Care | Payer: Self-pay | Source: Ambulatory Visit | Attending: Cardiovascular Disease | Admitting: Cardiovascular Disease

## 2014-11-29 ENCOUNTER — Encounter (HOSPITAL_COMMUNITY)
Admission: RE | Admit: 2014-11-29 | Discharge: 2014-11-29 | Disposition: A | Discharge status: Home / Self Care | Payer: Self-pay | Source: Ambulatory Visit | Attending: Cardiovascular Disease | Admitting: Cardiovascular Disease

## 2014-12-01 ENCOUNTER — Encounter (HOSPITAL_COMMUNITY)
Admission: RE | Admit: 2014-12-01 | Discharge: 2014-12-01 | Disposition: A | Discharge status: Home / Self Care | Payer: Self-pay | Source: Ambulatory Visit | Attending: Cardiovascular Disease | Admitting: Cardiovascular Disease

## 2014-12-02 ENCOUNTER — Ambulatory Visit (INDEPENDENT_AMBULATORY_CARE_PROVIDER_SITE_OTHER): Payer: 59

## 2014-12-02 DIAGNOSIS — Z23 Encounter for immunization: Secondary | ICD-10-CM | POA: Diagnosis not present

## 2014-12-03 ENCOUNTER — Encounter (HOSPITAL_COMMUNITY): Payer: Self-pay

## 2014-12-06 ENCOUNTER — Encounter (HOSPITAL_COMMUNITY): Payer: Self-pay

## 2014-12-07 ENCOUNTER — Other Ambulatory Visit: Payer: Self-pay | Admitting: Internal Medicine

## 2014-12-08 ENCOUNTER — Encounter (HOSPITAL_COMMUNITY): Payer: Self-pay

## 2014-12-10 ENCOUNTER — Encounter (HOSPITAL_COMMUNITY)
Admission: RE | Admit: 2014-12-10 | Discharge: 2014-12-10 | Disposition: A | Discharge status: Home / Self Care | Payer: Self-pay | Source: Ambulatory Visit | Attending: Cardiovascular Disease | Admitting: Cardiovascular Disease

## 2014-12-13 ENCOUNTER — Encounter (HOSPITAL_COMMUNITY)
Admission: RE | Admit: 2014-12-13 | Discharge: 2014-12-13 | Disposition: A | Discharge status: Home / Self Care | Payer: Self-pay | Source: Ambulatory Visit | Attending: Cardiovascular Disease | Admitting: Cardiovascular Disease

## 2014-12-15 ENCOUNTER — Encounter (HOSPITAL_COMMUNITY)
Admission: RE | Admit: 2014-12-15 | Discharge: 2014-12-15 | Disposition: A | Discharge status: Home / Self Care | Payer: Self-pay | Source: Ambulatory Visit | Attending: Cardiovascular Disease | Admitting: Cardiovascular Disease

## 2014-12-17 ENCOUNTER — Encounter (HOSPITAL_COMMUNITY)
Admission: RE | Admit: 2014-12-17 | Discharge: 2014-12-17 | Disposition: A | Discharge status: Home / Self Care | Payer: Self-pay | Source: Ambulatory Visit | Attending: Cardiovascular Disease | Admitting: Cardiovascular Disease

## 2014-12-20 ENCOUNTER — Encounter (HOSPITAL_COMMUNITY)
Admission: RE | Admit: 2014-12-20 | Discharge: 2014-12-20 | Disposition: A | Discharge status: Home / Self Care | Payer: Self-pay | Source: Ambulatory Visit | Attending: Cardiovascular Disease | Admitting: Cardiovascular Disease

## 2014-12-22 ENCOUNTER — Encounter (HOSPITAL_COMMUNITY): Payer: 59

## 2014-12-22 DIAGNOSIS — I10 Essential (primary) hypertension: Secondary | ICD-10-CM | POA: Insufficient documentation

## 2014-12-22 DIAGNOSIS — I472 Ventricular tachycardia: Secondary | ICD-10-CM | POA: Insufficient documentation

## 2014-12-22 DIAGNOSIS — Z955 Presence of coronary angioplasty implant and graft: Secondary | ICD-10-CM | POA: Insufficient documentation

## 2014-12-22 DIAGNOSIS — I2510 Atherosclerotic heart disease of native coronary artery without angina pectoris: Secondary | ICD-10-CM | POA: Insufficient documentation

## 2014-12-22 DIAGNOSIS — I4892 Unspecified atrial flutter: Secondary | ICD-10-CM | POA: Insufficient documentation

## 2014-12-22 DIAGNOSIS — E785 Hyperlipidemia, unspecified: Secondary | ICD-10-CM | POA: Insufficient documentation

## 2014-12-22 DIAGNOSIS — Z5189 Encounter for other specified aftercare: Secondary | ICD-10-CM | POA: Insufficient documentation

## 2014-12-24 ENCOUNTER — Encounter (HOSPITAL_COMMUNITY)
Admission: RE | Admit: 2014-12-24 | Discharge: 2014-12-24 | Disposition: A | Discharge status: Home / Self Care | Payer: Self-pay | Source: Ambulatory Visit | Attending: Cardiovascular Disease | Admitting: Cardiovascular Disease

## 2014-12-27 ENCOUNTER — Encounter (HOSPITAL_COMMUNITY)
Admission: RE | Admit: 2014-12-27 | Discharge: 2014-12-27 | Disposition: A | Discharge status: Home / Self Care | Payer: Self-pay | Source: Ambulatory Visit | Attending: Cardiovascular Disease | Admitting: Cardiovascular Disease

## 2014-12-29 ENCOUNTER — Encounter (HOSPITAL_COMMUNITY): Payer: Self-pay

## 2014-12-31 ENCOUNTER — Encounter (HOSPITAL_COMMUNITY)
Admission: RE | Admit: 2014-12-31 | Discharge: 2014-12-31 | Disposition: A | Discharge status: Home / Self Care | Payer: Self-pay | Source: Ambulatory Visit | Attending: Cardiovascular Disease | Admitting: Cardiovascular Disease

## 2015-01-03 ENCOUNTER — Encounter (HOSPITAL_COMMUNITY): Payer: Self-pay

## 2015-01-04 ENCOUNTER — Encounter: Payer: Self-pay | Admitting: Internal Medicine

## 2015-01-04 ENCOUNTER — Ambulatory Visit (INDEPENDENT_AMBULATORY_CARE_PROVIDER_SITE_OTHER): Payer: 59 | Admitting: Internal Medicine

## 2015-01-04 VITALS — BP 130/76 | HR 64 | Temp 97.9°F | Resp 16 | Ht 68.0 in | Wt 201.2 lb

## 2015-01-04 DIAGNOSIS — Z111 Encounter for screening for respiratory tuberculosis: Secondary | ICD-10-CM | POA: Diagnosis not present

## 2015-01-04 DIAGNOSIS — Z0001 Encounter for general adult medical examination with abnormal findings: Secondary | ICD-10-CM

## 2015-01-04 DIAGNOSIS — Z125 Encounter for screening for malignant neoplasm of prostate: Secondary | ICD-10-CM

## 2015-01-04 DIAGNOSIS — I2510 Atherosclerotic heart disease of native coronary artery without angina pectoris: Secondary | ICD-10-CM

## 2015-01-04 DIAGNOSIS — Z6830 Body mass index (BMI) 30.0-30.9, adult: Secondary | ICD-10-CM

## 2015-01-04 DIAGNOSIS — Z79899 Other long term (current) drug therapy: Secondary | ICD-10-CM

## 2015-01-04 DIAGNOSIS — E559 Vitamin D deficiency, unspecified: Secondary | ICD-10-CM

## 2015-01-04 DIAGNOSIS — I10 Essential (primary) hypertension: Secondary | ICD-10-CM | POA: Diagnosis not present

## 2015-01-04 DIAGNOSIS — R7303 Prediabetes: Secondary | ICD-10-CM

## 2015-01-04 DIAGNOSIS — E785 Hyperlipidemia, unspecified: Secondary | ICD-10-CM

## 2015-01-04 DIAGNOSIS — K219 Gastro-esophageal reflux disease without esophagitis: Secondary | ICD-10-CM

## 2015-01-04 DIAGNOSIS — Z1212 Encounter for screening for malignant neoplasm of rectum: Secondary | ICD-10-CM

## 2015-01-04 DIAGNOSIS — Z0000 Encounter for general adult medical examination without abnormal findings: Secondary | ICD-10-CM | POA: Diagnosis not present

## 2015-01-04 DIAGNOSIS — R5383 Other fatigue: Secondary | ICD-10-CM

## 2015-01-04 DIAGNOSIS — E349 Endocrine disorder, unspecified: Secondary | ICD-10-CM

## 2015-01-04 LAB — CBC WITH DIFFERENTIAL/PLATELET
BASOS ABS: 0 10*3/uL (ref 0.0–0.1)
BASOS PCT: 0 % (ref 0–1)
EOS ABS: 0 10*3/uL (ref 0.0–0.7)
Eosinophils Relative: 0 % (ref 0–5)
HCT: 45.1 % (ref 39.0–52.0)
Hemoglobin: 16 g/dL (ref 13.0–17.0)
LYMPHS ABS: 1.1 10*3/uL (ref 0.7–4.0)
Lymphocytes Relative: 15 % (ref 12–46)
MCH: 33.4 pg (ref 26.0–34.0)
MCHC: 35.5 g/dL (ref 30.0–36.0)
MCV: 94.2 fL (ref 78.0–100.0)
MPV: 9.8 fL (ref 8.6–12.4)
Monocytes Absolute: 0.5 10*3/uL (ref 0.1–1.0)
Monocytes Relative: 7 % (ref 3–12)
NEUTROS PCT: 78 % — AB (ref 43–77)
Neutro Abs: 5.9 10*3/uL (ref 1.7–7.7)
PLATELETS: 236 10*3/uL (ref 150–400)
RBC: 4.79 MIL/uL (ref 4.22–5.81)
RDW: 13.7 % (ref 11.5–15.5)
WBC: 7.5 10*3/uL (ref 4.0–10.5)

## 2015-01-04 NOTE — Progress Notes (Signed)
Patient ID: Marvin Carter, male   DOB: 16-Oct-1955, 59 y.o.   MRN: [id number]  Annual  Screening/Preventative Comprehensive Examination  This very nice 59 y.o.male presents for presents for a Wellness/Preventative Visit & comprehensive evaluation and management of multiple medical co-morbidities.  Patient has been followed for HTN, Prediabetes, Hyperlipidemia, Testosterone and Vitamin D Deficiency.   HTN predates since 1999. Patient's BP has been controlled at home.Patoient does have Stage 3 CKDwith GFR 47 ml/min in 2014. Today's BP: 130/76 mmHg. He had a negative Cardiolite and heart cath in 2005, but presented with an ACS in Nov 2012 and underwent PTCA / DES by Dr Denver Faster and patient has done well since and continues in Cardiac rehab.  Patient denies any recent cardiac symptoms as chest pain, palpitations, shortness of breath, dizziness or ankle swelling.   Patient's hyperlipidemia is controlled with diet and medications. Patient denies myalgias or other medication SE's. Last lipids were at goal with  Cholesterol 131; HDL 54; LDL 64; Triglycerides 65 on 09/08/2014.   Patient has prediabetes since 2008  and patient denies reactive hypoglycemic symptoms, visual blurring, diabetic polys or paresthesias. Last A1c was 5.4% on 09/08/2014.   Patient has Low Testosterone and has been on parenteral replacement since 2010 administering his own injections. Patient reports improved sense of well-being on treatment.  Finally, patient has history of Vitamin D Deficiency of 19 in 2008 and last vitamin D was  82 on  09/08/2014.  Medication Sig  . aspirin 81 MG tablet Take 81 mg by mouth daily.  Marland Kitchen atenolol  100 MG tablet Take 1 tablet (100 mg total) by mouth daily.  Marland Kitchen atorvastatin  40 MG tablet Take 1 tablet by mouth  every day for cholesterol  . B complex vitamins tablet Take 1 tablet by mouth daily.  Marland Kitchen BENICAR 40 MG tablet Take 1 tablet by mouth  daily for blood pressure  . buPROPion XL 300 MG 24 hr tablet Take  1 tablet by mouth  daily for mood  . clopidogrel  75 MG tablet Take 1 tablet (75 mg total) by mouth daily.  Marland Kitchen diltiazem  CD 240 MG 24 hr capsule Take 1 capsule by mouth  daily for blood pressure  . doxazosin  8 MG tablet Take 1/2 to 1 tablet daily for BP & Prostate  . fenofibrate 160 MG tablet Take 1 tablet by mouth  daily  . fish oil-omega-3 1000 MG cap Take 1 g by mouth daily. Takes 3 gm daily  . Furosemide  80 MG tablet TAKE 1 TABLET BY MOUTH  DAILY FOR BLOOD PRESSURE  AND FLUID  . NORCO 10-325    . meloxicam  15 MG tablet TAKE 1 TABLET BY MOUTH EVERY DAY FOR PAIN/INFLAMATION  . MULTIVITAMIN WITH MINERALS Take 2 tablets by mouth daily.   . pantoprazole 40 MG tablet Take 1 tablet (40 mg total) by mouth daily.  . ranitidine 300 MG tablet Take 1 to 2 tablets daily to facilitate tapering off PPI's for heartburn/acid indigestion  . DEPO-TESTOSTERONE 200 MG/ML inj USE 2ML INTRAMUSCULARLY EVERY 12 DAYS   Allergies  Allergen Reactions  . Hctz [Hydrochlorothiazide]   . Ibuprofen   . Tape Rash    Clear tape   Past Medical History  Diagnosis Date  . Hypertension   . Hyperlipidemia   . Diverticulitis   . Hemorrhoids   . GERD (gastroesophageal reflux disease)   . Anxiety   . Meniscus tear 01/02/2011  . Right knee pain 01/02/2011  .  Diverticulosis   . Hemarthrosis of knee, right 01/04/2011  . CAD (coronary artery disease), native coronary artery 01/04/2011  . Stented coronary artery 01/04/2011    LAD DES Resolute.  . Hyperlipemia 01/04/2011  . History of meniscectomy of right knee 01/02/2011  . Torn medial meniscus     right knee  . NSVT (nonsustained ventricular tachycardia) ([place]) 01/02/2011  . Atrial flutter, paroxysmal ([place]) 01/04/2011  . Arthritis    Health Maintenance  Topic Date Due  . TETANUS/TDAP  02/20/2015  . INFLUENZA VACCINE  09/20/2015  . COLONOSCOPY  10/25/2020  . Hepatitis C Screening  Completed  . HIV Screening  Completed   Immunization History  Administered  Date(s) Administered  . Influenza Split 12/16/2013, 12/02/2014  . Influenza Whole 12/09/2012  . PPD Test 11/05/2013, 01/04/2015  . Td 02/19/2005   Past Surgical History  Procedure Laterality Date  . Appendectomy    . Knee arthroscopy      x 8 left  . Rotator cuff repair      bilateral  . Hand surgery      right  . Bicept tendon repair      bilateral  . Tonsillectomy    . Knee arthroscopy Right 04/21/2012    Procedure: ARTHROSCOPY KNEE;  Surgeon: Lorn Junes, MD;  Location: [facility];  Service: Orthopedics;  Laterality: Right;  medial and lateral meniscectomies  . Coronary angioplasty with stent placement  2012  . Left heart catheterization with coronary angiogram N/A 01/03/2011    Procedure: LEFT HEART CATHETERIZATION WITH CORONARY ANGIOGRAM;  Surgeon: Lorretta Harp, MD;  Location: [facility] CATH LAB;  Service: Cardiovascular;  Laterality: N/A;  . Percutaneous coronary stent intervention (pci-s) N/A 01/03/2011    Procedure: PERCUTANEOUS CORONARY STENT INTERVENTION (PCI-S);  Surgeon: Lorretta Harp, MD;  Location: [facility] CATH LAB;  Service: Cardiovascular;  Laterality: N/A;   Family History  Problem Relation Age of Onset  . Diabetes Mother   . Heart disease Mother   . Heart attack Mother   . Heart disease Father   . Coronary artery disease Father   . Obesity Brother   . Diabetes Brother   . HIV/AIDS Brother   . Hypertension Brother     Social History   Social History  . Marital Status: Married    Spouse Name: N/A  . Number of Children: 2  . Years of Education: N/A   Occupational History  . Self Employed     Theatre manager  .     Social History Main Topics  . Smoking status: Never Smoker   . Smokeless tobacco: Never Used  . Alcohol Use: 6.0 oz/week    10 Cans of beer per week     Comment: 1-2 beers a night  . Drug Use: No  . Sexual Activity: Not on file   Other Topics Concern  . Not on file   Social History Narrative    ROS Constitutional: Denies fever,  chills, weight loss/gain, headaches, insomnia,  night sweats or change in appetite. Does c/o fatigue. Eyes: Denies redness, blurred vision, diplopia, discharge, itchy or watery eyes.  ENT: Denies discharge, congestion, post nasal drip, epistaxis, sore throat, earache, hearing loss, dental pain, Tinnitus, Vertigo, Sinus pain or snoring.  Cardio: Denies chest pain, palpitations, irregular heartbeat, syncope, dyspnea, diaphoresis, orthopnea, PND, claudication or edema Respiratory: denies cough, dyspnea, DOE, pleurisy, hoarseness, laryngitis or wheezing.  Gastrointestinal: Denies dysphagia, heartburn, reflux, water brash, pain, cramps, nausea, vomiting, bloating, diarrhea, constipation, hematemesis, melena, hematochezia, jaundice or  hemorrhoids Genitourinary: Denies dysuria, frequency, urgency, nocturia, hesitancy, discharge, hematuria or flank pain Musculoskeletal: Denies arthralgia, myalgia, stiffness, Jt. Swelling, pain, limp or strain/sprain. Denies Falls. Skin: Denies puritis, rash, hives, warts, acne, eczema or change in skin lesion Neuro: No weakness, tremor, incoordination, spasms, paresthesia or pain Psychiatric: Denies confusion, memory loss or sensory loss. Denies Depression. Endocrine: Denies change in weight, skin, hair change, nocturia, and paresthesia, diabetic polys, visual blurring or hyper / hypo glycemic episodes.  Heme/Lymph: No excessive bleeding, bruising or enlarged lymph nodes.  Physical Exam  BP 130/76 mmHg  Pulse 64  Temp(Src) 97.9 F (36.6 C)  Resp 16  Ht 5\' 8"  (1.727 m)  Wt 201 lb 3.2 oz (91.264 kg)  BMI 30.60 kg/m2  General Appearance: Well nourished, in no apparent distress. Eyes: PERRLA, EOMs, conjunctiva no swelling or erythema, normal fundi and vessels. Sinuses: No frontal/maxillary tenderness ENT/Mouth: EACs patent / TMs  nl. Nares clear without erythema, swelling, mucoid exudates. Oral hygiene is good. No erythema, swelling, or exudate. Tongue normal,  non-obstructing. Tonsils not swollen or erythematous. Hearing normal.  Neck: Supple, thyroid normal. No bruits, nodes or JVD. Respiratory: Respiratory effort normal.  BS equal and clear bilateral without rales, rhonci, wheezing or stridor. Cardio: Heart sounds are normal with regular rate and rhythm and no murmurs, rubs or gallops. Peripheral pulses are normal and equal bilaterally without edema. No aortic or femoral bruits. Chest: symmetric with normal excursions and percussion.  Abdomen: Flat, soft, with bowl sounds. Nontender, no guarding, rebound, hernias, masses, or organomegaly.  Lymphatics: Non tender without lymphadenopathy.  Genitourinary: No hernias.Testes nl. DRE - prostate nl for age - smooth & firm w/o nodules. Musculoskeletal: Full ROM all peripheral extremities, joint stability, 5/5 strength, and normal gait. Skin: Warm and dry without rashes, lesions, cyanosis, clubbing or  ecchymosis.  Neuro: Cranial nerves intact, reflexes equal bilaterally. Normal muscle tone, no cerebellar symptoms. Sensation intact.  Pysch: Awake and oriented X 3 with normal affect, insight and judgment appropriate.   Assessment and Plan  1. Annual Preventative/Screening Exam   - Microalbumin / creatinine urine ratio - EKG 12-Lead - [place], RETROPERITNL ABD,  LTD - POC Hemoccult Bld/Stl  - Vitamin B12 - PSA - Testosterone - Iron and TIBC - Urinalysis, Routine w reflex microscopic  - CBC with Differential/Platelet - BASIC METABOLIC PANEL WITH GFR - Hepatic function panel - Magnesium - Lipid panel - TSH - Hemoglobin A1c - Insulin, random - VITAMIN D 25 Hydroxy  - PPD  2. Essential hypertension  - Microalbumin / creatinine urine ratio - EKG 12-Lead - [place], RETROPERITNL ABD,  LTD - TSH  3. Hyperlipidemia  - Lipid panel  4. Prediabetes  - Hemoglobin A1c - Insulin, random  5. Vitamin D deficiency  - VITAMIN D 25 Hydroxy   6. Coronary artery disease involving native coronary artery of  native heart without angina pectoris   7. Gastroesophageal reflux disease, esophagitis presence not specified   8. Testosterone deficiency  - Testosterone  9. BMI 30.0-30.9,adult   10. Screening for rectal cancer  - POC Hemoccult Bld/Stl   11. Prostate cancer screening  - PSA  12. Other fatigue  - Vitamin B12 - Testosterone - Iron and TIBC  13. Medication management  - Urinalysis, Routine w reflex microscopic  - CBC with Differential/Platelet - BASIC METABOLIC PANEL WITH GFR - Hepatic function panel - Magnesium  14. Screening examination for pulmonary tuberculosis  - PPD   Continue prudent diet as discussed, weight control, BP monitoring, regular  exercise, and medications as discussed.  Discussed med effects and SE's. Routine screening labs and tests as requested with regular follow-up as recommended.

## 2015-01-04 NOTE — Patient Instructions (Signed)
Recommend Adult Low Dose Aspirin or   coated  Aspirin 81 mg daily   To reduce risk of Colon Cancer 20 %,   Skin Cancer 26 % ,   Melanoma 46%   and   Pancreatic cancer 60%   ++++++++++++++++++++++++++++++++++++++++++++++++++++++  Vitamin D goal   is between 70-100.   Please make sure that you are taking your Vitamin D as directed.   It is very important as a natural anti-inflammatory   helping hair, skin, and nails, as well as reducing stroke and heart attack risk.   It helps your bones and helps with mood.  It also decreases numerous cancer risks so please take it as directed.   Low Vit D is associated with a 200-300% higher risk for CANCER   and 200-300% higher risk for HEART   ATTACK  &  STROKE.   ......................................  It is also associated with higher death rate at younger ages,   autoimmune diseases like Rheumatoid arthritis, Lupus, Multiple Sclerosis.     Also many other serious conditions, like depression, Alzheimer's  Dementia, infertility, muscle aches, fatigue, fibromyalgia - just to name a few.  ++++++++++++++++++++++++++++++++++++++++++++++++  Recommend the book "The END of DIETING" by Dr Joel Fuhrman   & the book "The END of DIABETES " by Dr Joel Fuhrman  At [employer] - get book & Audio CD's     Being diabetic has a  300% increased risk for heart attack, stroke, cancer, and alzheimer- type vascular dementia. It is very important that you work harder with diet by avoiding all foods that are white. Avoid white rice (brown & wild rice is OK), white potatoes (sweetpotatoes in moderation is OK), White bread or wheat bread or anything made out of white flour like bagels, donuts, rolls, buns, biscuits, cakes, pastries, cookies, pizza crust, and pasta (made from white flour & egg whites) - vegetarian pasta or spinach or wheat pasta is OK. Multigrain breads like Arnold's or [employer], or multigrain sandwich thins or flatbreads.  Diet,  exercise and weight loss can reverse and cure diabetes in the early stages.  Diet, exercise and weight loss is very important in the control and prevention of complications of diabetes which affects every system in your body, ie. Brain - dementia/stroke, eyes - glaucoma/blindness, heart - heart attack/heart failure, kidneys - dialysis, stomach - gastric paralysis, intestines - malabsorption, nerves - severe painful neuritis, circulation - gangrene & loss of a leg(s), and finally cancer and Alzheimers.    I recommend avoid fried & greasy foods,  sweets/candy, white rice (brown or wild rice or Quinoa is OK), white potatoes (sweet potatoes are OK) - anything made from white flour - bagels, doughnuts, rolls, buns, biscuits,white and wheat breads, pizza crust and traditional pasta made of white flour & egg white(vegetarian pasta or spinach or wheat pasta is OK).  Multi-grain bread is OK - like multi-grain flat bread or sandwich thins. Avoid alcohol in excess. Exercise is also important.    Eat all the vegetables you want - avoid meat, especially red meat and dairy - especially cheese.  Cheese is the most concentrated form of trans-fats which is the worst thing to clog up our arteries. Veggie cheese is OK which can be found in the fresh produce section at [employer] or [employer] or [employer]  ++++++++++++++++++++++++++++++++++++++++++++++++++ DASH Eating Plan  DASH stands for "Dietary Approaches to Stop Hypertension."   The DASH eating plan is a healthy eating plan that has been shown to reduce high   blood pressure (hypertension). Additional health benefits may include reducing the risk of type 2 diabetes mellitus, heart disease, and stroke. The DASH eating plan may also help with weight loss.  WHAT DO I NEED TO KNOW ABOUT THE DASH EATING PLAN? For the DASH eating plan, you will follow these general guidelines:  Choose foods with a percent daily value for sodium of less than 5% (as listed on the food  label).  Use salt-free seasonings or herbs instead of table salt or sea salt.  Check with your health care provider or pharmacist before using salt substitutes.  Eat lower-sodium products, often labeled as "lower sodium" or "no salt added."  Eat fresh foods.  Eat more vegetables, fruits, and low-fat dairy products.    Choose whole grains. Look for the word "whole" as the first word in the ingredient list.  Choose fish   Limit sweets, desserts, sugars, and sugary drinks.  Choose heart-healthy fats.  Eat veggie cheese   Eat more home-cooked food and less restaurant, buffet, and fast food.  Limit fried foods.  Cook foods using methods other than frying.  Limit canned vegetables. If you do use them, rinse them well to decrease the sodium.  When eating at a restaurant, ask that your food be prepared with less salt, or no salt if possible.                      WHAT FOODS CAN I EAT?  Seek help from a dietitian for individual calorie needs.  Grains Whole grain or whole wheat bread. Brown rice. Whole grain or whole wheat pasta. Quinoa, bulgur, and whole grain cereals. Low-sodium cereals. Corn or whole wheat flour tortillas. Whole grain cornbread. Whole grain crackers. Low-sodium crackers.  Vegetables Fresh or frozen vegetables (raw, steamed, roasted, or grilled). Low-sodium or reduced-sodium tomato and vegetable juices. Low-sodium or reduced-sodium tomato sauce and paste. Low-sodium or reduced-sodium canned vegetables.   Fruits All fresh, canned (in natural juice), or frozen fruits.  Protein Products  All fish and seafood.  Dried beans, peas, or lentils. Unsalted nuts and seeds. Unsalted canned beans.  Dairy Low-fat dairy products, such as skim or 1% milk, 2% or reduced-fat cheeses, low-fat ricotta or cottage cheese, or plain low-fat yogurt. Low-sodium or reduced-sodium cheeses.  Fats and Oils Tub margarines without trans fats. Light or reduced-fat mayonnaise and salad  dressings (reduced sodium). Avocado. Safflower, olive, or canola oils. Natural peanut or almond butter.  Other Unsalted popcorn and pretzels. The items listed above may not be a complete list of recommended foods or beverages. Contact your dietitian for more options.  +++++++++++++++++++++++++++++++++++++++++++  WHAT FOODS ARE NOT RECOMMENDED?  Grains/ White flour or wheat flour  White bread. White pasta. White rice. Refined cornbread. Bagels and croissants. Crackers that contain trans fat.  Vegetables  Creamed or fried vegetables. Vegetables in a . Regular canned vegetables. Regular canned tomato sauce and paste. Regular tomato and vegetable juices.  Fruits Dried fruits. Canned fruit in light or heavy syrup. Fruit juice.  Meat and Other Protein Products Meat in general. Fatty cuts of meat. Ribs, chicken wings, bacon, sausage, bologna, salami, chitterlings, fatback, hot dogs, bratwurst, and packaged luncheon meats.  Dairy Whole or 2% milk, cream, half-and-half, and cream cheese. Whole-fat or sweetened yogurt. Full-fat cheeses or blue cheese. Nondairy creamers and whipped toppings. Processed cheese, cheese spreads, or cheese curds.  Condiments Onion and garlic salt, seasoned salt, table salt, and sea salt. Canned and packaged gravies. Worcestershire sauce. Tartar sauce.   Barbecue sauce. Teriyaki sauce. Soy sauce, including reduced sodium. Steak sauce. Fish sauce. Oyster sauce. Cocktail sauce. Horseradish. Ketchup and mustard. Meat flavorings and tenderizers. Bouillon cubes. Hot sauce. Tabasco sauce. Marinades. Taco seasonings. Relishes.  Fats and Oils Butter, stick margarine, lard, shortening, ghee, and bacon fat. Coconut, palm kernel, or palm oils. Regular salad dressings.  Pickles and olives. Salted popcorn and pretzels. The items listed above may not be a complete list of foods and beverages to avoid.   Preventive Care for Adults  A healthy lifestyle and preventive care can  promote health and wellness. Preventive health guidelines for men include the following key practices:  A routine yearly physical is a good way to check with your health care provider about your health and preventative screening. It is a chance to share any concerns and updates on your health and to receive a thorough exam.  Visit your dentist for a routine exam and preventative care every 6 months. Brush your teeth twice a day and floss once a day. Good oral hygiene prevents tooth decay and gum disease.  The frequency of eye exams is based on your age, health, family medical history, use of contact lenses, and other factors. Follow your health care provider's recommendations for frequency of eye exams.  Eat a healthy diet. Foods such as vegetables, fruits, whole grains, low-fat dairy products, and lean protein foods contain the nutrients you need without too many calories. Decrease your intake of foods high in solid fats, added sugars, and salt. Eat the right amount of calories for you.Get information about a proper diet from your health care provider, if necessary.  Regular physical exercise is one of the most important things you can do for your health. Most adults should get at least 150 minutes of moderate-intensity exercise (any activity that increases your heart rate and causes you to sweat) each week. In addition, most adults need muscle-strengthening exercises on 2 or more days a week.  Maintain a healthy weight. The body mass index (BMI) is a screening tool to identify possible weight problems. It provides an estimate of body fat based on height and weight. Your health care provider can find your BMI and can help you achieve or maintain a healthy weight.For adults 20 years and older:  A BMI below 18.5 is considered underweight.  A BMI of 18.5 to 24.9 is normal.  A BMI of 25 to 29.9 is considered overweight.  A BMI of 30 and above is considered obese.  Maintain normal blood lipids  and cholesterol levels by exercising and minimizing your intake of saturated fat. Eat a balanced diet with plenty of fruit and vegetables. Blood tests for lipids and cholesterol should begin at age 20 and be repeated every 5 years. If your lipid or cholesterol levels are high, you are over 50, or you are at high risk for heart disease, you may need your cholesterol levels checked more frequently.Ongoing high lipid and cholesterol levels should be treated with medicines if diet and exercise are not working.  If you smoke, find out from your health care provider how to quit. If you do not use tobacco, do not start.  Lung cancer screening is recommended for adults aged 55-80 years who are at high risk for developing lung cancer because of a history of smoking. A yearly low-dose CT scan of the lungs is recommended for people who have at least a 30-pack-year history of smoking and are a current smoker or have quit within the past 15   years. A pack year of smoking is smoking an average of 1 pack of cigarettes a day for 1 year (for example: 1 pack a day for 30 years or 2 packs a day for 15 years). Yearly screening should continue until the smoker has stopped smoking for at least 15 years. Yearly screening should be stopped for people who develop a health problem that would prevent them from having lung cancer treatment.  If you choose to drink alcohol, do not have more than 2 drinks per day. One drink is considered to be 12 ounces (355 mL) of beer, 5 ounces (148 mL) of wine, or 1.5 ounces (44 mL) of liquor.  Avoid use of street drugs. Do not share needles with anyone. Ask for help if you need support or instructions about stopping the use of drugs.  High blood pressure causes heart disease and increases the risk of stroke. Your blood pressure should be checked at least every 1-2 years. Ongoing high blood pressure should be treated with medicines, if weight loss and exercise are not effective.  If you are 45-79  years old, ask your health care provider if you should take aspirin to prevent heart disease.  Diabetes screening involves taking a blood sample to check your fasting blood sugar level. This should be done once every 3 years, after age 45, if you are within normal weight and without risk factors for diabetes. Testing should be considered at a younger age or be carried out more frequently if you are overweight and have at least 1 risk factor for diabetes.  Colorectal cancer can be detected and often prevented. Most routine colorectal cancer screening begins at the age of 50 and continues through age 75. However, your health care provider may recommend screening at an earlier age if you have risk factors for colon cancer. On a yearly basis, your health care provider may provide home test kits to check for hidden blood in the stool. Use of a small camera at the end of a tube to directly examine the colon (sigmoidoscopy or colonoscopy) can detect the earliest forms of colorectal cancer. Talk to your health care provider about this at age 50, when routine screening begins. Direct exam of the colon should be repeated every 5-10 years through age 75, unless early forms of precancerous polyps or small growths are found.   Talk with your health care provider about prostate cancer screening.  Testicular cancer screening isrecommended for adult males. Screening includes self-exam, a health care provider exam, and other screening tests. Consult with your health care provider about any symptoms you have or any concerns you have about testicular cancer.  Use sunscreen. Apply sunscreen liberally and repeatedly throughout the day. You should seek shade when your shadow is shorter than you. Protect yourself by wearing long sleeves, pants, a wide-brimmed hat, and sunglasses year round, whenever you are outdoors.  Once a month, do a whole-body skin exam, using a mirror to look at the skin on your back. Tell your health  care provider about new moles, moles that have irregular borders, moles that are larger than a pencil eraser, or moles that have changed in shape or color.  Stay current with required vaccines (immunizations).  Influenza vaccine. All adults should be immunized every year.  Tetanus, diphtheria, and acellular pertussis (Td, Tdap) vaccine. An adult who has not previously received Tdap or who does not know his vaccine status should receive 1 dose of Tdap. This initial dose should be followed by tetanus   and diphtheria toxoids (Td) booster doses every 10 years. Adults with an unknown or incomplete history of completing a 3-dose immunization series with Td-containing vaccines should begin or complete a primary immunization series including a Tdap dose. Adults should receive a Td booster every 10 years.  Varicella vaccine. An adult without evidence of immunity to varicella should receive 2 doses or a second dose if he has previously received 1 dose.  Human papillomavirus (HPV) vaccine. Males aged 13-21 years who have not received the vaccine previously should receive the 3-dose series. Males aged 22-26 years may be immunized. Immunization is recommended through the age of 26 years for any male who has sex with males and did not get any or all doses earlier. Immunization is recommended for any person with an immunocompromised condition through the age of 26 years if he did not get any or all doses earlier. During the 3-dose series, the second dose should be obtained 4-8 weeks after the first dose. The third dose should be obtained 24 weeks after the first dose and 16 weeks after the second dose.  Zoster vaccine. One dose is recommended for adults aged 60 years or older unless certain conditions are present.    PREVNAR  - Pneumococcal 13-valent conjugate (PCV13) vaccine. When indicated, a person who is uncertain of his immunization history and has no record of immunization should receive the PCV13 vaccine. An  adult aged 19 years or older who has certain medical conditions and has not been previously immunized should receive 1 dose of PCV13 vaccine. This PCV13 should be followed with a dose of pneumococcal polysaccharide (PPSV23) vaccine. The PPSV23 vaccine dose should be obtained at least 8 weeks after the dose of PCV13 vaccine. An adult aged 19 years or older who has certain medical conditions and previously received 1 or more doses of PPSV23 vaccine should receive 1 dose of PCV13. The PCV13 vaccine dose should be obtained 1 or more years after the last PPSV23 vaccine dose.    PNEUMOVAX - Pneumococcal polysaccharide (PPSV23) vaccine. When PCV13 is also indicated, PCV13 should be obtained first. All adults aged 65 years and older should be immunized. An adult younger than age 65 years who has certain medical conditions should be immunized. Any person who resides in a nursing home or long-term care facility should be immunized. An adult smoker should be immunized. People with an immunocompromised condition and certain other conditions should receive both PCV13 and PPSV23 vaccines. People with human immunodeficiency virus (HIV) infection should be immunized as soon as possible after diagnosis. Immunization during chemotherapy or radiation therapy should be avoided. Routine use of PPSV23 vaccine is not recommended for American Indians, [place] Natives, or people younger than 65 years unless there are medical conditions that require PPSV23 vaccine. When indicated, people who have unknown immunization and have no record of immunization should receive PPSV23 vaccine. One-time revaccination 5 years after the first dose of PPSV23 is recommended for people aged 19-64 years who have chronic kidney failure, nephrotic syndrome, asplenia, or immunocompromised conditions. People who received 1-2 doses of PPSV23 before age 65 years should receive another dose of PPSV23 vaccine at age 65 years or later if at least 5 years have passed  since the previous dose. Doses of PPSV23 are not needed for people immunized with PPSV23 at or after age 65 years.    Hepatitis A vaccine. Adults who wish to be protected from this disease, have certain high-risk conditions, work with hepatitis A-infected animals, work in hepatitis A   research labs, or travel to or work in countries with a high rate of hepatitis A should be immunized. Adults who were previously unvaccinated and who anticipate close contact with an international adoptee during the first 60 days after arrival in the [place] from a country with a high rate of hepatitis A should be immunized.    Hepatitis B vaccine. Adults should be immunized if they wish to be protected from this disease, have certain high-risk conditions, may be exposed to blood or other infectious body fluids, are household contacts or sex partners of hepatitis B positive people, are clients or workers in certain care facilities, or travel to or work in countries with a high rate of hepatitis B.   Preventive Service / Frequency   Ages 40 to 64  Blood pressure check.  Lipid and cholesterol check  Lung cancer screening. / Every year if you are aged 55-80 years and have a 30-pack-year history of smoking and currently smoke or have quit within the past 15 years. Yearly screening is stopped once you have quit smoking for at least 15 years or develop a health problem that would prevent you from having lung cancer treatment.  Fecal occult blood test (FOBT) of stool. / Every year beginning at age 50 and continuing until age 75. You may not have to do this test if you get a colonoscopy every 10 years.  Flexible sigmoidoscopy** or colonoscopy.** / Every 5 years for a flexible sigmoidoscopy or every 10 years for a colonoscopy beginning at age 50 and continuing until age 75. Screening for abdominal aortic aneurysm (AAA)  by ultrasound is recommended for people who have history of high blood pressure or who are  current or former smokers. 

## 2015-01-05 ENCOUNTER — Encounter (HOSPITAL_COMMUNITY)
Admission: RE | Admit: 2015-01-05 | Discharge: 2015-01-05 | Disposition: A | Discharge status: Home / Self Care | Payer: Self-pay | Source: Ambulatory Visit | Attending: Cardiovascular Disease | Admitting: Cardiovascular Disease

## 2015-01-05 LAB — BASIC METABOLIC PANEL WITH GFR
BUN: 26 mg/dL — ABNORMAL HIGH (ref 7–25)
CHLORIDE: 100 mmol/L (ref 98–110)
CO2: 25 mmol/L (ref 20–31)
Calcium: 10 mg/dL (ref 8.6–10.3)
Creat: 1.34 mg/dL — ABNORMAL HIGH (ref 0.70–1.33)
GFR, EST AFRICAN AMERICAN: 67 mL/min (ref >=60)
GFR, EST NON AFRICAN AMERICAN: 58 mL/min — AB (ref >=60)
Glucose, Bld: 120 mg/dL — ABNORMAL HIGH (ref 65–99)
POTASSIUM: 4.1 mmol/L (ref 3.5–5.3)
SODIUM: 136 mmol/L (ref 135–146)

## 2015-01-05 LAB — HEPATIC FUNCTION PANEL
ALK PHOS: 33 U/L — AB (ref 40–115)
ALT: 33 U/L (ref 9–46)
AST: 40 U/L — AB (ref 10–35)
Albumin: 4.5 g/dL (ref 3.6–5.1)
BILIRUBIN DIRECT: 0.2 mg/dL (ref <=0.2)
BILIRUBIN INDIRECT: 0.7 mg/dL (ref 0.2–1.2)
BILIRUBIN TOTAL: 0.9 mg/dL (ref 0.2–1.2)
Total Protein: 7.2 g/dL (ref 6.1–8.1)

## 2015-01-05 LAB — URINALYSIS, ROUTINE W REFLEX MICROSCOPIC
Bilirubin Urine: NEGATIVE
Glucose, UA: NEGATIVE
HGB URINE DIPSTICK: NEGATIVE
Ketones, ur: NEGATIVE
Leukocytes, UA: NEGATIVE
Nitrite: NEGATIVE
PH: 7 (ref 5.0–8.0)
Protein, ur: NEGATIVE
SPECIFIC GRAVITY, URINE: 1.009 (ref 1.001–1.035)

## 2015-01-05 LAB — IRON AND TIBC
%SAT: 39 % (ref 15–60)
IRON: 137 ug/dL (ref 50–180)
TIBC: 349 ug/dL (ref 250–425)
UIBC: 212 ug/dL (ref 125–400)

## 2015-01-05 LAB — PSA: PSA: 1 ng/mL (ref <=4.00)

## 2015-01-05 LAB — MAGNESIUM: Magnesium: 1.7 mg/dL (ref 1.5–2.5)

## 2015-01-05 LAB — VITAMIN D 25 HYDROXY (VIT D DEFICIENCY, FRACTURES): Vit D, 25-Hydroxy: 67 ng/mL (ref 30–100)

## 2015-01-05 LAB — INSULIN, RANDOM: Insulin: 6.1 u[IU]/mL (ref 2.0–19.6)

## 2015-01-05 LAB — HEMOGLOBIN A1C
Hgb A1c MFr Bld: 5.4 % (ref <5.7)
Mean Plasma Glucose: 108 mg/dL (ref <117)

## 2015-01-05 LAB — TSH: TSH: 0.532 u[IU]/mL (ref 0.350–4.500)

## 2015-01-05 LAB — VITAMIN B12: Vitamin B-12: >2000 pg/mL — ABNORMAL HIGH (ref 211–911)

## 2015-01-05 LAB — LIPID PANEL
CHOL/HDL RATIO: 2.6 ratio (ref <=5.0)
Cholesterol: 164 mg/dL (ref 125–200)
HDL: 64 mg/dL (ref >=40)
LDL CALC: 80 mg/dL (ref <130)
TRIGLYCERIDES: 99 mg/dL (ref <150)
VLDL: 20 mg/dL (ref <30)

## 2015-01-05 LAB — MICROALBUMIN / CREATININE URINE RATIO
Creatinine, Urine: 69 mg/dL (ref 20–370)
Microalb Creat Ratio: 3 mcg/mg creat (ref <30)
Microalb, Ur: 0.2 mg/dL

## 2015-01-05 LAB — TESTOSTERONE: TESTOSTERONE: 590 ng/dL (ref 300–890)

## 2015-01-07 ENCOUNTER — Encounter (HOSPITAL_COMMUNITY): Payer: Self-pay

## 2015-01-10 ENCOUNTER — Encounter (HOSPITAL_COMMUNITY)
Admission: RE | Admit: 2015-01-10 | Discharge: 2015-01-10 | Disposition: A | Discharge status: Home / Self Care | Payer: Self-pay | Source: Ambulatory Visit | Attending: Cardiovascular Disease | Admitting: Cardiovascular Disease

## 2015-01-12 ENCOUNTER — Encounter (HOSPITAL_COMMUNITY): Payer: Self-pay

## 2015-01-17 ENCOUNTER — Encounter (HOSPITAL_COMMUNITY)
Admission: RE | Admit: 2015-01-17 | Discharge: 2015-01-17 | Disposition: A | Discharge status: Home / Self Care | Payer: Self-pay | Source: Ambulatory Visit | Attending: Cardiovascular Disease | Admitting: Cardiovascular Disease

## 2015-01-19 ENCOUNTER — Encounter (HOSPITAL_COMMUNITY)
Admission: RE | Admit: 2015-01-19 | Discharge: 2015-01-19 | Disposition: A | Discharge status: Home / Self Care | Payer: Self-pay | Source: Ambulatory Visit | Attending: Cardiovascular Disease | Admitting: Cardiovascular Disease

## 2015-01-21 ENCOUNTER — Encounter (HOSPITAL_COMMUNITY): Payer: Self-pay

## 2015-01-21 DIAGNOSIS — Z955 Presence of coronary angioplasty implant and graft: Secondary | ICD-10-CM | POA: Insufficient documentation

## 2015-01-24 ENCOUNTER — Encounter (HOSPITAL_COMMUNITY)
Admission: RE | Admit: 2015-01-24 | Discharge: 2015-01-24 | Disposition: A | Discharge status: Home / Self Care | Payer: Self-pay | Source: Ambulatory Visit | Attending: Cardiovascular Disease | Admitting: Cardiovascular Disease

## 2015-01-26 ENCOUNTER — Encounter (HOSPITAL_COMMUNITY): Payer: Self-pay

## 2015-01-27 ENCOUNTER — Other Ambulatory Visit: Payer: Self-pay | Admitting: Internal Medicine

## 2015-01-28 ENCOUNTER — Encounter (HOSPITAL_COMMUNITY)
Admission: RE | Admit: 2015-01-28 | Discharge: 2015-01-28 | Disposition: A | Discharge status: Home / Self Care | Payer: Self-pay | Source: Ambulatory Visit | Attending: Cardiovascular Disease | Admitting: Cardiovascular Disease

## 2015-01-31 ENCOUNTER — Encounter (HOSPITAL_COMMUNITY): Payer: Self-pay

## 2015-02-02 ENCOUNTER — Encounter (HOSPITAL_COMMUNITY): Payer: Self-pay

## 2015-02-04 ENCOUNTER — Encounter (HOSPITAL_COMMUNITY)
Admission: RE | Admit: 2015-02-04 | Discharge: 2015-02-04 | Disposition: A | Discharge status: Home / Self Care | Payer: Self-pay | Source: Ambulatory Visit | Attending: Cardiovascular Disease | Admitting: Cardiovascular Disease

## 2015-02-07 ENCOUNTER — Encounter (HOSPITAL_COMMUNITY)
Admission: RE | Admit: 2015-02-07 | Discharge: 2015-02-07 | Disposition: A | Discharge status: Home / Self Care | Payer: Self-pay | Source: Ambulatory Visit | Attending: Cardiovascular Disease | Admitting: Cardiovascular Disease

## 2015-02-09 ENCOUNTER — Encounter (HOSPITAL_COMMUNITY)
Admission: RE | Admit: 2015-02-09 | Discharge: 2015-02-09 | Disposition: A | Discharge status: Home / Self Care | Payer: Self-pay | Source: Ambulatory Visit | Attending: Cardiovascular Disease | Admitting: Cardiovascular Disease

## 2015-02-11 ENCOUNTER — Encounter (HOSPITAL_COMMUNITY): Payer: Self-pay

## 2015-02-12 ENCOUNTER — Other Ambulatory Visit: Payer: Self-pay | Admitting: Physician Assistant

## 2015-02-16 ENCOUNTER — Encounter (HOSPITAL_COMMUNITY): Payer: Self-pay

## 2015-02-18 ENCOUNTER — Encounter (HOSPITAL_COMMUNITY)
Admission: RE | Admit: 2015-02-18 | Discharge: 2015-02-18 | Disposition: A | Discharge status: Home / Self Care | Payer: Self-pay | Source: Ambulatory Visit | Attending: Cardiovascular Disease | Admitting: Cardiovascular Disease

## 2015-02-19 ENCOUNTER — Other Ambulatory Visit: Payer: Self-pay | Admitting: Internal Medicine

## 2015-02-22 ENCOUNTER — Encounter: Payer: Self-pay | Admitting: Cardiovascular Disease

## 2015-02-22 ENCOUNTER — Ambulatory Visit (INDEPENDENT_AMBULATORY_CARE_PROVIDER_SITE_OTHER): Payer: 59 | Admitting: Cardiovascular Disease

## 2015-02-22 VITALS — BP 118/82 | HR 59 | Ht 68.0 in | Wt 204.0 lb

## 2015-02-22 DIAGNOSIS — E785 Hyperlipidemia, unspecified: Secondary | ICD-10-CM

## 2015-02-22 DIAGNOSIS — I2510 Atherosclerotic heart disease of native coronary artery without angina pectoris: Secondary | ICD-10-CM

## 2015-02-22 DIAGNOSIS — I10 Essential (primary) hypertension: Secondary | ICD-10-CM | POA: Diagnosis not present

## 2015-02-22 NOTE — Progress Notes (Signed)
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 Eddie Dibbles. I saw him 12 months ago. His risk factors include hypertension, hyperlipidemia and family history. His mother died of an MI at age 7 and father had CABG at age 47. He had a stress test in our office September 15, 2007 that showed mild anterolateral ischemia. He is having knee arthroscopy as an outpatient by Dr. Moshe Salisbury who noted 30 seconds of ventricular tachycardia. He was transferred to [facility] and I cathed him radially January 03, 2011 revealing a 70% mid LAD lesion which was found to be physiologically significant by FFR. I stented him with a Resolute drug-eluting stent. He has had no recurrent symptoms. He participates in cardiac rehab, phase 3 still and finds it helpful. His most recent lab work performed 01/04/15 revealed a total cholesterol 164, LDL 80 and HDL of 64.   Current Outpatient Prescriptions  Medication Sig Dispense Refill  . aspirin 81 MG tablet Take 81 mg by mouth daily.    Marland Kitchen atenolol (TENORMIN) 100 MG tablet Take 1 tablet (100 mg total) by mouth daily. 90 tablet prn  . atorvastatin (LIPITOR) 40 MG tablet Take 1 tablet by mouth  every day for cholesterol 90 tablet 4  . b complex vitamins tablet Take 1 tablet by mouth daily.    . B-D 3CC LUER-LOK SYR 21GX1" 21G X 1" 3 ML MISC USE WITH TESTOSTERONE AS DIRECTED 12 each 3  . BENICAR 40 MG tablet Take 1 tablet by mouth  daily for blood pressure (Patient taking differently: Take 1/2  tablet (20 MG) by mouth  daily for blood pressure) 90 tablet 4  . buPROPion (WELLBUTRIN XL) 300 MG 24 hr tablet Take 1 tablet by mouth  daily for mood 90 tablet 4  . clopidogrel (PLAVIX) 75 MG tablet Take 1 tablet (75 mg  total) by mouth daily. 90 tablet prn  . diltiazem (CARDIZEM CD) 240 MG 24 hr capsule Take 1 capsule by mouth  daily for blood pressure 90 capsule 4  . doxazosin (CARDURA) 8 MG tablet TAKE 1/2 TO 1 TABLET DAILY FOR BP & PROSTATE 90 tablet 1  . fenofibrate 160 MG tablet Take 1 tablet by mouth  daily 90 tablet 3  . fish oil-omega-3 fatty acids 1000 MG capsule Take 1 g by mouth daily. Takes 3 gm daily    . furosemide (LASIX) 80 MG tablet TAKE 1 TABLET BY MOUTH  DAILY FOR BLOOD PRESSURE  AND FLUID 90 tablet 1  . HYDROcodone-acetaminophen (NORCO) 10-325 MG per tablet   0  . meloxicam (MOBIC) 15 MG tablet TAKE 1 TABLET BY MOUTH EVERY DAY FOR PAIN/INFLAMATION 90 tablet 4  . Multiple Vitamin (MULTIVITAMIN WITH MINERALS) TABS Take 2 tablets by mouth daily.     . pantoprazole (PROTONIX) 40 MG tablet Take 1 tablet (40 mg total) by mouth daily. 90 tablet 3  . ranitidine (ZANTAC) 300 MG tablet Take 1 to 2 tablets daily to facilitate tapering off PPI's for heartburn/acid indigestion 90 tablet 99  . testosterone cypionate (DEPOTESTOSTERONE CYPIONATE) 200 MG/ML injection USE 2ML INTRAMUSCULARLY EVERY 12 DAYS 10 mL 5   No current facility-administered medications for this visit.    Allergies  Allergen Reactions  . Hctz [Hydrochlorothiazide]   .  Tape Rash    Clear tape    Social History   Social History  . Marital Status: Married    Spouse Name: N/A  . Number of Children: 2  . Years of Education: N/A   Occupational History  . Self Employed     Theatre manager  .     Social History Main Topics  . Smoking status: Never Smoker   . Smokeless tobacco: Never Used  . Alcohol Use: 6.0 oz/week    10 Cans of beer per week     Comment: 1-2 beers a night  . Drug Use: No  . Sexual Activity: Not on file   Other Topics Concern  . Not on file   Social History Narrative     Review of Systems: General: negative for chills, fever, night sweats or weight changes.  Cardiovascular: negative for chest  pain, dyspnea on exertion, edema, orthopnea, palpitations, paroxysmal nocturnal dyspnea or shortness of breath Dermatological: negative for rash Respiratory: negative for cough or wheezing Urologic: negative for hematuria Abdominal: negative for nausea, vomiting, diarrhea, bright red blood per rectum, melena, or hematemesis Neurologic: negative for visual changes, syncope, or dizziness All other systems reviewed and are otherwise negative except as noted above.    Blood pressure 118/82, pulse 59, height 5\' 8"  (1.727 m), weight 204 lb (92.534 kg).  General appearance: alert and no distress Neck: no adenopathy, no carotid bruit, no JVD, supple, symmetrical, trachea midline and thyroid not enlarged, symmetric, no tenderness/mass/nodules Lungs: clear to auscultation bilaterally Heart: regular rate and rhythm, S1, S2 normal, no murmur, click, rub or gallop Extremities: extremities normal, atraumatic, no cyanosis or edema  EKG not performed today  ASSESSMENT AND PLAN:   Hypertension History of hypertension blood pressure measured at 118/82. He is on atenolol, Benicar and diltiazem. Continued current meds at current dosing  Hyperlipidemia History of hyperlipidemia on statin therapy with recent lipid profile performed 01/04/15 revealing total cholesterol 164, LDL of 80 and HDL of 64.  CAD (coronary artery disease), native coronary artery History of CAD status post FFR guided mid LAD stenting using a resolute drug-eluting stent 01/03/11. He has been a symptom manic symptoms.      Lorretta Harp MD FACP,FACC,FAHA, [facility] 02/22/2015 2:07 PM

## 2015-02-22 NOTE — Assessment & Plan Note (Signed)
History of CAD status post FFR guided mid LAD stenting using a resolute drug-eluting stent 01/03/11. He has been a symptom manic symptoms.

## 2015-02-22 NOTE — Patient Instructions (Signed)

## 2015-02-22 NOTE — Assessment & Plan Note (Signed)
History of hyperlipidemia on statin therapy with recent lipid profile performed 01/04/15 revealing total cholesterol 164, LDL of 80 and HDL of 64.

## 2015-02-22 NOTE — Assessment & Plan Note (Signed)
History of hypertension blood pressure measured at 118/82. He is on atenolol, Benicar and diltiazem. Continued current meds at current dosing

## 2015-02-23 ENCOUNTER — Ambulatory Visit: Payer: 59 | Admitting: Cardiovascular Disease

## 2015-02-23 ENCOUNTER — Encounter (HOSPITAL_COMMUNITY)
Admission: RE | Admit: 2015-02-23 | Discharge: 2015-02-23 | Disposition: A | Discharge status: Home / Self Care | Payer: Self-pay | Source: Ambulatory Visit | Attending: Cardiovascular Disease | Admitting: Cardiovascular Disease

## 2015-02-23 DIAGNOSIS — Z955 Presence of coronary angioplasty implant and graft: Secondary | ICD-10-CM | POA: Insufficient documentation

## 2015-02-25 ENCOUNTER — Encounter (HOSPITAL_COMMUNITY)
Admission: RE | Admit: 2015-02-25 | Discharge: 2015-02-25 | Disposition: A | Discharge status: Home / Self Care | Payer: Self-pay | Source: Ambulatory Visit | Attending: Cardiovascular Disease | Admitting: Cardiovascular Disease

## 2015-02-28 ENCOUNTER — Encounter (HOSPITAL_COMMUNITY)
Admission: RE | Admit: 2015-02-28 | Discharge: 2015-02-28 | Disposition: A | Discharge status: Home / Self Care | Payer: Self-pay | Source: Ambulatory Visit | Attending: Cardiovascular Disease | Admitting: Cardiovascular Disease

## 2015-03-01 ENCOUNTER — Encounter: Payer: Self-pay | Admitting: Internal Medicine

## 2015-03-01 ENCOUNTER — Ambulatory Visit (INDEPENDENT_AMBULATORY_CARE_PROVIDER_SITE_OTHER): Payer: 59 | Admitting: Internal Medicine

## 2015-03-01 VITALS — BP 104/62 | HR 64 | Temp 97.7°F | Resp 16 | Ht 68.0 in | Wt 208.4 lb

## 2015-03-01 DIAGNOSIS — Z23 Encounter for immunization: Secondary | ICD-10-CM

## 2015-03-01 DIAGNOSIS — N522 Drug-induced erectile dysfunction: Secondary | ICD-10-CM | POA: Diagnosis not present

## 2015-03-01 DIAGNOSIS — C44209 Unspecified malignant neoplasm of skin of left ear and external auricular canal: Secondary | ICD-10-CM

## 2015-03-01 HISTORY — DX: Drug-induced erectile dysfunction: N52.2

## 2015-03-01 NOTE — Patient Instructions (Addendum)
Actinic Keratosis Actinic keratosis is a precancerous growth on the skin. This means it could develop into skin cancer if it is not treated. About 1% of actinic keratoses turn into skin cancer within a year. It is important to have all such growths removed to prevent them from developing into skin cancer. CAUSES  Actinic keratosis is caused by getting too much ultraviolet (UV) radiation from the sun or other UV light sources. RISK FACTORS Factors that increase your chances of getting actinic keratosis include:  Having light-colored skin and blue eyes.  Having blonde or red hair.  Spending a lot of time in the sun.  Age. The risk of actinic keratosis increases with age. SYMPTOMS  Actinic keratosis growths look like scaly, rough spots of skin. They can be as small as a pinhead or as big as a quarter. They may itch, hurt, or feel sensitive. Sometimes there is a little tag of pink or gray skin growing off them. In some cases, actinic keratoses are easier felt than seen. They do not go away with the use of moisturizing lotions or creams. Actinic keratoses appear most often on areas of skin that get a lot of sun exposure. These areas include the:  Scalp.  Face.  Ears.  Lips.  Upper back.  Backs of the hands.  Forearms. DIAGNOSIS  Your health care provider can usually tell what is wrong by performing a physical exam. A tissue sample (biopsy) may also be taken and examined under a microscope. TREATMENT  Actinic keratosis can be treated several ways. Most treatments can be done in your health care provider's office. Treatment options may include:  Curettage. A tool is used to gently scrape off the growth.  Cryosurgery. Liquid nitrogen is applied to the growth to freeze it. The growth eventually falls off the skin.  Medicated creams, such as 5-fluorouracil or imiquimod. The medicine destroys the cells in the growth.  Chemical peels. Chemicals are applied to the growth and the outer  layers of skin are peeled off.  Photodynamic therapy. A drug that makes your skin more sensitive to light is applied to the skin. A strong, blue light is aimed at the skin and destroys the growth. PREVENTION  To prevent future sun damage:  Try to avoid the sun between 10:00 a.m. and 4:00 p.m. when it is the strongest.  Use a sunscreen or sunblock with SPF 30 or greater.  Apply sunscreen at least 30 minutes before exposure to the sun.  Always wear protective hats, clothing, and sunglasses with UV protection.  Avoid medicines, herbs, and foods that increase your sensitivity to sunlight.  Avoid tanning beds. HOME CARE INSTRUCTIONS   If your skin was covered with a bandage, change and remove the bandage as directed by your health care provider.  Keep the treated area dry as directed by your health care provider.  Apply any creams as prescribed by your health care provider. Follow the directions carefully.  Check your skin regularly for any changes.  Visit a skin doctor (dermatologist) every year for a skin exam. SEEK MEDICAL CARE IF:   Your skin does not heal and becomes irritated, red, or bleeds.  You notice any changes or new growths on your skin.    Recommend Adult Low Dose Aspirin or   coated  Aspirin 81 mg daily   To reduce risk of Colon Cancer 20 %,   Skin Cancer 26 % ,   Melanoma 46%   and   Pancreatic cancer 60%   ++++++++++++++++++++++++++++++++++++++++++++++++++++++  Vitamin D goal   is between 70-100.   Please make sure that you are taking your Vitamin D as directed.   It is very important as a natural anti-inflammatory   helping hair, skin, and nails, as well as reducing stroke and heart attack risk.   It helps your bones and helps with mood.  It also decreases numerous cancer risks so please take it as directed.   Low Vit D is associated with a 200-300% higher risk for CANCER   and 200-300% higher risk for HEART   ATTACK  &  STROKE.     .....................................Marland Kitchen  It is also associated with higher death rate at younger ages,   autoimmune diseases like Rheumatoid arthritis, Lupus, Multiple Sclerosis.     Also many other serious conditions, like depression, Alzheimer's  Dementia, infertility, muscle aches, fatigue, fibromyalgia - just to name a few.  ++++++++++++++++++++++++++++++++++++++++++++++++  Recommend the book "The END of DIETING" by Dr Excell Seltzer   & the book "The END of DIABETES " by Dr Excell Seltzer  At [email or web address] - get book & Audio CD's     Being diabetic has a  300% increased risk for heart attack, stroke, cancer, and alzheimer- type vascular dementia. It is very important that you work harder with diet by avoiding all foods that are white. Avoid white rice (brown & wild rice is OK), white potatoes (sweetpotatoes in moderation is OK), White bread or wheat bread or anything made out of white flour like bagels, donuts, rolls, buns, biscuits, cakes, pastries, cookies, pizza crust, and pasta (made from white flour & egg whites) - vegetarian pasta or spinach or wheat pasta is OK. Multigrain breads like Arnold's or Pepperidge Farm, or multigrain sandwich thins or flatbreads.  Diet, exercise and weight loss can reverse and cure diabetes in the early stages.  Diet, exercise and weight loss is very important in the control and prevention of complications of diabetes which affects every system in your body, ie. Brain - dementia/stroke, eyes - glaucoma/blindness, heart - heart attack/heart failure, kidneys - dialysis, stomach - gastric paralysis, intestines - malabsorption, nerves - severe painful neuritis, circulation - gangrene & loss of a leg(s), and finally cancer and Alzheimers.    I recommend avoid fried & greasy foods,  sweets/candy, white rice (brown or wild rice or Quinoa is OK), white potatoes (sweet potatoes are OK) - anything made from white flour - bagels, doughnuts, rolls, buns, biscuits,white and  wheat breads, pizza crust and traditional pasta made of white flour & egg white(vegetarian pasta or spinach or wheat pasta is OK).  Multi-grain bread is OK - like multi-grain flat bread or sandwich thins. Avoid alcohol in excess. Exercise is also important.    Eat all the vegetables you want - avoid meat, especially red meat and dairy - especially cheese.  Cheese is the most concentrated form of trans-fats which is the worst thing to clog up our arteries. Veggie cheese is OK which can be found in the fresh produce section at [employer] or [employer] or [employer]  ++++++++++++++++++++++++++++++++++++++++++++++++++ DASH Eating Plan  DASH stands for "Dietary Approaches to Stop Hypertension."   The DASH eating plan is a healthy eating plan that has been shown to reduce high blood pressure (hypertension). Additional health benefits may include reducing the risk of type 2 diabetes mellitus, heart disease, and stroke. The DASH eating plan may also help with weight loss.  WHAT DO I NEED TO KNOW ABOUT THE DASH EATING PLAN? For the DASH  eating plan, you will follow these general guidelines:  Choose foods with a percent daily value for sodium of less than 5% (as listed on the food label).  Use salt-free seasonings or herbs instead of table salt or sea salt.  Check with your health care provider or pharmacist before using salt substitutes.  Eat lower-sodium products, often labeled as "lower sodium" or "no salt added."  Eat fresh foods.  Eat more vegetables, fruits, and low-fat dairy products.    Choose whole grains. Look for the word "whole" as the first word in the ingredient list.  Choose fish   Limit sweets, desserts, sugars, and sugary drinks.  Choose heart-healthy fats.  Eat veggie cheese   Eat more home-cooked food and less restaurant, buffet, and fast food.  Limit fried foods.  Cook foods using methods other than frying.  Limit canned vegetables. If you do use them, rinse  them well to decrease the sodium.  When eating at a restaurant, ask that your food be prepared with less salt, or no salt if possible.                      WHAT FOODS CAN I EAT?  Seek help from a dietitian for individual calorie needs.  Grains Whole grain or whole wheat bread. Brown rice. Whole grain or whole wheat pasta. Quinoa, bulgur, and whole grain cereals. Low-sodium cereals. Corn or whole wheat flour tortillas. Whole grain cornbread. Whole grain crackers. Low-sodium crackers.  Vegetables Fresh or frozen vegetables (raw, steamed, roasted, or grilled). Low-sodium or reduced-sodium tomato and vegetable juices. Low-sodium or reduced-sodium tomato sauce and paste. Low-sodium or reduced-sodium canned vegetables.   Fruits All fresh, canned (in natural juice), or frozen fruits.  Protein Products  All fish and seafood.  Dried beans, peas, or lentils. Unsalted nuts and seeds. Unsalted canned beans.  Dairy Low-fat dairy products, such as skim or 1% milk, 2% or reduced-fat cheeses, low-fat ricotta or cottage cheese, or plain low-fat yogurt. Low-sodium or reduced-sodium cheeses.  Fats and Oils Tub margarines without trans fats. Light or reduced-fat mayonnaise and salad dressings (reduced sodium). Avocado. Safflower, olive, or canola oils. Natural peanut or almond butter.  Other Unsalted popcorn and pretzels. The items listed above may not be a complete list of recommended foods or beverages. Contact your dietitian for more options.  +++++++++++++++++++++++++++++++++++++++++++  WHAT FOODS ARE NOT RECOMMENDED?  Grains/ White flour or wheat flour  White bread. White pasta. White rice. Refined cornbread. Bagels and croissants. Crackers that contain trans fat.  Vegetables  Creamed or fried vegetables. Vegetables in a . Regular canned vegetables. Regular canned tomato sauce and paste. Regular tomato and vegetable juices.  Fruits Dried fruits. Canned fruit in light or heavy syrup. Fruit  juice.  Meat and Other Protein Products Meat in general. Fatty cuts of meat. Ribs, chicken wings, bacon, sausage, bologna, salami, chitterlings, fatback, hot dogs, bratwurst, and packaged luncheon meats.  Dairy Whole or 2% milk, cream, half-and-half, and cream cheese. Whole-fat or sweetened yogurt. Full-fat cheeses or blue cheese. Nondairy creamers and whipped toppings. Processed cheese, cheese spreads, or cheese curds.  Condiments Onion and garlic salt, seasoned salt, table salt, and sea salt. Canned and packaged gravies. Worcestershire sauce. Tartar sauce. Barbecue sauce. Teriyaki sauce. Soy sauce, including reduced sodium. Steak sauce. Fish sauce. Oyster sauce. Cocktail sauce. Horseradish. Ketchup and mustard. Meat flavorings and tenderizers. Bouillon cubes. Hot sauce. Tabasco sauce. Marinades. Taco seasonings. Relishes.  Fats and Oils Butter, stick margarine, lard, shortening, ghee,  and bacon fat. Coconut, palm kernel, or palm oils. Regular salad dressings.  Pickles and olives. Salted popcorn and pretzels. The items listed above may not be a complete list of foods and beverages to avoid.

## 2015-03-01 NOTE — Progress Notes (Signed)
Subjective:    Patient ID: Marvin Carter, male    DOB: 08-Dec-1955, 60 y.o.   MRN: [id number]  HPI This nice 60 yo MWM with HTN and ASCAD s/p PTCA & DES presents with concerns re: a non-healing lesion of the helix of the L ear. He also presents to discuss issues w/ED as decreased tumescence. He did try sx's of Cialis and had an expected response even at 1/4 tab 20 mg. He had been cautioned to d/c his Cardura and to avoid concomitant use of nitrates.   Medication Sig  . aspirin 81 MG tablet Take 81 mg by mouth daily.  Marland Kitchen atenolol  100 MG tablet Take 1 tablet (100 mg total) by mouth daily.  Marland Kitchen atorvastatin  40 MG tablet Take 1 tablet by mouth  every day for cholesterol  . b complex vitamins tablet Take 1 tablet by mouth daily.  Marland Kitchen BENICAR 40 MG tablet Take 1/2  tab daily for blood pressure  . WELLBUTRIN XL 300 MG Take 1 tab  daily for mood  . clopidogrel 75 MG tablet Take 1 tab  daily.  Marland Kitchen diltiazem  CD 240 MG 24 hr  Take 1 capsule by mouth  daily for blood pressure  . doxazosin  8 MG tablet TAKE 1/2 TO 1 TABLET DAILY FOR BP & PROSTATE  . fenofibrate 160 MG tablet Take 1 tablet by mouth  daily  . fish oil-omega-3  1000 MG  Take 1 g by mouth daily. Takes 3 gm daily  . furosemide  80 MG tablet TAKE 1 TABLET BY MOUTH  DAILY FOR BLOOD PRESSURE  AND FLUID  . [id number] MG   . meloxicam 15 MG tablet TAKE 1 TABLET BY MOUTH EVERY DAY FOR PAIN/INFLAMATION  . MULTIVITAMIN WITH MIN Take 2 tablets by mouth daily.   . pantoprazole  40 MG tablet Take 1 tablet (40 mg total) by mouth daily.  . ranitidine 300 MG tablet Take 1 to 2 tablets daily to facilitate tapering off PPI's for heartburn/acid indigestion  . testosterone cypionate  200 MG/ML inj USE 2ML INTRAMUSCULARLY EVERY 12 DAYS   Allergies  Allergen Reactions  . Hctz [Hydrochlorothiazide]   . Tape Rash    Clear tape   Past Medical History  Diagnosis Date  . Hypertension   . Hyperlipidemia   . Diverticulitis   . Hemorrhoids   . GERD  (gastroesophageal reflux disease)   . Anxiety   . Meniscus tear 01/02/2011  . Right knee pain 01/02/2011  . Diverticulosis   . Hemarthrosis of knee, right 01/04/2011  . CAD (coronary artery disease), native coronary artery 01/04/2011  . Stented coronary artery 01/04/2011    LAD DES Resolute.  . Hyperlipemia 01/04/2011  . History of meniscectomy of right knee 01/02/2011  . Torn medial meniscus     right knee  . NSVT (nonsustained ventricular tachycardia) ([employer]) 01/02/2011  . Atrial flutter, paroxysmal ([place]) 01/04/2011  . Arthritis    Review of Systems 10 point systems review negative except as above.    Objective:   Physical Exam  BP 104/62 mmHg  Pulse 64  Temp(Src) 97.7 F (36.5 C)  Resp 16  Ht 5\' 8"  (1.727 m)  Wt 208 lb 6.4 oz (94.53 kg)  BMI 31.69 kg/m2  HEENT - Eac's patent. TM's Nl. EOM's full. PERRLA. NasoOroPharynx clear. Neck - supple. Nl Thyroid. Carotids 2+ & No bruits, nodes, JVD Chest - Clear equal BS w/o Rales, rhonchi, wheezes. Cor - Nl HS. RRR w/o sig MGR. PP  1(+). No edema. Abd - No palpable organomegaly, masses or tenderness. BS nl. MS- FROM w/o deformities. Muscle power, tone and bulk Nl. Gait Nl. Neuro - No obvious Cr N abnormalities. Sensory, motor and Cerebellar functions appear Nl w/o focal abnormalities. Psyche - Mental status normal & appropriate.  No delusions, ideations or obvious mood abnormalities. Skin - ~5-8 mm cted area over the L ear helix    Assessment & Plan:   1. Cancer of skin of left ear  - Ambulatory referral to Dermatology  2. Need for prophylactic vaccination with combined diphtheria-tetanus-pertussis (DTP) vaccine  - Tdap vaccine greater than or equal to 7yo IM - Tdap vaccine greater than or equal to 7yo IM  3. ED   - Discussed use of Cialis 5 mg on a prn basis

## 2015-03-02 ENCOUNTER — Other Ambulatory Visit: Payer: Self-pay | Admitting: Internal Medicine

## 2015-03-02 ENCOUNTER — Telehealth: Payer: Self-pay | Admitting: Cardiovascular Disease

## 2015-03-02 ENCOUNTER — Encounter (HOSPITAL_COMMUNITY)
Admission: RE | Admit: 2015-03-02 | Discharge: 2015-03-02 | Disposition: A | Discharge status: Home / Self Care | Payer: Self-pay | Source: Ambulatory Visit | Attending: Cardiovascular Disease | Admitting: Cardiovascular Disease

## 2015-03-02 NOTE — Telephone Encounter (Signed)
Pt called in wanting to know if it is all right for him to take Cialis in low dosages. Please f/u   Thanks

## 2015-03-02 NOTE — Telephone Encounter (Signed)
He is on an alpha blocker Cardura which is a relative contra indication

## 2015-03-02 NOTE — Telephone Encounter (Signed)
Called pt. He states Dr. Melford Aase gave pt samples of Cialis to try, but wanted to get Dr. Kennon Holter [place] first. Wanted to know if any contraindications to him using this medication.  Pt aware I will send to Dr. Gwenlyn Found for review.

## 2015-03-03 NOTE — Telephone Encounter (Signed)
Discussed medication interactions further w Erasmo Downer. Cialis for daily use (such as for BPH) flatly contraindicated if pt on cardura. If for occasional/PRN use, advised OK to try x1. If any symptoms of hypotension pt should avoid further use of Cialis completely. Pt should be advised to closely monitor BP and note any lightheadedness, dizziness, near syncope, etc immediately.  I called pt to communicate this advice. He voiced understanding w recommendations and precautions. Also states he had over past week already tried cialis at 10mg  dose and then at 5mg  dose, feeling the 10mg  dose was too strong. He had called yesterday for "peace of mind" to double check on potential harm of the medication. He denies experiencing side effects or problems on the 5mg  dose. Notes it seems to do the intended job for ED treatment.  Pt advised to call if additional concerns.

## 2015-03-04 ENCOUNTER — Encounter (HOSPITAL_COMMUNITY)
Admission: RE | Admit: 2015-03-04 | Discharge: 2015-03-04 | Disposition: A | Discharge status: Home / Self Care | Payer: Self-pay | Source: Ambulatory Visit | Attending: Cardiovascular Disease | Admitting: Cardiovascular Disease

## 2015-03-07 ENCOUNTER — Encounter (HOSPITAL_COMMUNITY)
Admission: RE | Admit: 2015-03-07 | Discharge: 2015-03-07 | Disposition: A | Discharge status: Home / Self Care | Payer: Self-pay | Source: Ambulatory Visit | Attending: Cardiovascular Disease | Admitting: Cardiovascular Disease

## 2015-03-09 ENCOUNTER — Encounter (HOSPITAL_COMMUNITY)
Admission: RE | Admit: 2015-03-09 | Discharge: 2015-03-09 | Disposition: A | Discharge status: Home / Self Care | Payer: Self-pay | Source: Ambulatory Visit | Attending: Cardiovascular Disease | Admitting: Cardiovascular Disease

## 2015-03-09 ENCOUNTER — Other Ambulatory Visit: Payer: Self-pay | Admitting: Internal Medicine

## 2015-03-09 MED ORDER — FUROSEMIDE 80 MG PO TABS: ORAL_TABLET | ORAL | Status: DC

## 2015-03-11 ENCOUNTER — Encounter (HOSPITAL_COMMUNITY)
Admission: RE | Admit: 2015-03-11 | Discharge: 2015-03-11 | Disposition: A | Discharge status: Home / Self Care | Payer: Self-pay | Source: Ambulatory Visit | Attending: Cardiovascular Disease | Admitting: Cardiovascular Disease

## 2015-03-13 ENCOUNTER — Other Ambulatory Visit: Payer: Self-pay | Admitting: Cardiovascular Disease

## 2015-03-14 ENCOUNTER — Encounter (HOSPITAL_COMMUNITY)
Admission: RE | Admit: 2015-03-14 | Discharge: 2015-03-14 | Disposition: A | Discharge status: Home / Self Care | Payer: Self-pay | Source: Ambulatory Visit | Attending: Cardiovascular Disease | Admitting: Cardiovascular Disease

## 2015-03-14 NOTE — Telephone Encounter (Signed)
REFILL 

## 2015-03-16 ENCOUNTER — Encounter (HOSPITAL_COMMUNITY)
Admission: RE | Admit: 2015-03-16 | Discharge: 2015-03-16 | Disposition: A | Discharge status: Home / Self Care | Payer: Self-pay | Source: Ambulatory Visit | Attending: Cardiovascular Disease | Admitting: Cardiovascular Disease

## 2015-03-18 ENCOUNTER — Encounter (HOSPITAL_COMMUNITY)
Admission: RE | Admit: 2015-03-18 | Discharge: 2015-03-18 | Disposition: A | Discharge status: Home / Self Care | Payer: Self-pay | Source: Ambulatory Visit | Attending: Cardiovascular Disease | Admitting: Cardiovascular Disease

## 2015-03-21 ENCOUNTER — Encounter (HOSPITAL_COMMUNITY): Payer: Self-pay

## 2015-03-23 ENCOUNTER — Encounter (HOSPITAL_COMMUNITY)
Admission: RE | Admit: 2015-03-23 | Discharge: 2015-03-23 | Disposition: A | Discharge status: Home / Self Care | Payer: Self-pay | Source: Ambulatory Visit | Attending: Cardiovascular Disease | Admitting: Cardiovascular Disease

## 2015-03-23 DIAGNOSIS — Z955 Presence of coronary angioplasty implant and graft: Secondary | ICD-10-CM | POA: Insufficient documentation

## 2015-03-25 ENCOUNTER — Encounter (HOSPITAL_COMMUNITY)
Admission: RE | Admit: 2015-03-25 | Discharge: 2015-03-25 | Disposition: A | Discharge status: Home / Self Care | Payer: Self-pay | Source: Ambulatory Visit | Attending: Cardiovascular Disease | Admitting: Cardiovascular Disease

## 2015-03-28 ENCOUNTER — Encounter (HOSPITAL_COMMUNITY): Payer: Self-pay

## 2015-03-30 ENCOUNTER — Encounter (HOSPITAL_COMMUNITY)
Admission: RE | Admit: 2015-03-30 | Discharge: 2015-03-30 | Disposition: A | Discharge status: Home / Self Care | Payer: Self-pay | Source: Ambulatory Visit | Attending: Cardiovascular Disease | Admitting: Cardiovascular Disease

## 2015-04-01 ENCOUNTER — Encounter (HOSPITAL_COMMUNITY)
Admission: RE | Admit: 2015-04-01 | Discharge: 2015-04-01 | Disposition: A | Discharge status: Home / Self Care | Payer: Self-pay | Source: Ambulatory Visit | Attending: Cardiovascular Disease | Admitting: Cardiovascular Disease

## 2015-04-04 ENCOUNTER — Encounter (HOSPITAL_COMMUNITY): Payer: Self-pay

## 2015-04-06 ENCOUNTER — Encounter (HOSPITAL_COMMUNITY)
Admission: RE | Admit: 2015-04-06 | Discharge: 2015-04-06 | Disposition: A | Discharge status: Home / Self Care | Payer: Self-pay | Source: Ambulatory Visit | Attending: Cardiovascular Disease | Admitting: Cardiovascular Disease

## 2015-04-08 ENCOUNTER — Other Ambulatory Visit: Payer: Self-pay | Admitting: Internal Medicine

## 2015-04-08 ENCOUNTER — Encounter (HOSPITAL_COMMUNITY)
Admission: RE | Admit: 2015-04-08 | Discharge: 2015-04-08 | Disposition: A | Discharge status: Home / Self Care | Payer: Self-pay | Source: Ambulatory Visit | Attending: Cardiovascular Disease | Admitting: Cardiovascular Disease

## 2015-04-08 NOTE — Telephone Encounter (Signed)
Rx called into [facility] pharmacy. 

## 2015-04-11 ENCOUNTER — Encounter (HOSPITAL_COMMUNITY)
Admission: RE | Admit: 2015-04-11 | Discharge: 2015-04-11 | Disposition: A | Discharge status: Home / Self Care | Payer: Self-pay | Source: Ambulatory Visit | Attending: Cardiovascular Disease | Admitting: Cardiovascular Disease

## 2015-04-12 ENCOUNTER — Ambulatory Visit (INDEPENDENT_AMBULATORY_CARE_PROVIDER_SITE_OTHER): Payer: 59 | Admitting: Internal Medicine

## 2015-04-12 ENCOUNTER — Encounter: Payer: Self-pay | Admitting: Internal Medicine

## 2015-04-12 VITALS — BP 118/66 | HR 58 | Temp 98.6°F | Resp 18 | Ht 68.0 in | Wt 204.0 lb

## 2015-04-12 DIAGNOSIS — I10 Essential (primary) hypertension: Secondary | ICD-10-CM | POA: Diagnosis not present

## 2015-04-12 DIAGNOSIS — E785 Hyperlipidemia, unspecified: Secondary | ICD-10-CM

## 2015-04-12 DIAGNOSIS — E559 Vitamin D deficiency, unspecified: Secondary | ICD-10-CM

## 2015-04-12 DIAGNOSIS — Z79899 Other long term (current) drug therapy: Secondary | ICD-10-CM | POA: Diagnosis not present

## 2015-04-12 DIAGNOSIS — R7303 Prediabetes: Secondary | ICD-10-CM | POA: Diagnosis not present

## 2015-04-12 NOTE — Progress Notes (Signed)
Patient ID: Jacorion Suma, male   DOB: 11-08-1955, 60 y.o.   MRN: [id number]  Assessment and Plan:  Hypertension:  -may need to cut back further on BP meds so that he can take doxazosin if needed for BPH -continue to monitor at home especially if taking cialis -Continue medication,  -monitor blood pressure at home.  -Continue DASH diet.   -Reminder to go to the ER if any CP, SOB, nausea, dizziness, severe HA, changes vision/speech, left arm numbness and tingling, and jaw pain.  Cholesterol: -Continue diet and exercise.  -Check cholesterol.   Pre-diabetes: -Continue diet and exercise.  -Check A1C  Vitamin D Def: -check level -continue medications.   ED -cont cialis prn -no samples given today  Recommended continued cardiac rehab and also recommended healthy diet.  Patient  Continue diet and meds as discussed. Further disposition pending results of labs.  HPI 60 y.o. male  presents for 3 month follow up with hypertension, hyperlipidemia, prediabetes and vitamin D.   His blood pressure has been controlled at home, today their BP is BP: 118/66 mmHg.   He does workout. He denies chest pain, shortness of breath, dizziness.   He is on cholesterol medication and denies myalgias. His cholesterol is at goal. The cholesterol last visit was:   Lab Results  Component Value Date   CHOL 164 01/04/2015   HDL 64 01/04/2015   LDLCALC 80 01/04/2015   TRIG 99 01/04/2015   CHOLHDL 2.6 01/04/2015     He has been working on diet and exercise for prediabetes, and denies foot ulcerations, hyperglycemia, hypoglycemia , increased appetite, nausea, paresthesia of the feet, polydipsia, polyuria, visual disturbances, vomiting and weight loss. Last A1C in the office was:  Lab Results  Component Value Date   HGBA1C 5.4 01/04/2015    Patient is on Vitamin D supplement.  Lab Results  Component Value Date   VD25OH 10 01/04/2015     He reports that he has been using cialis that he bough online  and that has been helping.  He usually uses a 1/3 of a tablet.  He reports that he is cutting his doxazosin in 1/4 and he stops the medication if he is using the cialis.    He is going to see derm for a skin check.   Current Medications:  Current Outpatient Prescriptions on File Prior to Visit  Medication Sig Dispense Refill  . aspirin 81 MG tablet Take 81 mg by mouth daily.    Marland Kitchen atenolol (TENORMIN) 100 MG tablet Take 1 tablet (100 mg total) by mouth daily. 90 tablet prn  . atorvastatin (LIPITOR) 40 MG tablet Take 1 tablet by mouth  every day for cholesterol 90 tablet 4  . b complex vitamins tablet Take 1 tablet by mouth daily.    . B-D 3CC LUER-LOK SYR 21GX1" 21G X 1" 3 ML MISC USE WITH TESTOSTERONE AS DIRECTED 12 each 3  . BENICAR 40 MG tablet Take 1 tablet by mouth  daily for blood pressure (Patient taking differently: Take 1/2  tablet (20 MG) by mouth  daily for blood pressure) 90 tablet 4  . buPROPion (WELLBUTRIN XL) 300 MG 24 hr tablet Take 1 tablet by mouth  daily for mood 90 tablet 4  . clopidogrel (PLAVIX) 75 MG tablet Take 1 tablet by mouth  daily 90 tablet 2  . diltiazem (CARDIZEM CD) 240 MG 24 hr capsule Take 1 capsule by mouth  daily for blood pressure 90 capsule 4  . doxazosin (CARDURA)  8 MG tablet TAKE 1/2 TO 1 TABLET DAILY FOR BP & PROSTATE 90 tablet 1  . fenofibrate 160 MG tablet Take 1 tablet by mouth  daily 90 tablet 3  . fish oil-omega-3 fatty acids 1000 MG capsule Take 1 g by mouth daily. Takes 3 gm daily    . furosemide (LASIX) 80 MG tablet TAKE 1 TABLET BY MOUTH  DAILY FOR BLOOD PRESSURE  AND FLUID 90 tablet 1  . HYDROcodone-acetaminophen (NORCO) 10-325 MG per tablet   0  . meloxicam (MOBIC) 15 MG tablet TAKE 1 TABLET BY MOUTH EVERY DAY FOR PAIN/INFLAMATION 90 tablet 4  . Multiple Vitamin (MULTIVITAMIN WITH MINERALS) TABS Take 2 tablets by mouth daily.     . pantoprazole (PROTONIX) 40 MG tablet Take 1 tablet by mouth  daily 90 tablet 3  . ranitidine (ZANTAC) 300 MG  tablet Take 1 to 2 tablets daily to facilitate tapering off PPI's for heartburn/acid indigestion 90 tablet 99  . testosterone cypionate (DEPOTESTOSTERONE CYPIONATE) 200 MG/ML injection INJECT 2 ML INTRAMUSCUARLY EVERY 12 DAYS 10 mL 1   No current facility-administered medications on file prior to visit.    Medical History:  Past Medical History  Diagnosis Date  . Hypertension   . Hyperlipidemia   . Diverticulitis   . Hemorrhoids   . GERD (gastroesophageal reflux disease)   . Anxiety   . Meniscus tear 01/02/2011  . Right knee pain 01/02/2011  . Diverticulosis   . Hemarthrosis of knee, right 01/04/2011  . CAD (coronary artery disease), native coronary artery 01/04/2011  . Stented coronary artery 01/04/2011    LAD DES Resolute.  . Hyperlipemia 01/04/2011  . History of meniscectomy of right knee 01/02/2011  . Torn medial meniscus     right knee  . NSVT (nonsustained ventricular tachycardia) ([place]) 01/02/2011  . Atrial flutter, paroxysmal ([place]) 01/04/2011  . Arthritis     Allergies:  Allergies  Allergen Reactions  . Hctz [Hydrochlorothiazide]   . Tape Rash    Clear tape     Review of Systems:  Review of Systems  Constitutional: Negative for fever, chills and malaise/fatigue.  HENT: Negative for congestion, ear pain and sore throat.   Respiratory: Negative for cough, shortness of breath and wheezing.   Cardiovascular: Negative for chest pain, palpitations and leg swelling.  Gastrointestinal: Negative for heartburn, vomiting, diarrhea, constipation, blood in stool and melena.  Genitourinary: Negative.   Skin: Negative.   Neurological: Negative for dizziness, sensory change, loss of consciousness and headaches.  Psychiatric/Behavioral: Negative for depression. The patient is not nervous/anxious and does not have insomnia.     Family history- Review and unchanged  Social history- Review and unchanged  Physical Exam: BP 118/66 mmHg  Pulse 58  Temp(Src) 98.6 F (37 C)  (Temporal)  Resp 18  Ht 5\' 8"  (1.727 m)  Wt 204 lb (92.534 kg)  BMI 31.03 kg/m2 Wt Readings from Last 3 Encounters:  04/12/15 204 lb (92.534 kg)  03/01/15 208 lb 6.4 oz (94.53 kg)  02/22/15 204 lb (92.534 kg)    General Appearance: Well nourished well developed, in no apparent distress. Eyes: PERRLA, EOMs, conjunctiva no swelling or erythema ENT/Mouth: Ear canals normal without obstruction, swelling, erythma, discharge.  TMs normal bilaterally.  Oropharynx moist, clear, without exudate, or postoropharyngeal swelling. Neck: Supple, thyroid normal,no cervical adenopathy  Respiratory: Respiratory effort normal, Breath sounds clear A&P without rhonchi, wheeze, or rale.  No retractions, no accessory usage. Cardio: RRR with no MRGs. Brisk peripheral pulses without edema.  Abdomen: Soft, + BS,  Non tender, no guarding, rebound, hernias, masses. Musculoskeletal: Full ROM, 5/5 strength, Normal gait Skin: Warm, dry without rashes, lesions, ecchymosis.  Neuro: Awake and oriented X 3, Cranial nerves intact. Normal muscle tone, no cerebellar symptoms. Psych: Normal affect, Insight and Judgment appropriate.    Starlyn Skeans, PA-C 11:06 AM [facility]

## 2015-04-13 ENCOUNTER — Encounter (HOSPITAL_COMMUNITY)
Admission: RE | Admit: 2015-04-13 | Discharge: 2015-04-13 | Disposition: A | Discharge status: Home / Self Care | Payer: Self-pay | Source: Ambulatory Visit | Attending: Cardiovascular Disease | Admitting: Cardiovascular Disease

## 2015-04-15 ENCOUNTER — Encounter (HOSPITAL_COMMUNITY): Payer: Self-pay

## 2015-04-18 ENCOUNTER — Encounter (HOSPITAL_COMMUNITY): Payer: Self-pay

## 2015-04-20 ENCOUNTER — Encounter (HOSPITAL_COMMUNITY)
Admission: RE | Admit: 2015-04-20 | Discharge: 2015-04-20 | Disposition: A | Discharge status: Home / Self Care | Payer: Self-pay | Source: Ambulatory Visit | Attending: Cardiovascular Disease | Admitting: Cardiovascular Disease

## 2015-04-20 DIAGNOSIS — Z955 Presence of coronary angioplasty implant and graft: Secondary | ICD-10-CM | POA: Insufficient documentation

## 2015-04-22 ENCOUNTER — Encounter (HOSPITAL_COMMUNITY): Payer: Self-pay

## 2015-04-25 ENCOUNTER — Encounter (HOSPITAL_COMMUNITY)
Admission: RE | Admit: 2015-04-25 | Discharge: 2015-04-25 | Disposition: A | Discharge status: Home / Self Care | Payer: Self-pay | Source: Ambulatory Visit | Attending: Cardiovascular Disease | Admitting: Cardiovascular Disease

## 2015-04-27 ENCOUNTER — Encounter (HOSPITAL_COMMUNITY)
Admission: RE | Admit: 2015-04-27 | Discharge: 2015-04-27 | Disposition: A | Discharge status: Home / Self Care | Payer: Self-pay | Source: Ambulatory Visit | Attending: Cardiovascular Disease | Admitting: Cardiovascular Disease

## 2015-04-29 ENCOUNTER — Encounter (HOSPITAL_COMMUNITY)
Admission: RE | Admit: 2015-04-29 | Discharge: 2015-04-29 | Disposition: A | Discharge status: Home / Self Care | Payer: Self-pay | Source: Ambulatory Visit | Attending: Cardiovascular Disease | Admitting: Cardiovascular Disease

## 2015-05-02 ENCOUNTER — Encounter (HOSPITAL_COMMUNITY)
Admission: RE | Admit: 2015-05-02 | Discharge: 2015-05-02 | Disposition: A | Discharge status: Home / Self Care | Payer: Self-pay | Source: Ambulatory Visit | Attending: Cardiovascular Disease | Admitting: Cardiovascular Disease

## 2015-05-04 ENCOUNTER — Encounter (HOSPITAL_COMMUNITY): Payer: Self-pay

## 2015-05-06 ENCOUNTER — Encounter (HOSPITAL_COMMUNITY)
Admission: RE | Admit: 2015-05-06 | Discharge: 2015-05-06 | Disposition: A | Discharge status: Home / Self Care | Payer: Self-pay | Source: Ambulatory Visit | Attending: Cardiovascular Disease | Admitting: Cardiovascular Disease

## 2015-05-09 ENCOUNTER — Encounter (HOSPITAL_COMMUNITY)
Admission: RE | Admit: 2015-05-09 | Discharge: 2015-05-09 | Disposition: A | Discharge status: Home / Self Care | Payer: Self-pay | Source: Ambulatory Visit | Attending: Cardiovascular Disease | Admitting: Cardiovascular Disease

## 2015-05-11 ENCOUNTER — Encounter (HOSPITAL_COMMUNITY)
Admission: RE | Admit: 2015-05-11 | Discharge: 2015-05-11 | Disposition: A | Discharge status: Home / Self Care | Payer: Self-pay | Source: Ambulatory Visit | Attending: Cardiovascular Disease | Admitting: Cardiovascular Disease

## 2015-05-13 ENCOUNTER — Encounter (HOSPITAL_COMMUNITY): Payer: Self-pay

## 2015-05-16 ENCOUNTER — Other Ambulatory Visit: Payer: Self-pay | Admitting: Internal Medicine

## 2015-05-16 ENCOUNTER — Encounter (HOSPITAL_COMMUNITY)
Admission: RE | Admit: 2015-05-16 | Discharge: 2015-05-16 | Disposition: A | Discharge status: Home / Self Care | Payer: Self-pay | Source: Ambulatory Visit | Attending: Cardiovascular Disease | Admitting: Cardiovascular Disease

## 2015-05-18 ENCOUNTER — Encounter (HOSPITAL_COMMUNITY)
Admission: RE | Admit: 2015-05-18 | Discharge: 2015-05-18 | Disposition: A | Discharge status: Home / Self Care | Payer: Self-pay | Source: Ambulatory Visit | Attending: Cardiovascular Disease | Admitting: Cardiovascular Disease

## 2015-05-20 ENCOUNTER — Encounter (HOSPITAL_COMMUNITY): Payer: Self-pay

## 2015-05-23 ENCOUNTER — Encounter (HOSPITAL_COMMUNITY): Payer: Self-pay

## 2015-05-23 DIAGNOSIS — Z955 Presence of coronary angioplasty implant and graft: Secondary | ICD-10-CM | POA: Insufficient documentation

## 2015-05-25 ENCOUNTER — Encounter (HOSPITAL_COMMUNITY)
Admission: RE | Admit: 2015-05-25 | Discharge: 2015-05-25 | Disposition: A | Discharge status: Home / Self Care | Payer: Self-pay | Source: Ambulatory Visit | Attending: Cardiovascular Disease | Admitting: Cardiovascular Disease

## 2015-05-27 ENCOUNTER — Encounter (HOSPITAL_COMMUNITY)
Admission: RE | Admit: 2015-05-27 | Discharge: 2015-05-27 | Disposition: A | Discharge status: Home / Self Care | Payer: Self-pay | Source: Ambulatory Visit | Attending: Cardiovascular Disease | Admitting: Cardiovascular Disease

## 2015-05-30 ENCOUNTER — Encounter (HOSPITAL_COMMUNITY)
Admission: RE | Admit: 2015-05-30 | Discharge: 2015-05-30 | Disposition: A | Discharge status: Home / Self Care | Payer: Self-pay | Source: Ambulatory Visit | Attending: Cardiovascular Disease | Admitting: Cardiovascular Disease

## 2015-06-01 ENCOUNTER — Encounter (HOSPITAL_COMMUNITY)
Admission: RE | Admit: 2015-06-01 | Discharge: 2015-06-01 | Disposition: A | Discharge status: Home / Self Care | Payer: Self-pay | Source: Ambulatory Visit | Attending: Cardiovascular Disease | Admitting: Cardiovascular Disease

## 2015-06-02 ENCOUNTER — Other Ambulatory Visit: Payer: Self-pay | Admitting: Internal Medicine

## 2015-06-03 ENCOUNTER — Encounter (HOSPITAL_COMMUNITY)
Admission: RE | Admit: 2015-06-03 | Discharge: 2015-06-03 | Disposition: A | Discharge status: Home / Self Care | Payer: Self-pay | Source: Ambulatory Visit | Attending: Cardiovascular Disease | Admitting: Cardiovascular Disease

## 2015-06-06 ENCOUNTER — Encounter (HOSPITAL_COMMUNITY)
Admission: RE | Admit: 2015-06-06 | Discharge: 2015-06-06 | Disposition: A | Discharge status: Home / Self Care | Payer: Self-pay | Source: Ambulatory Visit | Attending: Cardiovascular Disease | Admitting: Cardiovascular Disease

## 2015-06-08 ENCOUNTER — Encounter (HOSPITAL_COMMUNITY): Payer: Self-pay

## 2015-06-10 ENCOUNTER — Encounter (HOSPITAL_COMMUNITY)
Admission: RE | Admit: 2015-06-10 | Discharge: 2015-06-10 | Disposition: A | Discharge status: Home / Self Care | Payer: Self-pay | Source: Ambulatory Visit | Attending: Cardiovascular Disease | Admitting: Cardiovascular Disease

## 2015-06-13 ENCOUNTER — Encounter (HOSPITAL_COMMUNITY)
Admission: RE | Admit: 2015-06-13 | Discharge: 2015-06-13 | Disposition: A | Discharge status: Home / Self Care | Payer: Self-pay | Source: Ambulatory Visit | Attending: Cardiovascular Disease | Admitting: Cardiovascular Disease

## 2015-06-15 ENCOUNTER — Encounter (HOSPITAL_COMMUNITY): Payer: Self-pay

## 2015-06-17 ENCOUNTER — Encounter (HOSPITAL_COMMUNITY): Payer: Self-pay

## 2015-06-20 ENCOUNTER — Encounter (HOSPITAL_COMMUNITY)
Admission: RE | Admit: 2015-06-20 | Discharge: 2015-06-20 | Disposition: A | Discharge status: Home / Self Care | Payer: Self-pay | Source: Ambulatory Visit | Attending: Cardiovascular Disease | Admitting: Cardiovascular Disease

## 2015-06-20 DIAGNOSIS — Z955 Presence of coronary angioplasty implant and graft: Secondary | ICD-10-CM | POA: Insufficient documentation

## 2015-06-22 ENCOUNTER — Encounter (HOSPITAL_COMMUNITY)
Admission: RE | Admit: 2015-06-22 | Discharge: 2015-06-22 | Disposition: A | Discharge status: Home / Self Care | Payer: Self-pay | Source: Ambulatory Visit | Attending: Cardiovascular Disease | Admitting: Cardiovascular Disease

## 2015-06-24 ENCOUNTER — Encounter (HOSPITAL_COMMUNITY): Payer: Self-pay

## 2015-06-27 ENCOUNTER — Encounter (HOSPITAL_COMMUNITY)
Admission: RE | Admit: 2015-06-27 | Discharge: 2015-06-27 | Disposition: A | Discharge status: Home / Self Care | Payer: Self-pay | Source: Ambulatory Visit | Attending: Cardiovascular Disease | Admitting: Cardiovascular Disease

## 2015-06-29 ENCOUNTER — Encounter (HOSPITAL_COMMUNITY): Payer: Self-pay

## 2015-07-01 ENCOUNTER — Encounter (HOSPITAL_COMMUNITY): Payer: Self-pay

## 2015-07-04 ENCOUNTER — Encounter (HOSPITAL_COMMUNITY)
Admission: RE | Admit: 2015-07-04 | Discharge: 2015-07-04 | Disposition: A | Discharge status: Home / Self Care | Payer: Self-pay | Source: Ambulatory Visit | Attending: Cardiovascular Disease | Admitting: Cardiovascular Disease

## 2015-07-05 ENCOUNTER — Telehealth: Payer: Self-pay | Admitting: *Deleted

## 2015-07-05 NOTE — Telephone Encounter (Signed)
Fax sent to [facility] to continue cardiac rehab for patient to be able to continue participating in the program.

## 2015-07-06 ENCOUNTER — Encounter (HOSPITAL_COMMUNITY)
Admission: RE | Admit: 2015-07-06 | Discharge: 2015-07-06 | Disposition: A | Discharge status: Home / Self Care | Payer: Self-pay | Source: Ambulatory Visit | Attending: Cardiovascular Disease | Admitting: Cardiovascular Disease

## 2015-07-08 ENCOUNTER — Encounter (HOSPITAL_COMMUNITY)
Admission: RE | Admit: 2015-07-08 | Discharge: 2015-07-08 | Disposition: A | Discharge status: Home / Self Care | Payer: Self-pay | Source: Ambulatory Visit | Attending: Cardiovascular Disease | Admitting: Cardiovascular Disease

## 2015-07-11 ENCOUNTER — Ambulatory Visit (INDEPENDENT_AMBULATORY_CARE_PROVIDER_SITE_OTHER): Payer: 59 | Admitting: Internal Medicine

## 2015-07-11 ENCOUNTER — Other Ambulatory Visit: Payer: Self-pay

## 2015-07-11 ENCOUNTER — Encounter: Payer: Self-pay | Admitting: Internal Medicine

## 2015-07-11 ENCOUNTER — Encounter (HOSPITAL_COMMUNITY)
Admission: RE | Admit: 2015-07-11 | Discharge: 2015-07-11 | Disposition: A | Discharge status: Home / Self Care | Payer: Self-pay | Source: Ambulatory Visit | Attending: Cardiovascular Disease | Admitting: Cardiovascular Disease

## 2015-07-11 VITALS — BP 120/70 | HR 64 | Temp 97.6°F | Resp 16 | Ht 68.0 in | Wt 198.8 lb

## 2015-07-11 DIAGNOSIS — E291 Testicular hypofunction: Secondary | ICD-10-CM | POA: Diagnosis not present

## 2015-07-11 DIAGNOSIS — I10 Essential (primary) hypertension: Secondary | ICD-10-CM | POA: Diagnosis not present

## 2015-07-11 DIAGNOSIS — M170 Bilateral primary osteoarthritis of knee: Secondary | ICD-10-CM

## 2015-07-11 DIAGNOSIS — Z79899 Other long term (current) drug therapy: Secondary | ICD-10-CM

## 2015-07-11 DIAGNOSIS — R7303 Prediabetes: Secondary | ICD-10-CM

## 2015-07-11 DIAGNOSIS — E559 Vitamin D deficiency, unspecified: Secondary | ICD-10-CM | POA: Diagnosis not present

## 2015-07-11 DIAGNOSIS — E785 Hyperlipidemia, unspecified: Secondary | ICD-10-CM

## 2015-07-11 DIAGNOSIS — E349 Endocrine disorder, unspecified: Secondary | ICD-10-CM

## 2015-07-11 LAB — BASIC METABOLIC PANEL WITH GFR
BUN: 22 mg/dL (ref 7–25)
CHLORIDE: 101 mmol/L (ref 98–110)
CO2: 25 mmol/L (ref 20–31)
Calcium: 9.5 mg/dL (ref 8.6–10.3)
Creat: 1.37 mg/dL — ABNORMAL HIGH (ref 0.70–1.25)
GFR, Est African American: 64 mL/min (ref >=60)
GFR, Est Non African American: 56 mL/min — ABNORMAL LOW (ref >=60)
GLUCOSE: 78 mg/dL (ref 65–99)
POTASSIUM: 3.9 mmol/L (ref 3.5–5.3)
Sodium: 138 mmol/L (ref 135–146)

## 2015-07-11 LAB — HEPATIC FUNCTION PANEL
ALBUMIN: 4.6 g/dL (ref 3.6–5.1)
ALK PHOS: 33 U/L — AB (ref 40–115)
ALT: 32 U/L (ref 9–46)
AST: 45 U/L — AB (ref 10–35)
Bilirubin, Direct: 0.2 mg/dL (ref <=0.2)
Indirect Bilirubin: 0.7 mg/dL (ref 0.2–1.2)
TOTAL PROTEIN: 7.1 g/dL (ref 6.1–8.1)
Total Bilirubin: 0.9 mg/dL (ref 0.2–1.2)

## 2015-07-11 LAB — CBC WITH DIFFERENTIAL/PLATELET
BASOS PCT: 0 %
Basophils Absolute: 0 cells/uL (ref 0–200)
EOS ABS: 120 {cells}/uL (ref 15–500)
EOS PCT: 2 %
HCT: 43.1 % (ref 38.5–50.0)
HEMOGLOBIN: 14.8 g/dL (ref 13.2–17.1)
LYMPHS ABS: 1500 {cells}/uL (ref 850–3900)
Lymphocytes Relative: 25 %
MCH: 33 pg (ref 27.0–33.0)
MCHC: 34.3 g/dL (ref 32.0–36.0)
MCV: 96 fL (ref 80.0–100.0)
MONOS PCT: 14 %
MPV: 10.6 fL (ref 7.5–12.5)
Monocytes Absolute: 840 cells/uL (ref 200–950)
NEUTROS ABS: 3540 {cells}/uL (ref 1500–7800)
Neutrophils Relative %: 59 %
PLATELETS: 191 10*3/uL (ref 140–400)
RBC: 4.49 MIL/uL (ref 4.20–5.80)
RDW: 13 % (ref 11.0–15.0)
WBC: 6 10*3/uL (ref 3.8–10.8)

## 2015-07-11 LAB — MAGNESIUM: Magnesium: 1.6 mg/dL (ref 1.5–2.5)

## 2015-07-11 LAB — LIPID PANEL
Cholesterol: 153 mg/dL (ref 125–200)
HDL: 56 mg/dL (ref >=40)
LDL CALC: 77 mg/dL (ref <130)
Total CHOL/HDL Ratio: 2.7 Ratio (ref <=5.0)
Triglycerides: 99 mg/dL (ref <150)
VLDL: 20 mg/dL (ref <30)

## 2015-07-11 LAB — TSH: TSH: 1.12 m[IU]/L (ref 0.40–4.50)

## 2015-07-11 MED ORDER — MELOXICAM 15 MG PO TABS: ORAL_TABLET | ORAL | Status: AC

## 2015-07-11 MED ORDER — MELOXICAM 15 MG PO TABS: ORAL_TABLET | ORAL | Status: DC

## 2015-07-11 NOTE — Patient Instructions (Signed)

## 2015-07-11 NOTE — Progress Notes (Signed)
Patient ID: Marvin Carter, male   DOB: [birth year], 60 y.o.   MRN: [id number]  [facility]                       Unk Pinto, M.D.        Uvaldo Bristle. Silverio Lay, P.A.-C       Starlyn Skeans, P.A.-C   [facility]                [address] [place], [place] [id number] Telephone [phone] Telefax [phone] _________________________________________________________________________   This very nice 60 y.o. MWM presents for  follow up with Hypertension, Hyperlipidemia, Pre-Diabetes and Vitamin D Deficiency.    Patient is treated for HTN circa 1989 & BP has been controlled at home. Today's BP: 120/70 mmHg. Patient had a negative Heart Cath in 2009 , but in 2012 presentes w/ACS and had PCA/DES. Patient also has hx/o CKD 3 with GFR 58 ml/min. Patient is attending Cardiac Rehab. Patient has had no complaints of any recent cardiac type chest pain, palpitations, dyspnea/orthopnea/PND, dizziness, claudication, or dependent edema.   Hyperlipidemia is controlled with diet & meds. Patient denies myalgias or other med SE's. Last Lipids were at goal with Cholesterol 164; HDL 64; LDL 80; Triglycerides 99 on  01/04/2015.   Also, the patient has history of PreDiabetes since 2008 with A1c 5.7% and has had no symptoms of reactive hypoglycemia, diabetic polys, paresthesias or visual blurring.  Last A1c was 5.4% on 01/04/2015.   Further, the patient also has history of Vitamin D Deficiency of "19" in 2009 and supplements vitamin D without any suspected side-effects. Last vitamin D was 67 on 01/04/2015.  Medication Sig  . aspirin 81 MG tablet Take 81 mg by mouth daily.  Marland Kitchen atenolol 100 MG tablet Take 1 tablet by mouth  daily  . atorvastatin  40 MG tablet Take 1 tablet by mouth  every day   . b complex vitamins tablet Take 1 tablet by mouth daily.  Marland Kitchen BENICAR 40 MG tablet  Take 1/2  tablet   daily   . buPROPion  XL 300 MG Take 1  tablet by mouth  daily for mood  . clopidogrel  75 MG tablet Take 1 tablet by mouth  daily  . Diltiazem CD 240 MG  Take 1 capsule by mouth  daily for blood pressure  . doxazosin  8 MG tablet TAKE 1/2 TO 1 TABLET DAILY FOR BP & PROSTATE  . fenofibrate 160 MG tablet Take 1 tablet by mouth  daily  . fish oil-omega-3  1000 MG Take 1 g by mouth daily. Takes 3 gm daily  . furosemide  80 MG tablet TAKE 1 TABLET BY MOUTH  DAILY FOR BLOOD PRESSURE  AND FLUID  . Vicodin 10-325    . MULTI-VIT w/MINERALS Take 2 tablets by mouth daily.   . pantoprazole  40 MG tablet Take 1 tablet by mouth  daily  . ranitidine  300 MG tablet Take 1 to 2 tablets daily to facilitate tapering off PPI's for heartburn/acid indigestion  . tadalafil  20 MG tablet Take 20 mg by mouth daily as needed for erectile dysfunction.- usu takes 1/4 tab/week  . testosterone cypionate 200 MG/ML inj INJECT 2 ML INTRAMUSCUARLY EVERY 12 DAYS  . meloxicam 15 MG tablet TAKE 1 TALBET BY MOUTH DAILY FOR PAIN/INFLAMMATION   Allergies  Allergen Reactions  . Hctz [Hydrochlorothiazide]   . Tape Rash    Clear tape   PMHx:   Past Medical History  Diagnosis Date  . Hypertension   . Hyperlipidemia   . Diverticulitis   . Hemorrhoids   . GERD (gastroesophageal reflux disease)   . Anxiety   . Meniscus tear 01/02/2011  . Right knee pain 01/02/2011  . Diverticulosis   . Hemarthrosis of knee, right 01/04/2011  . CAD (coronary artery disease), native coronary artery 01/04/2011  . Stented coronary artery 01/04/2011    LAD DES Resolute.  . Hyperlipemia 01/04/2011  . History of meniscectomy of right knee 01/02/2011  . Torn medial meniscus     right knee  . NSVT (nonsustained ventricular tachycardia) ([place]) 01/02/2011  . Atrial flutter, paroxysmal ([place]) 01/04/2011  . Arthritis    Immunization History  Administered Date(s) Administered  . Influenza Split 12/16/2013, 12/02/2014  . Influenza Whole 12/09/2012  . PPD Test 11/05/2013, 01/04/2015  .  Td 02/19/2005  . Tdap 03/01/2015   Past Surgical History  Procedure Laterality Date  . Appendectomy    . Knee arthroscopy      x 8 left  . Rotator cuff repair      bilateral  . Hand surgery      right  . Bicept tendon repair      bilateral  . Tonsillectomy    . Knee arthroscopy Right 04/21/2012    Procedure: ARTHROSCOPY KNEE;  Surgeon: Lorn Junes, MD - Right;  medial and lateral meniscectomies  . Coronary angioplasty with stent placement  2012  . Left heart catheterization with coronary angiogram N/A 01/03/2011    Procedure: LEFT HEART CATHETERIZATION WITH CORONARY ANGIOGRAM;  Surgeon: Lorretta Harp, MD  . Percutaneous coronary stent intervention (pci-s) N/A 01/03/2011    Procedure: PERCUTANEOUS CORONARY STENT INTERVENTION (PCI-S);  Surgeon: Lorretta Harp, MD   FHx:    Reviewed / unchanged  SHx:    Reviewed / unchanged  Systems Review:  Constitutional: Denies fever, chills, wt changes, headaches, insomnia, fatigue, night sweats, change in appetite. Eyes: Denies redness, blurred vision, diplopia, discharge, itchy, watery eyes.  ENT: Denies discharge, congestion, post nasal drip, epistaxis, sore throat, earache, hearing loss, dental pain, tinnitus, vertigo, sinus pain, snoring.  CV: Denies chest pain, palpitations, irregular heartbeat, syncope, dyspnea, diaphoresis, orthopnea, PND, claudication or edema. Respiratory: denies cough, dyspnea, DOE, pleurisy, hoarseness, laryngitis, wheezing.  Gastrointestinal: Denies dysphagia, odynophagia, heartburn, reflux, water brash, abdominal pain or cramps, nausea, vomiting, bloating, diarrhea, constipation, hematemesis, melena, hematochezia  or hemorrhoids. Genitourinary: Denies dysuria, frequency, urgency, nocturia, hesitancy, discharge, hematuria or flank pain. Musculoskeletal: Denies arthralgias, myalgias, stiffness, jt. swelling, pain, limping or strain/sprain.  Skin: Denies pruritus, rash, hives, warts, acne, eczema or change in  skin lesion(s). Neuro: No weakness, tremor, incoordination, spasms, paresthesia or pain. Psychiatric: Denies confusion, memory loss or sensory loss. Endo: Denies change in weight, skin or hair change.  Heme/Lymph: No excessive bleeding, bruising or enlarged lymph nodes.  Physical Exam  BP 120/70 mmHg  Pulse 64  Temp(Src) 97.6 F (36.4 C) (Temporal)  Resp 16  Ht 5\' 8"  (1.727 m)  Wt 198 lb 12.8 oz (90.175 kg)  BMI 30.23 kg/m2  SpO2 96%  Appears well nourished and in no distress. Eyes: PERRLA, EOMs, conjunctiva no swelling or erythema. Sinuses: No frontal/maxillary tenderness ENT/Mouth: EAC's clear, TM's nl w/o erythema, bulging. Nares clear w/o erythema, swelling, exudates. Oropharynx clear without erythema or exudates. Oral hygiene is good. Tongue normal, non obstructing.  Hearing intact.  Neck: Supple. Thyroid nl. Car 2+/2+ without bruits, nodes or JVD. Chest: Respirations nl with BS clear & equal w/o rales, rhonchi, wheezing or stridor.  Cor: Heart sounds normal w/ regular rate and rhythm without sig. murmurs, gallops, clicks, or rubs. Peripheral pulses normal and equal  without edema.  Abdomen: Soft & bowel sounds normal. Non-tender w/o guarding, rebound, hernias, masses, or organomegaly.  Lymphatics: Unremarkable.  Musculoskeletal: Full ROM all peripheral extremities, joint stability, 5/5 strength, and normal gait.  Skin: Warm, dry without exposed rashes, lesions or ecchymosis apparent.  Neuro: Cranial nerves intact, reflexes equal bilaterally. Sensory-motor testing grossly intact. Tendon reflexes grossly intact.  Pysch: Alert & oriented x 3.  Insight and judgement nl & appropriate. No ideations.  Assessment and Plan:  1. Essential hypertension  - TSH  - Patient is advised to limit his Meloxicam to 3 to 4 x/week max and at his pleedings was given an Rx Norco 5 / #30 tabs - to use also very sparingly.   2. Hyperlipidemia  - Lipid panel - TSH  3. History of meniscectomy  of right knee   4. Prediabetes  - Hemoglobin A1c - Insulin, random  5. Vitamin D deficiency  - VITAMIN D 25 Hydroxy   6. Testosterone deficiency  - Testosterone  7. Medication management  - CBC with Differential/Platelet - BASIC METABOLIC PANEL WITH GFR - Hepatic function panel - Magnesium   Recommended regular exercise, BP monitoring, weight control, and discussed med and SE's. Recommended labs to assess and monitor clinical status. Further disposition pending results of labs. Over 30 minutes of exam, counseling, chart review was performed

## 2015-07-12 LAB — HEMOGLOBIN A1C
Hgb A1c MFr Bld: 5.6 % (ref <5.7)
MEAN PLASMA GLUCOSE: 114 mg/dL

## 2015-07-12 LAB — TESTOSTERONE: Testosterone: 995 ng/dL — ABNORMAL HIGH (ref 250–827)

## 2015-07-12 LAB — INSULIN, RANDOM: Insulin: 2.9 u[IU]/mL (ref 2.0–19.6)

## 2015-07-12 LAB — VITAMIN D 25 HYDROXY (VIT D DEFICIENCY, FRACTURES): VIT D 25 HYDROXY: 59 ng/mL (ref 30–100)

## 2015-07-13 ENCOUNTER — Encounter (HOSPITAL_COMMUNITY)
Admission: RE | Admit: 2015-07-13 | Discharge: 2015-07-13 | Disposition: A | Discharge status: Home / Self Care | Payer: Self-pay | Source: Ambulatory Visit | Attending: Cardiovascular Disease | Admitting: Cardiovascular Disease

## 2015-07-14 ENCOUNTER — Other Ambulatory Visit: Payer: Self-pay | Admitting: Cardiovascular Disease

## 2015-07-14 NOTE — Telephone Encounter (Signed)
Rx request sent to pharmacy.  

## 2015-07-15 ENCOUNTER — Encounter (HOSPITAL_COMMUNITY): Payer: Self-pay

## 2015-07-20 ENCOUNTER — Encounter (HOSPITAL_COMMUNITY)
Admission: RE | Admit: 2015-07-20 | Discharge: 2015-07-20 | Disposition: A | Discharge status: Home / Self Care | Payer: Self-pay | Source: Ambulatory Visit | Attending: Cardiovascular Disease | Admitting: Cardiovascular Disease

## 2015-07-22 ENCOUNTER — Encounter (HOSPITAL_COMMUNITY)
Admission: RE | Admit: 2015-07-22 | Discharge: 2015-07-22 | Disposition: A | Discharge status: Home / Self Care | Payer: Self-pay | Source: Ambulatory Visit | Attending: Cardiovascular Disease | Admitting: Cardiovascular Disease

## 2015-07-22 DIAGNOSIS — Z955 Presence of coronary angioplasty implant and graft: Secondary | ICD-10-CM | POA: Insufficient documentation

## 2015-07-25 ENCOUNTER — Encounter (HOSPITAL_COMMUNITY)
Admission: RE | Admit: 2015-07-25 | Discharge: 2015-07-25 | Disposition: A | Discharge status: Home / Self Care | Payer: Self-pay | Source: Ambulatory Visit | Attending: Cardiovascular Disease | Admitting: Cardiovascular Disease

## 2015-07-27 ENCOUNTER — Encounter (HOSPITAL_COMMUNITY): Payer: Self-pay

## 2015-07-29 ENCOUNTER — Other Ambulatory Visit: Payer: Self-pay | Admitting: Internal Medicine

## 2015-07-29 ENCOUNTER — Encounter (HOSPITAL_COMMUNITY)
Admission: RE | Admit: 2015-07-29 | Discharge: 2015-07-29 | Disposition: A | Discharge status: Home / Self Care | Payer: Self-pay | Source: Ambulatory Visit | Attending: Cardiovascular Disease | Admitting: Cardiovascular Disease

## 2015-07-29 NOTE — Telephone Encounter (Signed)
Rx called in to [facility]. 

## 2015-08-01 ENCOUNTER — Encounter (HOSPITAL_COMMUNITY)
Admission: RE | Admit: 2015-08-01 | Discharge: 2015-08-01 | Disposition: A | Discharge status: Home / Self Care | Payer: Self-pay | Source: Ambulatory Visit | Attending: Cardiovascular Disease | Admitting: Cardiovascular Disease

## 2015-08-03 ENCOUNTER — Encounter (HOSPITAL_COMMUNITY): Payer: Self-pay

## 2015-08-05 ENCOUNTER — Other Ambulatory Visit: Payer: Self-pay | Admitting: Internal Medicine

## 2015-08-05 ENCOUNTER — Encounter (HOSPITAL_COMMUNITY)
Admission: RE | Admit: 2015-08-05 | Discharge: 2015-08-05 | Disposition: A | Discharge status: Home / Self Care | Payer: Self-pay | Source: Ambulatory Visit | Attending: Cardiovascular Disease | Admitting: Cardiovascular Disease

## 2015-08-08 ENCOUNTER — Encounter (HOSPITAL_COMMUNITY)
Admission: RE | Admit: 2015-08-08 | Discharge: 2015-08-08 | Disposition: A | Discharge status: Home / Self Care | Payer: Self-pay | Source: Ambulatory Visit | Attending: Cardiovascular Disease | Admitting: Cardiovascular Disease

## 2015-08-08 DIAGNOSIS — Z955 Presence of coronary angioplasty implant and graft: Secondary | ICD-10-CM

## 2015-08-10 ENCOUNTER — Encounter (HOSPITAL_COMMUNITY)
Admission: RE | Admit: 2015-08-10 | Discharge: 2015-08-10 | Disposition: A | Discharge status: Home / Self Care | Payer: Self-pay | Source: Ambulatory Visit | Attending: Cardiovascular Disease | Admitting: Cardiovascular Disease

## 2015-08-12 ENCOUNTER — Encounter (HOSPITAL_COMMUNITY)
Admission: RE | Admit: 2015-08-12 | Discharge: 2015-08-12 | Disposition: A | Discharge status: Home / Self Care | Payer: Self-pay | Source: Ambulatory Visit | Attending: Cardiovascular Disease | Admitting: Cardiovascular Disease

## 2015-08-15 ENCOUNTER — Encounter (HOSPITAL_COMMUNITY)
Admission: RE | Admit: 2015-08-15 | Discharge: 2015-08-15 | Disposition: A | Discharge status: Home / Self Care | Payer: Self-pay | Source: Ambulatory Visit | Attending: Cardiovascular Disease | Admitting: Cardiovascular Disease

## 2015-08-17 ENCOUNTER — Encounter (HOSPITAL_COMMUNITY)
Admission: RE | Admit: 2015-08-17 | Discharge: 2015-08-17 | Disposition: A | Discharge status: Home / Self Care | Payer: Self-pay | Source: Ambulatory Visit | Attending: Cardiovascular Disease | Admitting: Cardiovascular Disease

## 2015-08-19 ENCOUNTER — Encounter (HOSPITAL_COMMUNITY): Payer: Self-pay

## 2015-08-22 ENCOUNTER — Encounter (HOSPITAL_COMMUNITY): Payer: Self-pay

## 2015-08-22 DIAGNOSIS — Z955 Presence of coronary angioplasty implant and graft: Secondary | ICD-10-CM | POA: Insufficient documentation

## 2015-08-24 ENCOUNTER — Encounter (HOSPITAL_COMMUNITY): Payer: Self-pay

## 2015-08-26 ENCOUNTER — Encounter (HOSPITAL_COMMUNITY): Payer: Self-pay

## 2015-08-27 ENCOUNTER — Other Ambulatory Visit: Payer: Self-pay | Admitting: Internal Medicine

## 2015-08-29 ENCOUNTER — Encounter (HOSPITAL_COMMUNITY)
Admission: RE | Admit: 2015-08-29 | Discharge: 2015-08-29 | Disposition: A | Discharge status: Home / Self Care | Payer: Self-pay | Source: Ambulatory Visit | Attending: Cardiovascular Disease | Admitting: Cardiovascular Disease

## 2015-08-31 ENCOUNTER — Encounter (HOSPITAL_COMMUNITY)
Admission: RE | Admit: 2015-08-31 | Discharge: 2015-08-31 | Disposition: A | Discharge status: Home / Self Care | Payer: Self-pay | Source: Ambulatory Visit | Attending: Cardiovascular Disease | Admitting: Cardiovascular Disease

## 2015-09-02 ENCOUNTER — Encounter (HOSPITAL_COMMUNITY)
Admission: RE | Admit: 2015-09-02 | Discharge: 2015-09-02 | Disposition: A | Discharge status: Home / Self Care | Payer: Self-pay | Source: Ambulatory Visit | Attending: Cardiovascular Disease | Admitting: Cardiovascular Disease

## 2015-09-05 ENCOUNTER — Encounter (HOSPITAL_COMMUNITY)
Admission: RE | Admit: 2015-09-05 | Discharge: 2015-09-05 | Disposition: A | Discharge status: Home / Self Care | Payer: Self-pay | Source: Ambulatory Visit | Attending: Cardiovascular Disease | Admitting: Cardiovascular Disease

## 2015-09-07 ENCOUNTER — Encounter (HOSPITAL_COMMUNITY)
Admission: RE | Admit: 2015-09-07 | Discharge: 2015-09-07 | Disposition: A | Discharge status: Home / Self Care | Payer: Self-pay | Source: Ambulatory Visit | Attending: Cardiovascular Disease | Admitting: Cardiovascular Disease

## 2015-09-09 ENCOUNTER — Encounter (HOSPITAL_COMMUNITY)
Admission: RE | Admit: 2015-09-09 | Discharge: 2015-09-09 | Disposition: A | Discharge status: Home / Self Care | Payer: Self-pay | Source: Ambulatory Visit | Attending: Cardiovascular Disease | Admitting: Cardiovascular Disease

## 2015-09-12 ENCOUNTER — Encounter (HOSPITAL_COMMUNITY)
Admission: RE | Admit: 2015-09-12 | Discharge: 2015-09-12 | Disposition: A | Discharge status: Home / Self Care | Payer: Self-pay | Source: Ambulatory Visit | Attending: Cardiovascular Disease | Admitting: Cardiovascular Disease

## 2015-09-14 ENCOUNTER — Encounter (HOSPITAL_COMMUNITY): Payer: Self-pay

## 2015-09-16 ENCOUNTER — Encounter (HOSPITAL_COMMUNITY)
Admission: RE | Admit: 2015-09-16 | Discharge: 2015-09-16 | Disposition: A | Discharge status: Home / Self Care | Payer: Self-pay | Source: Ambulatory Visit | Attending: Cardiovascular Disease | Admitting: Cardiovascular Disease

## 2015-09-19 ENCOUNTER — Encounter (HOSPITAL_COMMUNITY)
Admission: RE | Admit: 2015-09-19 | Discharge: 2015-09-19 | Disposition: A | Discharge status: Home / Self Care | Payer: Self-pay | Source: Ambulatory Visit | Attending: Cardiovascular Disease | Admitting: Cardiovascular Disease

## 2015-09-21 ENCOUNTER — Encounter (HOSPITAL_COMMUNITY)
Admission: RE | Admit: 2015-09-21 | Discharge: 2015-09-21 | Disposition: A | Discharge status: Home / Self Care | Payer: Self-pay | Source: Ambulatory Visit | Attending: Cardiovascular Disease | Admitting: Cardiovascular Disease

## 2015-09-21 DIAGNOSIS — Z955 Presence of coronary angioplasty implant and graft: Secondary | ICD-10-CM | POA: Insufficient documentation

## 2015-09-23 ENCOUNTER — Encounter (HOSPITAL_COMMUNITY): Payer: Self-pay

## 2015-09-24 ENCOUNTER — Other Ambulatory Visit: Payer: Self-pay | Admitting: Internal Medicine

## 2015-09-26 ENCOUNTER — Encounter (HOSPITAL_COMMUNITY): Payer: Self-pay

## 2015-09-28 ENCOUNTER — Encounter (HOSPITAL_COMMUNITY)
Admission: RE | Admit: 2015-09-28 | Discharge: 2015-09-28 | Disposition: A | Discharge status: Home / Self Care | Payer: Self-pay | Source: Ambulatory Visit | Attending: Cardiovascular Disease | Admitting: Cardiovascular Disease

## 2015-09-30 ENCOUNTER — Encounter (HOSPITAL_COMMUNITY)
Admission: RE | Admit: 2015-09-30 | Discharge: 2015-09-30 | Disposition: A | Discharge status: Home / Self Care | Payer: Self-pay | Source: Ambulatory Visit | Attending: Cardiovascular Disease | Admitting: Cardiovascular Disease

## 2015-10-03 ENCOUNTER — Encounter (HOSPITAL_COMMUNITY)
Admission: RE | Admit: 2015-10-03 | Discharge: 2015-10-03 | Disposition: A | Discharge status: Home / Self Care | Payer: Self-pay | Source: Ambulatory Visit | Attending: Cardiovascular Disease | Admitting: Cardiovascular Disease

## 2015-10-05 ENCOUNTER — Encounter (HOSPITAL_COMMUNITY)
Admission: RE | Admit: 2015-10-05 | Discharge: 2015-10-05 | Disposition: A | Discharge status: Home / Self Care | Payer: Self-pay | Source: Ambulatory Visit | Attending: Cardiovascular Disease | Admitting: Cardiovascular Disease

## 2015-10-07 ENCOUNTER — Encounter (HOSPITAL_COMMUNITY): Payer: Self-pay

## 2015-10-10 ENCOUNTER — Encounter (HOSPITAL_COMMUNITY)
Admission: RE | Admit: 2015-10-10 | Discharge: 2015-10-10 | Disposition: A | Discharge status: Home / Self Care | Payer: Self-pay | Source: Ambulatory Visit | Attending: Cardiovascular Disease | Admitting: Cardiovascular Disease

## 2015-10-12 ENCOUNTER — Other Ambulatory Visit: Payer: Self-pay | Admitting: Internal Medicine

## 2015-10-12 ENCOUNTER — Ambulatory Visit: Payer: Self-pay | Admitting: Physician Assistant

## 2015-10-12 ENCOUNTER — Encounter (HOSPITAL_COMMUNITY)
Admission: RE | Admit: 2015-10-12 | Discharge: 2015-10-12 | Disposition: A | Discharge status: Home / Self Care | Payer: Self-pay | Source: Ambulatory Visit | Attending: Cardiovascular Disease | Admitting: Cardiovascular Disease

## 2015-10-12 ENCOUNTER — Other Ambulatory Visit: Payer: 59

## 2015-10-12 DIAGNOSIS — E559 Vitamin D deficiency, unspecified: Secondary | ICD-10-CM

## 2015-10-12 DIAGNOSIS — E785 Hyperlipidemia, unspecified: Secondary | ICD-10-CM

## 2015-10-12 DIAGNOSIS — R7303 Prediabetes: Secondary | ICD-10-CM

## 2015-10-12 DIAGNOSIS — Z79899 Other long term (current) drug therapy: Secondary | ICD-10-CM

## 2015-10-12 DIAGNOSIS — E349 Endocrine disorder, unspecified: Secondary | ICD-10-CM

## 2015-10-12 DIAGNOSIS — I10 Essential (primary) hypertension: Secondary | ICD-10-CM

## 2015-10-12 LAB — BASIC METABOLIC PANEL WITH GFR
BUN: 30 mg/dL — ABNORMAL HIGH (ref 7–25)
CALCIUM: 10.1 mg/dL (ref 8.6–10.3)
CO2: 23 mmol/L (ref 20–31)
CREATININE: 1.73 mg/dL — AB (ref 0.70–1.25)
Chloride: 103 mmol/L (ref 98–110)
GFR, EST AFRICAN AMERICAN: 49 mL/min — AB (ref >=60)
GFR, EST NON AFRICAN AMERICAN: 42 mL/min — AB (ref >=60)
GLUCOSE: 82 mg/dL (ref 65–99)
Potassium: 4 mmol/L (ref 3.5–5.3)
SODIUM: 139 mmol/L (ref 135–146)

## 2015-10-12 LAB — CBC WITH DIFFERENTIAL/PLATELET
Basophils Absolute: 0 cells/uL (ref 0–200)
Basophils Relative: 0 %
EOS PCT: 2 %
Eosinophils Absolute: 144 cells/uL (ref 15–500)
HCT: 46.6 % (ref 38.5–50.0)
Hemoglobin: 15.6 g/dL (ref 13.2–17.1)
LYMPHS PCT: 23 %
Lymphs Abs: 1656 cells/uL (ref 850–3900)
MCH: 32.9 pg (ref 27.0–33.0)
MCHC: 33.5 g/dL (ref 32.0–36.0)
MCV: 98.3 fL (ref 80.0–100.0)
MPV: 10.3 fL (ref 7.5–12.5)
Monocytes Absolute: 936 cells/uL (ref 200–950)
Monocytes Relative: 13 %
NEUTROS PCT: 62 %
Neutro Abs: 4464 cells/uL (ref 1500–7800)
Platelets: 189 10*3/uL (ref 140–400)
RBC: 4.74 MIL/uL (ref 4.20–5.80)
RDW: 13 % (ref 11.0–15.0)
WBC: 7.2 10*3/uL (ref 3.8–10.8)

## 2015-10-12 LAB — TSH: TSH: 1.07 mIU/L (ref 0.40–4.50)

## 2015-10-12 LAB — HEPATIC FUNCTION PANEL
ALT: 33 U/L (ref 9–46)
AST: 43 U/L — ABNORMAL HIGH (ref 10–35)
Albumin: 4.5 g/dL (ref 3.6–5.1)
Alkaline Phosphatase: 35 U/L — ABNORMAL LOW (ref 40–115)
Bilirubin, Direct: 0.2 mg/dL (ref <=0.2)
Indirect Bilirubin: 0.9 mg/dL (ref 0.2–1.2)
Total Bilirubin: 1.1 mg/dL (ref 0.2–1.2)
Total Protein: 7.2 g/dL (ref 6.1–8.1)

## 2015-10-12 LAB — LIPID PANEL
Cholesterol: 153 mg/dL (ref 125–200)
HDL: 52 mg/dL (ref >=40)
LDL Cholesterol: 75 mg/dL (ref <130)
Total CHOL/HDL Ratio: 2.9 Ratio (ref <=5.0)
Triglycerides: 129 mg/dL (ref <150)
VLDL: 26 mg/dL (ref <30)

## 2015-10-12 LAB — MAGNESIUM: MAGNESIUM: 1.8 mg/dL (ref 1.5–2.5)

## 2015-10-13 LAB — HEMOGLOBIN A1C
Hgb A1c MFr Bld: 5 % (ref <5.7)
Mean Plasma Glucose: 97 mg/dL

## 2015-10-13 LAB — INSULIN, RANDOM: INSULIN: 2.5 u[IU]/mL (ref 2.0–19.6)

## 2015-10-13 LAB — VITAMIN D 25 HYDROXY (VIT D DEFICIENCY, FRACTURES): Vit D, 25-Hydroxy: 81 ng/mL (ref 30–100)

## 2015-10-13 LAB — TESTOSTERONE: Testosterone: 944 ng/dL — ABNORMAL HIGH (ref 250–827)

## 2015-10-14 ENCOUNTER — Encounter (HOSPITAL_COMMUNITY): Payer: Self-pay

## 2015-10-17 ENCOUNTER — Encounter (HOSPITAL_COMMUNITY)
Admission: RE | Admit: 2015-10-17 | Discharge: 2015-10-17 | Disposition: A | Discharge status: Home / Self Care | Payer: Self-pay | Source: Ambulatory Visit | Attending: Cardiovascular Disease | Admitting: Cardiovascular Disease

## 2015-10-19 ENCOUNTER — Encounter (HOSPITAL_COMMUNITY)
Admission: RE | Admit: 2015-10-19 | Discharge: 2015-10-19 | Disposition: A | Discharge status: Home / Self Care | Payer: Self-pay | Source: Ambulatory Visit | Attending: Cardiovascular Disease | Admitting: Cardiovascular Disease

## 2015-10-21 ENCOUNTER — Encounter (HOSPITAL_COMMUNITY)
Admission: RE | Admit: 2015-10-21 | Discharge: 2015-10-21 | Disposition: A | Discharge status: Home / Self Care | Payer: Self-pay | Source: Ambulatory Visit | Attending: Cardiovascular Disease | Admitting: Cardiovascular Disease

## 2015-10-21 DIAGNOSIS — Z955 Presence of coronary angioplasty implant and graft: Secondary | ICD-10-CM | POA: Insufficient documentation

## 2015-10-24 ENCOUNTER — Encounter: Payer: Self-pay | Admitting: *Deleted

## 2015-10-26 ENCOUNTER — Encounter (HOSPITAL_COMMUNITY): Payer: Self-pay

## 2015-10-28 ENCOUNTER — Encounter (HOSPITAL_COMMUNITY): Payer: Self-pay

## 2015-10-31 ENCOUNTER — Encounter (HOSPITAL_COMMUNITY)
Admission: RE | Admit: 2015-10-31 | Discharge: 2015-10-31 | Disposition: A | Discharge status: Home / Self Care | Payer: Self-pay | Source: Ambulatory Visit | Attending: Cardiovascular Disease | Admitting: Cardiovascular Disease

## 2015-11-02 ENCOUNTER — Encounter (HOSPITAL_COMMUNITY)
Admission: RE | Admit: 2015-11-02 | Discharge: 2015-11-02 | Disposition: A | Discharge status: Home / Self Care | Payer: Self-pay | Source: Ambulatory Visit | Attending: Cardiovascular Disease | Admitting: Cardiovascular Disease

## 2015-11-03 ENCOUNTER — Encounter: Payer: Self-pay | Admitting: Internal Medicine

## 2015-11-03 ENCOUNTER — Ambulatory Visit (INDEPENDENT_AMBULATORY_CARE_PROVIDER_SITE_OTHER): Payer: 59 | Admitting: Internal Medicine

## 2015-11-03 VITALS — BP 112/64 | HR 56 | Temp 98.6°F | Resp 16 | Ht 68.0 in | Wt 200.0 lb

## 2015-11-03 DIAGNOSIS — E785 Hyperlipidemia, unspecified: Secondary | ICD-10-CM | POA: Diagnosis not present

## 2015-11-03 DIAGNOSIS — I2510 Atherosclerotic heart disease of native coronary artery without angina pectoris: Secondary | ICD-10-CM

## 2015-11-03 DIAGNOSIS — E291 Testicular hypofunction: Secondary | ICD-10-CM

## 2015-11-03 DIAGNOSIS — I10 Essential (primary) hypertension: Secondary | ICD-10-CM

## 2015-11-03 DIAGNOSIS — R7303 Prediabetes: Secondary | ICD-10-CM

## 2015-11-03 DIAGNOSIS — E559 Vitamin D deficiency, unspecified: Secondary | ICD-10-CM

## 2015-11-03 DIAGNOSIS — N184 Chronic kidney disease, stage 4 (severe): Secondary | ICD-10-CM

## 2015-11-03 DIAGNOSIS — E349 Endocrine disorder, unspecified: Secondary | ICD-10-CM

## 2015-11-03 DIAGNOSIS — Z79899 Other long term (current) drug therapy: Secondary | ICD-10-CM

## 2015-11-03 LAB — BASIC METABOLIC PANEL
BUN: 21 mg/dL (ref 7–25)
CALCIUM: 9.6 mg/dL (ref 8.6–10.3)
CO2: 24 mmol/L (ref 20–31)
CREATININE: 1.45 mg/dL — AB (ref 0.70–1.25)
Chloride: 103 mmol/L (ref 98–110)
GLUCOSE: 81 mg/dL (ref 65–99)
Potassium: 4.1 mmol/L (ref 3.5–5.3)
SODIUM: 138 mmol/L (ref 135–146)

## 2015-11-03 NOTE — Progress Notes (Signed)
Assessment and Plan:  Hypertension:  -Continue medication,  -monitor blood pressure at home.  -Continue DASH diet.   -Reminder to go to the ER if any CP, SOB, nausea, dizziness, severe HA, changes vision/speech, left arm numbness and tingling, and jaw pain.  Cholesterol: -Continue diet and exercise.   Pre-diabetes: -Continue diet and exercise.   Vitamin D Def: -continue medications.   Bilateral knee osteoarthritis -followed by Dr. Noemi Chapel -recommended continuing injections as tolerated  -discussed knee replacements  CAD -cont cardiac rehab -diet and exercise -cont blood pressure control  Testosterone Def -cont injections -level acceptable  Decreased Kidney function -BMET -increase water intake -if still declining try stopping mobic  Continue diet and meds as discussed. Further disposition pending results of labs.  HPI 61 y.o. male  presents for 3 month follow up with hypertension, hyperlipidemia, prediabetes and vitamin D.   His blood pressure has been controlled at home, today their BP is BP: 112/64.   He does workout. He denies chest pain, shortness of breath, dizziness.  He reports that he still goes to cardiac rehab 3 times per week.  He reports that he is trying to stay in shape as best he can.  He is up to 20 minutes of cardio and is also doing some weight lifting.     He is on cholesterol medication and denies myalgias. His cholesterol is at goal. The cholesterol last visit was:   Lab Results  Component Value Date   CHOL 153 10/12/2015   HDL 52 10/12/2015   LDLCALC 75 10/12/2015   TRIG 129 10/12/2015   CHOLHDL 2.9 10/12/2015     He has been working on diet and exercise for prediabetes, and denies foot ulcerations, hyperglycemia, hypoglycemia , increased appetite, nausea, paresthesia of the feet, polydipsia, polyuria, visual disturbances, vomiting and weight loss. Last A1C in the office was:  Lab Results  Component Value Date   HGBA1C 5.0 10/12/2015     Patient is on Vitamin D supplement.  Lab Results  Component Value Date   VD25OH 77 10/12/2015       He reports that he recently had bilateral knee injections with Dr. Noemi Chapel.  He reports that they have been feeling much better.  He reports that he thinks it was from mowing the yard.   He is still doing fine with testosterone injections.  He reports that he feels like it is helping his sex drive.  He does still take cialis when needed.    Current Medications:  Current Outpatient Prescriptions on File Prior to Visit  Medication Sig Dispense Refill  . aspirin 81 MG tablet Take 81 mg by mouth daily.    Marland Kitchen atenolol (TENORMIN) 100 MG tablet Take 1 tablet by mouth  daily 90 tablet 1  . atorvastatin (LIPITOR) 40 MG tablet Take 1 tablet by mouth  daily for cholesterol 90 tablet 1  . b complex vitamins tablet Take 1 tablet by mouth daily.    . B-D 3CC LUER-LOK SYR 21GX1" 21G X 1" 3 ML MISC USE WITH TESTOSTERONE AS DIRECTED 12 each 3  . BENICAR 40 MG tablet Take 1 tablet by mouth  daily for blood pressure 90 tablet 1  . buPROPion (WELLBUTRIN XL) 300 MG 24 hr tablet Take 1 tablet by mouth  daily for mood 90 tablet 1  . clopidogrel (PLAVIX) 75 MG tablet Take 1 tablet by mouth  daily 90 tablet 2  . diltiazem (CARDIZEM CD) 240 MG 24 hr capsule Take 1 capsule by mouth  daily for blood pressure 90 capsule 1  . doxazosin (CARDURA) 8 MG tablet TAKE 1/2 TO 1 TABLET DAILY FOR BP & PROSTATE 90 tablet 1  . fenofibrate 160 MG tablet Take 1 tablet by mouth  daily 90 tablet 3  . fish oil-omega-3 fatty acids 1000 MG capsule Take 1 g by mouth daily. Takes 3 gm daily    . furosemide (LASIX) 80 MG tablet TAKE 1 TABLET BY MOUTH  DAILY FOR BLOOD PRESSURE  AND FLUID 90 tablet 1  . HYDROcodone-acetaminophen (NORCO) 10-325 MG per tablet   0  . meloxicam (MOBIC) 15 MG tablet TAKE 1 TALBET BY MOUTH DAILY FOR PAIN/INFLAMMATION 90 tablet 1  . Multiple Vitamin (MULTIVITAMIN WITH MINERALS) TABS Take 2 tablets by mouth  daily.     Marland Kitchen NITROSTAT 0.4 MG SL tablet PLACE 1 TABLET (0.4 MG TOTAL) UNDER THE TONGUE EVERY 5 (FIVE) MINUTES AS NEEDED FOR CHEST PAIN. 25 tablet 4  . pantoprazole (PROTONIX) 40 MG tablet Take 1 tablet by mouth  daily 90 tablet 3  . ranitidine (ZANTAC) 300 MG tablet Take 1 to 2 tablets daily to facilitate tapering off PPI's for heartburn/acid indigestion 90 tablet 99  . tadalafil (CIALIS) 20 MG tablet Take 20 mg by mouth daily as needed for erectile dysfunction.    Marland Kitchen testosterone cypionate (DEPOTESTOSTERONE CYPIONATE) 200 MG/ML injection INJECT 2 ML INTRAMUSCUARLY EVERY 12 DAYS 10 mL 1   No current facility-administered medications on file prior to visit.     Medical History:  Past Medical History:  Diagnosis Date  . Anxiety   . Arthritis   . Atrial flutter, paroxysmal ([place]) 01/04/2011  . CAD (coronary artery disease), native coronary artery 01/04/2011  . Diverticulitis   . Diverticulosis   . GERD (gastroesophageal reflux disease)   . Hemarthrosis of knee, right 01/04/2011  . Hemorrhoids   . History of meniscectomy of right knee 01/02/2011  . Hyperlipemia 01/04/2011  . Hyperlipidemia   . Hypertension   . Meniscus tear 01/02/2011  . NSVT (nonsustained ventricular tachycardia) ([place]) 01/02/2011  . Right knee pain 01/02/2011  . Stented coronary artery 01/04/2011   LAD DES Resolute.  . Torn medial meniscus    right knee    Allergies:  Allergies  Allergen Reactions  . Hctz [Hydrochlorothiazide]   . Tape Rash    Clear tape     Review of Systems:  Review of Systems  Constitutional: Negative for chills, fever and malaise/fatigue.  HENT: Negative for congestion, ear pain and sore throat.   Eyes: Negative.   Respiratory: Negative for cough, shortness of breath and wheezing.   Cardiovascular: Negative for chest pain, palpitations and leg swelling.  Gastrointestinal: Negative for abdominal pain, blood in stool, constipation, diarrhea, heartburn and melena.  Genitourinary:  Negative.   Skin: Negative.   Neurological: Negative for dizziness, sensory change, loss of consciousness and headaches.  Psychiatric/Behavioral: Negative for depression. The patient is not nervous/anxious and does not have insomnia.     Family history- Review and unchanged  Social history- Review and unchanged  Physical Exam: BP 112/64   Pulse (!) 56   Temp 98.6 F (37 C) (Temporal)   Resp 16   Ht 5\' 8"  (1.727 m)   Wt 200 lb (90.7 kg)   BMI 30.41 kg/m  Wt Readings from Last 3 Encounters:  11/03/15 200 lb (90.7 kg)  07/11/15 198 lb 12.8 oz (90.2 kg)  04/12/15 204 lb (92.5 kg)    General Appearance: Well nourished well developed, in no  apparent distress. Eyes: PERRLA, EOMs, conjunctiva no swelling or erythema ENT/Mouth: Ear canals normal without obstruction, swelling, erythma, discharge.  TMs normal bilaterally.  Oropharynx moist, clear, without exudate, or postoropharyngeal swelling. Neck: Supple, thyroid normal,no cervical adenopathy  Respiratory: Respiratory effort normal, Breath sounds clear A&P without rhonchi, wheeze, or rale.  No retractions, no accessory usage. Cardio: RRR with no MRGs. Brisk peripheral pulses without edema.  Abdomen: Soft, + BS,  Non tender, no guarding, rebound, hernias, masses. Musculoskeletal: Full ROM, 5/5 strength, Normal gait Skin: Warm, dry without rashes, lesions, ecchymosis.  Neuro: Awake and oriented X 3, Cranial nerves intact. Normal muscle tone, no cerebellar symptoms. Psych: Normal affect, Insight and Judgment appropriate.    Starlyn Skeans, PA-C 11:21 AM [facility]

## 2015-11-04 ENCOUNTER — Encounter (HOSPITAL_COMMUNITY)
Admission: RE | Admit: 2015-11-04 | Discharge: 2015-11-04 | Disposition: A | Discharge status: Home / Self Care | Payer: Self-pay | Source: Ambulatory Visit | Attending: Cardiovascular Disease | Admitting: Cardiovascular Disease

## 2015-11-07 ENCOUNTER — Other Ambulatory Visit: Payer: Self-pay | Admitting: Internal Medicine

## 2015-11-07 ENCOUNTER — Encounter (HOSPITAL_COMMUNITY)
Admission: RE | Admit: 2015-11-07 | Discharge: 2015-11-07 | Disposition: A | Discharge status: Home / Self Care | Payer: Self-pay | Source: Ambulatory Visit | Attending: Cardiovascular Disease | Admitting: Cardiovascular Disease

## 2015-11-09 ENCOUNTER — Encounter (HOSPITAL_COMMUNITY)
Admission: RE | Admit: 2015-11-09 | Discharge: 2015-11-09 | Disposition: A | Discharge status: Home / Self Care | Payer: Self-pay | Source: Ambulatory Visit | Attending: Cardiovascular Disease | Admitting: Cardiovascular Disease

## 2015-11-11 ENCOUNTER — Encounter (HOSPITAL_COMMUNITY): Payer: Self-pay

## 2015-11-14 ENCOUNTER — Encounter (HOSPITAL_COMMUNITY)
Admission: RE | Admit: 2015-11-14 | Discharge: 2015-11-14 | Disposition: A | Discharge status: Home / Self Care | Payer: Self-pay | Source: Ambulatory Visit | Attending: Cardiovascular Disease | Admitting: Cardiovascular Disease

## 2015-11-16 ENCOUNTER — Encounter (HOSPITAL_COMMUNITY)
Admission: RE | Admit: 2015-11-16 | Discharge: 2015-11-16 | Disposition: A | Discharge status: Home / Self Care | Payer: Self-pay | Source: Ambulatory Visit | Attending: Cardiovascular Disease | Admitting: Cardiovascular Disease

## 2015-11-18 ENCOUNTER — Encounter (HOSPITAL_COMMUNITY): Payer: Self-pay

## 2015-11-21 ENCOUNTER — Encounter (HOSPITAL_COMMUNITY)
Admission: RE | Admit: 2015-11-21 | Discharge: 2015-11-21 | Disposition: A | Discharge status: Home / Self Care | Payer: Self-pay | Source: Ambulatory Visit | Attending: Cardiovascular Disease | Admitting: Cardiovascular Disease

## 2015-11-21 DIAGNOSIS — Z955 Presence of coronary angioplasty implant and graft: Secondary | ICD-10-CM | POA: Insufficient documentation

## 2015-11-22 ENCOUNTER — Other Ambulatory Visit: Payer: Self-pay | Admitting: Physician Assistant

## 2015-11-23 ENCOUNTER — Encounter (HOSPITAL_COMMUNITY)
Admission: RE | Admit: 2015-11-23 | Discharge: 2015-11-23 | Disposition: A | Discharge status: Home / Self Care | Payer: Self-pay | Source: Ambulatory Visit | Attending: Cardiovascular Disease | Admitting: Cardiovascular Disease

## 2015-11-25 ENCOUNTER — Encounter (HOSPITAL_COMMUNITY): Payer: Self-pay

## 2015-11-28 ENCOUNTER — Encounter (HOSPITAL_COMMUNITY)
Admission: RE | Admit: 2015-11-28 | Discharge: 2015-11-28 | Disposition: A | Discharge status: Home / Self Care | Payer: Self-pay | Source: Ambulatory Visit | Attending: Cardiovascular Disease | Admitting: Cardiovascular Disease

## 2015-11-30 ENCOUNTER — Encounter (HOSPITAL_COMMUNITY)
Admission: RE | Admit: 2015-11-30 | Discharge: 2015-11-30 | Disposition: A | Discharge status: Home / Self Care | Payer: Self-pay | Source: Ambulatory Visit | Attending: Cardiovascular Disease | Admitting: Cardiovascular Disease

## 2015-12-01 ENCOUNTER — Ambulatory Visit (INDEPENDENT_AMBULATORY_CARE_PROVIDER_SITE_OTHER): Payer: 59

## 2015-12-01 DIAGNOSIS — Z23 Encounter for immunization: Secondary | ICD-10-CM | POA: Diagnosis not present

## 2015-12-02 ENCOUNTER — Encounter (HOSPITAL_COMMUNITY)
Admission: RE | Admit: 2015-12-02 | Discharge: 2015-12-02 | Disposition: A | Discharge status: Home / Self Care | Payer: Self-pay | Source: Ambulatory Visit | Attending: Cardiovascular Disease | Admitting: Cardiovascular Disease

## 2015-12-05 ENCOUNTER — Encounter (HOSPITAL_COMMUNITY): Payer: Self-pay

## 2015-12-07 ENCOUNTER — Encounter (HOSPITAL_COMMUNITY)
Admission: RE | Admit: 2015-12-07 | Discharge: 2015-12-07 | Disposition: A | Discharge status: Home / Self Care | Payer: Self-pay | Source: Ambulatory Visit | Attending: Cardiovascular Disease | Admitting: Cardiovascular Disease

## 2015-12-09 ENCOUNTER — Encounter (HOSPITAL_COMMUNITY)
Admission: RE | Admit: 2015-12-09 | Discharge: 2015-12-09 | Disposition: A | Discharge status: Home / Self Care | Payer: Self-pay | Source: Ambulatory Visit | Attending: Cardiovascular Disease | Admitting: Cardiovascular Disease

## 2015-12-12 ENCOUNTER — Encounter (HOSPITAL_COMMUNITY): Payer: Self-pay

## 2015-12-14 ENCOUNTER — Encounter (HOSPITAL_COMMUNITY)
Admission: RE | Admit: 2015-12-14 | Discharge: 2015-12-14 | Disposition: A | Discharge status: Home / Self Care | Payer: Self-pay | Source: Ambulatory Visit | Attending: Cardiovascular Disease | Admitting: Cardiovascular Disease

## 2015-12-16 ENCOUNTER — Encounter (HOSPITAL_COMMUNITY): Payer: Self-pay

## 2015-12-19 ENCOUNTER — Encounter (HOSPITAL_COMMUNITY)
Admission: RE | Admit: 2015-12-19 | Discharge: 2015-12-19 | Disposition: A | Discharge status: Home / Self Care | Payer: Self-pay | Source: Ambulatory Visit | Attending: Cardiovascular Disease | Admitting: Cardiovascular Disease

## 2015-12-21 ENCOUNTER — Encounter (HOSPITAL_COMMUNITY)
Admission: RE | Admit: 2015-12-21 | Discharge: 2015-12-21 | Disposition: A | Discharge status: Home / Self Care | Payer: Self-pay | Source: Ambulatory Visit | Attending: Cardiovascular Disease | Admitting: Cardiovascular Disease

## 2015-12-21 DIAGNOSIS — Z955 Presence of coronary angioplasty implant and graft: Secondary | ICD-10-CM | POA: Insufficient documentation

## 2015-12-23 ENCOUNTER — Encounter (HOSPITAL_COMMUNITY): Payer: Self-pay

## 2015-12-26 ENCOUNTER — Encounter (HOSPITAL_COMMUNITY): Payer: Self-pay

## 2015-12-28 ENCOUNTER — Encounter (HOSPITAL_COMMUNITY): Payer: Self-pay

## 2015-12-30 ENCOUNTER — Encounter (HOSPITAL_COMMUNITY)
Admission: RE | Admit: 2015-12-30 | Discharge: 2015-12-30 | Disposition: A | Discharge status: Home / Self Care | Payer: Self-pay | Source: Ambulatory Visit | Attending: Cardiovascular Disease | Admitting: Cardiovascular Disease

## 2016-01-02 ENCOUNTER — Encounter (HOSPITAL_COMMUNITY)
Admission: RE | Admit: 2016-01-02 | Discharge: 2016-01-02 | Disposition: A | Discharge status: Home / Self Care | Payer: Self-pay | Source: Ambulatory Visit | Attending: Cardiovascular Disease | Admitting: Cardiovascular Disease

## 2016-01-04 ENCOUNTER — Encounter (HOSPITAL_COMMUNITY): Payer: Self-pay

## 2016-01-06 ENCOUNTER — Encounter (HOSPITAL_COMMUNITY): Payer: Self-pay

## 2016-01-09 ENCOUNTER — Encounter (HOSPITAL_COMMUNITY): Payer: Self-pay

## 2016-01-11 ENCOUNTER — Encounter (HOSPITAL_COMMUNITY): Payer: Self-pay

## 2016-01-16 ENCOUNTER — Encounter: Payer: Self-pay | Admitting: Physician Assistant

## 2016-01-16 ENCOUNTER — Ambulatory Visit (INDEPENDENT_AMBULATORY_CARE_PROVIDER_SITE_OTHER): Payer: 59 | Admitting: Physician Assistant

## 2016-01-16 ENCOUNTER — Encounter (HOSPITAL_COMMUNITY): Payer: Self-pay

## 2016-01-16 VITALS — BP 126/74 | HR 76 | Temp 100.0°F | Resp 18 | Ht 68.0 in | Wt 200.0 lb

## 2016-01-16 DIAGNOSIS — J0100 Acute maxillary sinusitis, unspecified: Secondary | ICD-10-CM | POA: Diagnosis not present

## 2016-01-16 MED ORDER — PREDNISONE 20 MG PO TABS: ORAL_TABLET | ORAL | 0 refills | Status: DC

## 2016-01-16 MED ORDER — AZITHROMYCIN 250 MG PO TABS: ORAL_TABLET | ORAL | 1 refills | Status: AC

## 2016-01-16 NOTE — Progress Notes (Signed)
Subjective:    Patient ID: Marvin Carter, male    DOB: 1955/06/18, 60 y.o.   MRN: [id number]  HPI 60 y.o. WM presents with sinus symptoms x 9 days, was getting better then got worse. Has cough at night/morning, sinus congestion, fever, chills, sinus congestion. No wheezing, SOB, CP.   Blood pressure 126/74, pulse 76, temperature 100 F (37.8 C), temperature source Temporal, resp. rate 18, height 5\' 8"  (1.727 m), weight 200 lb (90.7 kg), SpO2 98 %.  Medications Current Outpatient Prescriptions on File Prior to Visit  Medication Sig  . aspirin 81 MG tablet Take 81 mg by mouth daily.  Marland Kitchen atenolol (TENORMIN) 100 MG tablet Take 1 tablet by mouth  daily  . atorvastatin (LIPITOR) 40 MG tablet Take 1 tablet by mouth  daily for cholesterol  . b complex vitamins tablet Take 1 tablet by mouth daily.  . B-D 3CC LUER-LOK SYR 21GX1" 21G X 1" 3 ML MISC USE WITH TESTOSTERONE AS DIRECTED  . BENICAR 40 MG tablet Take 1 tablet by mouth  daily for blood pressure  . buPROPion (WELLBUTRIN XL) 300 MG 24 hr tablet Take 1 tablet by mouth  daily for mood  . clopidogrel (PLAVIX) 75 MG tablet Take 1 tablet by mouth  daily  . diltiazem (CARDIZEM CD) 240 MG 24 hr capsule Take 1 capsule by mouth  daily for blood pressure  . doxazosin (CARDURA) 8 MG tablet TAKE 1/2 TO 1 TABLET DAILY FOR BP & PROSTATE  . fenofibrate 160 MG tablet Take 1 tablet by mouth  daily  . fish oil-omega-3 fatty acids 1000 MG capsule Take 1 g by mouth daily. Takes 3 gm daily  . furosemide (LASIX) 80 MG tablet TAKE 1 TABLET BY MOUTH  DAILY FOR BLOOD PRESSURE  AND FLUID  . HYDROcodone-acetaminophen (NORCO) 10-325 MG per tablet   . Multiple Vitamin (MULTIVITAMIN WITH MINERALS) TABS Take 2 tablets by mouth daily.   Marland Kitchen NITROSTAT 0.4 MG SL tablet PLACE 1 TABLET (0.4 MG TOTAL) UNDER THE TONGUE EVERY 5 (FIVE) MINUTES AS NEEDED FOR CHEST PAIN.  Marland Kitchen pantoprazole (PROTONIX) 40 MG tablet Take 1 tablet by mouth  daily  . ranitidine (ZANTAC) 300 MG tablet Take  1 to 2 tablets daily to facilitate tapering off PPI's for heartburn/acid indigestion  . tadalafil (CIALIS) 20 MG tablet Take 20 mg by mouth daily as needed for erectile dysfunction.  Marland Kitchen testosterone cypionate (DEPOTESTOSTERONE CYPIONATE) 200 MG/ML injection INJECT 2 ML IM EVERY 12 DAYS   No current facility-administered medications on file prior to visit.     Problem list He has Prolapsed external hemorrhoids; Hemorrhoids, internal, with bleeding; NSVT (nonsustained ventricular tachycardia) ([place]); Atrial flutter, paroxysmal ([place]); CAD (coronary artery disease), native coronary artery; Stented coronary artery; S/P right knee arthroscopy; History of meniscectomy of right knee; Hypertension; Hyperlipidemia; GERD (gastroesophageal reflux disease); Anxiety; Diverticulosis; Prediabetes; Vitamin D deficiency; Medication management; Testosterone deficiency; BMI 30.0-30.9,adult; and Drug-induced erectile dysfunction on his problem list.  Review of Systems  Constitutional: Positive for fever. Negative for chills and diaphoresis.  HENT: Positive for congestion, sinus pain, sinus pressure, sneezing and sore throat. Negative for ear pain, postnasal drip, rhinorrhea, trouble swallowing and voice change.   Eyes: Negative.   Respiratory: Positive for cough. Negative for apnea, choking, chest tightness, shortness of breath, wheezing and stridor.   Cardiovascular: Negative.   Gastrointestinal: Negative.   Genitourinary: Negative.   Musculoskeletal: Negative for neck pain.  Neurological: Negative.  Negative for headaches.      Objective:  Physical Exam  Constitutional: He appears well-developed and well-nourished.  HENT:  Head: Normocephalic and atraumatic.  Right Ear: External ear normal.  Left Ear: External ear normal.  Nose: Right sinus exhibits maxillary sinus tenderness. Left sinus exhibits maxillary sinus tenderness.  Mouth/Throat: Oropharynx is clear and moist.  Eyes: Conjunctivae are normal.  Pupils are equal, round, and reactive to light.  Neck: Normal range of motion. Neck supple.  Cardiovascular: Normal rate, regular rhythm and normal heart sounds.   Pulmonary/Chest: Effort normal and breath sounds normal. He has no wheezes.  Abdominal: Soft. Bowel sounds are normal.  Lymphadenopathy:    He has no cervical adenopathy.  Skin: Skin is warm and dry.      Assessment & Plan:  1. Acute maxillary sinusitis, recurrence not specified - azithromycin (ZITHROMAX) 250 MG tablet; Take 2 tablets (500 mg) on  Day 1,  followed by 1 tablet (250 mg) once daily on Days 2 through 5.  Dispense: 6 each; Refill: 1 - predniSONE (DELTASONE) 20 MG tablet; 2 tablets daily for 3 days, 1 tablet daily for 4 days.  Dispense: 10 tablet; Refill: 0

## 2016-01-16 NOTE — Patient Instructions (Signed)
Please take the prednisone to help decrease inflammation and therefore decrease symptoms. Take it it with food to avoid GI upset. It can cause increased energy but on the other hand it can make it hard to sleep at night so please take it AT [place], it takes 8-12 hours to start working so it will NOT affect your sleeping if you take it at night with your food!!  If you are diabetic it will increase your sugars so decrease carbs and monitor your sugars closely.    You do not have to finish it, this is not an antibiotic.   HOW TO TREAT VIRAL COUGH AND COLD SYMPTOMS:  -Symptoms usually last at least 1 week with the worst symptoms being around day 4.  - colds usually start with a sore throat and end with a cough, and the cough can take 2 weeks to get better.  -No antibiotics are needed for colds, flu, sore throats, cough, bronchitis UNLESS symptoms are longer than 7 days OR if you are getting better then get drastically worse.  -There are a lot of combination medications (Dayquil, Nyquil, Vicks 44, tyelnol cold and sinus, ETC). Please look at the ingredients on the back so that you are treating the correct symptoms and not doubling up on medications/ingredients.    Medicines you can use  Nasal congestion  - pseudoephedrine (Sudafed)- behind the counter, do not use if you have high blood pressure, medicine that have -D in them.  - phenylephrine (Sudafed PE) -Dextormethorphan + chlorpheniramine (Coridcidin HBP)- okay if you have high blood pressure -Oxymetazoline (Afrin) nasal spray- LIMIT to 3 days -Saline nasal spray -Neti pot (used distilled or bottled water)  Ear pain/congestion  -pseudoephedrine (sudafed) - Nasonex/flonase nasal spray  Fever  -Acetaminophen (Tyelnol) -Ibuprofen (Advil, motrin, aleve)  Sore Throat  -Acetaminophen (Tyelnol) -Ibuprofen (Advil, motrin, aleve) -Drink a lot of water -Gargle with salt water - Rest your voice (don't talk) -Throat sprays -Cough  drops  Body Aches  -Acetaminophen (Tyelnol) -Ibuprofen (Advil, motrin, aleve)  Headache  -Acetaminophen (Tyelnol) -Ibuprofen (Advil, motrin, aleve) - Exedrin, Exedrin Migraine  Allergy symptoms (cough, sneeze, runny nose, itchy eyes) -Claritin or loratadine cheapest but likely the weakest  -Zyrtec or certizine at night because it can make you sleepy -The strongest is allegra or fexafinadine  Cheapest at [employer], [employer], [employer]  Cough  -Dextromethorphan (Delsym)- medicine that has DM in it -Guafenesin (Mucinex/Robitussin) - cough drops - drink lots of water  Chest Congestion  -Guafenesin (Mucinex/Robitussin)  Red Itchy Eyes  - Naphcon-A  Upset Stomach  - Bland diet (nothing spicy, greasy, fried, and high acid foods like tomatoes, oranges, berries) -OKAY- cereal, bread, soup, crackers, rice -Eat smaller more frequent meals -reduce caffeine, no alcohol -Loperamide (Imodium-AD) if diarrhea -Prevacid for heart burn  General health when sick  -Hydration -wash your hands frequently -keep surfaces clean -change pillow cases and sheets often -Get fresh air but do not exercise strenuously -Vitamin D, double up on it - Vitamin C -Zinc

## 2016-01-17 ENCOUNTER — Other Ambulatory Visit: Payer: Self-pay | Admitting: Internal Medicine

## 2016-01-18 ENCOUNTER — Encounter (HOSPITAL_COMMUNITY): Payer: Self-pay

## 2016-01-22 ENCOUNTER — Other Ambulatory Visit: Payer: Self-pay | Admitting: Internal Medicine

## 2016-01-23 ENCOUNTER — Telehealth: Payer: Self-pay | Admitting: Cardiovascular Disease

## 2016-01-23 ENCOUNTER — Encounter (HOSPITAL_COMMUNITY): Payer: Self-pay

## 2016-01-23 DIAGNOSIS — Z955 Presence of coronary angioplasty implant and graft: Secondary | ICD-10-CM | POA: Insufficient documentation

## 2016-01-23 NOTE — Telephone Encounter (Signed)
Continuance of participation in Cardiac Rehab Maintenance Program.  Signed and faxed to number provided.

## 2016-01-24 ENCOUNTER — Telehealth: Payer: Self-pay | Admitting: Physician Assistant

## 2016-01-24 ENCOUNTER — Other Ambulatory Visit: Payer: Self-pay | Admitting: Physician Assistant

## 2016-01-24 ENCOUNTER — Encounter: Payer: Self-pay | Admitting: Physician Assistant

## 2016-01-24 DIAGNOSIS — J0100 Acute maxillary sinusitis, unspecified: Secondary | ICD-10-CM

## 2016-01-24 MED ORDER — LEVOFLOXACIN 500 MG PO TABS: 500.0000 mg | ORAL_TABLET | Freq: Every day | ORAL | 0 refills | Status: DC

## 2016-01-24 MED ORDER — PREDNISONE 20 MG PO TABS: ORAL_TABLET | ORAL | 0 refills | Status: DC

## 2016-01-24 NOTE — Telephone Encounter (Signed)
Patient was seen 11/27 for sinus infection, completed zpak and prednisone, states was doing better then got worse. Will send in levaquin and prednisone pack, needs to stay on allergy pill and OTC meds.

## 2016-01-24 NOTE — Telephone Encounter (Signed)
INFORMED PT OF RX THAT WAS SENT & TO FOLLOW UP w/ our office if he is not better or go to ER if he gets worse.. Pt agreed & voiced understanding.

## 2016-01-25 ENCOUNTER — Other Ambulatory Visit: Payer: Self-pay | Admitting: Physician Assistant

## 2016-01-25 ENCOUNTER — Encounter (HOSPITAL_COMMUNITY)
Admission: RE | Admit: 2016-01-25 | Discharge: 2016-01-25 | Disposition: A | Discharge status: Home / Self Care | Payer: Self-pay | Source: Ambulatory Visit | Attending: Cardiovascular Disease | Admitting: Cardiovascular Disease

## 2016-01-25 MED ORDER — AMOXICILLIN-POT CLAVULANATE 875-125 MG PO TABS: 1.0000 | ORAL_TABLET | Freq: Two times a day (BID) | ORAL | 0 refills | Status: DC

## 2016-01-27 ENCOUNTER — Encounter (HOSPITAL_COMMUNITY): Payer: Self-pay

## 2016-01-29 ENCOUNTER — Other Ambulatory Visit: Payer: Self-pay | Admitting: Internal Medicine

## 2016-01-30 ENCOUNTER — Encounter (HOSPITAL_COMMUNITY)
Admission: RE | Admit: 2016-01-30 | Discharge: 2016-01-30 | Disposition: A | Discharge status: Home / Self Care | Payer: Self-pay | Source: Ambulatory Visit | Attending: Cardiovascular Disease | Admitting: Cardiovascular Disease

## 2016-02-01 ENCOUNTER — Encounter (HOSPITAL_COMMUNITY): Payer: Self-pay

## 2016-02-03 ENCOUNTER — Encounter (HOSPITAL_COMMUNITY): Payer: Self-pay

## 2016-02-06 ENCOUNTER — Encounter (HOSPITAL_COMMUNITY)
Admission: RE | Admit: 2016-02-06 | Discharge: 2016-02-06 | Disposition: A | Discharge status: Home / Self Care | Payer: Self-pay | Source: Ambulatory Visit | Attending: Cardiovascular Disease | Admitting: Cardiovascular Disease

## 2016-02-08 ENCOUNTER — Encounter (HOSPITAL_COMMUNITY)
Admission: RE | Admit: 2016-02-08 | Discharge: 2016-02-08 | Disposition: A | Discharge status: Home / Self Care | Payer: Self-pay | Source: Ambulatory Visit | Attending: Cardiovascular Disease | Admitting: Cardiovascular Disease

## 2016-02-08 ENCOUNTER — Telehealth: Payer: Self-pay | Admitting: Cardiovascular Disease

## 2016-02-08 NOTE — Telephone Encounter (Signed)
Order for continuation of participation in Cardiac Rehab.  Signed by Dr. Gwenlyn Found and faxed back to number provided.

## 2016-02-10 ENCOUNTER — Encounter (HOSPITAL_COMMUNITY): Payer: Self-pay

## 2016-02-10 ENCOUNTER — Encounter: Payer: Self-pay | Admitting: Internal Medicine

## 2016-02-10 ENCOUNTER — Ambulatory Visit: Payer: 59 | Admitting: Internal Medicine

## 2016-02-10 ENCOUNTER — Other Ambulatory Visit: Payer: Self-pay | Admitting: *Deleted

## 2016-02-10 VITALS — BP 112/76 | HR 56 | Temp 98.1°F | Resp 16 | Ht 67.5 in | Wt 201.2 lb

## 2016-02-10 DIAGNOSIS — R7303 Prediabetes: Secondary | ICD-10-CM

## 2016-02-10 DIAGNOSIS — Z79899 Other long term (current) drug therapy: Secondary | ICD-10-CM

## 2016-02-10 DIAGNOSIS — E559 Vitamin D deficiency, unspecified: Secondary | ICD-10-CM

## 2016-02-10 DIAGNOSIS — Z0001 Encounter for general adult medical examination with abnormal findings: Secondary | ICD-10-CM

## 2016-02-10 DIAGNOSIS — E349 Endocrine disorder, unspecified: Secondary | ICD-10-CM

## 2016-02-10 DIAGNOSIS — I2510 Atherosclerotic heart disease of native coronary artery without angina pectoris: Secondary | ICD-10-CM

## 2016-02-10 DIAGNOSIS — E782 Mixed hyperlipidemia: Secondary | ICD-10-CM

## 2016-02-10 DIAGNOSIS — Z1212 Encounter for screening for malignant neoplasm of rectum: Secondary | ICD-10-CM

## 2016-02-10 DIAGNOSIS — Z125 Encounter for screening for malignant neoplasm of prostate: Secondary | ICD-10-CM

## 2016-02-10 DIAGNOSIS — Z111 Encounter for screening for respiratory tuberculosis: Secondary | ICD-10-CM

## 2016-02-10 DIAGNOSIS — R5383 Other fatigue: Secondary | ICD-10-CM

## 2016-02-10 DIAGNOSIS — Z136 Encounter for screening for cardiovascular disorders: Secondary | ICD-10-CM

## 2016-02-10 DIAGNOSIS — I10 Essential (primary) hypertension: Secondary | ICD-10-CM

## 2016-02-10 LAB — CBC WITH DIFFERENTIAL/PLATELET
BASOS PCT: 0 %
Basophils Absolute: 0 cells/uL (ref 0–200)
EOS ABS: 180 {cells}/uL (ref 15–500)
Eosinophils Relative: 4 %
HEMATOCRIT: 46.3 % (ref 38.5–50.0)
HEMOGLOBIN: 15.5 g/dL (ref 13.2–17.1)
LYMPHS ABS: 1485 {cells}/uL (ref 850–3900)
LYMPHS PCT: 33 %
MCH: 33 pg (ref 27.0–33.0)
MCHC: 33.5 g/dL (ref 32.0–36.0)
MCV: 98.5 fL (ref 80.0–100.0)
MONO ABS: 630 {cells}/uL (ref 200–950)
MPV: 10.1 fL (ref 7.5–12.5)
Monocytes Relative: 14 %
NEUTROS PCT: 49 %
Neutro Abs: 2205 cells/uL (ref 1500–7800)
Platelets: 172 10*3/uL (ref 140–400)
RBC: 4.7 MIL/uL (ref 4.20–5.80)
RDW: 13 % (ref 11.0–15.0)
WBC: 4.5 10*3/uL (ref 3.8–10.8)

## 2016-02-10 LAB — HEMOGLOBIN A1C
HEMOGLOBIN A1C: 5.1 % (ref <5.7)
MEAN PLASMA GLUCOSE: 100 mg/dL

## 2016-02-10 LAB — VITAMIN B12: Vitamin B-12: >2000 pg/mL — ABNORMAL HIGH (ref 200–1100)

## 2016-02-10 LAB — PSA: PSA: 1 ng/mL (ref <=4.0)

## 2016-02-10 LAB — TSH: TSH: 0.86 m[IU]/L (ref 0.40–4.50)

## 2016-02-10 MED ORDER — FENOFIBRATE 160 MG PO TABS: 160.0000 mg | ORAL_TABLET | Freq: Every day | ORAL | 3 refills | Status: DC

## 2016-02-10 MED ORDER — ATORVASTATIN CALCIUM 40 MG PO TABS: ORAL_TABLET | ORAL | 3 refills | Status: DC

## 2016-02-10 MED ORDER — DOXAZOSIN MESYLATE 8 MG PO TABS: ORAL_TABLET | ORAL | 3 refills | Status: DC

## 2016-02-10 MED ORDER — CLOPIDOGREL BISULFATE 75 MG PO TABS: 75.0000 mg | ORAL_TABLET | Freq: Every day | ORAL | 3 refills | Status: DC

## 2016-02-10 MED ORDER — ATENOLOL 100 MG PO TABS
100.0000 mg | ORAL_TABLET | Freq: Every day | ORAL | 3 refills | Status: DC
Start: 2016-02-10 — End: 2017-03-21

## 2016-02-10 MED ORDER — MELOXICAM 15 MG PO TABS: ORAL_TABLET | ORAL | 1 refills | Status: DC

## 2016-02-10 MED ORDER — OLMESARTAN MEDOXOMIL 40 MG PO TABS: 40.0000 mg | ORAL_TABLET | Freq: Every day | ORAL | 3 refills | Status: DC

## 2016-02-10 MED ORDER — DILTIAZEM HCL ER COATED BEADS 240 MG PO CP24: ORAL_CAPSULE | ORAL | 3 refills | Status: DC

## 2016-02-10 MED ORDER — FUROSEMIDE 80 MG PO TABS: ORAL_TABLET | ORAL | 3 refills | Status: DC

## 2016-02-10 MED ORDER — BUPROPION HCL ER (XL) 300 MG PO TB24: ORAL_TABLET | ORAL | 3 refills | Status: DC

## 2016-02-10 NOTE — Progress Notes (Signed)
[facility] Unk Pinto, M.D.    Marvin Carter, P.A.-C      Starlyn Skeans, P.A.-C  [facility]                [address] [place], [place] [id number] Telephone [phone] Telefax [phone]  Annual Screening/Preventative Visit & Comprehensive Evaluation &  Examination     This very nice 60 y.o. MWM presents for a Screening/Preventative Visit & comprehensive evaluation and management of multiple medical co-morbidities.  Patient has been followed for HTN,  ASCADPrediabetes, Hyperlipidemia and Vitamin D Deficiency. Patient a;lso has GERD and failed attempt to wean/transition to H2 blockers.       HTN predates since 1989.  He had a Negative Heart Cath in 2009, but in 2012 presented with ACS and had a PCA/DES. Patient continues in cardiac rehab and is doing well. Other problems include Stage 3 CKD (GFR 58). Patient's BP has been controlled at home and patient denies any cardiac symptoms as chest pain, palpitations, shortness of breath, dizziness or ankle swelling. Today's BP is at goal - 112/76.      Patient's hyperlipidemia is controlled with diet and medications. Patient denies myalgias or other medication SE's. Last lipids were at goal: Lab Results  Component Value Date   CHOL 153 10/12/2015   HDL 52 10/12/2015   LDLCALC 75 10/12/2015   TRIG 129 10/12/2015   CHOLHDL 2.9 10/12/2015      Patient has prediabetes circa 2008 with A1c  5.7%  and patient denies reactive hypoglycemic symptoms, visual blurring, diabetic polys, or paresthesias. Last A1c was at goal: Lab Results  Component Value Date   HGBA1C 5.0 10/12/2015      Finally, patient has history of Vitamin D Deficiency in 2009 of "19" and last Vitamin D was at goal:  Lab Results  Component Value Date   VD25OH 81 10/12/2015   Medication Sig  . aspirin 81 MG tablet Take 81 mg by mouth daily.  Marland Kitchen b complex vitamins tablet Take 1 tablet  by mouth daily.  . fish oil-omega-3 1000 MG capsule Take 1 g by mouth daily. Takes 3 gm daily  . NORCO 10-325 MG per tablet  per Dr Noemi Chapel  . Multi-Vit w/ Min  Take 2 tablets by mouth daily.   Marland Kitchen NITROSTAT 0.4 MG SL tablet  AS NEEDED FOR CHEST PAIN.  Marland Kitchen testosterone cypio 200 mg inj INJECT 2 ML IM EVERY 12 DAYS  . atenolol  100 MG tablet TAKE 1 TABLET BY MOUTH  DAILY  . atorvastatin  40 MG tablet TAKE 1 TABLET BY MOUTH  DAILY FOR CHOLESTEROL  . BENICAR 40 MG tablet Take 1 tablet by mouth  daily for blood pressure  . buPROPion-XL 300 MG 24 hr tablet Take 1 tablet by mouth  daily for mood  . clopidogrel  75 MG tablet Take 1 tablet by mouth  daily  . diltiazem  CD 240 MG 24 hr capsule TAKE 1 CAPSULE BY MOUTH  DAILY FOR BLOOD PRESSURE  . Doxazosin 8 MG tablet TAKE 1/2 TO 1 TABLET DAILY FOR BP & PROSTATE  . fenofibrate 160 MG tablet TAKE 1 TABLET BY MOUTH  DAILY  . furosemide  80 MG tablet TAKE 1 TAB  DAILY   . meloxicam  15 MG tablet TAKE 1 TAB DAILY   . pantoprazole  40 MG tablet Take 1  tab daily  . tadalafil  20 MG tablet Take 20 mg by mouth daily as needed    Allergies  Allergen Reactions  . Hctz [Hydrochlorothiazide]   . Tape Rash    Clear tape   Past Medical History:  Diagnosis Date  . Anxiety   . Arthritis   . Atrial flutter, paroxysmal ([place]) 01/04/2011  . CAD (coronary artery disease), native coronary artery 01/04/2011  . Diverticulitis   . Diverticulosis   . GERD (gastroesophageal reflux disease)   . Hemarthrosis of knee, right 01/04/2011  . Hemorrhoids   . History of meniscectomy of right knee 01/02/2011  . Hyperlipemia 01/04/2011  . Hyperlipidemia   . Hypertension   . Meniscus tear 01/02/2011  . NSVT (nonsustained ventricular tachycardia) ([place]) 01/02/2011  . Right knee pain 01/02/2011  . Stented coronary artery 01/04/2011   LAD DES Resolute.  . Torn medial meniscus    right knee   Health Maintenance  Topic Date Due  . ZOSTAVAX  01/25/2015  . COLONOSCOPY   10/25/2020  . TETANUS/TDAP  02/28/2025  . INFLUENZA VACCINE  Completed  . Hepatitis C Screening  Completed  . HIV Screening  Completed   Immunization History  Administered Date(s) Administered  . Influenza Split 12/16/2013, 12/02/2014  . Influenza Whole 12/09/2012  . Influenza, Seasonal, Injecte, Preservative Fre 12/01/2015  . PPD Test 11/05/2013, 01/04/2015  . Td 02/19/2005  . Tdap 03/01/2015   Past Surgical History:  Procedure Laterality Date  . APPENDECTOMY    . bicept tendon repair     bilateral  . CORONARY ANGIOPLASTY WITH STENT PLACEMENT  2012  . HAND SURGERY     right  . KNEE ARTHROSCOPY     x 8 left  . KNEE ARTHROSCOPY Right 04/21/2012   Procedure: ARTHROSCOPY KNEE;  Surgeon: Lorn Junes, MD;  Location: [facility];  Service: Orthopedics;  Laterality: Right;  medial and lateral meniscectomies  . LEFT HEART CATHETERIZATION WITH CORONARY ANGIOGRAM N/A 01/03/2011   Procedure: LEFT HEART CATHETERIZATION WITH CORONARY ANGIOGRAM;  Surgeon: Lorretta Harp, MD;  Location: [facility] CATH LAB;  Service: Cardiovascular;  Laterality: N/A;  . PERCUTANEOUS CORONARY STENT INTERVENTION (PCI-S) N/A 01/03/2011   Procedure: PERCUTANEOUS CORONARY STENT INTERVENTION (PCI-S);  Surgeon: Lorretta Harp, MD;  Location: [facility] CATH LAB;  Service: Cardiovascular;  Laterality: N/A;  . ROTATOR CUFF REPAIR     bilateral  . TONSILLECTOMY     Family History  Problem Relation Age of Onset  . Diabetes Mother   . Heart disease Mother   . Heart attack Mother   . Heart disease Father   . Coronary artery disease Father   . Obesity Brother   . Diabetes Brother   . HIV/AIDS Brother   . Hypertension Brother    Social History  Substance Use Topics  . Smoking status: Never Smoker  . Smokeless tobacco: Never Used  . Alcohol use 6.0 oz/week    10 Cans of beer per week     Comment: 1-2 beers a night    ROS Constitutional: Denies fever, chills, weight loss/gain, headaches, insomnia,  night sweats, and change  in appetite. Does c/o fatigue. Eyes: Denies redness, blurred vision, diplopia, discharge, itchy, watery eyes.  ENT: Denies discharge, congestion, post nasal drip, epistaxis, sore throat, earache, hearing loss, dental pain, Tinnitus, Vertigo, Sinus pain, snoring.  Cardio: Denies chest pain, palpitations, irregular heartbeat, syncope, dyspnea, diaphoresis, orthopnea, PND, claudication, edema Respiratory: denies cough, dyspnea, DOE, pleurisy, hoarseness, laryngitis, wheezing.  Gastrointestinal: Denies dysphagia, heartburn, reflux,  water brash, pain, cramps, nausea, vomiting, bloating, diarrhea, constipation, hematemesis, melena, hematochezia, jaundice, hemorrhoids Genitourinary: Denies dysuria, frequency, urgency, nocturia, hesitancy, discharge, hematuria, flank pain Breast: Breast lumps, nipple discharge, bleeding.  Musculoskeletal: Denies arthralgia, myalgia, stiffness, Jt. Swelling, pain, limp, and strain/sprain. Denies falls. Skin: Denies puritis, rash, hives, warts, acne, eczema, changing in skin lesion Neuro: No weakness, tremor, incoordination, spasms, paresthesia, pain Psychiatric: Denies confusion, memory loss, sensory loss. Denies Depression. Endocrine: Denies change in weight, skin, hair change, nocturia, and paresthesia, diabetic polys, visual blurring, hyper / hypo glycemic episodes.  Heme/Lymph: No excessive bleeding, bruising, enlarged lymph nodes.  Physical Exam  BP 112/76   Pulse (!) 56   Temp 98.1 F (36.7 C)   Resp 16   Ht 5' 7.5" (1.715 m)   Wt 201 lb 3.2 oz (91.3 kg)   BMI 31.05 kg/m   General Appearance: Well nourished and in no apparent distress.  Eyes: PERRLA, EOMs, conjunctiva no swelling or erythema, normal fundi and vessels. Sinuses: No frontal/maxillary tenderness ENT/Mouth: EACs patent / TMs  nl. Nares clear without erythema, swelling, mucoid exudates. Oral hygiene is good. No erythema, swelling, or exudate. Tongue normal, non-obstructing. Tonsils not swollen  or erythematous. Hearing normal.  Neck: Supple, thyroid normal. No bruits, nodes or JVD. Respiratory: Respiratory effort normal.  BS equal and clear bilateral without rales, rhonci, wheezing or stridor. Cardio: Heart sounds are normal with regular rate and rhythm and no murmurs, rubs or gallops. Peripheral pulses are normal and equal bilaterally without edema. No aortic or femoral bruits. Chest: symmetric with normal excursions and percussion. Breasts: Symmetric, without lumps, nipple discharge, retractions, or fibrocystic changes.  Abdomen: Flat, soft with bowel sounds active. Nontender, no guarding, rebound, hernias, masses, or organomegaly.  Lymphatics: Non tender without lymphadenopathy.  Genitourinary:  Musculoskeletal: Full ROM all peripheral extremities, joint stability, 5/5 strength, and normal gait. Skin: Warm and dry without rashes, lesions, cyanosis, clubbing or  ecchymosis.  Neuro: Cranial nerves intact, reflexes equal bilaterally. Normal muscle tone, no cerebellar symptoms. Sensation intact.  Pysch: Alert and oriented X 3, normal affect, Insight and Judgment appropriate.   Assessment and Plan  1. Annual Preventative Screening Examination   2. Essential hypertension  - Microalbumin / creatinine urine ratio - EKG 12-Lead - [place], RETROPERITNL ABD,  LTD - Urinalysis, Routine w reflex microscopic - CBC with Differential/Platelet - BASIC METABOLIC PANEL WITH GFR - TSH  3. Mixed hyperlipidemia  - EKG 12-Lead - [place], RETROPERITNL ABD,  LTD - Hepatic function panel - Lipid panel - TSH  4. Prediabetes  - EKG 12-Lead - [place], RETROPERITNL ABD,  LTD - Hemoglobin A1c - Insulin, random  5. Vitamin D deficiency  - VITAMIN D 25 Hydroxy   6. Testosterone deficiency  - Testosterone  7. Screening for rectal cancer  - POC Hemoccult Bld/Stl   8. Prostate cancer screening  - PSA  9. Screening for ischemic heart disease  - EKG 12-Lead  10. Screening for AAA (aortic  abdominal aneurysm)  - [place], RETROPERITNL ABD,  LTD  11. Other fatigue  - Vitamin B12 - Iron and TIBC  12. Medication management  - Urinalysis, Routine w reflex microscopic - CBC with Differential/Platelet - BASIC METABOLIC PANEL WITH GFR - Hepatic function panel - Magnesium  13. Screening examination for pulmonary tuberculosis  - PPD      Continue prudent diet as discussed, weight control, BP monitoring, regular exercise, and medications. Discussed med's effects and SE's. Screening labs and tests as requested with regular follow-up as  recommended. Over 40 minutes of exam, counseling, chart review and high complex critical decision making was performed.

## 2016-02-10 NOTE — Patient Instructions (Signed)

## 2016-02-11 LAB — BASIC METABOLIC PANEL WITH GFR
BUN: 21 mg/dL (ref 7–25)
CO2: 19 mmol/L — AB (ref 20–31)
Calcium: 9.4 mg/dL (ref 8.6–10.3)
Chloride: 102 mmol/L (ref 98–110)
Creat: 1.2 mg/dL (ref 0.70–1.25)
GFR, EST NON AFRICAN AMERICAN: 65 mL/min (ref >=60)
GFR, Est African American: 75 mL/min (ref >=60)
Glucose, Bld: 98 mg/dL (ref 65–99)
POTASSIUM: 3.8 mmol/L (ref 3.5–5.3)
Sodium: 140 mmol/L (ref 135–146)

## 2016-02-11 LAB — URINALYSIS, ROUTINE W REFLEX MICROSCOPIC
Bilirubin Urine: NEGATIVE
Glucose, UA: NEGATIVE
Hgb urine dipstick: NEGATIVE
Ketones, ur: NEGATIVE
LEUKOCYTES UA: NEGATIVE
NITRITE: NEGATIVE
PROTEIN: NEGATIVE
SPECIFIC GRAVITY, URINE: 1.007 (ref 1.001–1.035)
pH: 7 (ref 5.0–8.0)

## 2016-02-11 LAB — LIPID PANEL
Cholesterol: 162 mg/dL (ref <200)
HDL: 62 mg/dL (ref >40)
LDL CALC: 86 mg/dL (ref <100)
Total CHOL/HDL Ratio: 2.6 Ratio (ref <5.0)
Triglycerides: 72 mg/dL (ref <150)
VLDL: 14 mg/dL (ref <30)

## 2016-02-11 LAB — HEPATIC FUNCTION PANEL
ALBUMIN: 4.5 g/dL (ref 3.6–5.1)
ALT: 29 U/L (ref 9–46)
AST: 37 U/L — AB (ref 10–35)
Alkaline Phosphatase: 43 U/L (ref 40–115)
Bilirubin, Direct: 0.2 mg/dL (ref <=0.2)
Indirect Bilirubin: 0.5 mg/dL (ref 0.2–1.2)
TOTAL PROTEIN: 7 g/dL (ref 6.1–8.1)
Total Bilirubin: 0.7 mg/dL (ref 0.2–1.2)

## 2016-02-11 LAB — IRON AND TIBC
%SAT: 48 % (ref 15–60)
IRON: 158 ug/dL (ref 50–180)
TIBC: 326 ug/dL (ref 250–425)
UIBC: 168 ug/dL (ref 125–400)

## 2016-02-11 LAB — TESTOSTERONE: Testosterone: 654 ng/dL (ref 250–827)

## 2016-02-11 LAB — MICROALBUMIN / CREATININE URINE RATIO
CREATININE, URINE: 47 mg/dL (ref 20–370)
Microalb, Ur: <0.2 mg/dL

## 2016-02-11 LAB — MAGNESIUM: Magnesium: 1.6 mg/dL (ref 1.5–2.5)

## 2016-02-11 LAB — INSULIN, RANDOM: INSULIN: 4.2 u[IU]/mL (ref 2.0–19.6)

## 2016-02-11 LAB — VITAMIN D 25 HYDROXY (VIT D DEFICIENCY, FRACTURES): Vit D, 25-Hydroxy: 73 ng/mL (ref 30–100)

## 2016-02-15 ENCOUNTER — Encounter (HOSPITAL_COMMUNITY): Payer: Self-pay

## 2016-02-17 ENCOUNTER — Encounter (HOSPITAL_COMMUNITY): Payer: Self-pay

## 2016-02-22 ENCOUNTER — Encounter (HOSPITAL_COMMUNITY)
Admission: RE | Admit: 2016-02-22 | Discharge: 2016-02-22 | Disposition: A | Discharge status: Home / Self Care | Payer: Self-pay | Source: Ambulatory Visit | Attending: Cardiovascular Disease | Admitting: Cardiovascular Disease

## 2016-02-22 DIAGNOSIS — Z955 Presence of coronary angioplasty implant and graft: Secondary | ICD-10-CM | POA: Insufficient documentation

## 2016-02-24 ENCOUNTER — Encounter (HOSPITAL_COMMUNITY)
Admission: RE | Admit: 2016-02-24 | Discharge: 2016-02-24 | Disposition: A | Discharge status: Home / Self Care | Payer: Self-pay | Source: Ambulatory Visit | Attending: Cardiovascular Disease | Admitting: Cardiovascular Disease

## 2016-02-27 ENCOUNTER — Encounter (HOSPITAL_COMMUNITY)
Admission: RE | Admit: 2016-02-27 | Discharge: 2016-02-27 | Disposition: A | Discharge status: Home / Self Care | Payer: Self-pay | Source: Ambulatory Visit | Attending: Cardiovascular Disease | Admitting: Cardiovascular Disease

## 2016-02-29 ENCOUNTER — Encounter (HOSPITAL_COMMUNITY)
Admission: RE | Admit: 2016-02-29 | Discharge: 2016-02-29 | Disposition: A | Discharge status: Home / Self Care | Payer: Self-pay | Source: Ambulatory Visit | Attending: Cardiovascular Disease | Admitting: Cardiovascular Disease

## 2016-03-02 ENCOUNTER — Encounter (HOSPITAL_COMMUNITY)
Admission: RE | Admit: 2016-03-02 | Discharge: 2016-03-02 | Disposition: A | Discharge status: Home / Self Care | Payer: Self-pay | Source: Ambulatory Visit | Attending: Cardiovascular Disease | Admitting: Cardiovascular Disease

## 2016-03-05 ENCOUNTER — Encounter (HOSPITAL_COMMUNITY)
Admission: RE | Admit: 2016-03-05 | Discharge: 2016-03-05 | Disposition: A | Discharge status: Home / Self Care | Payer: Self-pay | Source: Ambulatory Visit | Attending: Cardiovascular Disease | Admitting: Cardiovascular Disease

## 2016-03-07 ENCOUNTER — Encounter (HOSPITAL_COMMUNITY): Payer: Self-pay

## 2016-03-08 ENCOUNTER — Other Ambulatory Visit: Payer: Self-pay | Admitting: Internal Medicine

## 2016-03-09 ENCOUNTER — Encounter (HOSPITAL_COMMUNITY)
Admission: RE | Admit: 2016-03-09 | Discharge: 2016-03-09 | Disposition: A | Discharge status: Home / Self Care | Payer: Self-pay | Source: Ambulatory Visit | Attending: Cardiovascular Disease | Admitting: Cardiovascular Disease

## 2016-03-12 ENCOUNTER — Encounter (HOSPITAL_COMMUNITY)
Admission: RE | Admit: 2016-03-12 | Discharge: 2016-03-12 | Disposition: A | Discharge status: Home / Self Care | Payer: Self-pay | Source: Ambulatory Visit | Attending: Cardiovascular Disease | Admitting: Cardiovascular Disease

## 2016-03-14 ENCOUNTER — Encounter (HOSPITAL_COMMUNITY)
Admission: RE | Admit: 2016-03-14 | Discharge: 2016-03-14 | Disposition: A | Discharge status: Home / Self Care | Payer: Self-pay | Source: Ambulatory Visit | Attending: Cardiovascular Disease | Admitting: Cardiovascular Disease

## 2016-03-16 ENCOUNTER — Encounter (HOSPITAL_COMMUNITY)
Admission: RE | Admit: 2016-03-16 | Discharge: 2016-03-16 | Disposition: A | Discharge status: Home / Self Care | Payer: Self-pay | Source: Ambulatory Visit | Attending: Cardiovascular Disease | Admitting: Cardiovascular Disease

## 2016-03-19 ENCOUNTER — Encounter (HOSPITAL_COMMUNITY): Payer: Self-pay

## 2016-03-21 ENCOUNTER — Telehealth: Payer: Self-pay | Admitting: *Deleted

## 2016-03-21 ENCOUNTER — Encounter (HOSPITAL_COMMUNITY)
Admission: RE | Admit: 2016-03-21 | Discharge: 2016-03-21 | Disposition: A | Discharge status: Home / Self Care | Payer: Self-pay | Source: Ambulatory Visit | Attending: Cardiovascular Disease | Admitting: Cardiovascular Disease

## 2016-03-21 NOTE — Telephone Encounter (Signed)
Left message to advise [facility] the patient's Testosterone Cypionate has been approved.

## 2016-03-21 NOTE — Telephone Encounter (Signed)
error 

## 2016-03-23 ENCOUNTER — Encounter (HOSPITAL_COMMUNITY)
Admission: RE | Admit: 2016-03-23 | Discharge: 2016-03-23 | Disposition: A | Discharge status: Home / Self Care | Payer: Self-pay | Source: Ambulatory Visit | Attending: Cardiovascular Disease | Admitting: Cardiovascular Disease

## 2016-03-23 DIAGNOSIS — Z955 Presence of coronary angioplasty implant and graft: Secondary | ICD-10-CM | POA: Insufficient documentation

## 2016-03-26 ENCOUNTER — Encounter (HOSPITAL_COMMUNITY): Payer: Self-pay

## 2016-03-28 ENCOUNTER — Encounter (HOSPITAL_COMMUNITY): Payer: Self-pay

## 2016-03-30 ENCOUNTER — Encounter (HOSPITAL_COMMUNITY): Payer: Self-pay

## 2016-04-02 ENCOUNTER — Encounter (HOSPITAL_COMMUNITY)
Admission: RE | Admit: 2016-04-02 | Discharge: 2016-04-02 | Disposition: A | Discharge status: Home / Self Care | Payer: Self-pay | Source: Ambulatory Visit | Attending: Cardiovascular Disease | Admitting: Cardiovascular Disease

## 2016-04-04 ENCOUNTER — Encounter (HOSPITAL_COMMUNITY)
Admission: RE | Admit: 2016-04-04 | Discharge: 2016-04-04 | Disposition: A | Discharge status: Home / Self Care | Payer: Self-pay | Source: Ambulatory Visit | Attending: Cardiovascular Disease | Admitting: Cardiovascular Disease

## 2016-04-06 ENCOUNTER — Encounter (HOSPITAL_COMMUNITY)
Admission: RE | Admit: 2016-04-06 | Discharge: 2016-04-06 | Disposition: A | Discharge status: Home / Self Care | Payer: Self-pay | Source: Ambulatory Visit | Attending: Cardiovascular Disease | Admitting: Cardiovascular Disease

## 2016-04-09 ENCOUNTER — Encounter (HOSPITAL_COMMUNITY)
Admission: RE | Admit: 2016-04-09 | Discharge: 2016-04-09 | Disposition: A | Discharge status: Home / Self Care | Payer: Self-pay | Source: Ambulatory Visit | Attending: Cardiovascular Disease | Admitting: Cardiovascular Disease

## 2016-04-11 ENCOUNTER — Encounter (HOSPITAL_COMMUNITY)
Admission: RE | Admit: 2016-04-11 | Discharge: 2016-04-11 | Disposition: A | Discharge status: Home / Self Care | Payer: Self-pay | Source: Ambulatory Visit | Attending: Cardiovascular Disease | Admitting: Cardiovascular Disease

## 2016-04-13 ENCOUNTER — Encounter (HOSPITAL_COMMUNITY)
Admission: RE | Admit: 2016-04-13 | Discharge: 2016-04-13 | Disposition: A | Discharge status: Home / Self Care | Payer: Self-pay | Source: Ambulatory Visit | Attending: Cardiovascular Disease | Admitting: Cardiovascular Disease

## 2016-04-16 ENCOUNTER — Encounter (HOSPITAL_COMMUNITY): Payer: Self-pay

## 2016-04-18 ENCOUNTER — Encounter (HOSPITAL_COMMUNITY)
Admission: RE | Admit: 2016-04-18 | Discharge: 2016-04-18 | Disposition: A | Discharge status: Home / Self Care | Payer: Self-pay | Source: Ambulatory Visit | Attending: Cardiovascular Disease | Admitting: Cardiovascular Disease

## 2016-04-20 ENCOUNTER — Encounter (HOSPITAL_COMMUNITY): Payer: Self-pay

## 2016-04-20 DIAGNOSIS — Z955 Presence of coronary angioplasty implant and graft: Secondary | ICD-10-CM | POA: Insufficient documentation

## 2016-04-23 ENCOUNTER — Encounter (HOSPITAL_COMMUNITY)
Admission: RE | Admit: 2016-04-23 | Discharge: 2016-04-23 | Disposition: A | Discharge status: Home / Self Care | Payer: Self-pay | Source: Ambulatory Visit | Attending: Cardiovascular Disease | Admitting: Cardiovascular Disease

## 2016-04-25 ENCOUNTER — Encounter (HOSPITAL_COMMUNITY)
Admission: RE | Admit: 2016-04-25 | Discharge: 2016-04-25 | Disposition: A | Discharge status: Home / Self Care | Payer: Self-pay | Source: Ambulatory Visit | Attending: Cardiovascular Disease | Admitting: Cardiovascular Disease

## 2016-04-27 ENCOUNTER — Encounter (HOSPITAL_COMMUNITY): Payer: Self-pay

## 2016-04-30 ENCOUNTER — Encounter (HOSPITAL_COMMUNITY): Payer: Self-pay

## 2016-05-02 ENCOUNTER — Other Ambulatory Visit: Payer: Self-pay | Admitting: *Deleted

## 2016-05-02 ENCOUNTER — Encounter (HOSPITAL_COMMUNITY)
Admission: RE | Admit: 2016-05-02 | Discharge: 2016-05-02 | Disposition: A | Discharge status: Home / Self Care | Payer: Self-pay | Source: Ambulatory Visit | Attending: Cardiovascular Disease | Admitting: Cardiovascular Disease

## 2016-05-02 MED ORDER — ALPRAZOLAM 1 MG PO TABS: ORAL_TABLET | ORAL | 0 refills | Status: DC

## 2016-05-04 ENCOUNTER — Encounter (HOSPITAL_COMMUNITY): Payer: Self-pay

## 2016-05-07 ENCOUNTER — Encounter (HOSPITAL_COMMUNITY)
Admission: RE | Admit: 2016-05-07 | Discharge: 2016-05-07 | Disposition: A | Discharge status: Home / Self Care | Payer: Self-pay | Source: Ambulatory Visit | Attending: Cardiovascular Disease | Admitting: Cardiovascular Disease

## 2016-05-09 ENCOUNTER — Encounter (HOSPITAL_COMMUNITY)
Admission: RE | Admit: 2016-05-09 | Discharge: 2016-05-09 | Disposition: A | Discharge status: Home / Self Care | Payer: Self-pay | Source: Ambulatory Visit | Attending: Cardiovascular Disease | Admitting: Cardiovascular Disease

## 2016-05-11 ENCOUNTER — Encounter (HOSPITAL_COMMUNITY)
Admission: RE | Admit: 2016-05-11 | Discharge: 2016-05-11 | Disposition: A | Discharge status: Home / Self Care | Payer: Self-pay | Source: Ambulatory Visit | Attending: Cardiovascular Disease | Admitting: Cardiovascular Disease

## 2016-05-14 ENCOUNTER — Other Ambulatory Visit: Payer: Self-pay | Admitting: Internal Medicine

## 2016-05-14 ENCOUNTER — Other Ambulatory Visit: Payer: Self-pay | Admitting: Physician Assistant

## 2016-05-14 ENCOUNTER — Encounter (HOSPITAL_COMMUNITY)
Admission: RE | Admit: 2016-05-14 | Discharge: 2016-05-14 | Disposition: A | Discharge status: Home / Self Care | Payer: Self-pay | Source: Ambulatory Visit | Attending: Cardiovascular Disease | Admitting: Cardiovascular Disease

## 2016-05-14 NOTE — Telephone Encounter (Signed)
Please call Depo test

## 2016-05-16 ENCOUNTER — Encounter (HOSPITAL_COMMUNITY)
Admission: RE | Admit: 2016-05-16 | Discharge: 2016-05-16 | Disposition: A | Discharge status: Home / Self Care | Payer: Self-pay | Source: Ambulatory Visit | Attending: Cardiovascular Disease | Admitting: Cardiovascular Disease

## 2016-05-18 ENCOUNTER — Encounter (HOSPITAL_COMMUNITY): Payer: Self-pay

## 2016-05-21 ENCOUNTER — Encounter (HOSPITAL_COMMUNITY)
Admission: RE | Admit: 2016-05-21 | Discharge: 2016-05-21 | Disposition: A | Discharge status: Home / Self Care | Payer: Self-pay | Source: Ambulatory Visit | Attending: Cardiovascular Disease | Admitting: Cardiovascular Disease

## 2016-05-21 DIAGNOSIS — Z955 Presence of coronary angioplasty implant and graft: Secondary | ICD-10-CM | POA: Insufficient documentation

## 2016-05-23 ENCOUNTER — Encounter (HOSPITAL_COMMUNITY)
Admission: RE | Admit: 2016-05-23 | Discharge: 2016-05-23 | Disposition: A | Discharge status: Home / Self Care | Payer: Self-pay | Source: Ambulatory Visit | Attending: Cardiovascular Disease | Admitting: Cardiovascular Disease

## 2016-05-25 ENCOUNTER — Encounter (HOSPITAL_COMMUNITY)
Admission: RE | Admit: 2016-05-25 | Discharge: 2016-05-25 | Disposition: A | Discharge status: Home / Self Care | Payer: Self-pay | Source: Ambulatory Visit | Attending: Cardiovascular Disease | Admitting: Cardiovascular Disease

## 2016-05-28 ENCOUNTER — Encounter (HOSPITAL_COMMUNITY)
Admission: RE | Admit: 2016-05-28 | Discharge: 2016-05-28 | Disposition: A | Discharge status: Home / Self Care | Payer: Self-pay | Source: Ambulatory Visit | Attending: Cardiovascular Disease | Admitting: Cardiovascular Disease

## 2016-05-30 ENCOUNTER — Encounter (HOSPITAL_COMMUNITY): Payer: Self-pay

## 2016-06-01 ENCOUNTER — Encounter (HOSPITAL_COMMUNITY)
Admission: RE | Admit: 2016-06-01 | Discharge: 2016-06-01 | Disposition: A | Discharge status: Home / Self Care | Payer: Self-pay | Source: Ambulatory Visit | Attending: Cardiovascular Disease | Admitting: Cardiovascular Disease

## 2016-06-04 ENCOUNTER — Encounter (HOSPITAL_COMMUNITY): Payer: Self-pay

## 2016-06-06 ENCOUNTER — Encounter (HOSPITAL_COMMUNITY)
Admission: RE | Admit: 2016-06-06 | Discharge: 2016-06-06 | Disposition: A | Discharge status: Home / Self Care | Payer: Self-pay | Source: Ambulatory Visit | Attending: Cardiovascular Disease | Admitting: Cardiovascular Disease

## 2016-06-08 ENCOUNTER — Encounter (HOSPITAL_COMMUNITY): Payer: Self-pay

## 2016-06-11 ENCOUNTER — Encounter (HOSPITAL_COMMUNITY)
Admission: RE | Admit: 2016-06-11 | Discharge: 2016-06-11 | Disposition: A | Discharge status: Home / Self Care | Payer: Self-pay | Source: Ambulatory Visit | Attending: Cardiovascular Disease | Admitting: Cardiovascular Disease

## 2016-06-13 ENCOUNTER — Encounter (HOSPITAL_COMMUNITY)
Admission: RE | Admit: 2016-06-13 | Discharge: 2016-06-13 | Disposition: A | Discharge status: Home / Self Care | Payer: Self-pay | Source: Ambulatory Visit | Attending: Cardiovascular Disease | Admitting: Cardiovascular Disease

## 2016-06-15 ENCOUNTER — Encounter (HOSPITAL_COMMUNITY)
Admission: RE | Admit: 2016-06-15 | Discharge: 2016-06-15 | Disposition: A | Discharge status: Home / Self Care | Payer: Self-pay | Source: Ambulatory Visit | Attending: Cardiovascular Disease | Admitting: Cardiovascular Disease

## 2016-06-18 ENCOUNTER — Encounter (HOSPITAL_COMMUNITY)
Admission: RE | Admit: 2016-06-18 | Discharge: 2016-06-18 | Disposition: A | Discharge status: Home / Self Care | Payer: Self-pay | Source: Ambulatory Visit | Attending: Cardiovascular Disease | Admitting: Cardiovascular Disease

## 2016-06-20 ENCOUNTER — Encounter (HOSPITAL_COMMUNITY): Payer: Self-pay

## 2016-06-20 DIAGNOSIS — Z955 Presence of coronary angioplasty implant and graft: Secondary | ICD-10-CM | POA: Insufficient documentation

## 2016-06-22 ENCOUNTER — Encounter (HOSPITAL_COMMUNITY)
Admission: RE | Admit: 2016-06-22 | Discharge: 2016-06-22 | Disposition: A | Discharge status: Home / Self Care | Payer: Self-pay | Source: Ambulatory Visit | Attending: Cardiovascular Disease | Admitting: Cardiovascular Disease

## 2016-06-25 ENCOUNTER — Encounter (HOSPITAL_COMMUNITY)
Admission: RE | Admit: 2016-06-25 | Discharge: 2016-06-25 | Disposition: A | Discharge status: Home / Self Care | Payer: Self-pay | Source: Ambulatory Visit | Attending: Cardiovascular Disease | Admitting: Cardiovascular Disease

## 2016-06-27 ENCOUNTER — Encounter (HOSPITAL_COMMUNITY)
Admission: RE | Admit: 2016-06-27 | Discharge: 2016-06-27 | Disposition: A | Discharge status: Home / Self Care | Payer: 59 | Source: Ambulatory Visit | Attending: Cardiovascular Disease | Admitting: Cardiovascular Disease

## 2016-06-29 ENCOUNTER — Encounter (HOSPITAL_COMMUNITY)
Admission: RE | Admit: 2016-06-29 | Discharge: 2016-06-29 | Disposition: A | Discharge status: Home / Self Care | Payer: Self-pay | Source: Ambulatory Visit | Attending: Cardiovascular Disease | Admitting: Cardiovascular Disease

## 2016-07-02 ENCOUNTER — Encounter (HOSPITAL_COMMUNITY): Payer: Self-pay

## 2016-07-04 ENCOUNTER — Encounter (HOSPITAL_COMMUNITY): Payer: Self-pay

## 2016-07-06 ENCOUNTER — Encounter (HOSPITAL_COMMUNITY)
Admission: RE | Admit: 2016-07-06 | Discharge: 2016-07-06 | Disposition: A | Discharge status: Home / Self Care | Payer: Self-pay | Source: Ambulatory Visit | Attending: Cardiovascular Disease | Admitting: Cardiovascular Disease

## 2016-07-09 ENCOUNTER — Encounter (HOSPITAL_COMMUNITY)
Admission: RE | Admit: 2016-07-09 | Discharge: 2016-07-09 | Disposition: A | Discharge status: Home / Self Care | Payer: Self-pay | Source: Ambulatory Visit | Attending: Cardiovascular Disease | Admitting: Cardiovascular Disease

## 2016-07-11 ENCOUNTER — Encounter (HOSPITAL_COMMUNITY): Payer: Self-pay

## 2016-07-13 ENCOUNTER — Encounter (HOSPITAL_COMMUNITY)
Admission: RE | Admit: 2016-07-13 | Discharge: 2016-07-13 | Disposition: A | Discharge status: Home / Self Care | Payer: Self-pay | Source: Ambulatory Visit | Attending: Cardiovascular Disease | Admitting: Cardiovascular Disease

## 2016-07-17 ENCOUNTER — Telehealth: Payer: Self-pay | Admitting: *Deleted

## 2016-07-17 NOTE — Telephone Encounter (Signed)
Patient cdalled and states he is having diarrhea, with about 30 liquid stools since yesterday. Diarrhea is not controlled with Pepto Bismol.  Per Dr Melford Aase, the patient can take up to 12 Imodium a day and can continue the [place].

## 2016-07-18 ENCOUNTER — Encounter (HOSPITAL_COMMUNITY): Payer: Self-pay

## 2016-07-20 ENCOUNTER — Encounter (HOSPITAL_COMMUNITY)
Admission: RE | Admit: 2016-07-20 | Discharge: 2016-07-20 | Disposition: A | Discharge status: Home / Self Care | Payer: Self-pay | Source: Ambulatory Visit | Attending: Cardiovascular Disease | Admitting: Cardiovascular Disease

## 2016-07-20 DIAGNOSIS — Z955 Presence of coronary angioplasty implant and graft: Secondary | ICD-10-CM | POA: Insufficient documentation

## 2016-07-23 ENCOUNTER — Encounter (HOSPITAL_COMMUNITY): Payer: Self-pay

## 2016-07-25 ENCOUNTER — Encounter (HOSPITAL_COMMUNITY)
Admission: RE | Admit: 2016-07-25 | Discharge: 2016-07-25 | Disposition: A | Discharge status: Home / Self Care | Payer: Self-pay | Source: Ambulatory Visit | Attending: Cardiovascular Disease | Admitting: Cardiovascular Disease

## 2016-07-27 ENCOUNTER — Encounter (HOSPITAL_COMMUNITY)
Admission: RE | Admit: 2016-07-27 | Discharge: 2016-07-27 | Disposition: A | Discharge status: Home / Self Care | Payer: Self-pay | Source: Ambulatory Visit | Attending: Cardiovascular Disease | Admitting: Cardiovascular Disease

## 2016-07-30 ENCOUNTER — Encounter (HOSPITAL_COMMUNITY)
Admission: RE | Admit: 2016-07-30 | Discharge: 2016-07-30 | Disposition: A | Discharge status: Home / Self Care | Payer: Self-pay | Source: Ambulatory Visit | Attending: Cardiovascular Disease | Admitting: Cardiovascular Disease

## 2016-08-01 ENCOUNTER — Encounter (HOSPITAL_COMMUNITY): Payer: Self-pay

## 2016-08-03 ENCOUNTER — Encounter (HOSPITAL_COMMUNITY): Payer: Self-pay

## 2016-08-06 ENCOUNTER — Encounter (HOSPITAL_COMMUNITY)
Admission: RE | Admit: 2016-08-06 | Discharge: 2016-08-06 | Disposition: A | Discharge status: Home / Self Care | Payer: Self-pay | Source: Ambulatory Visit | Attending: Cardiovascular Disease | Admitting: Cardiovascular Disease

## 2016-08-08 ENCOUNTER — Encounter (HOSPITAL_COMMUNITY)
Admission: RE | Admit: 2016-08-08 | Discharge: 2016-08-08 | Disposition: A | Discharge status: Home / Self Care | Payer: Self-pay | Source: Ambulatory Visit | Attending: Cardiovascular Disease | Admitting: Cardiovascular Disease

## 2016-08-10 ENCOUNTER — Encounter (HOSPITAL_COMMUNITY)
Admission: RE | Admit: 2016-08-10 | Discharge: 2016-08-10 | Disposition: A | Discharge status: Home / Self Care | Payer: Self-pay | Source: Ambulatory Visit | Attending: Cardiovascular Disease | Admitting: Cardiovascular Disease

## 2016-08-13 ENCOUNTER — Encounter (HOSPITAL_COMMUNITY)
Admission: RE | Admit: 2016-08-13 | Discharge: 2016-08-13 | Disposition: A | Discharge status: Home / Self Care | Payer: Self-pay | Source: Ambulatory Visit | Attending: Cardiovascular Disease | Admitting: Cardiovascular Disease

## 2016-08-15 ENCOUNTER — Encounter (HOSPITAL_COMMUNITY)
Admission: RE | Admit: 2016-08-15 | Discharge: 2016-08-15 | Disposition: A | Discharge status: Home / Self Care | Payer: Self-pay | Source: Ambulatory Visit | Attending: Cardiovascular Disease | Admitting: Cardiovascular Disease

## 2016-08-17 ENCOUNTER — Encounter (HOSPITAL_COMMUNITY)
Admission: RE | Admit: 2016-08-17 | Discharge: 2016-08-17 | Disposition: A | Discharge status: Home / Self Care | Payer: Self-pay | Source: Ambulatory Visit | Attending: Cardiovascular Disease | Admitting: Cardiovascular Disease

## 2016-08-20 ENCOUNTER — Encounter (HOSPITAL_COMMUNITY): Payer: Self-pay

## 2016-08-20 DIAGNOSIS — Z955 Presence of coronary angioplasty implant and graft: Secondary | ICD-10-CM | POA: Insufficient documentation

## 2016-08-24 ENCOUNTER — Encounter (HOSPITAL_COMMUNITY): Payer: Self-pay

## 2016-08-27 ENCOUNTER — Encounter (HOSPITAL_COMMUNITY)
Admission: RE | Admit: 2016-08-27 | Discharge: 2016-08-27 | Disposition: A | Discharge status: Home / Self Care | Payer: Self-pay | Source: Ambulatory Visit | Attending: Cardiovascular Disease | Admitting: Cardiovascular Disease

## 2016-08-28 ENCOUNTER — Other Ambulatory Visit: Payer: Self-pay | Admitting: Internal Medicine

## 2016-08-28 NOTE — Telephone Encounter (Signed)
Please call Alpraz  

## 2016-08-29 ENCOUNTER — Encounter (HOSPITAL_COMMUNITY)
Admission: RE | Admit: 2016-08-29 | Discharge: 2016-08-29 | Disposition: A | Discharge status: Home / Self Care | Payer: Self-pay | Source: Ambulatory Visit | Attending: Cardiovascular Disease | Admitting: Cardiovascular Disease

## 2016-08-31 ENCOUNTER — Encounter (HOSPITAL_COMMUNITY)
Admission: RE | Admit: 2016-08-31 | Discharge: 2016-08-31 | Disposition: A | Discharge status: Home / Self Care | Payer: Self-pay | Source: Ambulatory Visit | Attending: Cardiovascular Disease | Admitting: Cardiovascular Disease

## 2016-09-03 ENCOUNTER — Encounter (HOSPITAL_COMMUNITY)
Admission: RE | Admit: 2016-09-03 | Discharge: 2016-09-03 | Disposition: A | Discharge status: Home / Self Care | Payer: Self-pay | Source: Ambulatory Visit | Attending: Cardiovascular Disease | Admitting: Cardiovascular Disease

## 2016-09-05 ENCOUNTER — Encounter (HOSPITAL_COMMUNITY): Payer: Self-pay

## 2016-09-07 ENCOUNTER — Encounter (HOSPITAL_COMMUNITY)
Admission: RE | Admit: 2016-09-07 | Discharge: 2016-09-07 | Disposition: A | Discharge status: Home / Self Care | Payer: Self-pay | Source: Ambulatory Visit | Attending: Cardiovascular Disease | Admitting: Cardiovascular Disease

## 2016-09-10 ENCOUNTER — Encounter (HOSPITAL_COMMUNITY): Payer: Self-pay

## 2016-09-12 ENCOUNTER — Encounter (HOSPITAL_COMMUNITY)
Admission: RE | Admit: 2016-09-12 | Discharge: 2016-09-12 | Disposition: A | Discharge status: Home / Self Care | Payer: Self-pay | Source: Ambulatory Visit | Attending: Cardiovascular Disease | Admitting: Cardiovascular Disease

## 2016-09-14 ENCOUNTER — Encounter (HOSPITAL_COMMUNITY)
Admission: RE | Admit: 2016-09-14 | Discharge: 2016-09-14 | Disposition: A | Discharge status: Home / Self Care | Payer: Self-pay | Source: Ambulatory Visit | Attending: Cardiovascular Disease | Admitting: Cardiovascular Disease

## 2016-09-17 ENCOUNTER — Encounter (HOSPITAL_COMMUNITY): Payer: Self-pay

## 2016-09-19 ENCOUNTER — Encounter (HOSPITAL_COMMUNITY)
Admission: RE | Admit: 2016-09-19 | Discharge: 2016-09-19 | Disposition: A | Discharge status: Home / Self Care | Payer: 59 | Source: Ambulatory Visit | Attending: Cardiovascular Disease | Admitting: Cardiovascular Disease

## 2016-09-19 DIAGNOSIS — Z955 Presence of coronary angioplasty implant and graft: Secondary | ICD-10-CM | POA: Diagnosis not present

## 2016-09-21 ENCOUNTER — Encounter (HOSPITAL_COMMUNITY)
Admission: RE | Admit: 2016-09-21 | Discharge: 2016-09-21 | Disposition: A | Discharge status: Home / Self Care | Payer: 59 | Source: Ambulatory Visit | Attending: Cardiovascular Disease | Admitting: Cardiovascular Disease

## 2016-09-21 ENCOUNTER — Ambulatory Visit (INDEPENDENT_AMBULATORY_CARE_PROVIDER_SITE_OTHER): Payer: 59 | Admitting: Cardiovascular Disease

## 2016-09-21 ENCOUNTER — Encounter: Payer: Self-pay | Admitting: Cardiovascular Disease

## 2016-09-21 VITALS — BP 124/84 | HR 60 | Ht 68.0 in | Wt 200.0 lb

## 2016-09-21 DIAGNOSIS — I10 Essential (primary) hypertension: Secondary | ICD-10-CM

## 2016-09-21 DIAGNOSIS — E782 Mixed hyperlipidemia: Secondary | ICD-10-CM | POA: Diagnosis not present

## 2016-09-21 DIAGNOSIS — I2510 Atherosclerotic heart disease of native coronary artery without angina pectoris: Secondary | ICD-10-CM

## 2016-09-21 NOTE — Assessment & Plan Note (Signed)
History of CAD status post mid LAD FFR guided stenting with a resolute drug-eluting stents by myself performed radially 01/03/11. He's had no recurrent symptoms.

## 2016-09-21 NOTE — Assessment & Plan Note (Signed)
History of hyperlipidemia on statin therapy and recent lipid profile performed 02/10/16 revealed a total cholesterol 162, LDL of 86 and HDL of 62.

## 2016-09-21 NOTE — Assessment & Plan Note (Signed)
History of essential hypertension blood pressure measures 124/84. He is on atenolol, diltiazem and Benicar. Continue current meds at current dosing

## 2016-09-21 NOTE — Patient Instructions (Signed)

## 2016-09-21 NOTE — Progress Notes (Signed)
09/21/2016 Marvin Carter   04/08/1955  [id number]  Primary Physician Unk Pinto, MD Primary Cardiologist: Lorretta Harp MD Marvin Carter, [place]  HPI:  Marvin Carter is a 61 y.o. 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 Eddie Dibbles. I saw him in the office 02/22/15. His risk factors include hypertension, hyperlipidemia and family history. His mother died of an MI at age 45 and father had CABG at age 78. He had a stress test in our office September 15, 2007 that showed mild anterolateral ischemia. He is having knee arthroscopy as an outpatient by Dr. Moshe Salisbury who noted 30 seconds of ventricular tachycardia. He was transferred to [facility] and I cathed him radially January 03, 2011 revealing a 70% mid LAD lesion which was found to be physiologically significant by FFR. I stented him with a Resolute drug-eluting stent. He has had no recurrent symptoms. He participates in cardiac rehab, phase 3 still and finds it helpful. His most recent lab work performed 02/10/16 revealed total cholesterol 152, LDL of 86 and HDL of 62.   Current Meds  Medication Sig  . ALPRAZolam (XANAX) 1 MG tablet TAKE 1/2 TO 1 TABLET BY MOUTH THREE TIMES DAILY AS NEEDED FOR ANXIETY  . aspirin 81 MG tablet Take 81 mg by mouth daily.  Marland Kitchen atenolol (TENORMIN) 100 MG tablet Take 1 tablet (100 mg total) by mouth daily.  Marland Kitchen atorvastatin (LIPITOR) 40 MG tablet TAKE 1 TABLET BY MOUTH  DAILY FOR CHOLESTEROL  . b complex vitamins tablet Take 1 tablet by mouth daily.  . B-D 3CC LUER-LOK SYR 21GX1" 21G X 1" 3 ML MISC USE WITH TESTOSTERONE AS DIRECTED  . buPROPion (WELLBUTRIN XL) 300 MG 24 hr tablet TAKE 1 TABLET BY MOUTH  DAILY FOR MOOD  . clopidogrel (PLAVIX) 75 MG tablet Take 1 tablet (75 mg total) by mouth daily.  Marland Kitchen diltiazem (CARDIZEM CD) 240 MG 24 hr capsule TAKE 1 CAPSULE BY MOUTH  DAILY FOR BLOOD PRESSURE  . doxazosin (CARDURA) 8 MG tablet TAKE 1/2 TO 1  TABLET DAILY FOR BP & PROSTATE  . fenofibrate 160 MG tablet Take 1 tablet (160 mg total) by mouth daily.  . fish oil-omega-3 fatty acids 1000 MG capsule Take 1 g by mouth daily. Takes 3 gm daily  . furosemide (LASIX) 80 MG tablet TAKE 1 TABLET BY MOUTH  DAILY FOR BLOOD PRESSURE  AND FLUID  . HYDROcodone-acetaminophen (NORCO) 10-325 MG per tablet   . meloxicam (MOBIC) 15 MG tablet TAKE 1 TALBET BY MOUTH  DAILY FOR PAIN/INFLAMMATION  . Multiple Vitamin (MULTIVITAMIN WITH MINERALS) TABS Take 2 tablets by mouth daily.   Marland Kitchen NITROSTAT 0.4 MG SL tablet PLACE 1 TABLET (0.4 MG TOTAL) UNDER THE TONGUE EVERY 5 (FIVE) MINUTES AS NEEDED FOR CHEST PAIN.  Marland Kitchen olmesartan (BENICAR) 40 MG tablet TAKE 1 TABLET BY MOUTH  DAILY FOR BLOOD PRESSURE (Patient taking differently: TAKE half tABLET BY MOUTH  DAILY FOR BLOOD PRESSURE)  . testosterone cypionate (DEPOTESTOSTERONE CYPIONATE) 200 MG/ML injection INJECT 2 ML IM ONCE EVERY 12 DAYS     Allergies  Allergen Reactions  . Hctz [Hydrochlorothiazide]   . Tape Rash    Clear tape    Social History   Social History  . Marital status: Married    Spouse name: N/A  . Number of children: 2  . Years of education: N/A   Occupational History  . Self Employed  [employer]  .  [employer]   Social History Main Topics  . Smoking status: Never Smoker  . Smokeless tobacco: Never Used  . Alcohol use 6.0 oz/week    10 Cans of beer per week     Comment: 1-2 beers a night  . Drug use: No  . Sexual activity: Not on file   Other Topics Concern  . Not on file   Social History Narrative  . No narrative on file     Review of Systems: General: negative for chills, fever, night sweats or weight changes.  Cardiovascular: negative for chest pain, dyspnea on exertion, edema, orthopnea, palpitations, paroxysmal nocturnal dyspnea or shortness of breath Dermatological: negative for rash Respiratory: negative for cough or wheezing Urologic: negative for  hematuria Abdominal: negative for nausea, vomiting, diarrhea, bright red blood per rectum, melena, or hematemesis Neurologic: negative for visual changes, syncope, or dizziness All other systems reviewed and are otherwise negative except as noted above.    Blood pressure 124/84, pulse 60, height 5\' 8"  (1.727 m), weight 200 lb (90.7 kg).  General appearance: alert and no distress Neck: no adenopathy, no carotid bruit, no JVD, supple, symmetrical, trachea midline and thyroid not enlarged, symmetric, no tenderness/mass/nodules Lungs: clear to auscultation bilaterally Heart: regular rate and rhythm, S1, S2 normal, no murmur, click, rub or gallop Extremities: extremities normal, atraumatic, no cyanosis or edema  EKG sinus rhythm at 60 with nonspecific ST and T-wave changes. I personally reviewed this EKG.  ASSESSMENT AND PLAN:   CAD (coronary artery disease), native coronary artery History of CAD status post mid LAD FFR guided stenting with a resolute drug-eluting stents by myself performed radially 01/03/11. He's had no recurrent symptoms.  Hypertension History of essential hypertension blood pressure measures 124/84. He is on atenolol, diltiazem and Benicar. Continue current meds at current dosing  Mixed hyperlipidemia History of hyperlipidemia on statin therapy and recent lipid profile performed 02/10/16 revealed a total cholesterol 162, LDL of 86 and HDL of 62.      Lorretta Harp MD FACP,FACC,FAHA, [facility] 09/21/2016 10:20 AM

## 2016-09-24 ENCOUNTER — Encounter (HOSPITAL_COMMUNITY): Payer: 59

## 2016-09-26 ENCOUNTER — Encounter (HOSPITAL_COMMUNITY)
Admission: RE | Admit: 2016-09-26 | Discharge: 2016-09-26 | Disposition: A | Discharge status: Home / Self Care | Payer: 59 | Source: Ambulatory Visit | Attending: Cardiovascular Disease | Admitting: Cardiovascular Disease

## 2016-09-26 NOTE — Progress Notes (Signed)
Assessment and Plan:   NSVT (nonsustained ventricular tachycardia) (HCC) Continue cardio follow up  Atrial flutter, paroxysmal (HCC) Continue cardio follow up  Essential hypertension - continue medications, DASH diet, exercise and monitor at home. Call if greater than 130/80.  -     CBC with Differential/Platelet -     BASIC METABOLIC PANEL WITH GFR -     Hepatic function panel -     TSH  Mixed hyperlipidemia -continue medications, check lipids, decrease fatty foods, increase activity.  -     Lipid panel  Prediabetes Discussed general issues about diabetes pathophysiology and management., Educational material distributed., Suggested low cholesterol diet., Encouraged aerobic exercise., Discussed foot care., Reminded to get yearly retinal exam.  Medication management -     Magnesium  Testosterone deficiency Hypogonadism- continue replacement therapy, check testosterone levels as needed.   Vitamin D deficiency  Hematuria, unspecified type Non smoker, no pain, hematuria x 1, check urine, ? Kidney stone -     Urinalysis, Routine w reflex microscopic -     Urine Culture  Continue diet and meds as discussed. Further disposition pending results of labs.  HPI 60 y.o. male  presents for 3 month follow up with hypertension, hyperlipidemia, prediabetes and vitamin D. His blood pressure has been controlled at home, today their BP is BP: 120/84 He does workout. He denies chest pain, shortness of breath, dizziness.  He has CAD, had stent in Nov 2012, on plavix and bASA, no cardiac symptoms. Still doing cardiac rehab.  1 week ago noticed was having hematuria, no pain, has not happened again, never a smoker. He is on cholesterol medication, fenofibrate and lipitor 40mg  and denies myalgias. His cholesterol is not at goal. The cholesterol last visit was:   Lab Results  Component Value Date   CHOL 162 02/10/2016   HDL 62 02/10/2016   LDLCALC 86 02/10/2016   TRIG 72 02/10/2016   CHOLHDL  2.6 02/10/2016   He has been working on diet and exercise for prediabetes, and denies paresthesia of the feet, polydipsia, polyuria and visual disturbances. Last A1C in the office was:  Lab Results  Component Value Date   HGBA1C 5.1 02/10/2016  Patient is on Vitamin D supplement.   Lab Results  Component Value Date   VD25OH 73 02/10/2016  BMI is Body mass index is 30.32 kg/m., he is working on diet and exercise. Wt Readings from Last 3 Encounters:  09/27/16 199 lb 6.4 oz (90.4 kg)  09/21/16 200 lb (90.7 kg)  02/10/16 201 lb 3.2 oz (91.3 kg)  He has a history of testosterone deficiency and is on testosterone replacement, last injection was 8, 2 cc. He states that the testosterone helps with his energy, libido, muscle mass. Lab Results  Component Value Date   TESTOSTERONE 654 02/10/2016     Current Medications:  Current Outpatient Prescriptions on File Prior to Visit  Medication Sig Dispense Refill  . ALPRAZolam (XANAX) 1 MG tablet TAKE 1/2 TO 1 TABLET BY MOUTH THREE TIMES DAILY AS NEEDED FOR ANXIETY 90 tablet 0  . aspirin 81 MG tablet Take 81 mg by mouth daily.    Marland Kitchen atenolol (TENORMIN) 100 MG tablet Take 1 tablet (100 mg total) by mouth daily. 90 tablet 3  . atorvastatin (LIPITOR) 40 MG tablet TAKE 1 TABLET BY MOUTH  DAILY FOR CHOLESTEROL 90 tablet 3  . b complex vitamins tablet Take 1 tablet by mouth daily.    . B-D 3CC LUER-LOK SYR 21GX1" 21G X 1"  3 ML MISC USE WITH TESTOSTERONE AS DIRECTED 50 each 1  . buPROPion (WELLBUTRIN XL) 300 MG 24 hr tablet TAKE 1 TABLET BY MOUTH  DAILY FOR MOOD 90 tablet 1  . clopidogrel (PLAVIX) 75 MG tablet Take 1 tablet (75 mg total) by mouth daily. 90 tablet 3  . diltiazem (CARDIZEM CD) 240 MG 24 hr capsule TAKE 1 CAPSULE BY MOUTH  DAILY FOR BLOOD PRESSURE 90 capsule 3  . doxazosin (CARDURA) 8 MG tablet TAKE 1/2 TO 1 TABLET DAILY FOR BP & PROSTATE 90 tablet 3  . fenofibrate 160 MG tablet Take 1 tablet (160 mg total) by mouth daily. 90 tablet 3  . fish  oil-omega-3 fatty acids 1000 MG capsule Take 1 g by mouth daily. Takes 3 gm daily    . furosemide (LASIX) 80 MG tablet TAKE 1 TABLET BY MOUTH  DAILY FOR BLOOD PRESSURE  AND FLUID 90 tablet 3  . HYDROcodone-acetaminophen (NORCO) 10-325 MG per tablet   0  . meloxicam (MOBIC) 15 MG tablet TAKE 1 TALBET BY MOUTH  DAILY FOR PAIN/INFLAMMATION 90 tablet 1  . Multiple Vitamin (MULTIVITAMIN WITH MINERALS) TABS Take 2 tablets by mouth daily.     Marland Kitchen NITROSTAT 0.4 MG SL tablet PLACE 1 TABLET (0.4 MG TOTAL) UNDER THE TONGUE EVERY 5 (FIVE) MINUTES AS NEEDED FOR CHEST PAIN. 25 tablet 4  . olmesartan (BENICAR) 40 MG tablet TAKE 1 TABLET BY MOUTH  DAILY FOR BLOOD PRESSURE (Patient taking differently: TAKE half tABLET BY MOUTH  DAILY FOR BLOOD PRESSURE) 90 tablet 1  . testosterone cypionate (DEPOTESTOSTERONE CYPIONATE) 200 MG/ML injection INJECT 2 ML IM ONCE EVERY 12 DAYS 10 mL 2   No current facility-administered medications on file prior to visit.    Medical History:  Past Medical History:  Diagnosis Date  . Anxiety   . Arthritis   . Atrial flutter, paroxysmal ([place]) 01/04/2011  . CAD (coronary artery disease), native coronary artery 01/04/2011  . Diverticulitis   . Diverticulosis   . GERD (gastroesophageal reflux disease)   . Hemarthrosis of knee, right 01/04/2011  . Hemorrhoids   . History of meniscectomy of right knee 01/02/2011  . Hyperlipemia 01/04/2011  . Hyperlipidemia   . Hypertension   . Meniscus tear 01/02/2011  . NSVT (nonsustained ventricular tachycardia) ([place]) 01/02/2011  . Right knee pain 01/02/2011  . Stented coronary artery 01/04/2011   LAD DES Resolute.  . Torn medial meniscus    right knee   Allergies:  Allergies  Allergen Reactions  . Hctz [Hydrochlorothiazide]   . Tape Rash    Clear tape     Review of Systems:  Review of Systems  Constitutional: Negative.   HENT: Negative.   Cardiovascular: Negative.   Gastrointestinal: Negative.   Genitourinary: Negative.    Musculoskeletal: Positive for joint pain (bilateral knee pain, has had injections. ). Negative for back pain, falls, myalgias and neck pain.  Skin: Negative.   Neurological: Negative.   Psychiatric/Behavioral: Negative.    Family history- Review and unchanged Social history- Review and unchanged Physical Exam: BP 120/84   Pulse (!) 58   Temp 98.4 F (36.9 C)   Resp 16   Ht 5\' 8"  (1.727 m)   Wt 199 lb 6.4 oz (90.4 kg)   SpO2 98%   BMI 30.32 kg/m  Wt Readings from Last 3 Encounters:  09/27/16 199 lb 6.4 oz (90.4 kg)  09/21/16 200 lb (90.7 kg)  02/10/16 201 lb 3.2 oz (91.3 kg)   General Appearance: Well  nourished, in no apparent distress. Eyes: PERRLA, EOMs, conjunctiva no swelling or erythema Sinuses: No Frontal/maxillary tenderness ENT/Mouth: Left cauliflower ear, Ext aud canals clear, TMs without erythema, bulging. No erythema, swelling, or exudate on post pharynx.  Tonsils not swollen or erythematous. Hearing normal.  Neck: Supple, thyroid normal.  Respiratory: Respiratory effort normal, BS equal bilaterally without rales, rhonchi, wheezing or stridor.  Cardio: RRR with no MRGs. Brisk peripheral pulses without edema.  Abdomen: Soft, + BS.  Non tender, no guarding, rebound, hernias, masses. Lymphatics: Non tender without lymphadenopathy.  Musculoskeletal: Full ROM, 5/5 strength, normal gait.  Skin: Left ear with dry scaly macule on left ear. Warm, dry without rashes, lesions, ecchymosis.  Neuro: Cranial nerves intact. Normal muscle tone, no cerebellar symptoms. Sensation intact.  Psych: Awake and oriented X 3, normal affect, Insight and Judgment appropriate.    Vicie Mutters, PA-C 9:51 AM [facility]

## 2016-09-27 ENCOUNTER — Ambulatory Visit (INDEPENDENT_AMBULATORY_CARE_PROVIDER_SITE_OTHER): Payer: 59 | Admitting: Physician Assistant

## 2016-09-27 ENCOUNTER — Other Ambulatory Visit: Payer: Self-pay | Admitting: Internal Medicine

## 2016-09-27 ENCOUNTER — Encounter: Payer: Self-pay | Admitting: Physician Assistant

## 2016-09-27 VITALS — BP 120/84 | HR 58 | Temp 98.4°F | Resp 16 | Ht 68.0 in | Wt 199.4 lb

## 2016-09-27 DIAGNOSIS — I472 Ventricular tachycardia: Secondary | ICD-10-CM | POA: Diagnosis not present

## 2016-09-27 DIAGNOSIS — Z79899 Other long term (current) drug therapy: Secondary | ICD-10-CM

## 2016-09-27 DIAGNOSIS — R7303 Prediabetes: Secondary | ICD-10-CM

## 2016-09-27 DIAGNOSIS — I10 Essential (primary) hypertension: Secondary | ICD-10-CM

## 2016-09-27 DIAGNOSIS — E782 Mixed hyperlipidemia: Secondary | ICD-10-CM | POA: Diagnosis not present

## 2016-09-27 DIAGNOSIS — R319 Hematuria, unspecified: Secondary | ICD-10-CM | POA: Diagnosis not present

## 2016-09-27 DIAGNOSIS — E349 Endocrine disorder, unspecified: Secondary | ICD-10-CM | POA: Diagnosis not present

## 2016-09-27 DIAGNOSIS — I4892 Unspecified atrial flutter: Secondary | ICD-10-CM | POA: Diagnosis not present

## 2016-09-27 DIAGNOSIS — E559 Vitamin D deficiency, unspecified: Secondary | ICD-10-CM

## 2016-09-27 DIAGNOSIS — I4729 Other ventricular tachycardia: Secondary | ICD-10-CM

## 2016-09-27 LAB — CBC WITH DIFFERENTIAL/PLATELET
BASOS ABS: 0 {cells}/uL (ref 0–200)
Basophils Relative: 0 %
EOS ABS: 192 {cells}/uL (ref 15–500)
Eosinophils Relative: 3 %
HEMATOCRIT: 44.9 % (ref 38.5–50.0)
Hemoglobin: 15 g/dL (ref 13.2–17.1)
LYMPHS PCT: 31 %
Lymphs Abs: 1984 cells/uL (ref 850–3900)
MCH: 33 pg (ref 27.0–33.0)
MCHC: 33.4 g/dL (ref 32.0–36.0)
MCV: 98.7 fL (ref 80.0–100.0)
MONO ABS: 704 {cells}/uL (ref 200–950)
MPV: 10.3 fL (ref 7.5–12.5)
Monocytes Relative: 11 %
NEUTROS PCT: 55 %
Neutro Abs: 3520 cells/uL (ref 1500–7800)
Platelets: 211 10*3/uL (ref 140–400)
RBC: 4.55 MIL/uL (ref 4.20–5.80)
RDW: 13.3 % (ref 11.0–15.0)
WBC: 6.4 10*3/uL (ref 3.8–10.8)

## 2016-09-27 LAB — HEPATIC FUNCTION PANEL
ALK PHOS: 43 U/L (ref 40–115)
ALT: 24 U/L (ref 9–46)
AST: 37 U/L — ABNORMAL HIGH (ref 10–35)
Albumin: 4.6 g/dL (ref 3.6–5.1)
Bilirubin, Direct: 0.2 mg/dL (ref <=0.2)
Indirect Bilirubin: 0.7 mg/dL (ref 0.2–1.2)
TOTAL PROTEIN: 7.4 g/dL (ref 6.1–8.1)
Total Bilirubin: 0.9 mg/dL (ref 0.2–1.2)

## 2016-09-27 LAB — LIPID PANEL
CHOLESTEROL: 152 mg/dL (ref <200)
HDL: 54 mg/dL (ref >40)
LDL CALC: 77 mg/dL (ref <100)
TRIGLYCERIDES: 105 mg/dL (ref <150)
Total CHOL/HDL Ratio: 2.8 Ratio (ref <5.0)
VLDL: 21 mg/dL (ref <30)

## 2016-09-27 LAB — BASIC METABOLIC PANEL WITH GFR
BUN: 23 mg/dL (ref 7–25)
CALCIUM: 10.1 mg/dL (ref 8.6–10.3)
CO2: 24 mmol/L (ref 20–32)
Chloride: 102 mmol/L (ref 98–110)
Creat: 1.56 mg/dL — ABNORMAL HIGH (ref 0.70–1.25)
GFR, EST AFRICAN AMERICAN: 55 mL/min — AB (ref >=60)
GFR, Est Non African American: 47 mL/min — ABNORMAL LOW (ref >=60)
GLUCOSE: 91 mg/dL (ref 65–99)
Potassium: 4.1 mmol/L (ref 3.5–5.3)
Sodium: 138 mmol/L (ref 135–146)

## 2016-09-27 LAB — TSH: TSH: 1.37 mIU/L (ref 0.40–4.50)

## 2016-09-27 LAB — MAGNESIUM: Magnesium: 1.6 mg/dL (ref 1.5–2.5)

## 2016-09-27 NOTE — Progress Notes (Unsigned)
This very nice 61 y.o.male presents for 3 month follow up with Hypertension, Hyperlipidemia, Pre-Diabetes and Vitamin D Deficiency.      Patient is treated for HTN & BP has been controlled at home. Today's  . Patient has had no complaints of any cardiac type chest pain, palpitations, dyspnea/orthopnea/PND, dizziness, claudication, or dependent edema.     Hyperlipidemia is controlled with diet & meds. Patient denies myalgias or other med SE's. Last Lipids were  Lab Results  Component Value Date   CHOL 162 02/10/2016   HDL 62 02/10/2016   LDLCALC 86 02/10/2016   TRIG 72 02/10/2016   CHOLHDL 2.6 02/10/2016      Also, the patient has history of T2_NIDDM PreDiabetes and has had no symptoms of reactive hypoglycemia, diabetic polys, paresthesias or visual blurring.  Last A1c was  Lab Results  Component Value Date   HGBA1C 5.1 02/10/2016      Further, the patient also has history of Vitamin D Deficiency and supplements vitamin D without any suspected side-effects. Last vitamin D was   Lab Results  Component Value Date   VD25OH 87 02/10/2016   Current Outpatient Prescriptions on File Prior to Visit  Medication Sig  . ALPRAZolam (XANAX) 1 MG tablet TAKE 1/2 TO 1 TABLET BY MOUTH THREE TIMES DAILY AS NEEDED FOR ANXIETY  . aspirin 81 MG tablet Take 81 mg by mouth daily.  Marland Kitchen atenolol (TENORMIN) 100 MG tablet Take 1 tablet (100 mg total) by mouth daily.  Marland Kitchen atorvastatin (LIPITOR) 40 MG tablet TAKE 1 TABLET BY MOUTH  DAILY FOR CHOLESTEROL  . b complex vitamins tablet Take 1 tablet by mouth daily.  . B-D 3CC LUER-LOK SYR 21GX1" 21G X 1" 3 ML MISC USE WITH TESTOSTERONE AS DIRECTED  . buPROPion (WELLBUTRIN XL) 300 MG 24 hr tablet TAKE 1 TABLET BY MOUTH  DAILY FOR MOOD  . clopidogrel (PLAVIX) 75 MG tablet Take 1 tablet (75 mg total) by mouth daily.  Marland Kitchen diltiazem (CARDIZEM CD) 240 MG 24 hr capsule TAKE 1 CAPSULE BY MOUTH  DAILY FOR BLOOD PRESSURE  . doxazosin (CARDURA) 8 MG tablet TAKE 1/2 TO 1  TABLET DAILY FOR BP & PROSTATE  . fenofibrate 160 MG tablet Take 1 tablet (160 mg total) by mouth daily.  . fish oil-omega-3 fatty acids 1000 MG capsule Take 1 g by mouth daily. Takes 3 gm daily  . furosemide (LASIX) 80 MG tablet TAKE 1 TABLET BY MOUTH  DAILY FOR BLOOD PRESSURE  AND FLUID  . HYDROcodone-acetaminophen (NORCO) 10-325 MG per tablet   . meloxicam (MOBIC) 15 MG tablet TAKE 1 TALBET BY MOUTH  DAILY FOR PAIN/INFLAMMATION  . Multiple Vitamin (MULTIVITAMIN WITH MINERALS) TABS Take 2 tablets by mouth daily.   Marland Kitchen NITROSTAT 0.4 MG SL tablet PLACE 1 TABLET (0.4 MG TOTAL) UNDER THE TONGUE EVERY 5 (FIVE) MINUTES AS NEEDED FOR CHEST PAIN.  Marland Kitchen olmesartan (BENICAR) 40 MG tablet TAKE 1 TABLET BY MOUTH  DAILY FOR BLOOD PRESSURE (Patient taking differently: TAKE half tABLET BY MOUTH  DAILY FOR BLOOD PRESSURE)  . testosterone cypionate (DEPOTESTOSTERONE CYPIONATE) 200 MG/ML injection INJECT 2 ML IM ONCE EVERY 12 DAYS   No current facility-administered medications on file prior to visit.    Allergies  Allergen Reactions  . Hctz [Hydrochlorothiazide]   . Tape Rash    Clear tape   PMHx:   Past Medical History:  Diagnosis Date  . Anxiety   . Arthritis   . Atrial flutter, paroxysmal (  [place]) 01/04/2011  . CAD (coronary artery disease), native coronary artery 01/04/2011  . Diverticulitis   . Diverticulosis   . GERD (gastroesophageal reflux disease)   . Hemarthrosis of knee, right 01/04/2011  . Hemorrhoids   . History of meniscectomy of right knee 01/02/2011  . Hyperlipemia 01/04/2011  . Hyperlipidemia   . Hypertension   . Meniscus tear 01/02/2011  . NSVT (nonsustained ventricular tachycardia) ([place]) 01/02/2011  . Right knee pain 01/02/2011  . Stented coronary artery 01/04/2011   LAD DES Resolute.  . Torn medial meniscus    right knee   Immunization History  Administered Date(s) Administered  . Influenza Split 12/16/2013, 12/02/2014  . Influenza Whole 12/09/2012  . Influenza, Seasonal,  Injecte, Preservative Fre 12/01/2015  . PPD Test 11/05/2013, 01/04/2015, 02/10/2016  . Td 02/19/2005  . Tdap 03/01/2015   Past Surgical History:  Procedure Laterality Date  . APPENDECTOMY    . bicept tendon repair     bilateral  . CORONARY ANGIOPLASTY WITH STENT PLACEMENT  2012  . HAND SURGERY     right  . KNEE ARTHROSCOPY     x 8 left  . KNEE ARTHROSCOPY Right 04/21/2012   Procedure: ARTHROSCOPY KNEE;  Surgeon: Lorn Junes, MD;  Location: [facility];  Service: Orthopedics;  Laterality: Right;  medial and lateral meniscectomies  . LEFT HEART CATHETERIZATION WITH CORONARY ANGIOGRAM N/A 01/03/2011   Procedure: LEFT HEART CATHETERIZATION WITH CORONARY ANGIOGRAM;  Surgeon: Lorretta Harp, MD;  Location: [facility] CATH LAB;  Service: Cardiovascular;  Laterality: N/A;  . PERCUTANEOUS CORONARY STENT INTERVENTION (PCI-S) N/A 01/03/2011   Procedure: PERCUTANEOUS CORONARY STENT INTERVENTION (PCI-S);  Surgeon: Lorretta Harp, MD;  Location: [facility] CATH LAB;  Service: Cardiovascular;  Laterality: N/A;  . ROTATOR CUFF REPAIR     bilateral  . TONSILLECTOMY     FHx:    Reviewed / unchanged  SHx:    Reviewed / unchanged  Systems Review:  Constitutional: Denies fever, chills, wt changes, headaches, insomnia, fatigue, night sweats, change in appetite. Eyes: Denies redness, blurred vision, diplopia, discharge, itchy, watery eyes.  ENT: Denies discharge, congestion, post nasal drip, epistaxis, sore throat, earache, hearing loss, dental pain, tinnitus, vertigo, sinus pain, snoring.  CV: Denies chest pain, palpitations, irregular heartbeat, syncope, dyspnea, diaphoresis, orthopnea, PND, claudication or edema. Respiratory: denies cough, dyspnea, DOE, pleurisy, hoarseness, laryngitis, wheezing.  Gastrointestinal: Denies dysphagia, odynophagia, heartburn, reflux, water brash, abdominal pain or cramps, nausea, vomiting, bloating, diarrhea, constipation, hematemesis, melena, hematochezia  or  hemorrhoids. Genitourinary: Denies dysuria, frequency, urgency, nocturia, hesitancy, discharge, hematuria or flank pain. Musculoskeletal: Denies arthralgias, myalgias, stiffness, jt. swelling, pain, limping or strain/sprain.  Skin: Denies pruritus, rash, hives, warts, acne, eczema or change in skin lesion(s). Neuro: No weakness, tremor, incoordination, spasms, paresthesia or pain. Psychiatric: Denies confusion, memory loss or sensory loss. Endo: Denies change in weight, skin or hair change.  Heme/Lymph: No excessive bleeding, bruising or enlarged lymph nodes.  Physical Exam  There were no vitals taken for this visit.  Appears well nourished, well groomed  and in no distress.  Eyes: PERRLA, EOMs, conjunctiva no swelling or erythema. Sinuses: No frontal/maxillary tenderness ENT/Mouth: EAC's clear, TM's nl w/o erythema, bulging. Nares clear w/o erythema, swelling, exudates. Oropharynx clear without erythema or exudates. Oral hygiene is good. Tongue normal, non obstructing. Hearing intact.  Neck: Supple. Thyroid nl. Car 2+/2+ without bruits, nodes or JVD. Chest: Respirations nl with BS clear & equal w/o rales, rhonchi, wheezing or stridor.  Cor: Heart sounds normal w/ regular  rate and rhythm without sig. murmurs, gallops, clicks or rubs. Peripheral pulses normal and equal  without edema.  Abdomen: Soft & bowel sounds normal. Non-tender w/o guarding, rebound, hernias, masses or organomegaly.  Lymphatics: Unremarkable.  Musculoskeletal: Full ROM all peripheral extremities, joint stability, 5/5 strength and normal gait.  Skin: Warm, dry without exposed rashes, lesions or ecchymosis apparent.  Neuro: Cranial nerves intact, reflexes equal bilaterally. Sensory-motor testing grossly intact. Tendon reflexes grossly intact.  Pysch: Alert & oriented x 3.  Insight and judgement nl & appropriate. No ideations.  Assessment and Plan:  - Continue medication, monitor blood pressure at home.  - Continue  DASH diet. Reminder to go to the ER if any CP,  SOB, nausea, dizziness, severe HA, changes vision/speech.  - Continue diet/meds, exercise,& lifestyle modifications.  - Continue monitor periodic cholesterol/liver & renal functions   - Continue diet, exercise, lifestyle modifications.  - Monitor appropriate labs. - Continue supplementation.      Discussed  regular exercise, BP monitoring, weight control to achieve/maintain BMI less than 25 and discussed med and SE's. Recommended labs to assess and monitor clinical status with further disposition pending results of labs. Over 30 minutes of exam, counseling, chart review was performed.

## 2016-09-28 ENCOUNTER — Encounter (HOSPITAL_COMMUNITY): Payer: 59

## 2016-09-28 LAB — URINALYSIS, ROUTINE W REFLEX MICROSCOPIC
BILIRUBIN URINE: NEGATIVE
Glucose, UA: NEGATIVE
Hgb urine dipstick: NEGATIVE
KETONES UR: NEGATIVE
Leukocytes, UA: NEGATIVE
Nitrite: NEGATIVE
PROTEIN: NEGATIVE
Specific Gravity, Urine: 1.008 (ref 1.001–1.035)
pH: 6.5 (ref 5.0–8.0)

## 2016-09-28 LAB — URINE CULTURE: ORGANISM ID, BACTERIA: NO GROWTH

## 2016-10-01 ENCOUNTER — Encounter (HOSPITAL_COMMUNITY)
Admission: RE | Admit: 2016-10-01 | Discharge: 2016-10-01 | Disposition: A | Discharge status: Home / Self Care | Payer: 59 | Source: Ambulatory Visit | Attending: Cardiovascular Disease | Admitting: Cardiovascular Disease

## 2016-10-03 ENCOUNTER — Encounter (HOSPITAL_COMMUNITY)
Admission: RE | Admit: 2016-10-03 | Discharge: 2016-10-03 | Disposition: A | Discharge status: Home / Self Care | Payer: 59 | Source: Ambulatory Visit | Attending: Cardiovascular Disease | Admitting: Cardiovascular Disease

## 2016-10-05 ENCOUNTER — Encounter (HOSPITAL_COMMUNITY)
Admission: RE | Admit: 2016-10-05 | Discharge: 2016-10-05 | Disposition: A | Discharge status: Home / Self Care | Payer: 59 | Source: Ambulatory Visit | Attending: Cardiovascular Disease | Admitting: Cardiovascular Disease

## 2016-10-08 ENCOUNTER — Encounter (HOSPITAL_COMMUNITY)
Admission: RE | Admit: 2016-10-08 | Discharge: 2016-10-08 | Disposition: A | Discharge status: Home / Self Care | Payer: 59 | Source: Ambulatory Visit | Attending: Cardiovascular Disease | Admitting: Cardiovascular Disease

## 2016-10-08 DIAGNOSIS — M170 Bilateral primary osteoarthritis of knee: Secondary | ICD-10-CM | POA: Diagnosis not present

## 2016-10-10 ENCOUNTER — Encounter (HOSPITAL_COMMUNITY): Payer: 59

## 2016-10-12 ENCOUNTER — Encounter (HOSPITAL_COMMUNITY)
Admission: RE | Admit: 2016-10-12 | Discharge: 2016-10-12 | Disposition: A | Discharge status: Home / Self Care | Payer: 59 | Source: Ambulatory Visit | Attending: Cardiovascular Disease | Admitting: Cardiovascular Disease

## 2016-10-15 ENCOUNTER — Encounter (HOSPITAL_COMMUNITY): Payer: 59

## 2016-10-17 ENCOUNTER — Encounter (HOSPITAL_COMMUNITY)
Admission: RE | Admit: 2016-10-17 | Discharge: 2016-10-17 | Disposition: A | Discharge status: Home / Self Care | Payer: 59 | Source: Ambulatory Visit | Attending: Cardiovascular Disease | Admitting: Cardiovascular Disease

## 2016-10-19 ENCOUNTER — Encounter (HOSPITAL_COMMUNITY): Payer: 59

## 2016-10-20 ENCOUNTER — Other Ambulatory Visit: Payer: Self-pay | Admitting: Internal Medicine

## 2016-10-24 ENCOUNTER — Encounter (HOSPITAL_COMMUNITY)
Admission: RE | Admit: 2016-10-24 | Discharge: 2016-10-24 | Disposition: A | Discharge status: Home / Self Care | Payer: 59 | Source: Ambulatory Visit | Attending: Cardiovascular Disease | Admitting: Cardiovascular Disease

## 2016-10-24 DIAGNOSIS — Z955 Presence of coronary angioplasty implant and graft: Secondary | ICD-10-CM | POA: Diagnosis not present

## 2016-10-26 ENCOUNTER — Encounter (HOSPITAL_COMMUNITY)
Admission: RE | Admit: 2016-10-26 | Discharge: 2016-10-26 | Disposition: A | Discharge status: Home / Self Care | Payer: 59 | Source: Ambulatory Visit | Attending: Cardiovascular Disease | Admitting: Cardiovascular Disease

## 2016-10-29 ENCOUNTER — Encounter (HOSPITAL_COMMUNITY)
Admission: RE | Admit: 2016-10-29 | Discharge: 2016-10-29 | Disposition: A | Discharge status: Home / Self Care | Payer: 59 | Source: Ambulatory Visit | Attending: Cardiovascular Disease | Admitting: Cardiovascular Disease

## 2016-10-31 ENCOUNTER — Encounter (HOSPITAL_COMMUNITY)
Admission: RE | Admit: 2016-10-31 | Discharge: 2016-10-31 | Disposition: A | Discharge status: Home / Self Care | Payer: 59 | Source: Ambulatory Visit | Attending: Cardiovascular Disease | Admitting: Cardiovascular Disease

## 2016-11-01 ENCOUNTER — Other Ambulatory Visit: Payer: Self-pay | Admitting: Internal Medicine

## 2016-11-01 MED ORDER — TESTOSTERONE CYPIONATE 200 MG/ML IM SOLN: INTRAMUSCULAR | 2 refills | Status: DC

## 2016-11-02 ENCOUNTER — Encounter (HOSPITAL_COMMUNITY): Payer: 59

## 2016-11-05 ENCOUNTER — Encounter (HOSPITAL_COMMUNITY): Payer: 59

## 2016-11-07 ENCOUNTER — Encounter (HOSPITAL_COMMUNITY)
Admission: RE | Admit: 2016-11-07 | Discharge: 2016-11-07 | Disposition: A | Discharge status: Home / Self Care | Payer: 59 | Source: Ambulatory Visit | Attending: Cardiovascular Disease | Admitting: Cardiovascular Disease

## 2016-11-09 ENCOUNTER — Encounter (HOSPITAL_COMMUNITY): Payer: 59

## 2016-11-12 ENCOUNTER — Encounter (HOSPITAL_COMMUNITY)
Admission: RE | Admit: 2016-11-12 | Discharge: 2016-11-12 | Disposition: A | Discharge status: Home / Self Care | Payer: 59 | Source: Ambulatory Visit | Attending: Cardiovascular Disease | Admitting: Cardiovascular Disease

## 2016-11-14 ENCOUNTER — Encounter (HOSPITAL_COMMUNITY): Payer: 59

## 2016-11-16 ENCOUNTER — Encounter (HOSPITAL_COMMUNITY)
Admission: RE | Admit: 2016-11-16 | Discharge: 2016-11-16 | Disposition: A | Discharge status: Home / Self Care | Payer: 59 | Source: Ambulatory Visit | Attending: Cardiovascular Disease | Admitting: Cardiovascular Disease

## 2016-11-19 ENCOUNTER — Encounter (HOSPITAL_COMMUNITY)
Admission: RE | Admit: 2016-11-19 | Discharge: 2016-11-19 | Disposition: A | Discharge status: Home / Self Care | Payer: Self-pay | Source: Ambulatory Visit | Attending: Cardiovascular Disease | Admitting: Cardiovascular Disease

## 2016-11-19 DIAGNOSIS — Z955 Presence of coronary angioplasty implant and graft: Secondary | ICD-10-CM | POA: Diagnosis not present

## 2016-11-21 ENCOUNTER — Encounter (HOSPITAL_COMMUNITY)
Admission: RE | Admit: 2016-11-21 | Discharge: 2016-11-21 | Disposition: A | Discharge status: Home / Self Care | Payer: Self-pay | Source: Ambulatory Visit | Attending: Cardiovascular Disease | Admitting: Cardiovascular Disease

## 2016-11-23 ENCOUNTER — Encounter (HOSPITAL_COMMUNITY): Payer: Self-pay

## 2016-11-26 ENCOUNTER — Encounter (HOSPITAL_COMMUNITY)
Admission: RE | Admit: 2016-11-26 | Discharge: 2016-11-26 | Disposition: A | Discharge status: Home / Self Care | Payer: Self-pay | Source: Ambulatory Visit | Attending: Cardiovascular Disease | Admitting: Cardiovascular Disease

## 2016-11-28 ENCOUNTER — Encounter (HOSPITAL_COMMUNITY)
Admission: RE | Admit: 2016-11-28 | Discharge: 2016-11-28 | Disposition: A | Discharge status: Home / Self Care | Payer: Self-pay | Source: Ambulatory Visit | Attending: Cardiovascular Disease | Admitting: Cardiovascular Disease

## 2016-11-30 ENCOUNTER — Encounter (HOSPITAL_COMMUNITY): Payer: Self-pay

## 2016-12-03 ENCOUNTER — Encounter (HOSPITAL_COMMUNITY)
Admission: RE | Admit: 2016-12-03 | Discharge: 2016-12-03 | Disposition: A | Discharge status: Home / Self Care | Payer: Self-pay | Source: Ambulatory Visit | Attending: Cardiovascular Disease | Admitting: Cardiovascular Disease

## 2016-12-05 ENCOUNTER — Encounter (HOSPITAL_COMMUNITY)
Admission: RE | Admit: 2016-12-05 | Discharge: 2016-12-05 | Disposition: A | Discharge status: Home / Self Care | Payer: Self-pay | Source: Ambulatory Visit | Attending: Cardiovascular Disease | Admitting: Cardiovascular Disease

## 2016-12-07 ENCOUNTER — Encounter (HOSPITAL_COMMUNITY): Payer: Self-pay

## 2016-12-10 ENCOUNTER — Encounter (HOSPITAL_COMMUNITY): Payer: Self-pay

## 2016-12-12 ENCOUNTER — Encounter (HOSPITAL_COMMUNITY)
Admission: RE | Admit: 2016-12-12 | Discharge: 2016-12-12 | Disposition: A | Discharge status: Home / Self Care | Payer: 59 | Source: Ambulatory Visit | Attending: Cardiovascular Disease | Admitting: Cardiovascular Disease

## 2016-12-14 ENCOUNTER — Encounter (HOSPITAL_COMMUNITY): Payer: Self-pay

## 2016-12-17 ENCOUNTER — Encounter (HOSPITAL_COMMUNITY): Payer: Self-pay

## 2016-12-19 ENCOUNTER — Encounter (HOSPITAL_COMMUNITY): Payer: Self-pay

## 2016-12-21 ENCOUNTER — Encounter (HOSPITAL_COMMUNITY)
Admission: RE | Admit: 2016-12-21 | Discharge: 2016-12-21 | Disposition: A | Discharge status: Home / Self Care | Payer: Self-pay | Source: Ambulatory Visit | Attending: Cardiovascular Disease | Admitting: Cardiovascular Disease

## 2016-12-21 DIAGNOSIS — Z955 Presence of coronary angioplasty implant and graft: Secondary | ICD-10-CM | POA: Insufficient documentation

## 2016-12-24 ENCOUNTER — Encounter (HOSPITAL_COMMUNITY)
Admission: RE | Admit: 2016-12-24 | Discharge: 2016-12-24 | Disposition: A | Discharge status: Home / Self Care | Payer: Self-pay | Source: Ambulatory Visit | Attending: Cardiovascular Disease | Admitting: Cardiovascular Disease

## 2016-12-26 ENCOUNTER — Encounter (HOSPITAL_COMMUNITY): Payer: Self-pay

## 2016-12-28 ENCOUNTER — Encounter (HOSPITAL_COMMUNITY)
Admission: RE | Admit: 2016-12-28 | Discharge: 2016-12-28 | Disposition: A | Discharge status: Home / Self Care | Payer: Self-pay | Source: Ambulatory Visit | Attending: Cardiovascular Disease | Admitting: Cardiovascular Disease

## 2016-12-31 ENCOUNTER — Encounter (HOSPITAL_COMMUNITY): Payer: Self-pay

## 2017-01-02 ENCOUNTER — Encounter (HOSPITAL_COMMUNITY)
Admission: RE | Admit: 2017-01-02 | Discharge: 2017-01-02 | Disposition: A | Discharge status: Home / Self Care | Payer: Self-pay | Source: Ambulatory Visit | Attending: Cardiovascular Disease | Admitting: Cardiovascular Disease

## 2017-01-04 ENCOUNTER — Encounter (HOSPITAL_COMMUNITY): Payer: Self-pay

## 2017-01-07 ENCOUNTER — Encounter (HOSPITAL_COMMUNITY): Payer: Self-pay

## 2017-01-09 ENCOUNTER — Encounter (HOSPITAL_COMMUNITY)
Admission: RE | Admit: 2017-01-09 | Discharge: 2017-01-09 | Disposition: A | Discharge status: Home / Self Care | Payer: Self-pay | Source: Ambulatory Visit | Attending: Cardiovascular Disease | Admitting: Cardiovascular Disease

## 2017-01-14 ENCOUNTER — Encounter (HOSPITAL_COMMUNITY)
Admission: RE | Admit: 2017-01-14 | Discharge: 2017-01-14 | Disposition: A | Discharge status: Home / Self Care | Payer: Self-pay | Source: Ambulatory Visit | Attending: Cardiovascular Disease | Admitting: Cardiovascular Disease

## 2017-01-15 ENCOUNTER — Other Ambulatory Visit: Payer: Self-pay | Admitting: Cardiovascular Disease

## 2017-01-16 ENCOUNTER — Encounter (HOSPITAL_COMMUNITY): Payer: Self-pay

## 2017-01-18 ENCOUNTER — Encounter (HOSPITAL_COMMUNITY): Payer: Self-pay

## 2017-01-19 ENCOUNTER — Other Ambulatory Visit: Payer: Self-pay | Admitting: Internal Medicine

## 2017-01-21 ENCOUNTER — Encounter (HOSPITAL_COMMUNITY)
Admission: RE | Admit: 2017-01-21 | Discharge: 2017-01-21 | Disposition: A | Discharge status: Home / Self Care | Payer: Self-pay | Source: Ambulatory Visit | Attending: Cardiovascular Disease | Admitting: Cardiovascular Disease

## 2017-01-21 DIAGNOSIS — Z955 Presence of coronary angioplasty implant and graft: Secondary | ICD-10-CM | POA: Insufficient documentation

## 2017-01-21 DIAGNOSIS — M170 Bilateral primary osteoarthritis of knee: Secondary | ICD-10-CM | POA: Diagnosis not present

## 2017-01-22 ENCOUNTER — Telehealth: Payer: Self-pay

## 2017-01-22 NOTE — Telephone Encounter (Signed)
   [facility] Pre-operative Risk Assessment    Request for surgical clearance:  1. What type of surgery is being performed - right total knee replacement.  2. When is this surgery scheduled- pending  3. Are there any medications that need to be held prior to surgery and how long - Plavix and Aspirin   4. Practice name and name of physician performing surgery - [facility]  5. What is your office phone and fax number phone # [phone]  Fax # [phone]  6. Anesthesia type (None, local, MAC, general) - not mentioned   Kathyrn Lass 01/22/2017, 11:36 AM  _________________________________________________________________   (provider comments below)

## 2017-01-22 NOTE — Telephone Encounter (Signed)
   Primary Cardiologist: Quay Burow, MD  Chart reviewed as part of pre-operative protocol coverage. Patient was contacted 01/22/2017 in reference to pre-operative risk assessment for pending surgery as outlined below.  Garold Sheeler was last seen on 09/21/16 by Dr .Gwenlyn Found.  Since that day, [place] Youngblood has done very well. RCRI calculated at 0.9%, indicating low risk of cardiac event. 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 He reports surgery will likely be scheduled in February. As this is scheduled out, he was advised to contact office if any change in status or any new cardiac symptoms. The surgeon's office can also contact us if any new concerns arise before surgery. Therefore, based on ACC/AHA guidelines, the patient would be at acceptable risk for the planned procedure without further cardiovascular testing.   Will route message to Dr. Gwenlyn Found for final recs on aspirin/Plavix and get final approval for surgery - prior PCI to LAD in 2012 following 30 seconds of VT during surgery, but doing well as above. Dr. Gwenlyn Found, please route reply to P CV DIV PREOP, thanks!  Charlie Pitter, PA-C 01/22/2017, 3:51 PM

## 2017-01-23 ENCOUNTER — Encounter (HOSPITAL_COMMUNITY)
Admission: RE | Admit: 2017-01-23 | Discharge: 2017-01-23 | Disposition: A | Discharge status: Home / Self Care | Payer: Self-pay | Source: Ambulatory Visit | Attending: Cardiovascular Disease | Admitting: Cardiovascular Disease

## 2017-01-23 NOTE — Telephone Encounter (Signed)
Okay to hold his antiplatelet therapy for surgery.

## 2017-01-25 ENCOUNTER — Encounter (HOSPITAL_COMMUNITY): Payer: Self-pay

## 2017-01-28 ENCOUNTER — Encounter (HOSPITAL_COMMUNITY): Payer: Self-pay

## 2017-01-30 ENCOUNTER — Encounter (HOSPITAL_COMMUNITY)
Admission: RE | Admit: 2017-01-30 | Discharge: 2017-01-30 | Disposition: A | Discharge status: Home / Self Care | Payer: Self-pay | Source: Ambulatory Visit | Attending: Cardiovascular Disease | Admitting: Cardiovascular Disease

## 2017-02-01 ENCOUNTER — Encounter (HOSPITAL_COMMUNITY): Payer: Self-pay

## 2017-02-03 ENCOUNTER — Other Ambulatory Visit: Payer: Self-pay | Admitting: Internal Medicine

## 2017-02-04 ENCOUNTER — Other Ambulatory Visit: Payer: Self-pay | Admitting: Internal Medicine

## 2017-02-04 ENCOUNTER — Encounter (HOSPITAL_COMMUNITY)
Admission: RE | Admit: 2017-02-04 | Discharge: 2017-02-04 | Disposition: A | Discharge status: Home / Self Care | Payer: Self-pay | Source: Ambulatory Visit | Attending: Cardiovascular Disease | Admitting: Cardiovascular Disease

## 2017-02-04 NOTE — Telephone Encounter (Signed)
Xanax has been called into pharmacy on Dec 17th 2018 by DD

## 2017-02-06 ENCOUNTER — Encounter (HOSPITAL_COMMUNITY)
Admission: RE | Admit: 2017-02-06 | Discharge: 2017-02-06 | Disposition: A | Discharge status: Home / Self Care | Payer: Self-pay | Source: Ambulatory Visit | Attending: Cardiovascular Disease | Admitting: Cardiovascular Disease

## 2017-02-08 ENCOUNTER — Encounter (HOSPITAL_COMMUNITY): Payer: Self-pay

## 2017-02-13 ENCOUNTER — Encounter (HOSPITAL_COMMUNITY): Payer: Self-pay

## 2017-02-15 ENCOUNTER — Encounter (HOSPITAL_COMMUNITY): Payer: Self-pay

## 2017-02-17 ENCOUNTER — Other Ambulatory Visit: Payer: Self-pay | Admitting: Internal Medicine

## 2017-02-18 ENCOUNTER — Encounter (HOSPITAL_COMMUNITY): Payer: Self-pay

## 2017-02-20 ENCOUNTER — Encounter (HOSPITAL_COMMUNITY)
Admission: RE | Admit: 2017-02-20 | Discharge: 2017-02-20 | Disposition: A | Discharge status: Home / Self Care | Payer: Self-pay | Source: Ambulatory Visit | Attending: Cardiovascular Disease | Admitting: Cardiovascular Disease

## 2017-02-20 DIAGNOSIS — Z955 Presence of coronary angioplasty implant and graft: Secondary | ICD-10-CM | POA: Insufficient documentation

## 2017-02-22 ENCOUNTER — Encounter (HOSPITAL_COMMUNITY)
Admission: RE | Admit: 2017-02-22 | Discharge: 2017-02-22 | Disposition: A | Discharge status: Home / Self Care | Payer: Self-pay | Source: Ambulatory Visit | Attending: Cardiovascular Disease | Admitting: Cardiovascular Disease

## 2017-02-25 ENCOUNTER — Encounter (HOSPITAL_COMMUNITY)
Admission: RE | Admit: 2017-02-25 | Discharge: 2017-02-25 | Disposition: A | Discharge status: Home / Self Care | Payer: Self-pay | Source: Ambulatory Visit | Attending: Cardiovascular Disease | Admitting: Cardiovascular Disease

## 2017-02-27 ENCOUNTER — Encounter (HOSPITAL_COMMUNITY): Payer: Self-pay

## 2017-03-01 ENCOUNTER — Encounter (HOSPITAL_COMMUNITY)
Admission: RE | Admit: 2017-03-01 | Discharge: 2017-03-01 | Disposition: A | Discharge status: Home / Self Care | Payer: 59 | Source: Ambulatory Visit | Attending: Cardiovascular Disease | Admitting: Cardiovascular Disease

## 2017-03-04 ENCOUNTER — Encounter (HOSPITAL_COMMUNITY)
Admission: RE | Admit: 2017-03-04 | Discharge: 2017-03-04 | Disposition: A | Discharge status: Home / Self Care | Payer: Self-pay | Source: Ambulatory Visit | Attending: Cardiovascular Disease | Admitting: Cardiovascular Disease

## 2017-03-06 ENCOUNTER — Encounter (HOSPITAL_COMMUNITY)
Admission: RE | Admit: 2017-03-06 | Discharge: 2017-03-06 | Disposition: A | Discharge status: Home / Self Care | Payer: Self-pay | Source: Ambulatory Visit | Attending: Cardiovascular Disease | Admitting: Cardiovascular Disease

## 2017-03-08 ENCOUNTER — Encounter (HOSPITAL_COMMUNITY)
Admission: RE | Admit: 2017-03-08 | Discharge: 2017-03-08 | Disposition: A | Discharge status: Home / Self Care | Payer: Self-pay | Source: Ambulatory Visit | Attending: Cardiovascular Disease | Admitting: Cardiovascular Disease

## 2017-03-11 ENCOUNTER — Encounter (HOSPITAL_COMMUNITY): Payer: Self-pay

## 2017-03-13 ENCOUNTER — Encounter (HOSPITAL_COMMUNITY)
Admission: RE | Admit: 2017-03-13 | Discharge: 2017-03-13 | Disposition: A | Discharge status: Home / Self Care | Payer: Self-pay | Source: Ambulatory Visit | Attending: Cardiovascular Disease | Admitting: Cardiovascular Disease

## 2017-03-15 ENCOUNTER — Encounter (HOSPITAL_COMMUNITY): Payer: Self-pay

## 2017-03-18 ENCOUNTER — Encounter (HOSPITAL_COMMUNITY)
Admission: RE | Admit: 2017-03-18 | Discharge: 2017-03-18 | Disposition: A | Discharge status: Home / Self Care | Payer: Self-pay | Source: Ambulatory Visit | Attending: Cardiovascular Disease | Admitting: Cardiovascular Disease

## 2017-03-20 ENCOUNTER — Encounter (HOSPITAL_COMMUNITY)
Admission: RE | Admit: 2017-03-20 | Discharge: 2017-03-20 | Disposition: A | Discharge status: Home / Self Care | Payer: Self-pay | Source: Ambulatory Visit | Attending: Cardiovascular Disease | Admitting: Cardiovascular Disease

## 2017-03-21 ENCOUNTER — Other Ambulatory Visit: Payer: Self-pay | Admitting: *Deleted

## 2017-03-21 ENCOUNTER — Ambulatory Visit: Payer: 59 | Admitting: Internal Medicine

## 2017-03-21 VITALS — BP 114/72 | HR 52 | Temp 97.9°F | Resp 16 | Ht 67.5 in | Wt 195.2 lb

## 2017-03-21 DIAGNOSIS — Z0000 Encounter for general adult medical examination without abnormal findings: Secondary | ICD-10-CM

## 2017-03-21 DIAGNOSIS — Z1212 Encounter for screening for malignant neoplasm of rectum: Secondary | ICD-10-CM

## 2017-03-21 DIAGNOSIS — Z125 Encounter for screening for malignant neoplasm of prostate: Secondary | ICD-10-CM

## 2017-03-21 DIAGNOSIS — J042 Acute laryngotracheitis: Secondary | ICD-10-CM

## 2017-03-21 DIAGNOSIS — R5383 Other fatigue: Secondary | ICD-10-CM

## 2017-03-21 DIAGNOSIS — Z0001 Encounter for general adult medical examination with abnormal findings: Secondary | ICD-10-CM

## 2017-03-21 DIAGNOSIS — E559 Vitamin D deficiency, unspecified: Secondary | ICD-10-CM

## 2017-03-21 DIAGNOSIS — R7303 Prediabetes: Secondary | ICD-10-CM

## 2017-03-21 DIAGNOSIS — Z136 Encounter for screening for cardiovascular disorders: Secondary | ICD-10-CM | POA: Diagnosis not present

## 2017-03-21 DIAGNOSIS — Z79899 Other long term (current) drug therapy: Secondary | ICD-10-CM

## 2017-03-21 DIAGNOSIS — E782 Mixed hyperlipidemia: Secondary | ICD-10-CM | POA: Diagnosis not present

## 2017-03-21 DIAGNOSIS — Z8249 Family history of ischemic heart disease and other diseases of the circulatory system: Secondary | ICD-10-CM

## 2017-03-21 DIAGNOSIS — Z111 Encounter for screening for respiratory tuberculosis: Secondary | ICD-10-CM

## 2017-03-21 DIAGNOSIS — Z1211 Encounter for screening for malignant neoplasm of colon: Secondary | ICD-10-CM

## 2017-03-21 DIAGNOSIS — F419 Anxiety disorder, unspecified: Secondary | ICD-10-CM

## 2017-03-21 DIAGNOSIS — I10 Essential (primary) hypertension: Secondary | ICD-10-CM | POA: Diagnosis not present

## 2017-03-21 DIAGNOSIS — Z955 Presence of coronary angioplasty implant and graft: Secondary | ICD-10-CM

## 2017-03-21 DIAGNOSIS — E349 Endocrine disorder, unspecified: Secondary | ICD-10-CM

## 2017-03-21 DIAGNOSIS — I2510 Atherosclerotic heart disease of native coronary artery without angina pectoris: Secondary | ICD-10-CM

## 2017-03-21 DIAGNOSIS — N184 Chronic kidney disease, stage 4 (severe): Secondary | ICD-10-CM

## 2017-03-21 MED ORDER — CLOPIDOGREL BISULFATE 75 MG PO TABS: 75.0000 mg | ORAL_TABLET | Freq: Every day | ORAL | 3 refills | Status: DC

## 2017-03-21 MED ORDER — ATENOLOL 100 MG PO TABS: 100.0000 mg | ORAL_TABLET | Freq: Every day | ORAL | 3 refills | Status: DC

## 2017-03-21 MED ORDER — FENOFIBRATE 160 MG PO TABS: 160.0000 mg | ORAL_TABLET | Freq: Every day | ORAL | 3 refills | Status: DC

## 2017-03-21 MED ORDER — DILTIAZEM HCL ER COATED BEADS 240 MG PO CP24: ORAL_CAPSULE | ORAL | 3 refills | Status: DC

## 2017-03-21 MED ORDER — BUPROPION HCL ER (XL) 300 MG PO TB24: ORAL_TABLET | ORAL | 3 refills | Status: DC

## 2017-03-21 MED ORDER — AZITHROMYCIN 250 MG PO TABS: ORAL_TABLET | ORAL | 1 refills | Status: DC

## 2017-03-21 MED ORDER — ALPRAZOLAM 1 MG PO TABS: ORAL_TABLET | ORAL | 0 refills | Status: DC

## 2017-03-21 MED ORDER — DOXAZOSIN MESYLATE 8 MG PO TABS: ORAL_TABLET | ORAL | 3 refills | Status: DC

## 2017-03-21 MED ORDER — ATORVASTATIN CALCIUM 40 MG PO TABS: ORAL_TABLET | ORAL | 0 refills | Status: DC

## 2017-03-21 MED ORDER — MELOXICAM 15 MG PO TABS: ORAL_TABLET | ORAL | 0 refills | Status: DC

## 2017-03-21 MED ORDER — PREDNISONE 20 MG PO TABS: ORAL_TABLET | ORAL | 0 refills | Status: DC

## 2017-03-21 MED ORDER — FUROSEMIDE 80 MG PO TABS: ORAL_TABLET | ORAL | 3 refills | Status: DC

## 2017-03-21 NOTE — Patient Instructions (Signed)

## 2017-03-21 NOTE — Progress Notes (Signed)
[facility]   Unk Pinto, M.D.     Uvaldo Bristle. Silverio Lay, P.A.-C Liane Comber, [place]                [address] [place] [address] Telephone [phone] Telefax [phone] Annual  Screening/Preventative Visit  & Comprehensive Evaluation & Examination     This very nice 62 y.o. MWM presents for a Screening/Preventative Visit & comprehensive evaluation and management of multiple medical co-morbidities.  Patient has been followed for HTN, Prediabetes, Hyperlipidemia and Vitamin D Deficiency.     HTN predates circa 1989. Patient's BP has been controlled at home.  Today's BP is at goal - 114/72. Patient had a negative heart cath in 2009 and then presented in 2012 with ACSundergoing PCA/DES. Patient continues out-patient cardiac rehab 3 x/week. Patient has CKD3 attributed to HTN  Patient denies any cardiac symptoms as chest pain, palpitations, shortness of breath, dizziness or ankle swelling.     Patient's hyperlipidemia is controlled with diet and medications. Patient denies myalgias or other medication SE's. Last lipids were  Lab Results  Component Value Date   CHOL 152 09/27/2016   HDL 54 09/27/2016   LDLCALC 77 09/27/2016   TRIG 105 09/27/2016   CHOLHDL 2.8 09/27/2016      Patient has prediabetes A1c 5.8% / 2008) and patient denies reactive hypoglycemic symptoms, visual blurring, diabetic polys or paresthesias. Last A1c was Normal & at goal: Lab Results  Component Value Date   HGBA1C 5.1 02/10/2016       Finally, patient has history of Vitamin D Deficiency ("19"/2009) and last vitamin D was at goal: Lab Results  Component Value Date   VD25OH 73 02/10/2016   Current Outpatient Medications on File Prior to Visit  Medication Sig  . ALPRAZolam (XANAX) 1 MG tablet TAKE 1/2 TO 1 TABLET BY MOUTH THREE TIMES DAILY AS NEEDED FOR ANXIETY  . aspirin 81 MG tablet Take 81 mg by mouth  daily.  Marland Kitchen atenolol (TENORMIN) 100 MG tablet Take 1 tablet (100 mg total) by mouth daily.  Marland Kitchen atorvastatin (LIPITOR) 40 MG tablet TAKE 1 TABLET BY MOUTH EVERY DAY FOR CHOLESTEROL  . b complex vitamins tablet Take 1 tablet by mouth daily.  . B-D 3CC LUER-LOK SYR 21GX1" 21G X 1" 3 ML MISC USE WITH TESTOSTERONE AS DIRECTED  . buPROPion (WELLBUTRIN XL) 300 MG 24 hr tablet TAKE 1 TABLET BY MOUTH  DAILY FOR MOOD  . clopidogrel (PLAVIX) 75 MG tablet Take 1 tablet (75 mg total) by mouth daily.  Marland Kitchen diltiazem (CARDIZEM CD) 240 MG 24 hr capsule TAKE 1 CAPSULE BY MOUTH  DAILY FOR BLOOD PRESSURE  . doxazosin (CARDURA) 8 MG tablet TAKE 1/2 TO 1 TABLET DAILY FOR BP & PROSTATE  . fenofibrate 160 MG tablet Take 1 tablet (160 mg total) by mouth daily.  . fish oil-omega-3 fatty acids 1000 MG capsule Take 1 g by mouth daily. Takes 3 gm daily  . furosemide (LASIX) 80 MG tablet TAKE 1 TABLET BY MOUTH EVERY DAY FOR BLOOD PRESSURE AND FLUID  . HYDROcodone-acetaminophen (NORCO) 10-325 MG per tablet   . meloxicam (MOBIC) 15 MG tablet Take 1/2 to 1 tablet daily at lowest dose to avoid kidney damage  . Multiple Vitamin (MULTIVITAMIN WITH MINERALS) TABS Take 2 tablets by mouth daily.   . nitroGLYCERIN (NITROSTAT) 0.4 MG SL tablet PLACE  1 TABLET (0.4 MG TOTAL) UNDER THE TONGUE EVERY 5 (FIVE) MINUTES AS NEEDED FOR CHEST PAIN.  Marland Kitchen olmesartan (BENICAR) 40 MG tablet TAKE 1 TABLET BY MOUTH  DAILY FOR BLOOD PRESSURE (Patient taking differently: TAKE half tABLET BY MOUTH  DAILY FOR BLOOD PRESSURE)  . testosterone cypionate (DEPOTESTOSTERONE CYPIONATE) 200 MG/ML injection INJECT 2 ML IM ONCE EVERY 12 DAYS   No current facility-administered medications on file prior to visit.    Allergies  Allergen Reactions  . Hctz [Hydrochlorothiazide]   . Tape Rash    Clear tape   Past Medical History:  Diagnosis Date  . Anxiety   . Arthritis   . Atrial flutter, paroxysmal ([place]) 01/04/2011  . CAD (coronary artery disease), native coronary  artery 01/04/2011  . Diverticulitis   . Diverticulosis   . GERD (gastroesophageal reflux disease)   . Hemarthrosis of knee, right 01/04/2011  . Hemorrhoids   . History of meniscectomy of right knee 01/02/2011  . Hyperlipemia 01/04/2011  . Hyperlipidemia   . Hypertension   . Meniscus tear 01/02/2011  . NSVT (nonsustained ventricular tachycardia) ([place]) 01/02/2011  . Right knee pain 01/02/2011  . Stented coronary artery 01/04/2011   LAD DES [place].  . Torn medial meniscus    right knee   Health Maintenance  Topic Date Due  . INFLUENZA VACCINE  09/19/2016  . COLONOSCOPY  10/25/2020  . TETANUS/TDAP  02/28/2025  . Hepatitis C Screening  Completed  . HIV Screening  Completed   Immunization History  Administered Date(s) Administered  . Influenza Split 12/16/2013, 12/02/2014  . Influenza Whole 12/09/2012  . Influenza, Seasonal, Injecte, Preservative Fre 12/01/2015  . PPD Test 11/05/2013, 01/04/2015, 02/10/2016  . Td 02/19/2005  . Tdap 03/01/2015   Last Colon -  Past Surgical History:  Procedure Laterality Date  . APPENDECTOMY    . bicept tendon repair     bilateral  . CORONARY ANGIOPLASTY WITH STENT PLACEMENT  2012  . HAND SURGERY     right  . KNEE ARTHROSCOPY     x 8 left  . KNEE ARTHROSCOPY Right 04/21/2012   Procedure: ARTHROSCOPY KNEE;  Surgeon: Lorn Junes, MD;  Location: [facility];  Service: Orthopedics;  Laterality: Right;  medial and lateral meniscectomies  . LEFT HEART CATHETERIZATION WITH CORONARY ANGIOGRAM N/A 01/03/2011   Procedure: LEFT HEART CATHETERIZATION WITH CORONARY ANGIOGRAM;  Surgeon: Lorretta Harp, MD;  Location: [facility] CATH LAB;  Service: Cardiovascular;  Laterality: N/A;  . PERCUTANEOUS CORONARY STENT INTERVENTION (PCI-S) N/A 01/03/2011   Procedure: PERCUTANEOUS CORONARY STENT INTERVENTION (PCI-S);  Surgeon: Lorretta Harp, MD;  Location: [facility] CATH LAB;  Service: Cardiovascular;  Laterality: N/A;  . ROTATOR CUFF REPAIR     bilateral  . TONSILLECTOMY      Family History  Problem Relation Age of Onset  . Diabetes Mother   . Heart disease Mother   . Heart attack Mother   . Heart disease Father   . Coronary artery disease Father   . Obesity Brother   . Diabetes Brother   . HIV/AIDS Brother   . Hypertension Brother    Social History   Socioeconomic History  . Marital status: Married    Spouse name: Not on file  . Number of children: 2  . Years of education: Not on file  . Highest education level: Not on file  Occupational History  . Occupation: Self Employed - [employer]  Tobacco Use  . Smoking status: Never Smoker  . Smokeless tobacco: Never Used  Substance and Sexual Activity  . Alcohol use: Yes    Alcohol/week: 6.0 oz    Types: 10 Cans of beer per week    Comment: 1-2 beers a night  . Drug use: No  . Sexual activity: Active    ROS Constitutional: Denies fever, chills, weight loss/gain, headaches, insomnia,  night sweats or change in appetite. Does c/o fatigue. Eyes: Denies redness, blurred vision, diplopia, discharge, itchy or watery eyes.  ENT: Denies discharge, congestion, post nasal drip, epistaxis, sore throat, earache, hearing loss, dental pain, Tinnitus, Vertigo, Sinus pain or snoring.  Cardio: Denies chest pain, palpitations, irregular heartbeat, syncope, dyspnea, diaphoresis, orthopnea, PND, claudication or edema Respiratory: denies cough, dyspnea, DOE, pleurisy, hoarseness, laryngitis or wheezing.  Gastrointestinal: Denies dysphagia, heartburn, reflux, water brash, pain, cramps, nausea, vomiting, bloating, diarrhea, constipation, hematemesis, melena, hematochezia, jaundice or hemorrhoids Genitourinary: Denies dysuria, frequency, urgency, nocturia, hesitancy, discharge, hematuria or flank pain Musculoskeletal: Denies arthralgia, myalgia, stiffness, Jt. Swelling, pain, limp or strain/sprain. Denies Falls. Skin: Denies puritis, rash, hives, warts, acne, eczema or change in skin lesion Neuro: No weakness,  tremor, incoordination, spasms, paresthesia or pain Psychiatric: Denies confusion, memory loss or sensory loss. Denies Depression. Endocrine: Denies change in weight, skin, hair change, nocturia, and paresthesia, diabetic polys, visual blurring or hyper / hypo glycemic episodes.  Heme/Lymph: No excessive bleeding, bruising or enlarged lymph nodes.  Physical Exam  BP 114/72   Pulse (!) 52   Temp 97.9 F (36.6 C)   Resp 16   Ht 5' 7.5" (1.715 m)   Wt 195 lb 3.2 oz (88.5 kg)   BMI 30.12 kg/m   General Appearance: Well nourished and well groomed and in no apparent distress.  Eyes: PERRLA, EOMs, conjunctiva no swelling or erythema, normal fundi and vessels. Sinuses: No frontal/maxillary tenderness ENT/Mouth: EACs patent / TMs  nl. Nares clear without erythema, swelling, mucoid exudates. Oral hygiene is good. No erythema, swelling, or exudate. Tongue normal, non-obstructing. Tonsils not swollen or erythematous. Hearing normal.  Neck: Supple, thyroid normal. No bruits, nodes or JVD. Respiratory: Respiratory effort normal.  BS equal and clear bilateral without rales, rhonci, wheezing or stridor. Cardio: Heart sounds are normal with regular rate and rhythm and no murmurs, rubs or gallops. Peripheral pulses are normal and equal bilaterally without edema. No aortic or femoral bruits. Chest: symmetric with normal excursions and percussion.  Abdomen: Soft, with Nl bowel sounds. Nontender, no guarding, rebound, hernias, masses, or organomegaly.  Lymphatics: Non tender without lymphadenopathy.  Genitourinary: No hernias.Testes nl. DRE - prostate nl for age - smooth & firm w/o nodules. Musculoskeletal: Full ROM all peripheral extremities, joint stability, 5/5 strength, and normal gait. Skin: Warm and dry without rashes, lesions, cyanosis, clubbing or  ecchymosis.  Neuro: Cranial nerves intact, reflexes equal bilaterally. Normal muscle tone, no cerebellar symptoms. Sensation intact.  Pysch: Alert and  oriented X 3 with normal affect, insight and judgment appropriate.   Assessment and Plan  1. Annual Preventative/Screening Exam   1. Encounter for general adult medical examination with abnormal findings   2. Essential hypertension  - EKG 12-Lead - [place], RETROPERITNL ABD,  LTD - Urinalysis, Routine w reflex microscopic - Microalbumin / creatinine urine ratio - CBC with Differential/Platelet - BASIC METABOLIC PANEL WITH GFR - Magnesium - TSH  3. Hyperlipidemia, mixed  - EKG 12-Lead - [place], RETROPERITNL ABD,  LTD - Hepatic function panel - Lipid panel - TSH  4. Prediabetes  - EKG 12-Lead - [place], RETROPERITNL ABD,  LTD - Hemoglobin A1c -  Insulin, random  5. Vitamin D deficiency  - VITAMIN D 25 Hydroxy (Vit-D Deficiency, Fractures)  6. Testosterone deficiency  - Testosterone  7. Coronary artery disease involving native coronary artery, angina presence unspecified, unspecified whether native or transplanted heart  - EKG 12-Lead  8. Stented coronary artery   9. Chronic kidney disease, stage 4 (severe) (HCC)   10. Screening for colorectal cancer   11. Family history of ischemic heart disease  - EKG 12-Lead - [place], RETROPERITNL ABD,  LTD  12. Prostate cancer screening  - PSA  13. Screening for ischemic heart disease  - EKG 12-Lead  14. Screening for AAA (aortic abdominal aneurysm)  - [place], RETROPERITNL ABD,  LTD  15. Fatigue, unspecified type  - Iron,Total/Total Iron Binding Cap - Vitamin B12 - Testosterone - CBC with Differential/Platelet - BASIC METABOLIC PANEL WITH GFR - TSH  16. Medication management  - Urinalysis, Routine w reflex microscopic - Microalbumin / creatinine urine ratio - Testosterone - CBC with Differential/Platelet - BASIC METABOLIC PANEL WITH GFR - Hepatic function panel - Magnesium - Lipid panel - TSH - Hemoglobin A1c - Insulin, random - VITAMIN D 25 Hydroxy (Vit-D Deficiency, Fractures)  17. Anxiety tension  state  - ALPRAZolam (XANAX) 1 MG tablet; Take 1/2 to 1 tablet 2 to 3 x / day Only if needed for Anxiety Attack and please try to limit to 5 days /week to avoid addiction  Dispense: 90 tablet; Refill: 0  18. Laryngotracheitis  - predniSONE (DELTASONE) 20 MG tablet; 1 tab 3 x day for 3 days, then 1 tab 2 x day for 3 days, then 1 tab 1 x day for 5 days  Dispense: 20 tablet; Refill: 0 - azithromycin (ZITHROMAX) 250 MG tablet; Take 2 tablets (500 mg) on  Day 1,  followed by 1 tablet (250 mg) once daily on Days 2 through 5.  Dispense: 6 each; Refill: 1  19. Screening examination for pulmonary tuberculosis  - PPD      Patient was counseled in prudent diet, weight control to achieve/maintain BMI less than 25, BP monitoring, regular exercise and medications as discussed.  Discussed med effects and SE's. Routine screening labs and tests as requested with regular follow-up as recommended. Over 40 minutes of exam, counseling, chart review and high complex critical decision making was performed

## 2017-03-22 ENCOUNTER — Encounter (INDEPENDENT_AMBULATORY_CARE_PROVIDER_SITE_OTHER): Payer: Self-pay

## 2017-03-22 ENCOUNTER — Encounter (HOSPITAL_COMMUNITY)
Admission: RE | Admit: 2017-03-22 | Discharge: 2017-03-22 | Disposition: A | Discharge status: Home / Self Care | Payer: Self-pay | Source: Ambulatory Visit | Attending: Cardiovascular Disease | Admitting: Cardiovascular Disease

## 2017-03-22 DIAGNOSIS — Z955 Presence of coronary angioplasty implant and graft: Secondary | ICD-10-CM | POA: Insufficient documentation

## 2017-03-22 LAB — CBC WITH DIFFERENTIAL/PLATELET
BASOS ABS: 30 {cells}/uL (ref 0–200)
Basophils Relative: 0.6 %
EOS PCT: 4 %
Eosinophils Absolute: 200 cells/uL (ref 15–500)
HCT: 41.3 % (ref 38.5–50.0)
Hemoglobin: 14.4 g/dL (ref 13.2–17.1)
Lymphs Abs: 1665 cells/uL (ref 850–3900)
MCH: 33.5 pg — ABNORMAL HIGH (ref 27.0–33.0)
MCHC: 34.9 g/dL (ref 32.0–36.0)
MCV: 96 fL (ref 80.0–100.0)
MONOS PCT: 10.4 %
MPV: 10.2 fL (ref 7.5–12.5)
NEUTROS ABS: 2585 {cells}/uL (ref 1500–7800)
NEUTROS PCT: 51.7 %
Platelets: 208 10*3/uL (ref 140–400)
RBC: 4.3 10*6/uL (ref 4.20–5.80)
RDW: 12.8 % (ref 11.0–15.0)
Total Lymphocyte: 33.3 %
WBC mixed population: 520 cells/uL (ref 200–950)
WBC: 5 10*3/uL (ref 3.8–10.8)

## 2017-03-22 LAB — URINALYSIS, ROUTINE W REFLEX MICROSCOPIC
Bilirubin Urine: NEGATIVE
GLUCOSE, UA: NEGATIVE
Hgb urine dipstick: NEGATIVE
Ketones, ur: NEGATIVE
LEUKOCYTES UA: NEGATIVE
Nitrite: NEGATIVE
PH: 6 (ref 5.0–8.0)
Protein, ur: NEGATIVE
SPECIFIC GRAVITY, URINE: 1.01 (ref 1.001–1.03)

## 2017-03-22 LAB — HEMOGLOBIN A1C
EAG (MMOL/L): 5.5 (calc)
Hgb A1c MFr Bld: 5.1 % of total Hgb (ref <5.7)
Mean Plasma Glucose: 100 (calc)

## 2017-03-22 LAB — HEPATIC FUNCTION PANEL
AG Ratio: 1.7 (calc) (ref 1.0–2.5)
ALKALINE PHOSPHATASE (APISO): 40 U/L (ref 40–115)
ALT: 25 U/L (ref 9–46)
AST: 31 U/L (ref 10–35)
Albumin: 4.5 g/dL (ref 3.6–5.1)
BILIRUBIN TOTAL: 0.7 mg/dL (ref 0.2–1.2)
Bilirubin, Direct: 0.1 mg/dL (ref 0.0–0.2)
Globulin: 2.7 g/dL (calc) (ref 1.9–3.7)
Indirect Bilirubin: 0.6 mg/dL (calc) (ref 0.2–1.2)
TOTAL PROTEIN: 7.2 g/dL (ref 6.1–8.1)

## 2017-03-22 LAB — BASIC METABOLIC PANEL WITH GFR
BUN/Creatinine Ratio: 20 (calc) (ref 6–22)
BUN: 30 mg/dL — ABNORMAL HIGH (ref 7–25)
CO2: 27 mmol/L (ref 20–32)
Calcium: 10.1 mg/dL (ref 8.6–10.3)
Chloride: 103 mmol/L (ref 98–110)
Creat: 1.49 mg/dL — ABNORMAL HIGH (ref 0.70–1.25)
GFR, Est African American: 57 mL/min/{1.73_m2} — ABNORMAL LOW (ref >=60)
GFR, Est Non African American: 50 mL/min/{1.73_m2} — ABNORMAL LOW (ref >=60)
GLUCOSE: 95 mg/dL (ref 65–99)
POTASSIUM: 4.2 mmol/L (ref 3.5–5.3)
SODIUM: 139 mmol/L (ref 135–146)

## 2017-03-22 LAB — IRON, TOTAL/TOTAL IRON BINDING CAP
%SAT: 39 % (ref 15–60)
Iron: 129 ug/dL (ref 50–180)
TIBC: 335 ug/dL (ref 250–425)

## 2017-03-22 LAB — MICROALBUMIN / CREATININE URINE RATIO
Creatinine, Urine: 56 mg/dL (ref 20–320)
Microalb, Ur: <0.2 mg/dL

## 2017-03-22 LAB — INSULIN, RANDOM: INSULIN: 6.1 u[IU]/mL (ref 2.0–19.6)

## 2017-03-22 LAB — LIPID PANEL
CHOLESTEROL: 174 mg/dL (ref <200)
HDL: 61 mg/dL (ref >40)
LDL Cholesterol (Calc): 93 mg/dL (calc)
Non-HDL Cholesterol (Calc): 113 mg/dL (calc) (ref <130)
Total CHOL/HDL Ratio: 2.9 (calc) (ref <5.0)
Triglycerides: 103 mg/dL (ref <150)

## 2017-03-22 LAB — MAGNESIUM: Magnesium: 1.8 mg/dL (ref 1.5–2.5)

## 2017-03-22 LAB — TSH: TSH: 0.99 mIU/L (ref 0.40–4.50)

## 2017-03-22 LAB — PSA: PSA: 0.9 ng/mL (ref <=4.0)

## 2017-03-22 LAB — VITAMIN D 25 HYDROXY (VIT D DEFICIENCY, FRACTURES): VIT D 25 HYDROXY: 82 ng/mL (ref 30–100)

## 2017-03-22 LAB — VITAMIN B12: Vitamin B-12: >2000 pg/mL — ABNORMAL HIGH (ref 200–1100)

## 2017-03-22 LAB — TESTOSTERONE: Testosterone: 252 ng/dL (ref 250–827)

## 2017-03-23 ENCOUNTER — Encounter: Payer: Self-pay | Admitting: Internal Medicine

## 2017-03-25 ENCOUNTER — Encounter (HOSPITAL_COMMUNITY): Payer: Self-pay

## 2017-03-27 ENCOUNTER — Encounter (HOSPITAL_COMMUNITY): Payer: Self-pay

## 2017-03-29 ENCOUNTER — Encounter (HOSPITAL_COMMUNITY): Payer: Self-pay

## 2017-04-01 ENCOUNTER — Encounter (HOSPITAL_COMMUNITY)
Admission: RE | Admit: 2017-04-01 | Discharge: 2017-04-01 | Disposition: A | Discharge status: Home / Self Care | Payer: Self-pay | Source: Ambulatory Visit | Attending: Cardiovascular Disease | Admitting: Cardiovascular Disease

## 2017-04-03 ENCOUNTER — Encounter (HOSPITAL_COMMUNITY): Payer: Self-pay

## 2017-04-03 ENCOUNTER — Encounter (HOSPITAL_COMMUNITY): Payer: Self-pay | Admitting: Physician Assistant

## 2017-04-03 DIAGNOSIS — M1711 Unilateral primary osteoarthritis, right knee: Secondary | ICD-10-CM

## 2017-04-03 DIAGNOSIS — M170 Bilateral primary osteoarthritis of knee: Secondary | ICD-10-CM | POA: Diagnosis not present

## 2017-04-03 HISTORY — DX: Unilateral primary osteoarthritis, right knee: M17.11

## 2017-04-03 NOTE — H&P (Signed)
TOTAL KNEE ADMISSION H&P  Patient is being admitted for right total knee arthroplasty.  Subjective:  Chief Complaint:right knee pain.  HPI: Marvin Carter, 62 y.o. male, has a history of pain and functional disability in the right knee due to arthritis and has failed non-surgical conservative treatments for greater than 12 weeks to includeNSAID's and/or analgesics, corticosteriod injections, viscosupplementation injections, flexibility and strengthening excercises, supervised PT with diminished ADL's post treatment, weight reduction as appropriate and activity modification.  Onset of symptoms was gradual, starting 10 years ago with gradually worsening course since that time. The patient noted prior procedures on the knee to include  arthroscopy and menisectomy on the right knee(s).  Patient currently rates pain in the right knee(s) at 10 out of 10 with activity. Patient has night pain, worsening of pain with activity and weight bearing, pain that interferes with activities of daily living, crepitus and joint swelling.  Patient has evidence of subchondral sclerosis, periarticular osteophytes and joint space narrowing by imaging studies.There is no active infection.  Patient Active Problem List   Diagnosis Date Noted  . Primary osteoarthritis of right knee 04/03/2017  . Drug-induced erectile dysfunction 03/01/2015  . BMI 30.0-30.9,adult 01/04/2015  . Prediabetes 04/30/2013  . Vitamin D deficiency 04/30/2013  . Medication management 04/30/2013  . Testosterone deficiency 04/30/2013  . Hypertension   . Mixed hyperlipidemia   . GERD (gastroesophageal reflux disease)   . Anxiety   . Diverticulosis   . Atrial flutter, paroxysmal ([place]) 01/04/2011  . CAD (coronary artery disease), native coronary artery 01/04/2011  . Stented coronary artery 01/04/2011  . NSVT (nonsustained ventricular tachycardia) ([place]) 01/02/2011  . S/P right knee arthroscopy 01/02/2011  . History of meniscectomy of right knee  01/02/2011  . Hemorrhoids, internal, with bleeding 11/08/2010  . Prolapsed external hemorrhoids 10/19/2010   Past Medical History:  Diagnosis Date  . Anxiety   . Arthritis   . Atrial flutter, paroxysmal ([place]) 01/04/2011  . CAD (coronary artery disease), native coronary artery 01/04/2011  . Diverticulitis   . Diverticulosis   . GERD (gastroesophageal reflux disease)   . Hemarthrosis of knee, right 01/04/2011  . Hemorrhoids   . History of meniscectomy of right knee 01/02/2011  . Hyperlipemia 01/04/2011  . Hyperlipidemia   . Hypertension   . Meniscus tear 01/02/2011  . NSVT (nonsustained ventricular tachycardia) ([place]) 01/02/2011  . Primary osteoarthritis of right knee 04/03/2017  . Right knee pain 01/02/2011  . Stented coronary artery 01/04/2011   LAD DES [place].  . Torn medial meniscus    right knee    Past Surgical History:  Procedure Laterality Date  . APPENDECTOMY    . bicept tendon repair     bilateral  . CORONARY ANGIOPLASTY WITH STENT PLACEMENT  2012  . HAND SURGERY     right  . KNEE ARTHROSCOPY     x 8 left  . KNEE ARTHROSCOPY Right 04/21/2012   Procedure: ARTHROSCOPY KNEE;  Surgeon: Lorn Junes, MD;  Location: [facility];  Service: Orthopedics;  Laterality: Right;  medial and lateral meniscectomies  . LEFT HEART CATHETERIZATION WITH CORONARY ANGIOGRAM N/A 01/03/2011   Procedure: LEFT HEART CATHETERIZATION WITH CORONARY ANGIOGRAM;  Surgeon: Lorretta Harp, MD;  Location: [facility] CATH LAB;  Service: Cardiovascular;  Laterality: N/A;  . PERCUTANEOUS CORONARY STENT INTERVENTION (PCI-S) N/A 01/03/2011   Procedure: PERCUTANEOUS CORONARY STENT INTERVENTION (PCI-S);  Surgeon: Lorretta Harp, MD;  Location: [facility] C[facility] LAB;  Service: Cardiovascular;  Laterality: N/A;  . ROTATOR CUFF REPAIR  bilateral  . TONSILLECTOMY      No current facility-administered medications for this encounter.    Current Outpatient Medications  Medication Sig Dispense Refill Last Dose  .  ALPRAZolam (XANAX) 1 MG tablet Take 1/2 to 1 tablet 2 to 3 x / day Only if needed for Anxiety Attack and please try to limit to 5 days /week to avoid addiction (Patient taking differently: Take 1 mg by mouth at bedtime. Take 1/2 to 1 tablet 2 to 3 x / day Only if needed for Anxiety Attack and please try to limit to 5 days /week to avoid addiction) 90 tablet 0 04/02/2017 at Unknown time  . Ascorbic Acid (VITAMIN C PO) Take 1 tablet by mouth daily as needed (for immune system support).   04/03/2017 at Unknown time  . aspirin EC 81 MG tablet Take 81 mg by mouth daily.   04/03/2017 at Unknown time  . atenolol (TENORMIN) 100 MG tablet Take 1 tablet (100 mg total) by mouth daily. 90 tablet 3 04/03/2017 at Unknown time  . atorvastatin (LIPITOR) 40 MG tablet TAKE 1 TABLET BY MOUTH EVERY DAY FOR CHOLESTEROL (Patient taking differently: Take 40 mg by mouth every evening. TAKE 1 TABLET BY MOUTH EVERY DAY FOR CHOLESTEROL) 90 tablet 0 04/03/2017 at Unknown time  . B Complex-C (B-COMPLEX WITH VITAMIN C) tablet Take 1 tablet by mouth daily.   04/03/2017 at Unknown time  . buPROPion (WELLBUTRIN XL) 300 MG 24 hr tablet TAKE 1 TABLET BY MOUTH  DAILY FOR MOOD (Patient taking differently: Take 300 mg by mouth daily. TAKE 1 TABLET BY MOUTH  DAILY FOR MOOD) 90 tablet 3 04/03/2017 at Unknown time  . clopidogrel (PLAVIX) 75 MG tablet Take 1 tablet (75 mg total) by mouth daily. 90 tablet 3 04/03/2017 at Unknown time  . Cyanocobalamin (B-12 PO) Take 1 tablet by mouth daily.   04/03/2017 at Unknown time  . diclofenac sodium (VOLTAREN) 1 % GEL Apply 2-4 g topically 4 (four) times daily as needed (for knee pain.).   04/03/2017 at Unknown time  . diltiazem (CARDIZEM CD) 240 MG 24 hr capsule TAKE 1 CAPSULE BY MOUTH  DAILY FOR BLOOD PRESSURE (Patient taking differently: Take 240 mg by mouth daily. TAKE 1 CAPSULE BY MOUTH  DAILY FOR BLOOD PRESSURE) 90 capsule 3 04/03/2017 at Unknown time  . doxazosin (CARDURA) 8 MG tablet TAKE 1/2 TO 1 TABLET DAILY  FOR BP & PROSTATE (Patient taking differently: Take 4 mg by mouth every evening. ) 90 tablet 3 04/03/2017 at Unknown time  . fenofibrate 160 MG tablet Take 1 tablet (160 mg total) by mouth daily. (Patient taking differently: Take 80 mg by mouth daily. ) 90 tablet 3 04/03/2017 at Unknown time  . fish oil-omega-3 fatty acids 1000 MG capsule Take 3 g by mouth daily. Takes 3 gm daily   04/03/2017 at Unknown time  . furosemide (LASIX) 80 MG tablet TAKE 1 TABLET BY MOUTH EVERY DAY FOR BLOOD PRESSURE AND FLUID (Patient taking differently: Take 80 mg by mouth daily. TAKE 1 TABLET BY MOUTH EVERY DAY FOR BLOOD PRESSURE AND FLUID) 90 tablet 3 04/03/2017 at Unknown time  . HYDROcodone-acetaminophen (NORCO) 10-325 MG per tablet Take 0.5-1 tablets by mouth every 6 (six) hours as needed (for severe pain.).   0 Past Week at Unknown time  . meloxicam (MOBIC) 15 MG tablet Take 1/2 to 1 tablet daily at lowest dose to avoid kidney damage (Patient taking differently: Take 7.5 mg by mouth daily as needed (  for pain.). Take 1/2 to 1 tablet daily at lowest dose to avoid kidney damage) 90 tablet 0 04/03/2017 at Unknown time  . Multiple Vitamin (MULTIVITAMIN WITH MINERALS) TABS Take 2 tablets by mouth daily. Mega Men Multivitamin.   04/03/2017 at Unknown time  . olmesartan (BENICAR) 40 MG tablet TAKE 1 TABLET BY MOUTH  DAILY FOR BLOOD PRESSURE (Patient taking differently: TAKE half tABLET BY MOUTH  DAILY FOR BLOOD PRESSURE) 90 tablet 1 04/03/2017 at Unknown time  . pantoprazole (PROTONIX) 40 MG tablet Take 40 mg by mouth daily as needed.   04/03/2017 at Unknown time  . ranitidine (ZANTAC) 75 MG tablet Take 150 mg by mouth daily as needed for heartburn.   04/03/2017 at Unknown time  . testosterone cypionate (DEPOTESTOSTERONE CYPIONATE) 200 MG/ML injection INJECT 2 ML IM ONCE EVERY 12 DAYS (Patient taking differently: Inject 400 mg into the muscle every 14 (fourteen) days. INJECT 2 ML IM ONCE EVERY 12 DAYS) 10 mL 2 04/02/2017 at Unknown time   . VITAMIN E PO Take 1 capsule by mouth daily.   04/03/2017 at Unknown time  . azithromycin (ZITHROMAX) 250 MG tablet Take 2 tablets (500 mg) on  Day 1,  followed by 1 tablet (250 mg) once daily on Days 2 through 5. (Patient not taking: Reported on 04/01/2017) 6 each 1 Not Taking at Unknown time  . B-D 3CC LUER-LOK SYR 21GX1" 21G X 1" 3 ML MISC USE WITH TESTOSTERONE AS DIRECTED 50 each 1 Taking  . nitroGLYCERIN (NITROSTAT) 0.4 MG SL tablet PLACE 1 TABLET (0.4 MG TOTAL) UNDER THE TONGUE EVERY 5 (FIVE) MINUTES AS NEEDED FOR CHEST PAIN. 25 tablet 1 Unknown at Unknown time  . predniSONE (DELTASONE) 20 MG tablet 1 tab 3 x day for 3 days, then 1 tab 2 x day for 3 days, then 1 tab 1 x day for 5 days (Patient not taking: Reported on 04/01/2017) 20 tablet 0 Not Taking at Unknown time   Allergies  Allergen Reactions  . Hctz [Hydrochlorothiazide]   . Tape Rash    Clear tape    Social History   Tobacco Use  . Smoking status: Never Smoker  . Smokeless tobacco: Never Used  Substance Use Topics  . Alcohol use: Yes    Alcohol/week: 6.0 oz    Types: 10 Cans of beer per week    Comment: 1-2 beers a night    Family History  Problem Relation Age of Onset  . Diabetes Mother   . Heart disease Mother   . Heart attack Mother   . Heart disease Father   . Coronary artery disease Father   . Obesity Brother   . Diabetes Brother   . HIV/AIDS Brother   . Hypertension Brother      Review of Systems  Constitutional: Negative.   HENT: Negative.   Eyes: Negative.   Respiratory: Negative.   Cardiovascular: Negative.   Gastrointestinal: Negative.   Genitourinary: Negative.   Musculoskeletal: Positive for back pain and joint pain.  Skin: Negative.   Neurological: Negative.   Endo/Heme/Allergies: Negative.   Psychiatric/Behavioral: Negative.     Objective:  Physical Exam  Constitutional: He is oriented to person, place, and time. He appears well-developed and well-nourished.  HENT:  Head:  Normocephalic and atraumatic.  Mouth/Throat: Oropharynx is clear and moist.  Eyes: Conjunctivae are normal. Pupils are equal, round, and reactive to light.  Neck: Neck supple.  Cardiovascular: Normal rate and regular rhythm.  Respiratory: Effort normal.  GI: Soft.  Genitourinary:  Genitourinary Comments: Not pertinent to current symptomatology therefore not examined.  Musculoskeletal:  Examination of bilateral knees reveal pain bilaterally.  1+ crepitation.  1+ synovitis.  Range of motion 0-120 degrees.  Significant severe varus deformity bilaterally.  Knees are otherwise stable with normal patella tracking.  Vascular exam: Pulses are 2+ and symmetric.  Neurologic exam: Distal motor and sensory examination is within normal limits.    Neurological: He is alert and oriented to person, place, and time.  Skin: Skin is warm and dry.  Psychiatric: He has a normal mood and affect. His behavior is normal.    Vital signs in last 24 hours: Temp:  [97.8 F (36.6 C)] 97.8 F (36.6 C) (02/13 1400) Pulse Rate:  [58] 58 (02/13 1400) BP: (120)/(74) 120/74 (02/13 1400) SpO2:  [97 %] 97 % (02/13 1400) Weight:  [90.4 kg (199 lb 6.4 oz)] 90.4 kg (199 lb 6.4 oz) (02/13 1400)  Labs:   Estimated body mass index is 30.32 kg/m as calculated from the following:   Height as of this encounter: 5\' 8"  (1.727 m).   Weight as of this encounter: 90.4 kg (199 lb 6.4 oz).   Imaging Review Plain radiographs demonstrate severe degenerative joint disease of the right knee(s). The overall alignment issignificant varus. The bone quality appears to be good for age and reported activity level.  Assessment/Plan:  End stage arthritis, right knee  Principal Problem:   Primary osteoarthritis of right knee Active Problems:   NSVT (nonsustained ventricular tachycardia) ([facility])   Atrial flutter, paroxysmal ([facility])   CAD (coronary artery disease), native coronary artery   Stented coronary artery   Hypertension   Mixed  hyperlipidemia   GERD (gastroesophageal reflux disease)   Anxiety   The patient history, physical examination, clinical judgment of the provider and imaging studies are consistent with end stage degenerative joint disease of the right knee(s) and total knee arthroplasty is deemed medically necessary. The treatment options including medical management, injection therapy arthroscopy and arthroplasty were discussed at length. The risks and benefits of total knee arthroplasty were presented and reviewed. The risks due to aseptic loosening, infection, stiffness, patella tracking problems, thromboembolic complications and other imponderables were discussed. The patient acknowledged the explanation, agreed to proceed with the plan and consent was signed. Patient is being admitted for inpatient treatment for surgery, pain control, PT, OT, prophylactic antibiotics, VTE prophylaxis, progressive ambulation and ADL's and discharge planning. The patient is planning to be discharged home with home health services

## 2017-04-03 NOTE — Pre-Procedure Instructions (Signed)
Marvin Carter  04/03/2017      [id number] [place], [place] - [address] [place] [address] Phone: [phone] Fax: [phone]  [facility] [place], [place] - [address] [place] [address] [place] [address] [place] [address] Phone: [phone] Fax: [phone]  [id number] - [place], [place] - [address] [place] [address] Phone: [phone] Fax: [phone]    Your procedure is scheduled on April 15, 2017.  Report to [facility] Admitting at 530 AM.  Call this number if you have problems the morning of surgery:  [phone]   Remember:  Do not eat food or drink liquids after midnight.  Take these medicines the morning of surgery with A SIP OF WATER atenolol (tenormin), alprazolam (xanax)-if needed, bupropion (wellbutrin), diltiazem (cardizem), hydrocodone-acetaminophen (norco)-if needed, pantoprazole (protonix), ranitidine (zantac)-if needed.  Stop/continue plavix as instructed by your doctor.  7 days prior to surgery STOP taking any meloxicam (mobic), Aspirin (unless otherwise instructed by your surgeon), Aleve, Naproxen, Ibuprofen, Motrin, Advil, Goody's, BC's, all herbal medications, fish oil, and all vitamins  Continue all other medications as instructed by your physician except follow the above medication instructions before surgery   Do not wear jewelry.  Do not wear lotions, powders, or colognes, or deodorant.  Men may shave face and neck.  Do not bring valuables to the hospital.  [facility] is not responsible for any belongings or valuables.  Contacts, dentures or bridgework may not be worn into surgery.  Leave your suitcase in the car.  After surgery it may be brought to your room.  For patients admitted to the hospital, discharge time will be determined by your treatment team.  Patients  discharged the day of surgery will not be allowed to drive home.   Special instructions:  [facility]- Preparing For Surgery  Before surgery, you can play an important role. Because skin is not sterile, your skin needs to be as free of germs as possible. You can reduce the number of germs on your skin by washing with CHG (chlorahexidine gluconate) Soap before surgery.  CHG is an antiseptic cleaner which kills germs and bonds with the skin to continue killing germs even after washing.  Please do not use if you have an allergy to CHG or antibacterial soaps. If your skin becomes reddened/irritated stop using the CHG.  Do not shave (including legs and underarms) for at least 48 hours prior to first CHG shower. It is OK to shave your face.  Please follow these instructions carefully.   1. Shower the NIGHT BEFORE SURGERY and the MORNING OF SURGERY with CHG.   2. If you chose to wash your hair, wash your hair first as usual with your normal shampoo.  3. After you shampoo, rinse your hair and body thoroughly to remove the shampoo.  4. Use CHG as you would any other liquid soap. You can apply CHG directly to the skin and wash gently with a scrungie or a clean washcloth.   5. Apply the CHG Soap to your body ONLY FROM THE NECK DOWN.  Do not use on open wounds or open sores. Avoid contact with your eyes, ears, mouth and genitals (private parts). Wash Face and genitals (private parts)  with your normal soap.  6. Wash thoroughly, paying special attention to the  area where your surgery will be performed.  7. Thoroughly rinse your body with warm water from the neck down.  8. DO NOT shower/wash with your normal soap after using and rinsing off the CHG Soap.  9. Pat yourself dry with a CLEAN TOWEL.  10. Wear CLEAN PAJAMAS to bed the night before surgery, wear comfortable clothes the morning of surgery  11. Place CLEAN SHEETS on your bed the night of your first shower and DO NOT SLEEP WITH PETS.  Day  of Surgery: Do not apply any deodorants/lotions. Please wear clean clothes to the hospital/surgery center.    Please read over the following fact sheets that you were given. Pain Booklet, Coughing and Deep Breathing, MRSA Information and Surgical Site Infection Prevention

## 2017-04-04 ENCOUNTER — Encounter (HOSPITAL_COMMUNITY): Payer: Self-pay

## 2017-04-04 ENCOUNTER — Encounter (HOSPITAL_COMMUNITY)
Admission: RE | Admit: 2017-04-04 | Discharge: 2017-04-04 | Disposition: A | Discharge status: Home / Self Care | Payer: 59 | Source: Ambulatory Visit | Attending: Orthopedic Surgery | Admitting: Orthopedic Surgery

## 2017-04-04 ENCOUNTER — Other Ambulatory Visit: Payer: Self-pay

## 2017-04-04 DIAGNOSIS — I2510 Atherosclerotic heart disease of native coronary artery without angina pectoris: Secondary | ICD-10-CM | POA: Diagnosis not present

## 2017-04-04 DIAGNOSIS — Z9889 Other specified postprocedural states: Secondary | ICD-10-CM | POA: Insufficient documentation

## 2017-04-04 DIAGNOSIS — I4892 Unspecified atrial flutter: Secondary | ICD-10-CM | POA: Insufficient documentation

## 2017-04-04 DIAGNOSIS — Z79899 Other long term (current) drug therapy: Secondary | ICD-10-CM | POA: Diagnosis not present

## 2017-04-04 DIAGNOSIS — Z01812 Encounter for preprocedural laboratory examination: Secondary | ICD-10-CM | POA: Diagnosis not present

## 2017-04-04 DIAGNOSIS — N183 Chronic kidney disease, stage 3 (moderate): Secondary | ICD-10-CM | POA: Insufficient documentation

## 2017-04-04 DIAGNOSIS — I129 Hypertensive chronic kidney disease with stage 1 through stage 4 chronic kidney disease, or unspecified chronic kidney disease: Secondary | ICD-10-CM | POA: Insufficient documentation

## 2017-04-04 DIAGNOSIS — Z7982 Long term (current) use of aspirin: Secondary | ICD-10-CM | POA: Diagnosis not present

## 2017-04-04 DIAGNOSIS — E785 Hyperlipidemia, unspecified: Secondary | ICD-10-CM | POA: Insufficient documentation

## 2017-04-04 LAB — CBC WITH DIFFERENTIAL/PLATELET
BASOS PCT: 0 %
Basophils Absolute: 0 10*3/uL (ref 0.0–0.1)
EOS ABS: 0.2 10*3/uL (ref 0.0–0.7)
Eosinophils Relative: 2 %
HCT: 40.8 % (ref 39.0–52.0)
Hemoglobin: 14.2 g/dL (ref 13.0–17.0)
Lymphocytes Relative: 18 %
Lymphs Abs: 2 10*3/uL (ref 0.7–4.0)
MCH: 33.4 pg (ref 26.0–34.0)
MCHC: 34.8 g/dL (ref 30.0–36.0)
MCV: 96 fL (ref 78.0–100.0)
MONO ABS: 1.1 10*3/uL — AB (ref 0.1–1.0)
MONOS PCT: 10 %
Neutro Abs: 7.8 10*3/uL — ABNORMAL HIGH (ref 1.7–7.7)
Neutrophils Relative %: 70 %
Platelets: 243 10*3/uL (ref 150–400)
RBC: 4.25 MIL/uL (ref 4.22–5.81)
RDW: 13 % (ref 11.5–15.5)
WBC: 11.1 10*3/uL — ABNORMAL HIGH (ref 4.0–10.5)

## 2017-04-04 LAB — COMPREHENSIVE METABOLIC PANEL
ALBUMIN: 4.1 g/dL (ref 3.5–5.0)
ALT: 26 U/L (ref 17–63)
ANION GAP: 11 (ref 5–15)
AST: 36 U/L (ref 15–41)
Alkaline Phosphatase: 45 U/L (ref 38–126)
BILIRUBIN TOTAL: 0.9 mg/dL (ref 0.3–1.2)
BUN: 24 mg/dL — ABNORMAL HIGH (ref 6–20)
CO2: 24 mmol/L (ref 22–32)
Calcium: 9.8 mg/dL (ref 8.9–10.3)
Chloride: 103 mmol/L (ref 101–111)
Creatinine, Ser: 1.64 mg/dL — ABNORMAL HIGH (ref 0.61–1.24)
GFR calc non Af Amer: 43 mL/min — ABNORMAL LOW (ref >60)
GFR, EST AFRICAN AMERICAN: 50 mL/min — AB (ref >60)
GLUCOSE: 96 mg/dL (ref 65–99)
POTASSIUM: 4.1 mmol/L (ref 3.5–5.1)
SODIUM: 138 mmol/L (ref 135–145)
TOTAL PROTEIN: 7.2 g/dL (ref 6.5–8.1)

## 2017-04-04 LAB — SURGICAL PCR SCREEN
MRSA, PCR: NEGATIVE
STAPHYLOCOCCUS AUREUS: NEGATIVE

## 2017-04-04 LAB — PROTIME-INR
INR: 1.01
Prothrombin Time: 13.2 seconds (ref 11.4–15.2)

## 2017-04-04 LAB — APTT: aPTT: 27 seconds (ref 24–36)

## 2017-04-04 NOTE — Progress Notes (Addendum)
PCP is Dr. Unk Pinto Cardiologist is Dr. Gwenlyn Found Stress test 08-30-17 Echo denies  Card cath 01-03-2011 A1c 5.1 done 03-21-17 Denies chest pain, cough or fever.  States he coded while having surgery with Dr Noemi Chapel in 2012 and that is when he had the stent placed. He reports doing cardiac rehab since 2013 - he goes 2-3 times per week  Reports he was told to continue taking aspirin, but to stop plavix 04-12-17, but states he will call the office to make sure that is right. Encouraged to call the office to make sure.

## 2017-04-05 ENCOUNTER — Encounter (HOSPITAL_COMMUNITY)
Admission: RE | Admit: 2017-04-05 | Discharge: 2017-04-05 | Disposition: A | Discharge status: Home / Self Care | Payer: Self-pay | Source: Ambulatory Visit | Attending: Cardiovascular Disease | Admitting: Cardiovascular Disease

## 2017-04-05 LAB — URINE CULTURE

## 2017-04-05 NOTE — Progress Notes (Signed)
Anesthesia Chart Review:  Pt is a 62 year old male scheduled for    - PCP is Unk Pinto, MD - Cardiologist is Quay Burow, MD who cleared pt for surgery. Last office visit 09/21/16  PMH includes:  CAD (DES to LAD 2012), atrial flutter, NSVT, HTN, hyperlipidemia, CKD (stage III), GERD. Never smoker. BMI 30.5. S/p L knee arthroscopy 01/02/11. S/p R knee arthroscopy 04/21/12  -  In November 2012, he was undergoing a right knee arthroscopy at the [facility] and during the procedure was noted to have a 30 second run of VT.  He was transferred to [facility] where he underwent a cardiac cath and ultimately a LAD DES.   Medications include: ASA 81 mg, atenolol, Lipitor, Plavix, diltiazem, doxazosin, fenofibrate, Lasix, olmesartan, Protonix, Zantac. Pt to continue ASA perioperatively, and stop plavix 04/12/17.   BP 117/72   Pulse 66   Temp 36.8 C   Resp 18   Ht 5\' 8"  (1.727 m)   Wt 200 lb 6.4 oz (90.9 kg)   SpO2 97%   BMI 30.47 kg/m   Preoperative labs reviewed.   - Cr 1.64, BUN 24. Cr ranged 1.49-1.56 over past year; known CKD stage 3  EKG 03/21/17: NSR. Nonspecific T abnormality  Cardiac cath 01/03/11:  1. LM: normal 2. LAD: 70% stenosis in midportion of takeoff of small diagonal branch (physiologically significant by FFR). S/p DES to LAD 3. CX: Free of significant disease 4. RCA: Codominant with anterior takeoff and free of significant disease  If no changes, I anticipate pt can proceed with surgery as scheduled.   Willeen Cass, FNP-BC [facility] Short Stay Surgical Center/Anesthesiology Phone: [phone] 04/05/2017 2:48 PM

## 2017-04-06 ENCOUNTER — Encounter: Payer: Self-pay | Admitting: Internal Medicine

## 2017-04-08 ENCOUNTER — Encounter (HOSPITAL_COMMUNITY): Payer: Self-pay

## 2017-04-10 ENCOUNTER — Encounter (HOSPITAL_COMMUNITY)
Admission: RE | Admit: 2017-04-10 | Discharge: 2017-04-10 | Disposition: A | Discharge status: Home / Self Care | Payer: Self-pay | Source: Ambulatory Visit | Attending: Cardiovascular Disease | Admitting: Cardiovascular Disease

## 2017-04-12 ENCOUNTER — Encounter (HOSPITAL_COMMUNITY)
Admission: RE | Admit: 2017-04-12 | Discharge: 2017-04-12 | Disposition: A | Discharge status: Home / Self Care | Payer: Self-pay | Source: Ambulatory Visit | Attending: Cardiovascular Disease | Admitting: Cardiovascular Disease

## 2017-04-12 MED ORDER — BUPIVACAINE LIPOSOME 1.3 % IJ SUSP
20.0000 mL | INTRAMUSCULAR | Status: AC
Start: 2017-04-15 — End: 2017-04-15
  Administered 2017-04-15: 20 mL
  Filled 2017-04-12: qty 20

## 2017-04-12 MED ORDER — TRANEXAMIC ACID 1000 MG/10ML IV SOLN
1000.0000 mg | INTRAVENOUS | Status: DC
  Filled 2017-04-12: qty 10

## 2017-04-15 ENCOUNTER — Encounter (HOSPITAL_COMMUNITY): Payer: Self-pay

## 2017-04-15 ENCOUNTER — Encounter (HOSPITAL_COMMUNITY): Payer: Self-pay | Admitting: *Deleted

## 2017-04-15 ENCOUNTER — Inpatient Hospital Stay (HOSPITAL_COMMUNITY): Payer: 59 | Admitting: Certified Registered"

## 2017-04-15 ENCOUNTER — Inpatient Hospital Stay (HOSPITAL_COMMUNITY): Payer: 59 | Admitting: Emergency Medicine

## 2017-04-15 ENCOUNTER — Other Ambulatory Visit: Payer: Self-pay

## 2017-04-15 ENCOUNTER — Observation Stay (HOSPITAL_COMMUNITY)
Admission: RE | Admit: 2017-04-15 | Discharge: 2017-04-16 | Disposition: A | Discharge status: Home / Self Care | Payer: 59 | Source: Ambulatory Visit | Attending: Orthopedic Surgery | Admitting: Orthopedic Surgery

## 2017-04-15 ENCOUNTER — Encounter (HOSPITAL_COMMUNITY)
Admission: RE | Disposition: A | Discharge status: Home / Self Care | Payer: Self-pay | Source: Ambulatory Visit | Attending: Orthopedic Surgery

## 2017-04-15 DIAGNOSIS — I10 Essential (primary) hypertension: Secondary | ICD-10-CM | POA: Diagnosis present

## 2017-04-15 DIAGNOSIS — M1711 Unilateral primary osteoarthritis, right knee: Secondary | ICD-10-CM | POA: Diagnosis not present

## 2017-04-15 DIAGNOSIS — Z955 Presence of coronary angioplasty implant and graft: Secondary | ICD-10-CM | POA: Diagnosis not present

## 2017-04-15 DIAGNOSIS — E782 Mixed hyperlipidemia: Secondary | ICD-10-CM | POA: Diagnosis not present

## 2017-04-15 DIAGNOSIS — K219 Gastro-esophageal reflux disease without esophagitis: Secondary | ICD-10-CM | POA: Diagnosis not present

## 2017-04-15 DIAGNOSIS — Z79899 Other long term (current) drug therapy: Secondary | ICD-10-CM | POA: Insufficient documentation

## 2017-04-15 DIAGNOSIS — F419 Anxiety disorder, unspecified: Secondary | ICD-10-CM | POA: Diagnosis not present

## 2017-04-15 DIAGNOSIS — I472 Ventricular tachycardia: Secondary | ICD-10-CM

## 2017-04-15 DIAGNOSIS — I2510 Atherosclerotic heart disease of native coronary artery without angina pectoris: Secondary | ICD-10-CM | POA: Diagnosis not present

## 2017-04-15 DIAGNOSIS — Z7982 Long term (current) use of aspirin: Secondary | ICD-10-CM | POA: Insufficient documentation

## 2017-04-15 DIAGNOSIS — Z888 Allergy status to other drugs, medicaments and biological substances status: Secondary | ICD-10-CM | POA: Insufficient documentation

## 2017-04-15 DIAGNOSIS — I4892 Unspecified atrial flutter: Secondary | ICD-10-CM | POA: Diagnosis not present

## 2017-04-15 DIAGNOSIS — G8918 Other acute postprocedural pain: Secondary | ICD-10-CM | POA: Diagnosis not present

## 2017-04-15 DIAGNOSIS — R2689 Other abnormalities of gait and mobility: Secondary | ICD-10-CM | POA: Insufficient documentation

## 2017-04-15 DIAGNOSIS — I4729 Other ventricular tachycardia: Secondary | ICD-10-CM

## 2017-04-15 HISTORY — DX: Unilateral primary osteoarthritis, right knee: M17.11

## 2017-04-15 HISTORY — PX: TOTAL KNEE ARTHROPLASTY: SHX125

## 2017-04-15 SURGERY — ARTHROPLASTY, KNEE, TOTAL
Anesthesia: Spinal | Site: Knee | Laterality: Right

## 2017-04-15 MED ORDER — DIPHENHYDRAMINE HCL 12.5 MG/5ML PO ELIX: 12.5000 mg | ORAL_SOLUTION | ORAL | Status: DC | PRN

## 2017-04-15 MED ORDER — FAMOTIDINE 10 MG PO TABS
10.0000 mg | ORAL_TABLET | Freq: Two times a day (BID) | ORAL | Status: DC
  Filled 2017-04-15 (×3): qty 1

## 2017-04-15 MED ORDER — EPHEDRINE SULFATE 50 MG/ML IJ SOLN
INTRAMUSCULAR | Status: DC | PRN
  Administered 2017-04-15: 10 mg via INTRAVENOUS
  Administered 2017-04-15: 5 mg via INTRAVENOUS
  Administered 2017-04-15: 10 mg via INTRAVENOUS

## 2017-04-15 MED ORDER — CEFAZOLIN SODIUM-DEXTROSE 2-4 GM/100ML-% IV SOLN
2.0000 g | INTRAVENOUS | Status: AC
  Administered 2017-04-15: 2 g via INTRAVENOUS

## 2017-04-15 MED ORDER — METOCLOPRAMIDE HCL 5 MG/ML IJ SOLN: 5.0000 mg | Freq: Three times a day (TID) | INTRAMUSCULAR | Status: DC | PRN

## 2017-04-15 MED ORDER — LACTATED RINGERS IV SOLN
INTRAVENOUS | Status: DC | PRN
  Administered 2017-04-15 (×2): via INTRAVENOUS

## 2017-04-15 MED ORDER — BUPROPION HCL ER (XL) 300 MG PO TB24
300.0000 mg | ORAL_TABLET | Freq: Every day | ORAL | Status: DC
  Administered 2017-04-16: 300 mg via ORAL
  Filled 2017-04-15: qty 1

## 2017-04-15 MED ORDER — BUPIVACAINE-EPINEPHRINE (PF) 0.5% -1:200000 IJ SOLN
INTRAMUSCULAR | Status: AC
  Filled 2017-04-15: qty 60

## 2017-04-15 MED ORDER — ACETAMINOPHEN 325 MG PO TABS
650.0000 mg | ORAL_TABLET | ORAL | Status: DC | PRN
  Administered 2017-04-16: 650 mg via ORAL
  Filled 2017-04-15: qty 2

## 2017-04-15 MED ORDER — BUPIVACAINE HCL (PF) 0.25 % IJ SOLN
INTRAMUSCULAR | Status: AC
  Filled 2017-04-15: qty 30

## 2017-04-15 MED ORDER — ALPRAZOLAM 1 MG PO TABS: 1.0000 mg | ORAL_TABLET | Freq: Three times a day (TID) | ORAL | Status: DC | PRN

## 2017-04-15 MED ORDER — PROPOFOL 10 MG/ML IV BOLUS
INTRAVENOUS | Status: AC
  Filled 2017-04-15: qty 20

## 2017-04-15 MED ORDER — SODIUM CHLORIDE 0.9 % IJ SOLN
INTRAMUSCULAR | Status: DC | PRN
  Administered 2017-04-15: 50 mL

## 2017-04-15 MED ORDER — CEFUROXIME SODIUM 1.5 G IV SOLR
INTRAVENOUS | Status: AC
  Filled 2017-04-15: qty 1.5

## 2017-04-15 MED ORDER — OXYCODONE HCL 5 MG PO TABS: 5.0000 mg | ORAL_TABLET | Freq: Once | ORAL | Status: DC | PRN

## 2017-04-15 MED ORDER — FUROSEMIDE 80 MG PO TABS: 80.0000 mg | ORAL_TABLET | Freq: Every day | ORAL | Status: DC

## 2017-04-15 MED ORDER — POLYETHYLENE GLYCOL 3350 17 G PO PACK
17.0000 g | PACK | Freq: Two times a day (BID) | ORAL | Status: DC
  Administered 2017-04-15 – 2017-04-16 (×2): 17 g via ORAL
  Filled 2017-04-15 (×3): qty 1

## 2017-04-15 MED ORDER — PANTOPRAZOLE SODIUM 40 MG PO TBEC
40.0000 mg | DELAYED_RELEASE_TABLET | Freq: Every day | ORAL | Status: DC
  Administered 2017-04-15: 40 mg via ORAL
  Filled 2017-04-15 (×2): qty 1

## 2017-04-15 MED ORDER — POVIDONE-IODINE 7.5 % EX SOLN
Freq: Once | CUTANEOUS | Status: DC
  Filled 2017-04-15: qty 118

## 2017-04-15 MED ORDER — DOXAZOSIN MESYLATE 4 MG PO TABS
4.0000 mg | ORAL_TABLET | Freq: Every evening | ORAL | Status: DC
  Administered 2017-04-15: 4 mg via ORAL
  Filled 2017-04-15 (×2): qty 1

## 2017-04-15 MED ORDER — ACETAMINOPHEN 500 MG PO TABS
1000.0000 mg | ORAL_TABLET | Freq: Four times a day (QID) | ORAL | Status: AC
  Administered 2017-04-15 – 2017-04-16 (×4): 1000 mg via ORAL
  Filled 2017-04-15 (×4): qty 2

## 2017-04-15 MED ORDER — ACETAMINOPHEN 650 MG RE SUPP: 650.0000 mg | RECTAL | Status: DC | PRN

## 2017-04-15 MED ORDER — DILTIAZEM HCL ER COATED BEADS 240 MG PO CP24
240.0000 mg | ORAL_CAPSULE | Freq: Every day | ORAL | Status: DC
  Administered 2017-04-16: 240 mg via ORAL
  Filled 2017-04-15: qty 1

## 2017-04-15 MED ORDER — METOCLOPRAMIDE HCL 5 MG PO TABS: 5.0000 mg | ORAL_TABLET | Freq: Three times a day (TID) | ORAL | Status: DC | PRN

## 2017-04-15 MED ORDER — ASPIRIN EC 325 MG PO TBEC
325.0000 mg | DELAYED_RELEASE_TABLET | Freq: Every day | ORAL | Status: DC
  Administered 2017-04-16: 325 mg via ORAL
  Filled 2017-04-15: qty 1

## 2017-04-15 MED ORDER — ONDANSETRON HCL 4 MG/2ML IJ SOLN: 4.0000 mg | Freq: Four times a day (QID) | INTRAMUSCULAR | Status: DC | PRN

## 2017-04-15 MED ORDER — OXYCODONE HCL 5 MG PO TABS: 5.0000 mg | ORAL_TABLET | ORAL | Status: DC | PRN

## 2017-04-15 MED ORDER — DOCUSATE SODIUM 100 MG PO CAPS
100.0000 mg | ORAL_CAPSULE | Freq: Two times a day (BID) | ORAL | Status: DC
  Administered 2017-04-15 – 2017-04-16 (×3): 100 mg via ORAL
  Filled 2017-04-15 (×3): qty 1

## 2017-04-15 MED ORDER — PHENOL 1.4 % MT LIQD
1.0000 | OROMUCOSAL | Status: DC | PRN
Start: 2017-04-15 — End: 2017-04-16

## 2017-04-15 MED ORDER — FENTANYL CITRATE (PF) 250 MCG/5ML IJ SOLN
INTRAMUSCULAR | Status: AC
  Filled 2017-04-15: qty 5

## 2017-04-15 MED ORDER — CEFAZOLIN SODIUM-DEXTROSE 2-4 GM/100ML-% IV SOLN
2.0000 g | Freq: Four times a day (QID) | INTRAVENOUS | Status: AC
  Administered 2017-04-15 (×2): 2 g via INTRAVENOUS
  Filled 2017-04-15 (×2): qty 100

## 2017-04-15 MED ORDER — FENTANYL CITRATE (PF) 100 MCG/2ML IJ SOLN
INTRAMUSCULAR | Status: DC | PRN
  Administered 2017-04-15 (×2): 50 ug via INTRAVENOUS

## 2017-04-15 MED ORDER — NITROGLYCERIN 0.3 MG SL SUBL
0.3000 mg | SUBLINGUAL_TABLET | SUBLINGUAL | Status: DC | PRN
  Filled 2017-04-15: qty 100

## 2017-04-15 MED ORDER — EPINEPHRINE PF 1 MG/ML IJ SOLN
INTRAMUSCULAR | Status: DC | PRN
  Administered 2017-04-15: 3 mg

## 2017-04-15 MED ORDER — CLOPIDOGREL BISULFATE 75 MG PO TABS
75.0000 mg | ORAL_TABLET | Freq: Every day | ORAL | Status: DC
  Administered 2017-04-16: 75 mg via ORAL
  Filled 2017-04-15: qty 1

## 2017-04-15 MED ORDER — ONDANSETRON HCL 4 MG/2ML IJ SOLN
INTRAMUSCULAR | Status: DC | PRN
  Administered 2017-04-15: 4 mg via INTRAVENOUS

## 2017-04-15 MED ORDER — ALUM & MAG HYDROXIDE-SIMETH 200-200-20 MG/5ML PO SUSP: 30.0000 mL | ORAL | Status: DC | PRN

## 2017-04-15 MED ORDER — FENTANYL CITRATE (PF) 100 MCG/2ML IJ SOLN: 25.0000 ug | INTRAMUSCULAR | Status: DC | PRN

## 2017-04-15 MED ORDER — MIDAZOLAM HCL 5 MG/5ML IJ SOLN
INTRAMUSCULAR | Status: DC | PRN
  Administered 2017-04-15: 2 mg via INTRAVENOUS

## 2017-04-15 MED ORDER — EPINEPHRINE PF 1 MG/ML IJ SOLN
INTRAMUSCULAR | Status: AC
  Filled 2017-04-15: qty 2

## 2017-04-15 MED ORDER — ATORVASTATIN CALCIUM 40 MG PO TABS
40.0000 mg | ORAL_TABLET | Freq: Every evening | ORAL | Status: DC
  Administered 2017-04-15: 40 mg via ORAL
  Filled 2017-04-15: qty 1

## 2017-04-15 MED ORDER — DEXAMETHASONE SODIUM PHOSPHATE 10 MG/ML IJ SOLN
INTRAMUSCULAR | Status: DC | PRN
  Administered 2017-04-15: 10 mg via INTRAVENOUS

## 2017-04-15 MED ORDER — CHLORHEXIDINE GLUCONATE 4 % EX LIQD: 60.0000 mL | Freq: Once | CUTANEOUS | Status: DC

## 2017-04-15 MED ORDER — POTASSIUM CHLORIDE IN NACL 20-0.9 MEQ/L-% IV SOLN
INTRAVENOUS | Status: DC
  Filled 2017-04-15: qty 1000

## 2017-04-15 MED ORDER — OXYCODONE HCL 5 MG/5ML PO SOLN: 5.0000 mg | Freq: Once | ORAL | Status: DC | PRN

## 2017-04-15 MED ORDER — MIDAZOLAM HCL 2 MG/2ML IJ SOLN
INTRAMUSCULAR | Status: AC
  Filled 2017-04-15: qty 2

## 2017-04-15 MED ORDER — DEXAMETHASONE SODIUM PHOSPHATE 10 MG/ML IJ SOLN
10.0000 mg | Freq: Three times a day (TID) | INTRAMUSCULAR | Status: DC
  Administered 2017-04-15 – 2017-04-16 (×3): 10 mg via INTRAVENOUS
  Filled 2017-04-15 (×3): qty 1

## 2017-04-15 MED ORDER — BUPIVACAINE HCL (PF) 0.25 % IJ SOLN
INTRAMUSCULAR | Status: AC
  Filled 2017-04-15: qty 20

## 2017-04-15 MED ORDER — ROPIVACAINE HCL 5 MG/ML IJ SOLN
INTRAMUSCULAR | Status: DC | PRN
  Administered 2017-04-15: 20 mL via PERINEURAL

## 2017-04-15 MED ORDER — LACTATED RINGERS IV SOLN: INTRAVENOUS | Status: DC

## 2017-04-15 MED ORDER — ATENOLOL 100 MG PO TABS
100.0000 mg | ORAL_TABLET | Freq: Every day | ORAL | Status: DC
  Administered 2017-04-16: 100 mg via ORAL
  Filled 2017-04-15: qty 1
  Filled 2017-04-15: qty 2

## 2017-04-15 MED ORDER — SODIUM CHLORIDE 0.9 % IR SOLN
Status: DC | PRN
  Administered 2017-04-15: 3000 mL

## 2017-04-15 MED ORDER — CEFAZOLIN SODIUM-DEXTROSE 2-4 GM/100ML-% IV SOLN
INTRAVENOUS | Status: AC
  Filled 2017-04-15: qty 100

## 2017-04-15 MED ORDER — PROPOFOL 500 MG/50ML IV EMUL
INTRAVENOUS | Status: DC | PRN
  Administered 2017-04-15: 75 ug/kg/min via INTRAVENOUS

## 2017-04-15 MED ORDER — BUPIVACAINE IN DEXTROSE 0.75-8.25 % IT SOLN
INTRATHECAL | Status: DC | PRN
  Administered 2017-04-15: 2 mL via INTRATHECAL

## 2017-04-15 MED ORDER — GLYCOPYRROLATE 0.2 MG/ML IJ SOLN
INTRAMUSCULAR | Status: DC | PRN
  Administered 2017-04-15: 0.2 mg via INTRAVENOUS

## 2017-04-15 MED ORDER — HYDROMORPHONE HCL 1 MG/ML IJ SOLN
1.0000 mg | INTRAMUSCULAR | Status: DC | PRN
  Administered 2017-04-16: 1 mg via INTRAVENOUS
  Filled 2017-04-15: qty 1

## 2017-04-15 MED ORDER — ALPRAZOLAM 0.5 MG PO TABS
1.0000 mg | ORAL_TABLET | Freq: Three times a day (TID) | ORAL | Status: DC | PRN
  Administered 2017-04-15 – 2017-04-16 (×2): 1 mg via ORAL
  Filled 2017-04-15 (×2): qty 2

## 2017-04-15 MED ORDER — BUPIVACAINE HCL (PF) 0.25 % IJ SOLN
INTRAMUSCULAR | Status: DC | PRN
  Administered 2017-04-15: 50 mL

## 2017-04-15 MED ORDER — MENTHOL 3 MG MT LOZG: 1.0000 | LOZENGE | OROMUCOSAL | Status: DC | PRN

## 2017-04-15 MED ORDER — OXYCODONE HCL 5 MG PO TABS
10.0000 mg | ORAL_TABLET | ORAL | Status: DC | PRN
  Administered 2017-04-15 – 2017-04-16 (×5): 10 mg via ORAL
  Filled 2017-04-15 (×5): qty 2

## 2017-04-15 MED ORDER — 0.9 % SODIUM CHLORIDE (POUR BTL) OPTIME
TOPICAL | Status: DC | PRN
  Administered 2017-04-15: 1000 mL

## 2017-04-15 MED ORDER — ONDANSETRON HCL 4 MG PO TABS: 4.0000 mg | ORAL_TABLET | Freq: Four times a day (QID) | ORAL | Status: DC | PRN

## 2017-04-15 SURGICAL SUPPLY — 78 items
APL SKNCLS STERI-STRIP NONHPOA (GAUZE/BANDAGES/DRESSINGS) ×1
BANDAGE ESMARK 6X9 LF (GAUZE/BANDAGES/DRESSINGS) ×1 IMPLANT
BENZOIN TINCTURE PRP APPL 2/3 (GAUZE/BANDAGES/DRESSINGS) ×3 IMPLANT
BLADE SAGITTAL 25.0X1.19X90 (BLADE) ×2 IMPLANT
BLADE SAGITTAL 25.0X1.19X90MM (BLADE) ×1
BLADE SAW SGTL 13X75X1.27 (BLADE) ×3 IMPLANT
BLADE SURG 10 STRL SS (BLADE) ×6 IMPLANT
BNDG CMPR 9X6 STRL LF SNTH (GAUZE/BANDAGES/DRESSINGS) ×1
BNDG ELASTIC 6X15 VLCR STRL LF (GAUZE/BANDAGES/DRESSINGS) ×3 IMPLANT
BNDG ESMARK 6X9 LF (GAUZE/BANDAGES/DRESSINGS) ×3
BOWL SMART MIX CTS (DISPOSABLE) ×3 IMPLANT
CAPT KNEE TOTAL 3 ATTUNE ×3 IMPLANT
CEMENT HV SMART SET (Cement) ×6 IMPLANT
CLOSURE WOUND 1/2 X4 (GAUZE/BANDAGES/DRESSINGS) ×1
COVER SURGICAL LIGHT HANDLE (MISCELLANEOUS) ×3 IMPLANT
CUFF TOURNIQUET SINGLE 34IN LL (TOURNIQUET CUFF) ×3 IMPLANT
CUFF TOURNIQUET SINGLE 44IN (TOURNIQUET CUFF) IMPLANT
DECANTER SPIKE VIAL GLASS SM (MISCELLANEOUS) ×6 IMPLANT
DRAPE EXTREMITY T 121X128X90 (DRAPE) ×3 IMPLANT
DRAPE HALF SHEET 40X57 (DRAPES) ×6 IMPLANT
DRAPE INCISE IOBAN 66X45 STRL (DRAPES) IMPLANT
DRAPE ORTHO SPLIT 77X108 STRL (DRAPES) ×3
DRAPE SURG ORHT 6 SPLT 77X108 (DRAPES) ×1 IMPLANT
DRAPE U-SHAPE 47X51 STRL (DRAPES) ×3 IMPLANT
DRSG AQUACEL AG ADV 3.5X10 (GAUZE/BANDAGES/DRESSINGS) ×3 IMPLANT
DURAPREP 26ML APPLICATOR (WOUND CARE) ×6 IMPLANT
ELECT CAUTERY BLADE 6.4 (BLADE) ×3 IMPLANT
ELECT REM PT RETURN 9FT ADLT (ELECTROSURGICAL) ×3
ELECTRODE REM PT RTRN 9FT ADLT (ELECTROSURGICAL) ×1 IMPLANT
FACESHIELD WRAPAROUND (MASK) ×3 IMPLANT
GLOVE BIO SURGEON STRL SZ7 (GLOVE) ×3 IMPLANT
GLOVE BIOGEL PI IND STRL 7.0 (GLOVE) ×1 IMPLANT
GLOVE BIOGEL PI IND STRL 7.5 (GLOVE) ×1 IMPLANT
GLOVE BIOGEL PI INDICATOR 7.0 (GLOVE) ×2
GLOVE BIOGEL PI INDICATOR 7.5 (GLOVE) ×2
GLOVE SS BIOGEL STRL SZ 7.5 (GLOVE) ×1 IMPLANT
GLOVE SUPERSENSE BIOGEL SZ 7.5 (GLOVE) ×2
GLOVE SURG SS PI 7.0 STRL IVOR (GLOVE) ×6 IMPLANT
GOWN STRL REUS W/ TWL LRG LVL3 (GOWN DISPOSABLE) ×1 IMPLANT
GOWN STRL REUS W/ TWL XL LVL3 (GOWN DISPOSABLE) ×2 IMPLANT
GOWN STRL REUS W/TWL LRG LVL3 (GOWN DISPOSABLE) ×3
GOWN STRL REUS W/TWL XL LVL3 (GOWN DISPOSABLE) ×4
HANDPIECE INTERPULSE COAX TIP (DISPOSABLE) ×3
HOOD PEEL AWAY FACE SHEILD DIS (HOOD) ×6 IMPLANT
HOOD PEEL AWAY FLYTE STAYCOOL (MISCELLANEOUS) ×3 IMPLANT
IMMOBILIZER KNEE 22 UNIV (SOFTGOODS) ×3 IMPLANT
KIT BASIN OR (CUSTOM PROCEDURE TRAY) ×3 IMPLANT
KIT ROOM TURNOVER OR (KITS) ×3 IMPLANT
MANIFOLD NEPTUNE II (INSTRUMENTS) ×3 IMPLANT
MARKER SKIN DUAL TIP RULER LAB (MISCELLANEOUS) ×3 IMPLANT
NEEDLE FILTER BLUNT 18X 1/2SAF (NEEDLE) ×2
NEEDLE FILTER BLUNT 18X1 1/2 (NEEDLE) ×1 IMPLANT
NEEDLE HYPO 22GX1.5 SAFETY (NEEDLE) ×6 IMPLANT
NS IRRIG 1000ML POUR BTL (IV SOLUTION) ×3 IMPLANT
PACK TOTAL JOINT (CUSTOM PROCEDURE TRAY) ×3 IMPLANT
PAD ARMBOARD 7.5X6 YLW CONV (MISCELLANEOUS) ×6 IMPLANT
PIN STEINMAN FIXATION KNEE (PIN) IMPLANT
SET HNDPC FAN SPRY TIP SCT (DISPOSABLE) ×1 IMPLANT
STRIP CLOSURE SKIN 1/2X4 (GAUZE/BANDAGES/DRESSINGS) ×2 IMPLANT
SUCTION FRAZIER HANDLE 10FR (MISCELLANEOUS) ×2
SUCTION TUBE FRAZIER 10FR DISP (MISCELLANEOUS) ×1 IMPLANT
SUT MNCRL AB 3-0 PS2 18 (SUTURE) ×3 IMPLANT
SUT VIC AB 0 CT1 27 (SUTURE) ×6
SUT VIC AB 0 CT1 27XBRD ANBCTR (SUTURE) ×2 IMPLANT
SUT VIC AB 1 CT1 27 (SUTURE) ×6
SUT VIC AB 1 CT1 27XBRD ANBCTR (SUTURE) ×2 IMPLANT
SUT VIC AB 2-0 CT1 27 (SUTURE) ×6
SUT VIC AB 2-0 CT1 TAPERPNT 27 (SUTURE) ×2 IMPLANT
SYR 3ML LL SCALE MARK (SYRINGE) ×3 IMPLANT
SYR CONTROL 10ML LL (SYRINGE) ×6 IMPLANT
TOWEL OR 17X24 6PK STRL BLUE (TOWEL DISPOSABLE) ×3 IMPLANT
TOWEL OR 17X26 10 PK STRL BLUE (TOWEL DISPOSABLE) ×3 IMPLANT
TRAY CATH 16FR W/PLASTIC CATH (SET/KITS/TRAYS/PACK) IMPLANT
TRAY FOLEY CATH SILVER 16FR (SET/KITS/TRAYS/PACK) ×3 IMPLANT
TUBE CONNECTING 12'X1/4 (SUCTIONS) ×1
TUBE CONNECTING 12X1/4 (SUCTIONS) ×2 IMPLANT
WATER STERILE IRR 1000ML POUR (IV SOLUTION) ×3 IMPLANT
YANKAUER SUCT BULB TIP NO VENT (SUCTIONS) ×3 IMPLANT

## 2017-04-15 NOTE — Interval H&P Note (Signed)
History and Physical Interval Note:  04/15/2017 7:03 AM  [name]  has presented today for surgery, with the diagnosis of djd right knee  The various methods of treatment have been discussed with the patient and family. After consideration of risks, benefits and other options for treatment, the patient has consented to  Procedure(s): TOTAL KNEE ARTHROPLASTY (Right) as a surgical intervention .  The patient's history has been reviewed, patient examined, no change in status, stable for surgery.  I have reviewed the patient's chart and labs.  Questions were answered to the patient's satisfaction.     Marvin Carter

## 2017-04-15 NOTE — Op Note (Signed)
MRN:     [id number] DOB/AGE:    1956/01/26 / 62 y.o.       OPERATIVE REPORT    DATE OF PROCEDURE:  04/15/2017       PREOPERATIVE DIAGNOSIS:   Primary localized OA right knee      Estimated body mass index is 30.32 kg/m as calculated from the following:   Height as of this encounter: 5\' 8"  (1.727 m).   Weight as of this encounter: 90.4 kg (199 lb 6.4 oz).                                                        POSTOPERATIVE DIAGNOSIS:   same                                                                      PROCEDURE:  Procedure(s): TOTAL KNEE ARTHROPLASTY Using Depuy Attune RP implants #6 Femur, #8Tibia, 48mm  RP bearing, 35 Patella     SURGEON: Lorn Junes    ASSISTANT: Frederik Pear MD,  Kirstin Shepperson PA-C   (Present and scrubbed throughout the case, critical for assistance with exposure, retraction, instrumentation, and closure.)         ANESTHESIA: Spinal with Adductor Nerve Block     TOURNIQUET TIME: 16WVP   COMPLICATIONS:  None     SPECIMENS: None   INDICATIONS FOR PROCEDURE: The patient has  djd right knee, varus deformities, XR shows bone on bone arthritis. Patient has failed all conservative measures including anti-inflammatory medicines, narcotics, attempts at  exercise and weight loss, cortisone injections and viscosupplementation.  Risks and benefits of surgery have been discussed, questions answered.   DESCRIPTION OF PROCEDURE: The patient identified by armband, received  right femoral nerve block and IV antibiotics, in the holding area at [facility]. Patient taken to the operating room, appropriate anesthetic  monitors were attached Spinal anesthesia induced with  the patient in supine position, Foley catheter was inserted. Tourniquet  applied high to the operative thigh. Lateral post and foot positioner  applied to the table, the lower extremity was then prepped and draped  in usual sterile fashion from the ankle to the tourniquet. Time-out  procedure was performed. The limb was wrapped with an Esmarch bandage and the tourniquet inflated to 365 mmHg. We began the operation by making the anterior midline incision starting at handbreadth above the patella going over the patella 1 cm medial to and  4 cm distal to the tibial tubercle. Small bleeders in the skin and the  subcutaneous tissue identified and cauterized. Transverse retinaculum was incised and reflected medially and a medial parapatellar arthrotomy was accomplished. the patella was everted and theprepatellar fat pad resected. The superficial medial collateral  ligament was then elevated from anterior to posterior along the proximal  flare of the tibia and anterior half of the menisci resected. The knee was hyperflexed exposing bone on bone arthritis. Peripheral and notch osteophytes as well as the cruciate ligaments were then resected. We continued to  work our way around posteriorly along the proximal tibia, and externally  rotated the tibia subluxing it out from underneath the femur. A McHale  retractor was placed through the notch and a lateral Hohmann retractor  placed, and we then drilled through the proximal tibia in line with the  axis of the tibia followed by an intramedullary guide rod and 2-degree  posterior slope cutting guide. The tibial cutting guide was pinned into place  allowing resection of 3 mm of bone medially and about 8 mm of bone  laterally because of her varus deformity. Satisfied with the tibial resection, we then  entered the distal femur 2 mm anterior to the PCL origin with the  intramedullary guide rod and applied the distal femoral cutting guide  set at 81mm, with 5 degrees of valgus. This was pinned along the  epicondylar axis. At this point, the distal femoral cut was accomplished without difficulty. We then sized for a #6 femoral component and pinned the guide in 3 degrees of external rotation.The chamfer cutting guide was pinned into place. The  anterior, posterior, and chamfer cuts were accomplished without difficulty followed by  the  RP box cutting guide and the box cut. We also removed posterior osteophytes from the posterior femoral condyles. At this  time, the knee was brought into full extension. We checked our  extension and flexion gaps and found them symmetric at 29mm.  The patella thickness measured at 25 mm. We set the cutting guide at 15 and removed the posterior 9.5-10 mm  of the patella sized for 35 button and drilled the lollipop. The knee  was then once again hyperflexed exposing the proximal tibia. We sized for a #8 tibial base plate, applied the smokestack and the conical reamer followed by the the Delta fin keel punch. We then hammered into place the  RP trial femoral component, inserted a 1 trial bearing, trial patellar button, and took the knee through range of motion from 0-130 degrees. No thumb pressure was required for patellar  tracking. At this point, all trial components were removed, a double batch of DePuy HV cement  was mixed and applied to all bony metallic mating surfaces except for the posterior condyles of the femur itself. In order, we  hammered into place the tibial tray and removed excess cement, the femoral component and removed excess cement, a 10mm  RP bearing  was inserted, and the knee brought to full extension with compression.  The patellar button was clamped into place, and excess cement  removed. While the cement cured the wound was irrigated out with normal saline solution pulse lavage.. Ligament stability and patellar tracking were checked and found to be excellent.. The parapatellar arthrotomy was closed with  #1 Vicryl suture. The subcutaneous tissue with 0 and 2-0 undyed  Vicryl suture, and 4-0 Monocryl.. A dressing of Aquaseal,  4 x 4, dressing sponges, Webril, and Ace wrap applied. Needle and sponge count were correct times 2.The patient awakened, extubated, and taken to recovery room  without difficulty. Vascular status was normal, pulses 2+ and symmetric.   Lorn Junes 04/15/2017, 9:03 AM

## 2017-04-15 NOTE — Transfer of Care (Signed)
Immediate Anesthesia Transfer of Care Note  Patient: Marvin Carter  Procedure(s) Performed: TOTAL KNEE ARTHROPLASTY (Right Knee)  Patient Location: PACU  Anesthesia Type:Spinal  Level of Consciousness: awake, alert  and oriented  Airway & Oxygen Therapy: Patient Spontanous Breathing and Patient connected to nasal cannula oxygen  Post-op Assessment: Report given to RN, Post -op Vital signs reviewed and stable and Patient moving all extremities X 4  Post vital signs: Reviewed and stable  Last Vitals:  Vitals:   04/03/17 1400 04/15/17 0626  BP: 120/74 120/84  Pulse: (!) 58 (!) 48  Resp:  18  Temp: 36.6 C 36.6 C  SpO2: 97% 99%    Last Pain:  Vitals:   04/15/17 0626  TempSrc: Oral      Patients Stated Pain Goal: 2 (34/03/52 4818)  Complications: No apparent anesthesia complications

## 2017-04-15 NOTE — Progress Notes (Signed)
Orthopedic Tech Progress Note Patient Details:  Marvin Carter [birth year] [id number]  CPM Right Knee CPM Right Knee: On Right Knee Flexion (Degrees): 90 Right Knee Extension (Degrees): 0 Additional Comments: trapeze bar patient helper  Post Interventions Patient Tolerated: Well Instructions Provided: Care of device  [name] 04/15/2017, 10:22 AM Viewed order from doctor's order list

## 2017-04-15 NOTE — Evaluation (Addendum)
Physical Therapy Evaluation Patient Details Name: Marvin Carter MRN: [id number] DOB: [birth year] Today's Date: 04/15/2017   History of Present Illness  Pt is a 62 y/o male s/p elective R TKA. PMH includes a flutter, CAD s/p stent, HTN, and bilateral rotator cuff repair.   Clinical Impression  Pt is s/p surgery above with deficits below. Mobility limited to chair as pt with R knee buckling, even with use of R KI. Pt requiring min guard for mobility this session. HEP initiated and reviewed knee precautions. Will continue to follow acutely to maximize functional mobility independence and safety.     Follow Up Recommendations Follow surgeon's recommendation for DC plan and follow-up therapies;Supervision for mobility/OOB    Equipment Recommendations  None recommended by PT    Recommendations for Other Services       Precautions / Restrictions Precautions Precautions: Knee Precaution Booklet Issued: Yes (comment) Precaution Comments: Reviewed supine knee ther ex with pt.  Required Braces or Orthoses: Knee Immobilizer - Right Knee Immobilizer - Right: Other (comment)(until good quad control) Restrictions Weight Bearing Restrictions: Yes RLE Weight Bearing: Weight bearing as tolerated      Mobility  Bed Mobility Overal bed mobility: Needs Assistance Bed Mobility: Supine to Sit     Supine to sit: Supervision     General bed mobility comments: Supervision for safety.   Transfers Overall transfer level: Needs assistance Equipment used: Rolling walker (2 wheeled) Transfers: Sit to/from Stand Sit to Stand: Min guard         General transfer comment: Min guard for safety. Verbal cues for safe hand placement.   Ambulation/Gait Ambulation/Gait assistance: Min guard;Min assist Ambulation Distance (Feet): 5 Feet Assistive device: Rolling walker (2 wheeled) Gait Pattern/deviations: Step-to pattern;Decreased step length - right;Decreased step length - left;Decreased weight shift  to right;Antalgic Gait velocity: Decreased  Gait velocity interpretation: Below normal speed for age/gender General Gait Details: Slow, antalgic gait. R knee buckling noted, even with use of KI, therefore distance limited to chair. Verbal cues to press through UEs on RW during gait. Verbal cues for sequencing using RW.   Stairs            Wheelchair Mobility    Modified Rankin (Stroke Patients Only)       Balance Overall balance assessment: Needs assistance Sitting-balance support: No upper extremity supported;Feet supported Sitting balance-Leahy Scale: Good     Standing balance support: Bilateral upper extremity supported;During functional activity Standing balance-Leahy Scale: Poor Standing balance comment: Reliant on BUE support.                             Pertinent Vitals/Pain Pain Assessment: 0-10 Pain Score: 3  Pain Location: R knee  Pain Descriptors / Indicators: Aching;Operative site guarding Pain Intervention(s): Limited activity within patient's tolerance;Monitored during session;Repositioned    Home Living Family/patient expects to be discharged to:: Private residence Living Arrangements: Spouse/significant other Available Help at Discharge: Family;Available 24 hours/day Type of Home: House Home Access: Stairs to enter Entrance Stairs-Rails: None Entrance Stairs-Number of Steps: 3-4 Home Layout: Two level;Able to live on main level with bedroom/bathroom Home Equipment: Gilford Rile - 2 wheels;Bedside commode;Other (comment);Cane - single point(CPM )      Prior Function Level of Independence: Independent               Hand Dominance   Dominant Hand: Right    Extremity/Trunk Assessment   Upper Extremity Assessment Upper Extremity Assessment: Defer to OT evaluation  Lower Extremity Assessment Lower Extremity Assessment: RLE deficits/detail RLE Deficits / Details: Reports some decresaed sensations. Able to perform ther ex below.  Deficits consistent with post op pain and weakness.     Cervical / Trunk Assessment Cervical / Trunk Assessment: Normal  Communication   Communication: No difficulties  Cognition Arousal/Alertness: Awake/alert Behavior During Therapy: WFL for tasks assessed/performed Overall Cognitive Status: Within Functional Limits for tasks assessed                                        General Comments General comments (skin integrity, edema, etc.): Pt's wife present during session.     Exercises Total Joint Exercises Ankle Circles/Pumps: AROM;Both;20 reps;Supine Quad Sets: AROM;Right;10 reps;Supine Towel Squeeze: AROM;Both;10 reps Heel Slides: AROM;Right;10 reps   Assessment/Plan    PT Assessment Patient needs continued PT services  PT Problem List Impaired sensation;Pain;Decreased knowledge of precautions;Decreased knowledge of use of DME;Decreased balance;Decreased range of motion;Decreased strength;Decreased mobility       PT Treatment Interventions DME instruction;Gait training;Functional mobility training;Therapeutic activities;Therapeutic exercise;Stair training;Balance training;Neuromuscular re-education;Patient/family education    PT Goals (Current goals can be found in the Care Plan section)  Acute Rehab PT Goals Patient Stated Goal: to go home  PT Goal Formulation: With patient Time For Goal Achievement: 04/29/17 Potential to Achieve Goals: Good    Frequency 7X/week   Barriers to discharge        Co-evaluation               AM-PAC PT "6 Clicks" Daily Activity  Outcome Measure Difficulty turning over in bed (including adjusting bedclothes, sheets and blankets)?: A Little Difficulty moving from lying on back to sitting on the side of the bed? : A Little Difficulty sitting down on and standing up from a chair with arms (e.g., wheelchair, bedside commode, etc,.)?: Unable Help needed moving to and from a bed to chair (including a wheelchair)?: A  Little Help needed walking in hospital room?: A Little Help needed climbing 3-5 steps with a railing? : A Lot 6 Click Score: 15    End of Session Equipment Utilized During Treatment: Gait belt;Right knee immobilizer Activity Tolerance: Patient tolerated treatment well Patient left: in chair;with call bell/phone within reach;with family/visitor present Nurse Communication: Mobility status PT Visit Diagnosis: Other abnormalities of gait and mobility (R26.89);Pain Pain - Right/Left: Right Pain - part of body: Knee    Time: 1351-1420 PT Time Calculation (min) (ACUTE ONLY): 29 min   Charges:   PT Evaluation $PT Eval Low Complexity: 1 Low PT Treatments $Therapeutic Activity: 8-22 mins   PT G Codes:        Leighton Ruff, PT, DPT  Acute Rehabilitation Services  Pager: [phone]   Rudean Hitt 04/15/2017, 2:30 PM

## 2017-04-15 NOTE — Anesthesia Preprocedure Evaluation (Signed)
Anesthesia Evaluation  Patient identified by MRN, date of birth, ID band Patient awake    Reviewed: Allergy & Precautions, H&P , NPO status , Patient's Chart, lab work & pertinent test results  Airway Mallampati: II   Neck ROM: full    Dental   Pulmonary neg pulmonary ROS,    breath sounds clear to auscultation       Cardiovascular hypertension, + CAD and + Cardiac Stents  + dysrhythmias  Rhythm:regular Rate:Normal     Neuro/Psych Anxiety    GI/Hepatic GERD  ,  Endo/Other    Renal/GU Renal InsufficiencyRenal disease     Musculoskeletal  (+) Arthritis ,   Abdominal   Peds  Hematology   Anesthesia Other Findings   Reproductive/Obstetrics                             Anesthesia Physical Anesthesia Plan  ASA: III  Anesthesia Plan: Spinal   Post-op Pain Management:  Regional for Post-op pain   Induction: Intravenous  PONV Risk Score and Plan: 1 and Ondansetron, Midazolam, Treatment may vary due to age or medical condition and Propofol infusion  Airway Management Planned:   Additional Equipment:   Intra-op Plan:   Post-operative Plan:   Informed Consent: I have reviewed the patients History and Physical, chart, labs and discussed the procedure including the risks, benefits and alternatives for the proposed anesthesia with the patient or authorized representative who has indicated his/her understanding and acceptance.     Plan Discussed with: CRNA, Anesthesiologist and Surgeon  Anesthesia Plan Comments:         Anesthesia Quick Evaluation

## 2017-04-15 NOTE — Anesthesia Procedure Notes (Signed)
Spinal  Patient location during procedure: OR Start time: 04/15/2017 7:15 AM End time: 04/15/2017 7:18 AM Staffing Anesthesiologist: Janeece Riggers, MD Performed: anesthesiologist  Preanesthetic Checklist Completed: patient identified, site marked, surgical consent, pre-op evaluation, timeout performed, IV checked, risks and benefits discussed and monitors and equipment checked Spinal Block Patient position: sitting Prep: DuraPrep Patient monitoring: cardiac monitor, continuous pulse ox and blood pressure Approach: midline Location: L3-4 Injection technique: single-shot Needle Needle type: Pencan  Needle gauge: 24 G Needle length: 9 cm Assessment Sensory level: T10 Additional Notes Functioning IV was confirmed and monitors were applied. Sterile prep and drape, including hand hygiene and sterile gloves were used. The patient was positioned and the spine was prepped. The skin was anesthetized with lidocaine.  Free flow of clear CSF was obtained prior to injecting local anesthetic into the CSF.  The spinal needle aspirated freely following injection.  The needle was carefully withdrawn.  The patient tolerated the procedure well.

## 2017-04-15 NOTE — Anesthesia Procedure Notes (Signed)
Anesthesia Regional Block: Adductor canal block   Pre-Anesthetic Checklist: ,, timeout performed, Correct Patient, Correct Site, Correct Laterality, Correct Procedure, Correct Position, site marked, Risks and benefits discussed,  Surgical consent,  Pre-op evaluation,  At surgeon's request and post-op pain management  Laterality: Right  Prep: chloraprep       Needles:  Injection technique: Single-shot  Needle Type: Echogenic Needle     Needle Length: 9cm  Needle Gauge: 21     Additional Needles:   Narrative:  Start time: 04/15/2017 7:07 AM End time: 04/15/2017 7:13 AM Injection made incrementally with aspirations every 5 mL.  Performed by: Personally  Anesthesiologist: Albertha Ghee, MD  Additional Notes: Pt tolerated the procedure well.

## 2017-04-16 ENCOUNTER — Encounter (HOSPITAL_COMMUNITY): Payer: Self-pay | Admitting: Orthopedic Surgery

## 2017-04-16 DIAGNOSIS — Z96651 Presence of right artificial knee joint: Secondary | ICD-10-CM | POA: Diagnosis not present

## 2017-04-16 DIAGNOSIS — M1711 Unilateral primary osteoarthritis, right knee: Secondary | ICD-10-CM | POA: Diagnosis not present

## 2017-04-16 LAB — BASIC METABOLIC PANEL
ANION GAP: 11 (ref 5–15)
BUN: 15 mg/dL (ref 6–20)
CO2: 22 mmol/L (ref 22–32)
Calcium: 9.3 mg/dL (ref 8.9–10.3)
Chloride: 104 mmol/L (ref 101–111)
Creatinine, Ser: 1.26 mg/dL — ABNORMAL HIGH (ref 0.61–1.24)
GFR calc Af Amer: >60 mL/min (ref >60)
GFR, EST NON AFRICAN AMERICAN: 59 mL/min — AB (ref >60)
Glucose, Bld: 190 mg/dL — ABNORMAL HIGH (ref 65–99)
POTASSIUM: 4.1 mmol/L (ref 3.5–5.1)
SODIUM: 137 mmol/L (ref 135–145)

## 2017-04-16 LAB — CBC
HCT: 33.5 % — ABNORMAL LOW (ref 39.0–52.0)
Hemoglobin: 11.6 g/dL — ABNORMAL LOW (ref 13.0–17.0)
MCH: 33.7 pg (ref 26.0–34.0)
MCHC: 34.6 g/dL (ref 30.0–36.0)
MCV: 97.4 fL (ref 78.0–100.0)
PLATELETS: 211 10*3/uL (ref 150–400)
RBC: 3.44 MIL/uL — AB (ref 4.22–5.81)
RDW: 13.5 % (ref 11.5–15.5)
WBC: 15.5 10*3/uL — ABNORMAL HIGH (ref 4.0–10.5)

## 2017-04-16 MED ORDER — HYDROMORPHONE HCL 2 MG PO TABS: ORAL_TABLET | ORAL | 0 refills | Status: DC

## 2017-04-16 MED ORDER — HYDROMORPHONE HCL 2 MG PO TABS
2.0000 mg | ORAL_TABLET | ORAL | Status: DC | PRN
Start: 2017-04-16 — End: 2017-04-16
  Administered 2017-04-16: 2 mg via ORAL
  Filled 2017-04-16: qty 1

## 2017-04-16 MED ORDER — OLMESARTAN MEDOXOMIL 40 MG PO TABS: ORAL_TABLET | ORAL | 1 refills | Status: DC

## 2017-04-16 MED ORDER — FUROSEMIDE 40 MG PO TABS
40.0000 mg | ORAL_TABLET | ORAL | Status: AC
  Administered 2017-04-16: 40 mg via ORAL
  Filled 2017-04-16: qty 1

## 2017-04-16 MED ORDER — ACETAMINOPHEN 325 MG PO TABS: 650.0000 mg | ORAL_TABLET | ORAL | Status: DC | PRN

## 2017-04-16 MED ORDER — POLYETHYLENE GLYCOL 3350 17 G PO PACK: PACK | ORAL | 0 refills | Status: DC

## 2017-04-16 MED ORDER — ASPIRIN 325 MG PO TBEC: DELAYED_RELEASE_TABLET | ORAL | 0 refills | Status: DC

## 2017-04-16 MED ORDER — DOCUSATE SODIUM 100 MG PO CAPS: ORAL_CAPSULE | ORAL | 0 refills | Status: DC

## 2017-04-16 NOTE — Discharge Summary (Signed)
Patient ID: Marvin Carter MRN: [id number] DOB/AGE: [birth year] 62 y.o.  Admit date: 04/15/2017 Discharge date: 04/16/2017  Admission Diagnoses:  Principal Problem:   Primary osteoarthritis of right knee Active Problems:   NSVT (nonsustained ventricular tachycardia) (HCC)   Atrial flutter, paroxysmal (HCC)   CAD (coronary artery disease), native coronary artery   Stented coronary artery   Hypertension   Mixed hyperlipidemia   GERD (gastroesophageal reflux disease)   Anxiety   Primary localized osteoarthritis of right knee   Discharge Diagnoses:  Same  Past Medical History:  Diagnosis Date  . Anxiety   . Arthritis   . Atrial flutter, paroxysmal ([place]) 01/04/2011  . CAD (coronary artery disease), native coronary artery 01/04/2011  . Diverticulitis   . Diverticulosis   . GERD (gastroesophageal reflux disease)   . Hemarthrosis of knee, right 01/04/2011  . Hemorrhoids   . History of meniscectomy of right knee 01/02/2011  . Hyperlipemia 01/04/2011  . Hyperlipidemia   . Hypertension   . Meniscus tear 01/02/2011  . NSVT (nonsustained ventricular tachycardia) ([place]) 01/02/2011  . Primary osteoarthritis of right knee 04/03/2017  . Right knee pain 01/02/2011  . Stented coronary artery 01/04/2011   LAD DES [place].  . Torn medial meniscus    right knee    Surgeries: Procedure(s): TOTAL KNEE ARTHROPLASTY on 04/15/2017   Consultants:   Discharged Condition: Improved  Hospital Course: Marvin Carter is an 62 y.o. male who was admitted 04/15/2017 for operative treatment ofPrimary osteoarthritis of right knee. Patient has severe unremitting pain that affects sleep, daily activities, and work/hobbies. After pre-op clearance the patient was taken to the operating room on 04/15/2017 and underwent  Procedure(s): TOTAL KNEE ARTHROPLASTY.    Patient was given perioperative antibiotics:  Anti-infectives (From admission, onward)   Start     Dose/Rate Route Frequency Ordered Stop    04/15/17 1330  ceFAZolin (ANCEF) IVPB 2g/100 mL premix     2 g 200 mL/hr over 30 Minutes Intravenous Every 6 hours 04/15/17 1100 04/16/17 0407   04/15/17 0610  ceFAZolin (ANCEF) 2-4 GM/100ML-% IVPB    Comments:  Nyoka Cowden   : cabinet override      04/15/17 0610 04/15/17 0726   04/15/17 0604  ceFAZolin (ANCEF) IVPB 2g/100 mL premix     2 g 200 mL/hr over 30 Minutes Intravenous On call to O.R. 04/15/17 7902 04/15/17 4097       Patient was given sequential compression devices, early ambulation, and chemoprophylaxis to prevent DVT.  Patient benefited maximally from hospital stay and there were no complications.    Recent vital signs:  Patient Vitals for the past 24 hrs:  BP Temp Temp src Pulse Resp SpO2  04/16/17 0435 119/66 97.9 F (36.6 C) Oral 80 16 98 %  04/15/17 2138 130/65 98.1 F (36.7 C) Oral 69 17 100 %  04/15/17 1104 (!) 134/113 98.1 F (36.7 C) Oral (!) 57 16 96 %  04/15/17 1048 - 97.7 F (36.5 C) - - - -  04/15/17 1030 - - - (!) 52 (!) 9 93 %  04/15/17 1000 - - - (!) 48 18 96 %  04/15/17 0950 127/67 - - (!) 49 13 96 %  04/15/17 0934 136/70 (!) 97.3 F (36.3 C) - (!) 52 13 98 %     Recent laboratory studies:  Recent Labs    04/16/17 0600  WBC 15.5*  HGB 11.6*  HCT 33.5*  PLT 211  NA 137  K 4.1  CL 104  CO2 22  BUN 15  CREATININE 1.26*  GLUCOSE 190*  CALCIUM 9.3     Discharge Medications:   Allergies as of 04/16/2017      Reactions   Hctz [hydrochlorothiazide]    Tape Rash   Clear tape      Medication List    STOP taking these medications   azithromycin 250 MG tablet Commonly known as:  ZITHROMAX   B-12 PO   B-complex with vitamin C tablet   B-D 3CC LUER-LOK SYR 21GX1" 21G X 1" 3 ML Misc Generic drug:  SYRINGE-NEEDLE (DISP) 3 ML   fish oil-omega-3 fatty acids 1000 MG capsule   HYDROcodone-acetaminophen 10-325 MG tablet Commonly known as:  NORCO   meloxicam 15 MG tablet Commonly known as:  MOBIC   predniSONE 20 MG  tablet Commonly known as:  DELTASONE   testosterone cypionate 200 MG/ML injection Commonly known as:  DEPOTESTOSTERONE CYPIONATE   VITAMIN E PO     TAKE these medications   acetaminophen 325 MG tablet Commonly known as:  TYLENOL Take 2 tablets (650 mg total) by mouth every 4 (four) hours as needed for mild pain ((score 1 to 3) or temp > 100.5).   ALPRAZolam 1 MG tablet Commonly known as:  XANAX Take 1/2 to 1 tablet 2 to 3 x / day Only if needed for Anxiety Attack and please try to limit to 5 days /week to avoid addiction What changed:    how much to take  how to take this  when to take this  additional instructions   aspirin 325 MG EC tablet 1 tab a day for the next 30 days to prevent blood clots ok to take with Plavix What changed:    medication strength  how much to take  how to take this  when to take this  additional instructions   atenolol 100 MG tablet Commonly known as:  TENORMIN Take 1 tablet (100 mg total) by mouth daily.   atorvastatin 40 MG tablet Commonly known as:  LIPITOR TAKE 1 TABLET BY MOUTH EVERY DAY FOR CHOLESTEROL What changed:    how much to take  how to take this  when to take this  additional instructions   buPROPion 300 MG 24 hr tablet Commonly known as:  WELLBUTRIN XL TAKE 1 TABLET BY MOUTH  DAILY FOR MOOD What changed:    how much to take  how to take this  when to take this  additional instructions   clopidogrel 75 MG tablet Commonly known as:  PLAVIX Take 1 tablet (75 mg total) by mouth daily.   diclofenac sodium 1 % Gel Commonly known as:  VOLTAREN Apply 2-4 g topically 4 (four) times daily as needed (for knee pain.).   diltiazem 240 MG 24 hr capsule Commonly known as:  CARDIZEM CD TAKE 1 CAPSULE BY MOUTH  DAILY FOR BLOOD PRESSURE What changed:    how much to take  how to take this  when to take this  additional instructions   docusate sodium 100 MG capsule Commonly known as:  COLACE .1 tab 2  times a day while on narcotics.  STOOL SOFTENER   doxazosin 8 MG tablet Commonly known as:  CARDURA TAKE 1/2 TO 1 TABLET DAILY FOR BP & PROSTATE What changed:    how much to take  how to take this  when to take this  additional instructions   fenofibrate 160 MG tablet Take 1 tablet (160 mg total) by mouth daily. What changed:  how much  to take   furosemide 80 MG tablet Commonly known as:  LASIX TAKE 1 TABLET BY MOUTH EVERY DAY FOR BLOOD PRESSURE AND FLUID What changed:    how much to take  how to take this  when to take this  additional instructions   HYDROmorphone 2 MG tablet Commonly known as:  DILAUDID 1 po q 4 hrs prn pain.  Patient had a total knee replacement on 04/15/2017   multivitamin with minerals Tabs tablet Take 2 tablets by mouth daily. Mega Men Multivitamin.   nitroGLYCERIN 0.4 MG SL tablet Commonly known as:  NITROSTAT PLACE 1 TABLET (0.4 MG TOTAL) UNDER THE TONGUE EVERY 5 (FIVE) MINUTES AS NEEDED FOR CHEST PAIN.   olmesartan 40 MG tablet Commonly known as:  BENICAR DO NOT TAKE THIS MEDICATION UNLESS BP IS GREATER THAN 140/90 What changed:    how much to take  how to take this  when to take this  additional instructions   pantoprazole 40 MG tablet Commonly known as:  PROTONIX Take 40 mg by mouth daily as needed.   polyethylene glycol packet Commonly known as:  MIRALAX / GLYCOLAX 17grams in 6 oz of drink twice a day until bowel movement.  LAXITIVE.  Restart if two days since last bowel movement   ranitidine 75 MG tablet Commonly known as:  ZANTAC Take 150 mg by mouth daily as needed for heartburn.   VITAMIN C PO Take 1 tablet by mouth daily as needed (for immune system support).            Discharge Care Instructions  (From admission, onward)        Start     Ordered   04/16/17 0000  Change dressing    Comments:  DO NOT REMOVE BANDAGE OVER SURGICAL INCISION.  [place] WHOLE LEG INCLUDING OVER THE WATERPROOF BANDAGE WITH SOAP  AND WATER EVERY DAY.   04/16/17 0925      Diagnostic Studies: No results found.  Disposition: 01-Home or Self Care  Discharge Instructions    CPM   Complete by:  As directed    Continuous passive motion machine (CPM):      Use the CPM from 0 to 90 for 6 hours per day.       You may break it up into 2 or 3 sessions per day.      Use CPM for 2 weeks or until you are told to stop.   Call MD / Call 911   Complete by:  As directed    If you experience chest pain or shortness of breath, CALL 911 and be transported to the hospital emergency room.  If you develope a fever above 101 F, pus (white drainage) or increased drainage or redness at the wound, or calf pain, call your surgeon's office.   Change dressing   Complete by:  As directed    DO NOT REMOVE BANDAGE OVER SURGICAL INCISION.  [place] WHOLE LEG INCLUDING OVER THE WATERPROOF BANDAGE WITH SOAP AND WATER EVERY DAY.   Constipation Prevention   Complete by:  As directed    Drink plenty of fluids.  Prune juice may be helpful.  You may use a stool softener, such as Colace (over the counter) 100 mg twice a day.  Use MiraLax (over the counter) for constipation as needed.   Diet - low sodium heart healthy   Complete by:  As directed    Discharge instructions   Complete by:  As directed    [place]  REPLACEMENT   Remove items at home which could result in a fall. This includes throw rugs or furniture in walking pathways ICE to the affected joint every three hours while awake for 30 minutes at a time, for at least the first 3-5 days, and then as needed for pain and swelling.  Continue to use ice for pain and swelling. You may notice swelling that will progress down to the foot and ankle.  This is normal after surgery.  Elevate your leg when you are not up walking on it.   Continue to use the breathing machine you got in the hospital (incentive spirometer) which will help keep your temperature down.  It is common for your  temperature to cycle up and down following surgery, especially at night when you are not up moving around and exerting yourself.  The breathing machine keeps your lungs expanded and your temperature down.   DIET:  As you were doing prior to hospitalization, we recommend a well-balanced diet.  DRESSING / WOUND CARE / SHOWERING  Keep the surgical dressing until follow up.  The dressing is water proof, so you can shower without any extra covering.  IF THE DRESSING FALLS OFF or the wound gets wet inside, change the dressing with sterile gauze.  Please use good hand washing techniques before changing the dressing.  Do not use any lotions or creams on the incision until instructed by your surgeon.    ACTIVITY  Increase activity slowly as tolerated, but follow the weight bearing instructions below.   No driving for 6 weeks or until further direction given by your physician.  You cannot drive while taking narcotics.  No lifting or carrying greater than 10 lbs. until further directed by your surgeon. Avoid periods of inactivity such as sitting longer than an hour when not asleep. This helps prevent blood clots.  You may return to work once you are authorized by your doctor.     WEIGHT BEARING   Weight bearing as tolerated with assist device (walker, cane, etc) as directed, use it as long as suggested by your surgeon or therapist, typically at least 2-3 weeks.   EXERCISES  Results after joint replacement surgery are often greatly improved when you follow the exercise, range of motion and muscle strengthening exercises prescribed by your doctor. Safety measures are also important to protect the joint from further injury. Any time any of these exercises cause you to have increased pain or swelling, decrease what you are doing until you are comfortable again and then slowly increase them. If you have problems or questions, call your caregiver or physical therapist for advice.   Rehabilitation is  important following a joint replacement. After just a few days of immobilization, the muscles of the leg can become weakened and shrink (atrophy).  These exercises are designed to build up the tone and strength of the thigh and leg muscles and to improve motion. Often times heat used for twenty to thirty minutes before working out will loosen up your tissues and help with improving the range of motion but do not use heat for the first two weeks following surgery (sometimes heat can increase post-operative swelling).   These exercises can be done on a training (exercise) mat, on the floor, on a table or on a bed. Use whatever works the best and is most comfortable for you.    Use music or television while you are exercising so that the exercises are a pleasant break in your day. This  will make your life better with the exercises acting as a break in your routine that you can look forward to.   Perform all exercises about fifteen times, three times per day or as directed.  You should exercise both the operative leg and the other leg as well.   Exercises include:  Quad Sets - Tighten up the muscle on the front of the thigh (Quad) and hold for 5-10 seconds.   Straight Leg Raises - With your knee straight (if you were given a brace, keep it on), lift the leg to 60 degrees, hold for 3 seconds, and slowly lower the leg.  Perform this exercise against resistance later as your leg gets stronger.  Leg Slides: Lying on your back, slowly slide your foot toward your buttocks, bending your knee up off the floor (only go as far as is comfortable). Then slowly slide your foot back down until your leg is flat on the floor again.  Angel Wings: Lying on your back spread your legs to the side as far apart as you can without causing discomfort.  Hamstring Strength:  Lying on your back, push your heel against the floor with your leg straight by tightening up the muscles of your buttocks.  Repeat, but this time bend your knee to  a comfortable angle, and push your heel against the floor.  You may put a pillow under the heel to make it more comfortable if necessary.   A rehabilitation program following joint replacement surgery can speed recovery and prevent re-injury in the future due to weakened muscles. Contact your doctor or a physical therapist for more information on knee rehabilitation.    CONSTIPATION  Constipation is defined medically as fewer than three stools per week and severe constipation as less than one stool per week.  Even if you have a regular bowel pattern at home, your normal regimen is likely to be disrupted due to multiple reasons following surgery.  Combination of anesthesia, postoperative narcotics, change in appetite and fluid intake all can affect your bowels.   YOU MUST use at least one of the following options; they are listed in order of increasing strength to get the job done.  They are all available over the counter, and you may need to use some, POSSIBLY even all of these options:    Drink plenty of fluids (prune juice may be helpful) and high fiber foods Colace 100 mg by mouth twice a day  Senokot for constipation as directed and as needed Dulcolax (bisacodyl), take with full glass of water  Miralax (polyethylene glycol) once or twice a day as needed.  If you have tried all these things and are unable to have a bowel movement in the first 3-4 days after surgery call either your surgeon or your primary doctor.    If you experience loose stools or diarrhea, hold the medications until you stool forms back up.  If your symptoms do not get better within 1 week or if they get worse, check with your doctor.  If you experience "the worst abdominal pain ever" or develop nausea or vomiting, please contact the office immediately for further recommendations for treatment.   ITCHING:  If you experience itching with your medications, try taking only a single pain pill, or even half a pain pill at a  time.  You can also use Benadryl over the counter for itching or also to help with sleep.   TED HOSE STOCKINGS:  Use stockings on both legs until  for at least 2 weeks or as directed by physician office. They may be removed at night for sleeping.  MEDICATIONS:  See your medication summary on the "After Visit Summary" that nursing will review with you.  You may have some home medications which will be placed on hold until you complete the course of blood thinner medication.  It is important for you to complete the blood thinner medication as prescribed.  PRECAUTIONS:  If you experience chest pain or shortness of breath - call 911 immediately for transfer to the hospital emergency department.   If you develop a fever greater that 101 F, purulent drainage from wound, increased redness or drainage from wound, foul odor from the wound/dressing, or calf pain - CONTACT YOUR SURGEON.                                                   FOLLOW-UP APPOINTMENTS:  If you do not already have a post-op appointment, please call the office for an appointment to be seen by your surgeon.  Guidelines for how soon to be seen are listed in your "After Visit Summary", but are typically between 1-4 weeks after surgery.  OTHER INSTRUCTIONS:   Knee Replacement:  Do not place pillow under knee, focus on keeping the knee straight while resting. CPM instructions: 0-90 degrees, 2 hours in the morning, 2 hours in the afternoon, and 2 hours in the evening. Place foam block, curve side up under heel at all times except when in CPM or when walking.  DO NOT modify, tear, cut, or change the foam block in any way.  MAKE SURE YOU:  Understand these instructions.  Get help right away if you are not doing well or get worse.    Thank you for letting us be a part of your medical care team.  It is a privilege we respect greatly.  We hope these instructions will help you stay on track for a fast and full recovery!   Do not put a pillow  under the knee. Place it under the heel.   Complete by:  As directed    Place gray foam block, curve side up under heel at all times except when in CPM or when walking.  DO NOT modify, tear, cut, or change in any way the gray foam block.   Increase activity slowly as tolerated   Complete by:  As directed    Patient may shower   Complete by:  As directed    Aquacel dressing is water proof    Wash over it and the whole leg with soap and water at the end of your shower   TED hose   Complete by:  As directed    Use stockings (TED hose) for 2 weeks on both leg(s).  You may remove them at night for sleeping.      Follow-up Information    Elsie Saas, MD Follow up on 04/29/2017.   Specialty:  Orthopedic Surgery Why:  appt at 3:45 pm Contact information: [place] [address] [phone]        [facility], [name] Follow up on 04/30/2017.   Specialty:  Orthopedic Surgery Why:  arrive at 11:30 for 12 pm appointment with Robin in physical therapy at Dr Archie Endo office  Contact information: [place]  [place] [address] [phone]            Signed: Linda Hedges 04/16/2017, 9:26 AM

## 2017-04-16 NOTE — Plan of Care (Signed)
  Nutrition: Adequate nutrition will be maintained 04/16/2017 0948 - Progressing by Williams Che, RN   Elimination: Will not experience complications related to bowel motility 04/16/2017 0948 - Progressing by Williams Che, RN   Pain Managment: General experience of comfort will improve 04/16/2017 0948 - Progressing by Williams Che, RN   Safety: Ability to remain free from injury will improve 04/16/2017 0948 - Progressing by Williams Che, RN

## 2017-04-16 NOTE — Progress Notes (Signed)
Physical Therapy Treatment Patient Details Name: Marvin Carter MRN: [id number] DOB: [birth year] Today's Date: 04/16/2017    History of Present Illness Pt is a 62 y/o male s/p elective R TKA. PMH includes a flutter, CAD s/p stent, HTN, and bilateral rotator cuff repair.     PT Comments    Continuing work on functional mobility and activity tolerance;  Much improved R knee stance stability compared to yesterday's eval; Covered progressive amb, stair training (RW and crutches), and therex; questions solicited and answered; OK for dc home from PT standpoint, RN notified   Follow Up Recommendations  Follow surgeon's recommendation for DC plan and follow-up therapies;Supervision for mobility/OOB     Equipment Recommendations  None recommended by PT    Recommendations for Other Services       Precautions / Restrictions Precautions Precautions: Knee Precaution Booklet Issued: Yes (comment) Precaution Comments: Reviewed supine knee ther ex with pt.  Required Braces or Orthoses: Knee Immobilizer - Right Knee Immobilizer - Right: Other (comment)(until good quad control) Restrictions RLE Weight Bearing: Weight bearing as tolerated    Mobility  Bed Mobility                  Transfers Overall transfer level: Needs assistance Equipment used: Rolling walker (2 wheeled) Transfers: Sit to/from Stand Sit to Stand: Min guard;Supervision         General transfer comment: Min guard for safety. Verbal cues for safe hand placement. Progressed to supervision  Ambulation/Gait Ambulation/Gait assistance: Min guard;Supervision Ambulation Distance (Feet): 200 Feet Assistive device: Rolling walker (2 wheeled) Gait Pattern/deviations: Step-through pattern(emerging) Gait velocity: Decreased    General Gait Details: verbal and demo cues for full hip and knee extension in stance; Nice, stable knee; no buckling   Stairs Stairs: Yes   Stair Management: No rails;Backwards;With  walker;Forwards;With crutches Number of Stairs: 2(x2; first with RW backwards, then with crutches forward) General stair comments: verbal and demo cues for technique and sequence; managing well  Wheelchair Mobility    Modified Rankin (Stroke Patients Only)       Balance                                            Cognition Arousal/Alertness: Awake/alert Behavior During Therapy: WFL for tasks assessed/performed Overall Cognitive Status: Within Functional Limits for tasks assessed                                        Exercises Total Joint Exercises Ankle Circles/Pumps: AROM;Both;20 reps;Supine Quad Sets: AROM;Right;10 reps Short Arc Quad: AROM;Right;10 reps Heel Slides: AROM;Right;10 reps(some self-AAROM using bil UEs) Straight Leg Raises: AROM;Right;10 reps Goniometric ROM: approx -1 to 79 deg    General Comments General comments (skin integrity, edema, etc.): Discussed car transfer into [employer]      Pertinent Vitals/Pain Pain Assessment: 0-10 Pain Score: 6  Pain Location: R knee  Pain Descriptors / Indicators: Aching;Operative site guarding Pain Intervention(s): Monitored during session    Home Living                      Prior Function            PT Goals (current goals can now be found in the care plan section) Acute Rehab PT Goals Patient Stated  Goal: to go home  PT Goal Formulation: With patient Time For Goal Achievement: 04/29/17 Potential to Achieve Goals: Good Progress towards PT goals: Progressing toward goals    Frequency    7X/week      PT Plan Current plan remains appropriate    Co-evaluation              AM-PAC PT "6 Clicks" Daily Activity  Outcome Measure  Difficulty turning over in bed (including adjusting bedclothes, sheets and blankets)?: A Little Difficulty moving from lying on back to sitting on the side of the bed? : A Little Difficulty sitting down on and standing up from  a chair with arms (e.g., wheelchair, bedside commode, etc,.)?: A Little Help needed moving to and from a bed to chair (including a wheelchair)?: A Little Help needed walking in hospital room?: A Little Help needed climbing 3-5 steps with a railing? : A Little 6 Click Score: 18    End of Session Equipment Utilized During Treatment: Gait belt Activity Tolerance: Patient tolerated treatment well Patient left: in chair;with call bell/phone within reach;with family/visitor present Nurse Communication: Mobility status;Other (comment)(OK for dc from PT standpoint) PT Visit Diagnosis: Other abnormalities of gait and mobility (R26.89);Pain Pain - Right/Left: Right Pain - part of body: Knee     Time: 1022-1100 PT Time Calculation (min) (ACUTE ONLY): 38 min  Charges:  $Gait Training: 8-22 mins $Therapeutic Exercise: 8-22 mins $Therapeutic Activity: 8-22 mins                    G Codes:       Roney Marion, PT  Acute Rehabilitation Services Pager [phone] Office [place] 04/16/2017, 12:12 PM

## 2017-04-16 NOTE — Progress Notes (Signed)
04/16/17 1600  OT Visit Information  Last OT Received On 04/16/17  Assistance Needed +1  History of Present Illness Pt is a 62 y/o male s/p elective R TKA. PMH includes a flutter, CAD s/p stent, HTN, and bilateral rotator cuff repair.   Precautions  Precautions Knee  Precaution Booklet Issued Yes (comment)  Precaution Comments Reviewed knee extension and foam block  Knee Immobilizer - Right (until good quad control)  Restrictions  Weight Bearing Restrictions Yes  RLE Weight Bearing WBAT  Home Living  Family/patient expects to be discharged to: Private residence  Living Arrangements Spouse/significant other  Available Help at Discharge Family;Available 24 hours/day  Type of Home House  Home Access Stairs to enter  Entrance Stairs-Number of Steps 3-4  Entrance Stairs-Rails None  Home Layout Two level;Able to live on main level with bedroom/bathroom  Animal nutritionist - 2 wheels;BSC;Other (comment);Cane - single point (CPM )  Prior Function  Level of Independence Independent  Communication  Communication No difficulties  Pain Assessment  Pain Assessment 0-10  Pain Score 6  Pain Location R knee   Pain Descriptors / Indicators Aching;Operative site guarding  Pain Intervention(s) Monitored during session;Premedicated before session;Repositioned;Ice applied  Cognition  Arousal/Alertness Awake/alert  Behavior During Therapy WFL for tasks assessed/performed  Overall Cognitive Status Within Functional Limits for tasks assessed  Upper Extremity Assessment  Upper Extremity Assessment Overall WFL for tasks assessed  Lower Extremity Assessment  Lower Extremity Assessment Defer to PT evaluation  Cervical / Trunk Assessment  Cervical / Trunk Assessment Normal  ADL  Overall ADL's  Modified independent  General ADL Comments pt able to reach feet, dressed on arrival and able to verbalize. pt demonstrates shower demo with  wife. educated on sleeping poisiontined  Bed Mobility  General bed mobility comments in chair on arrival  Transfers  Overall transfer level Needs assistance  Equipment used Rolling walker (2 wheeled)  Transfers Sit to/from Stand  Sit to Stand Supervision  Balance  Overall balance assessment Needs assistance  Sitting-balance support No upper extremity supported;Feet supported  Sitting balance-Leahy Scale Good  Standing balance support Bilateral upper extremity supported;During functional activity  Standing balance-Leahy Scale Poor  Standing balance comment Reliant on BUE support.  Total Joint Exercises  Heel Slides (some self-AAROM using bil UEs)  OT - End of Session  Equipment Utilized During Treatment Gait belt;Rolling walker  Activity Tolerance Patient tolerated treatment well  Patient left in chair;with call bell/phone within reach;with family/visitor present  Nurse Communication Mobility status;Precautions  CPM Right Knee  CPM Right Knee Off  OT Assessment  OT Recommendation/Assessment Patient does not need any further OT services  AM-PAC OT "6 Clicks" Daily Activity Outcome Measure  Help from another person eating meals? 4  Help from another person taking care of personal grooming? 4  Help from another person toileting, which includes using toliet, bedpan, or urinal? 4  Help from another person bathing (including washing, rinsing, drying)? 4  Help from another person to put on and taking off regular upper body clothing? 4  Help from another person to put on and taking off regular lower body clothing? 4  6 Click Score 24  ADL G Code Conversion CH  OT Recommendation  Follow Up Recommendations No OT follow up  OT Equipment None recommended by OT  Acute Rehab OT Goals  Patient Stated Goal to go home   OT Time Calculation  OT Start Time (ACUTE ONLY) [phone]  OT Stop Time (ACUTE ONLY) 1016  OT Time Calculation (min) 26 min  OT General Charges  $OT Visit 1 Visit  OT  Evaluation  $OT Eval Moderate Complexity 1 Mod  Written Expression  Dominant Hand Right  Educated patient on knee full extension with return demonstration, educated tub/shower transfer,never to wash directly on incision site, always use fresh clean linen (one time use then place in laundry), avoid water under bandage and benefits of wrapping dressing, sleeping positioning, avoid putting pillow under knee,     Jeri Modena   OTR/L Pager: [phone] Office: [phone] .

## 2017-04-16 NOTE — Progress Notes (Signed)
Removed IV, provided discharge education/instructions, all questions and concerns addressed, Pt not in distress, discharged home with belongings accompanied by wife. 

## 2017-04-16 NOTE — Care Management Note (Signed)
Case Management Note  Patient Details  Name: Everton Bertha MRN: [id number] Date of Birth: [birth year]  Subjective/Objective:   62 yr old gentleman s/p left total knee arthroplasty.                  Action/Plan: Patient was preoperatively setup with [facility], no changes. He will have family support at discharge.     Expected Discharge Date:  04/16/17               Expected Discharge Plan:  [place]  In-House Referral:  NA  Discharge planning Services  CM Consult  Post Acute Care Choice:  Durable Medical Equipment, Home Health Choice offered to:  Patient  DME Arranged:  3-N-1, Walker rolling, CPM DME Agency:  [employer]  HH Arranged:  PT [place]:  [facility] [employer]  Status of Service:  Completed, signed off  If discussed at [employer], dates discussed:    Additional Comments:  Ninfa Meeker, RN 04/16/2017, 11:47 AM

## 2017-04-16 NOTE — Anesthesia Postprocedure Evaluation (Signed)
Anesthesia Post Note  Patient: Marvin Carter  Procedure(s) Performed: TOTAL KNEE ARTHROPLASTY (Right Knee)     Patient location during evaluation: PACU Anesthesia Type: Spinal Level of consciousness: oriented and awake and alert Pain management: pain level controlled Vital Signs Assessment: post-procedure vital signs reviewed and stable Respiratory status: spontaneous breathing, respiratory function stable and patient connected to nasal cannula oxygen Cardiovascular status: blood pressure returned to baseline and stable Postop Assessment: no headache, no backache and no apparent nausea or vomiting Anesthetic complications: no    Last Vitals:  Vitals:   04/15/17 2138 04/16/17 0435  BP: 130/65 119/66  Pulse: 69 80  Resp: 17 16  Temp: 36.7 C 36.6 C  SpO2: 100% 98%    Last Pain:  Vitals:   04/16/17 0603  TempSrc:   PainSc: [place]

## 2017-04-17 ENCOUNTER — Encounter (HOSPITAL_COMMUNITY): Payer: Self-pay

## 2017-04-17 DIAGNOSIS — I2510 Atherosclerotic heart disease of native coronary artery without angina pectoris: Secondary | ICD-10-CM | POA: Diagnosis not present

## 2017-04-17 DIAGNOSIS — I10 Essential (primary) hypertension: Secondary | ICD-10-CM | POA: Diagnosis not present

## 2017-04-17 DIAGNOSIS — Z471 Aftercare following joint replacement surgery: Secondary | ICD-10-CM | POA: Diagnosis not present

## 2017-04-19 ENCOUNTER — Telehealth: Payer: Self-pay | Admitting: *Deleted

## 2017-04-19 ENCOUNTER — Encounter (HOSPITAL_COMMUNITY): Payer: Self-pay | Attending: Cardiovascular Disease

## 2017-04-19 DIAGNOSIS — Z955 Presence of coronary angioplasty implant and graft: Secondary | ICD-10-CM | POA: Insufficient documentation

## 2017-04-19 DIAGNOSIS — Z471 Aftercare following joint replacement surgery: Secondary | ICD-10-CM | POA: Diagnosis not present

## 2017-04-19 DIAGNOSIS — I10 Essential (primary) hypertension: Secondary | ICD-10-CM | POA: Diagnosis not present

## 2017-04-19 DIAGNOSIS — I2510 Atherosclerotic heart disease of native coronary artery without angina pectoris: Secondary | ICD-10-CM | POA: Diagnosis not present

## 2017-04-19 NOTE — Telephone Encounter (Signed)
Patient called and states, due to his recent surgery, he is having difficulty urinating.  Patient asked to increase his Doxazosin from 4 mg to a whole 8 mg tablet and stop his Benicar 1/2 tablet.  Per Dr Melford Aase, it is OK to increase to Doxazosin 8 mg, but continue his Benicar and monitor his BP.Patient is aware and home health nurse will check his BP.

## 2017-04-22 ENCOUNTER — Encounter (HOSPITAL_COMMUNITY): Payer: Self-pay

## 2017-04-22 DIAGNOSIS — I10 Essential (primary) hypertension: Secondary | ICD-10-CM | POA: Diagnosis not present

## 2017-04-22 DIAGNOSIS — I2510 Atherosclerotic heart disease of native coronary artery without angina pectoris: Secondary | ICD-10-CM | POA: Diagnosis not present

## 2017-04-22 DIAGNOSIS — Z471 Aftercare following joint replacement surgery: Secondary | ICD-10-CM | POA: Diagnosis not present

## 2017-04-24 ENCOUNTER — Encounter (HOSPITAL_COMMUNITY): Payer: Self-pay

## 2017-04-24 DIAGNOSIS — I10 Essential (primary) hypertension: Secondary | ICD-10-CM | POA: Diagnosis not present

## 2017-04-24 DIAGNOSIS — I2510 Atherosclerotic heart disease of native coronary artery without angina pectoris: Secondary | ICD-10-CM | POA: Diagnosis not present

## 2017-04-24 DIAGNOSIS — Z471 Aftercare following joint replacement surgery: Secondary | ICD-10-CM | POA: Diagnosis not present

## 2017-04-26 ENCOUNTER — Encounter (HOSPITAL_COMMUNITY): Payer: Self-pay

## 2017-04-26 DIAGNOSIS — I10 Essential (primary) hypertension: Secondary | ICD-10-CM | POA: Diagnosis not present

## 2017-04-26 DIAGNOSIS — Z471 Aftercare following joint replacement surgery: Secondary | ICD-10-CM | POA: Diagnosis not present

## 2017-04-26 DIAGNOSIS — I2510 Atherosclerotic heart disease of native coronary artery without angina pectoris: Secondary | ICD-10-CM | POA: Diagnosis not present

## 2017-04-29 ENCOUNTER — Encounter (HOSPITAL_COMMUNITY): Payer: Self-pay

## 2017-04-29 DIAGNOSIS — M1711 Unilateral primary osteoarthritis, right knee: Secondary | ICD-10-CM | POA: Diagnosis not present

## 2017-04-29 DIAGNOSIS — I10 Essential (primary) hypertension: Secondary | ICD-10-CM | POA: Diagnosis not present

## 2017-04-29 DIAGNOSIS — I2510 Atherosclerotic heart disease of native coronary artery without angina pectoris: Secondary | ICD-10-CM | POA: Diagnosis not present

## 2017-04-29 DIAGNOSIS — Z471 Aftercare following joint replacement surgery: Secondary | ICD-10-CM | POA: Diagnosis not present

## 2017-04-30 DIAGNOSIS — M1711 Unilateral primary osteoarthritis, right knee: Secondary | ICD-10-CM | POA: Diagnosis not present

## 2017-04-30 DIAGNOSIS — Z471 Aftercare following joint replacement surgery: Secondary | ICD-10-CM | POA: Diagnosis not present

## 2017-04-30 DIAGNOSIS — R262 Difficulty in walking, not elsewhere classified: Secondary | ICD-10-CM | POA: Diagnosis not present

## 2017-05-01 ENCOUNTER — Other Ambulatory Visit: Payer: Self-pay | Admitting: Internal Medicine

## 2017-05-01 ENCOUNTER — Encounter (HOSPITAL_COMMUNITY): Payer: Self-pay

## 2017-05-01 ENCOUNTER — Other Ambulatory Visit: Payer: Self-pay | Admitting: Physician Assistant

## 2017-05-01 DIAGNOSIS — M7989 Other specified soft tissue disorders: Secondary | ICD-10-CM

## 2017-05-01 DIAGNOSIS — Z96651 Presence of right artificial knee joint: Secondary | ICD-10-CM

## 2017-05-01 DIAGNOSIS — M79604 Pain in right leg: Secondary | ICD-10-CM

## 2017-05-02 ENCOUNTER — Other Ambulatory Visit: Payer: Self-pay | Admitting: Internal Medicine

## 2017-05-02 ENCOUNTER — Ambulatory Visit (HOSPITAL_COMMUNITY)
Admission: RE | Admit: 2017-05-02 | Discharge: 2017-05-02 | Disposition: A | Discharge status: Home / Self Care | Payer: 59 | Source: Ambulatory Visit | Attending: Physician Assistant | Admitting: Physician Assistant

## 2017-05-02 DIAGNOSIS — M7989 Other specified soft tissue disorders: Secondary | ICD-10-CM | POA: Diagnosis not present

## 2017-05-02 DIAGNOSIS — Z471 Aftercare following joint replacement surgery: Secondary | ICD-10-CM | POA: Diagnosis not present

## 2017-05-02 DIAGNOSIS — Z96651 Presence of right artificial knee joint: Secondary | ICD-10-CM | POA: Diagnosis not present

## 2017-05-02 DIAGNOSIS — M1711 Unilateral primary osteoarthritis, right knee: Secondary | ICD-10-CM | POA: Diagnosis not present

## 2017-05-02 DIAGNOSIS — M79604 Pain in right leg: Secondary | ICD-10-CM | POA: Diagnosis not present

## 2017-05-02 DIAGNOSIS — R262 Difficulty in walking, not elsewhere classified: Secondary | ICD-10-CM | POA: Diagnosis not present

## 2017-05-02 NOTE — Progress Notes (Signed)
Right lower extremity venous duplex completed. No evidence of DVT, superficial thrombosis, or Baker's cyst. Toma Copier, RVS 05/02/2017, 10:48 AM

## 2017-05-03 ENCOUNTER — Encounter (HOSPITAL_COMMUNITY): Payer: Self-pay

## 2017-05-06 ENCOUNTER — Encounter (HOSPITAL_COMMUNITY): Payer: Self-pay

## 2017-05-06 DIAGNOSIS — Z471 Aftercare following joint replacement surgery: Secondary | ICD-10-CM | POA: Diagnosis not present

## 2017-05-06 DIAGNOSIS — M1711 Unilateral primary osteoarthritis, right knee: Secondary | ICD-10-CM | POA: Diagnosis not present

## 2017-05-06 DIAGNOSIS — R262 Difficulty in walking, not elsewhere classified: Secondary | ICD-10-CM | POA: Diagnosis not present

## 2017-05-08 ENCOUNTER — Encounter (HOSPITAL_COMMUNITY): Payer: Self-pay

## 2017-05-08 DIAGNOSIS — Z471 Aftercare following joint replacement surgery: Secondary | ICD-10-CM | POA: Diagnosis not present

## 2017-05-08 DIAGNOSIS — R262 Difficulty in walking, not elsewhere classified: Secondary | ICD-10-CM | POA: Diagnosis not present

## 2017-05-08 DIAGNOSIS — M1711 Unilateral primary osteoarthritis, right knee: Secondary | ICD-10-CM | POA: Diagnosis not present

## 2017-05-10 ENCOUNTER — Encounter (HOSPITAL_COMMUNITY): Payer: Self-pay

## 2017-05-10 DIAGNOSIS — R262 Difficulty in walking, not elsewhere classified: Secondary | ICD-10-CM | POA: Diagnosis not present

## 2017-05-10 DIAGNOSIS — Z471 Aftercare following joint replacement surgery: Secondary | ICD-10-CM | POA: Diagnosis not present

## 2017-05-10 DIAGNOSIS — M1711 Unilateral primary osteoarthritis, right knee: Secondary | ICD-10-CM | POA: Diagnosis not present

## 2017-05-13 ENCOUNTER — Encounter (HOSPITAL_COMMUNITY): Payer: Self-pay

## 2017-05-13 DIAGNOSIS — R262 Difficulty in walking, not elsewhere classified: Secondary | ICD-10-CM | POA: Diagnosis not present

## 2017-05-13 DIAGNOSIS — Z471 Aftercare following joint replacement surgery: Secondary | ICD-10-CM | POA: Diagnosis not present

## 2017-05-13 DIAGNOSIS — M1711 Unilateral primary osteoarthritis, right knee: Secondary | ICD-10-CM | POA: Diagnosis not present

## 2017-05-15 ENCOUNTER — Encounter (HOSPITAL_COMMUNITY): Payer: Self-pay

## 2017-05-15 ENCOUNTER — Telehealth: Payer: Self-pay | Admitting: Cardiovascular Disease

## 2017-05-15 DIAGNOSIS — Z471 Aftercare following joint replacement surgery: Secondary | ICD-10-CM | POA: Diagnosis not present

## 2017-05-15 DIAGNOSIS — R262 Difficulty in walking, not elsewhere classified: Secondary | ICD-10-CM | POA: Diagnosis not present

## 2017-05-15 DIAGNOSIS — M1711 Unilateral primary osteoarthritis, right knee: Secondary | ICD-10-CM | POA: Diagnosis not present

## 2017-05-15 NOTE — Telephone Encounter (Signed)
New Message:   Pt calling to be able to stay in cardic rehab. Pt states he graduated from previous class but like to attend the 2:45 class. Pt also states that [employer] sent over a request for this and not received a response.

## 2017-05-15 NOTE — Telephone Encounter (Signed)
Returned the call to the patient. He stated that he had graduated out of the current rehab class but feels like he still needs rehab. A fax had been sent to the office for renewal. Message routed to the provider's assistant for her knowledge.

## 2017-05-17 ENCOUNTER — Encounter (HOSPITAL_COMMUNITY): Payer: Self-pay

## 2017-05-17 DIAGNOSIS — M1711 Unilateral primary osteoarthritis, right knee: Secondary | ICD-10-CM | POA: Diagnosis not present

## 2017-05-17 DIAGNOSIS — R262 Difficulty in walking, not elsewhere classified: Secondary | ICD-10-CM | POA: Diagnosis not present

## 2017-05-17 DIAGNOSIS — Z471 Aftercare following joint replacement surgery: Secondary | ICD-10-CM | POA: Diagnosis not present

## 2017-05-17 NOTE — Telephone Encounter (Signed)
New Message:     Primus Bravo calling from rehab center to confirm we have received fax for additional rehab for pt.

## 2017-05-18 ENCOUNTER — Other Ambulatory Visit: Payer: Self-pay | Admitting: Internal Medicine

## 2017-05-20 ENCOUNTER — Encounter (HOSPITAL_COMMUNITY): Payer: Self-pay

## 2017-05-20 DIAGNOSIS — R262 Difficulty in walking, not elsewhere classified: Secondary | ICD-10-CM | POA: Diagnosis not present

## 2017-05-20 DIAGNOSIS — Z471 Aftercare following joint replacement surgery: Secondary | ICD-10-CM | POA: Diagnosis not present

## 2017-05-20 DIAGNOSIS — M1711 Unilateral primary osteoarthritis, right knee: Secondary | ICD-10-CM | POA: Diagnosis not present

## 2017-05-20 NOTE — Telephone Encounter (Signed)
Form received, will ask Dr. Gwenlyn Found to sign when in office.

## 2017-05-22 ENCOUNTER — Encounter (HOSPITAL_COMMUNITY): Payer: Self-pay

## 2017-05-22 DIAGNOSIS — Z471 Aftercare following joint replacement surgery: Secondary | ICD-10-CM | POA: Diagnosis not present

## 2017-05-22 DIAGNOSIS — M1711 Unilateral primary osteoarthritis, right knee: Secondary | ICD-10-CM | POA: Diagnosis not present

## 2017-05-22 DIAGNOSIS — M6281 Muscle weakness (generalized): Secondary | ICD-10-CM | POA: Diagnosis not present

## 2017-05-23 DIAGNOSIS — Z471 Aftercare following joint replacement surgery: Secondary | ICD-10-CM | POA: Diagnosis not present

## 2017-05-23 DIAGNOSIS — R262 Difficulty in walking, not elsewhere classified: Secondary | ICD-10-CM | POA: Diagnosis not present

## 2017-05-23 DIAGNOSIS — M1711 Unilateral primary osteoarthritis, right knee: Secondary | ICD-10-CM | POA: Diagnosis not present

## 2017-05-24 ENCOUNTER — Encounter (HOSPITAL_COMMUNITY): Payer: Self-pay

## 2017-05-24 NOTE — Telephone Encounter (Signed)
Form signed and faxed back to Cardiac Rehab.

## 2017-05-27 ENCOUNTER — Encounter (HOSPITAL_COMMUNITY): Payer: Self-pay

## 2017-05-27 DIAGNOSIS — R262 Difficulty in walking, not elsewhere classified: Secondary | ICD-10-CM | POA: Diagnosis not present

## 2017-05-27 DIAGNOSIS — Z471 Aftercare following joint replacement surgery: Secondary | ICD-10-CM | POA: Diagnosis not present

## 2017-05-27 DIAGNOSIS — M1711 Unilateral primary osteoarthritis, right knee: Secondary | ICD-10-CM | POA: Diagnosis not present

## 2017-05-29 ENCOUNTER — Encounter (HOSPITAL_COMMUNITY): Payer: Self-pay

## 2017-05-29 ENCOUNTER — Encounter (HOSPITAL_COMMUNITY)
Admission: RE | Admit: 2017-05-29 | Discharge: 2017-05-29 | Disposition: A | Discharge status: Home / Self Care | Payer: Self-pay | Source: Ambulatory Visit | Attending: Cardiovascular Disease | Admitting: Cardiovascular Disease

## 2017-05-29 DIAGNOSIS — M6281 Muscle weakness (generalized): Secondary | ICD-10-CM | POA: Diagnosis not present

## 2017-05-29 DIAGNOSIS — I2510 Atherosclerotic heart disease of native coronary artery without angina pectoris: Secondary | ICD-10-CM | POA: Insufficient documentation

## 2017-05-29 DIAGNOSIS — R262 Difficulty in walking, not elsewhere classified: Secondary | ICD-10-CM | POA: Diagnosis not present

## 2017-05-29 DIAGNOSIS — M1711 Unilateral primary osteoarthritis, right knee: Secondary | ICD-10-CM | POA: Diagnosis not present

## 2017-05-31 ENCOUNTER — Encounter (HOSPITAL_COMMUNITY): Payer: Self-pay

## 2017-05-31 DIAGNOSIS — R262 Difficulty in walking, not elsewhere classified: Secondary | ICD-10-CM | POA: Diagnosis not present

## 2017-05-31 DIAGNOSIS — M1711 Unilateral primary osteoarthritis, right knee: Secondary | ICD-10-CM | POA: Diagnosis not present

## 2017-05-31 DIAGNOSIS — Z471 Aftercare following joint replacement surgery: Secondary | ICD-10-CM | POA: Diagnosis not present

## 2017-06-03 ENCOUNTER — Encounter (HOSPITAL_COMMUNITY): Payer: Self-pay

## 2017-06-03 DIAGNOSIS — Z471 Aftercare following joint replacement surgery: Secondary | ICD-10-CM | POA: Diagnosis not present

## 2017-06-03 DIAGNOSIS — M1711 Unilateral primary osteoarthritis, right knee: Secondary | ICD-10-CM | POA: Diagnosis not present

## 2017-06-03 DIAGNOSIS — R262 Difficulty in walking, not elsewhere classified: Secondary | ICD-10-CM | POA: Diagnosis not present

## 2017-06-05 ENCOUNTER — Encounter (HOSPITAL_COMMUNITY): Payer: Self-pay

## 2017-06-05 DIAGNOSIS — M6281 Muscle weakness (generalized): Secondary | ICD-10-CM | POA: Diagnosis not present

## 2017-06-05 DIAGNOSIS — Z471 Aftercare following joint replacement surgery: Secondary | ICD-10-CM | POA: Diagnosis not present

## 2017-06-05 DIAGNOSIS — M1711 Unilateral primary osteoarthritis, right knee: Secondary | ICD-10-CM | POA: Diagnosis not present

## 2017-06-07 ENCOUNTER — Encounter (HOSPITAL_COMMUNITY): Payer: Self-pay

## 2017-06-10 ENCOUNTER — Encounter (HOSPITAL_COMMUNITY): Payer: Self-pay

## 2017-06-10 DIAGNOSIS — M1711 Unilateral primary osteoarthritis, right knee: Secondary | ICD-10-CM | POA: Diagnosis not present

## 2017-06-10 DIAGNOSIS — Z471 Aftercare following joint replacement surgery: Secondary | ICD-10-CM | POA: Diagnosis not present

## 2017-06-10 DIAGNOSIS — R262 Difficulty in walking, not elsewhere classified: Secondary | ICD-10-CM | POA: Diagnosis not present

## 2017-06-12 ENCOUNTER — Encounter (HOSPITAL_COMMUNITY): Payer: Self-pay

## 2017-06-12 DIAGNOSIS — M1711 Unilateral primary osteoarthritis, right knee: Secondary | ICD-10-CM | POA: Diagnosis not present

## 2017-06-12 DIAGNOSIS — Z471 Aftercare following joint replacement surgery: Secondary | ICD-10-CM | POA: Diagnosis not present

## 2017-06-12 DIAGNOSIS — R262 Difficulty in walking, not elsewhere classified: Secondary | ICD-10-CM | POA: Diagnosis not present

## 2017-06-14 ENCOUNTER — Encounter (HOSPITAL_COMMUNITY): Payer: Self-pay

## 2017-06-17 ENCOUNTER — Encounter (HOSPITAL_COMMUNITY)
Admission: RE | Admit: 2017-06-17 | Discharge: 2017-06-17 | Disposition: A | Discharge status: Home / Self Care | Payer: Self-pay | Source: Ambulatory Visit | Attending: Cardiovascular Disease | Admitting: Cardiovascular Disease

## 2017-06-17 ENCOUNTER — Encounter (HOSPITAL_COMMUNITY): Payer: Self-pay

## 2017-06-19 ENCOUNTER — Encounter (HOSPITAL_COMMUNITY): Payer: Self-pay

## 2017-06-19 ENCOUNTER — Encounter (HOSPITAL_COMMUNITY)
Admission: RE | Admit: 2017-06-19 | Discharge: 2017-06-19 | Disposition: A | Discharge status: Home / Self Care | Payer: Self-pay | Source: Ambulatory Visit | Attending: Cardiovascular Disease | Admitting: Cardiovascular Disease

## 2017-06-19 DIAGNOSIS — I2510 Atherosclerotic heart disease of native coronary artery without angina pectoris: Secondary | ICD-10-CM | POA: Insufficient documentation

## 2017-06-21 ENCOUNTER — Encounter (HOSPITAL_COMMUNITY)
Admission: RE | Admit: 2017-06-21 | Discharge: 2017-06-21 | Disposition: A | Discharge status: Home / Self Care | Payer: Self-pay | Source: Ambulatory Visit | Attending: Cardiovascular Disease | Admitting: Cardiovascular Disease

## 2017-06-21 ENCOUNTER — Encounter (HOSPITAL_COMMUNITY): Payer: Self-pay

## 2017-06-24 ENCOUNTER — Encounter (HOSPITAL_COMMUNITY): Payer: Self-pay

## 2017-06-24 ENCOUNTER — Encounter (HOSPITAL_COMMUNITY)
Admission: RE | Admit: 2017-06-24 | Discharge: 2017-06-24 | Disposition: A | Discharge status: Home / Self Care | Payer: Self-pay | Source: Ambulatory Visit | Attending: Cardiovascular Disease | Admitting: Cardiovascular Disease

## 2017-06-24 NOTE — Progress Notes (Signed)
FOLLOW UP  Assessment and Plan:   Hypertension Well controlled with current medications  Monitor blood pressure at home; patient to call if consistently greater than 130/80 Continue DASH diet.   Reminder to go to the ER if any CP, SOB, nausea, dizziness, severe HA, changes vision/speech, left arm numbness and tingling and jaw pain.  Cholesterol Currently at goal; continue statin therapy - has recently stopped taking fenofibrate, also quit drinking wine daily. Will restart medication only if indicated  Continue low cholesterol diet and exercise.  Check lipid panel.   Other abnormal glucose Recent A1Cs at goal Discussed diet/exercise, weight management  Defer A1C; check BMP  Obesity with co morbidities Long discussion about weight loss, diet, and exercise Recommended diet heavy in fruits and veggies and low in animal meats, cheeses, and dairy products, appropriate calorie intake Discussed ideal weight for height  Will follow up in 3 months  Vitamin D Def At goal at last visit; continue supplementation to maintain goal of 70-100 Defer Vit D level  Hypogonadism - continue replacement therapy, check testosterone levels as needed.   GERD Well managed on current medications Discussed diet, avoiding triggers and other lifestyle changes   Continue diet and meds as discussed. Further disposition pending results of labs. Discussed med's effects and SE's.   Over 30 minutes of exam, counseling, chart review, and critical decision making was performed.    ----------------------------------------------------------------------------------------------------------------------  HPI 62 y.o. male  presents for 3 month follow up on hypertension, cholesterol, glucose management, anxiety, weight and vitamin D deficiency. Patient had a negative heart cath in 2009 and then presented in 2012 with ACS undergoing PCA/DES. Patient continues out-patient cardiac rehab 3 x/week.  He is recently s/p R  TKA by Dr. Noemi Carter.   he has a diagnosis of anxiety and is currently on xanax 1mg  daily, reports symptoms are well controlled on current regimen. he reports he typically takes this medication at night to assist with sleep.   he has a diagnosis of GERD which is currently managed by protonix, ranitidine - he alternates between the two to avoid taking protonix daily.  he reports symptoms is currently well controlled, and denies breakthrough reflux, burning in chest, hoarseness or cough.    BMI is Body mass index is 30.99 kg/m., he has been working on diet and exercise.  Wt Readings from Last 3 Encounters:  06/25/17 200 lb 12.8 oz (91.1 kg)  04/03/17 199 lb 6.4 oz (90.4 kg)  04/04/17 200 lb 6.4 oz (90.9 kg)   His blood pressure has been controlled at home, today their BP is BP: 110/72  He does workout. He denies chest pain, shortness of breath, dizziness.   He is on cholesterol medication (atorvastatin 40 mg daily, recently quick fenofibrate) and denies myalgias. His cholesterol is at goal. The cholesterol last visit was:   Lab Results  Component Value Date   CHOL 174 03/21/2017   HDL 61 03/21/2017   LDLCALC 93 03/21/2017   TRIG 103 03/21/2017   CHOLHDL 2.9 03/21/2017    He has been working on diet and exercise for glucose management, and denies foot ulcerations, increased appetite, nausea, paresthesia of the feet, polydipsia, polyuria, visual disturbances, vomiting and weight loss. Last A1C in the office was:  Lab Results  Component Value Date   HGBA1C 5.1 03/21/2017   Patient is on Vitamin D supplement.   Lab Results  Component Value Date   VD25OH 31 03/21/2017     He has a history of testosterone deficiency  and is on testosterone replacement. He states that the testosterone helps with his energy, libido, muscle mass. Lab Results  Component Value Date   TESTOSTERONE 252 03/21/2017      Current Medications:  Current Outpatient Medications on File Prior to Visit  Medication  Sig  . acetaminophen (TYLENOL) 325 MG tablet Take 2 tablets (650 mg total) by mouth every 4 (four) hours as needed for mild pain ((score 1 to 3) or temp > 100.5).  Marland Kitchen ALPRAZolam (XANAX) 1 MG tablet Take 1/2 to 1 tablet 2 to 3 x / day Only if needed for Anxiety Attack and please try to limit to 5 days /week to avoid addiction (Patient taking differently: Take 1 mg by mouth at bedtime. Take 1/2 to 1 tablet 2 to 3 x / day Only if needed for Anxiety Attack and please try to limit to 5 days /week to avoid addiction)  . Ascorbic Acid (VITAMIN C PO) Take 1 tablet by mouth daily as needed (for immune system support).  . ASPIRIN 81 PO Take by mouth.  Marland Kitchen aspirin EC 325 MG EC tablet 1 tab a day for the next 30 days to prevent blood clots ok to take with Plavix  . atenolol (TENORMIN) 100 MG tablet Take 1 tablet (100 mg total) by mouth daily.  Marland Kitchen atenolol (TENORMIN) 50 MG tablet TAKE 2 TABLETS BY MOUTH EVERY DAY  . atorvastatin (LIPITOR) 40 MG tablet TAKE 1 TABLET BY MOUTH EVERY DAY FOR CHOLESTEROL  . buPROPion (WELLBUTRIN XL) 300 MG 24 hr tablet TAKE 1 TABLET BY MOUTH  DAILY FOR MOOD (Patient taking differently: Take 300 mg by mouth daily. TAKE 1 TABLET BY MOUTH  DAILY FOR MOOD)  . clopidogrel (PLAVIX) 75 MG tablet TAKE 1 TABLET BY MOUTH EVERY DAY  . diclofenac sodium (VOLTAREN) 1 % GEL Apply 2-4 g topically 4 (four) times daily as needed (for knee pain.).  Marland Kitchen diltiazem (CARDIZEM CD) 240 MG 24 hr capsule TAKE 1 CAPSULE BY MOUTH  DAILY FOR BLOOD PRESSURE (Patient taking differently: Take 240 mg by mouth daily. TAKE 1 CAPSULE BY MOUTH  DAILY FOR BLOOD PRESSURE)  . doxazosin (CARDURA) 8 MG tablet TAKE 1/2 TO 1 TABLET BY MOUTH DAILY FOR BLOOD PRESSURE AND PROSTATE  . furosemide (LASIX) 80 MG tablet TAKE 1 TABLET BY MOUTH EVERY DAY FOR BLOOD PRESSURE AND FLUID (Patient taking differently: Take 80 mg by mouth daily. TAKE 1 TABLET BY MOUTH EVERY DAY FOR BLOOD PRESSURE AND FLUID)  . Multiple Vitamin (MULTIVITAMIN WITH  MINERALS) TABS Take 2 tablets by mouth daily. Mega Men Multivitamin.  . nitroGLYCERIN (NITROSTAT) 0.4 MG SL tablet PLACE 1 TABLET (0.4 MG TOTAL) UNDER THE TONGUE EVERY 5 (FIVE) MINUTES AS NEEDED FOR CHEST PAIN.  Marland Kitchen olmesartan (BENICAR) 40 MG tablet DO NOT TAKE THIS MEDICATION UNLESS BP IS GREATER THAN 140/90 (Patient taking differently: 20 mg. DO NOT TAKE THIS MEDICATION UNLESS BP IS GREATER THAN 140/90)  . ranitidine (ZANTAC) 75 MG tablet Take 150 mg by mouth daily as needed for heartburn.  . testosterone cypionate (DEPOTESTOSTERONE CYPIONATE) 200 MG/ML injection INJECT 2 ML INTRAMUSCULARLY EVERY 12 DAYS  . docusate sodium (COLACE) 100 MG capsule .1 tab 2 times a day while on narcotics.  STOOL SOFTENER (Patient not taking: Reported on 06/25/2017)  . fenofibrate 160 MG tablet Take 1 tablet (160 mg total) by mouth daily. (Patient not taking: Reported on 06/25/2017)  . HYDROmorphone (DILAUDID) 2 MG tablet 1 po q 4 hrs prn pain.  Patient had  a total knee replacement on 04/15/2017 (Patient not taking: Reported on 06/25/2017)  . pantoprazole (PROTONIX) 40 MG tablet Take 40 mg by mouth daily as needed.  . polyethylene glycol (MIRALAX / GLYCOLAX) packet 17grams in 6 oz of drink twice a day until bowel movement.  LAXITIVE.  Restart if two days since last bowel movement (Patient not taking: Reported on 06/25/2017)   No current facility-administered medications on file prior to visit.      Allergies:  Allergies  Allergen Reactions  . Hctz [Hydrochlorothiazide]   . Tape Rash    Clear tape     Medical History:  Past Medical History:  Diagnosis Date  . Anxiety   . Arthritis   . Atrial flutter, paroxysmal ([place]) 01/04/2011  . CAD (coronary artery disease), native coronary artery 01/04/2011  . Diverticulitis   . Diverticulosis   . GERD (gastroesophageal reflux disease)   . Hemarthrosis of knee, right 01/04/2011  . Hemorrhoids   . History of meniscectomy of right knee 01/02/2011  . Hyperlipemia 01/04/2011   . Hyperlipidemia   . Hypertension   . Meniscus tear 01/02/2011  . NSVT (nonsustained ventricular tachycardia) ([place]) 01/02/2011  . Primary osteoarthritis of right knee 04/03/2017  . Right knee pain 01/02/2011  . Stented coronary artery 01/04/2011   LAD DES [place].  . Torn medial meniscus    right knee   Family history- Reviewed and unchanged Social history- Reviewed and unchanged   Review of Systems:  Review of Systems  Constitutional: Negative for malaise/fatigue and weight loss.  HENT: Negative for hearing loss and tinnitus.   Eyes: Negative for blurred vision and double vision.  Respiratory: Negative for cough, shortness of breath and wheezing.   Cardiovascular: Negative for chest pain, palpitations, orthopnea, claudication and leg swelling.  Gastrointestinal: Negative for abdominal pain, blood in stool, constipation, diarrhea, heartburn, melena, nausea and vomiting.  Genitourinary: Negative.   Musculoskeletal: Negative for joint pain and myalgias.  Skin: Negative for rash.  Neurological: Negative for dizziness, tingling, sensory change, weakness and headaches.  Endo/Heme/Allergies: Negative for polydipsia.  Psychiatric/Behavioral: Negative.   All other systems reviewed and are negative.     Physical Exam: BP 110/72   Pulse (!) 58   Temp 97.7 F (36.5 C)   Ht 5' 7.5" (1.715 m)   Wt 200 lb 12.8 oz (91.1 kg)   SpO2 96%   BMI 30.99 kg/m  Wt Readings from Last 3 Encounters:  06/25/17 200 lb 12.8 oz (91.1 kg)  04/03/17 199 lb 6.4 oz (90.4 kg)  04/04/17 200 lb 6.4 oz (90.9 kg)   General Appearance: Well nourished, in no apparent distress. Eyes: PERRLA, EOMs, conjunctiva no swelling or erythema Sinuses: No Frontal/maxillary tenderness ENT/Mouth: Ext aud canals clear, TMs without erythema, bulging. No erythema, swelling, or exudate on post pharynx.  Tonsils not swollen or erythematous. Hearing normal.  Neck: Supple, thyroid normal.  Respiratory: Respiratory effort  normal, BS equal bilaterally without rales, rhonchi, wheezing or stridor.  Cardio: RRR with no MRGs. Brisk peripheral pulses without edema.  Abdomen: Soft, + BS.  Non tender, no guarding, rebound, hernias, masses. Lymphatics: Non tender without lymphadenopathy.  Musculoskeletal: Full ROM, 5/5 strength, Normal gait, left knee with body  Skin: Warm, dry without rashes, lesions, ecchymosis.  Neuro: Cranial nerves intact. No cerebellar symptoms.  Psych: Awake and oriented X 3, normal affect, Insight and Judgment appropriate.    Izora Ribas, NP 9:46 AM [facility]

## 2017-06-25 ENCOUNTER — Ambulatory Visit: Payer: 59 | Admitting: Adult Health

## 2017-06-25 ENCOUNTER — Encounter: Payer: Self-pay | Admitting: Adult Health

## 2017-06-25 VITALS — BP 110/72 | HR 58 | Temp 97.7°F | Ht 67.5 in | Wt 200.8 lb

## 2017-06-25 DIAGNOSIS — I10 Essential (primary) hypertension: Secondary | ICD-10-CM

## 2017-06-25 DIAGNOSIS — Z79899 Other long term (current) drug therapy: Secondary | ICD-10-CM | POA: Diagnosis not present

## 2017-06-25 DIAGNOSIS — E559 Vitamin D deficiency, unspecified: Secondary | ICD-10-CM | POA: Diagnosis not present

## 2017-06-25 DIAGNOSIS — Z6830 Body mass index (BMI) 30.0-30.9, adult: Secondary | ICD-10-CM

## 2017-06-25 DIAGNOSIS — F419 Anxiety disorder, unspecified: Secondary | ICD-10-CM | POA: Diagnosis not present

## 2017-06-25 DIAGNOSIS — E349 Endocrine disorder, unspecified: Secondary | ICD-10-CM

## 2017-06-25 DIAGNOSIS — R7309 Other abnormal glucose: Secondary | ICD-10-CM

## 2017-06-25 DIAGNOSIS — E782 Mixed hyperlipidemia: Secondary | ICD-10-CM

## 2017-06-25 DIAGNOSIS — K219 Gastro-esophageal reflux disease without esophagitis: Secondary | ICD-10-CM

## 2017-06-25 NOTE — Patient Instructions (Signed)
Here is some information to help you keep your heart healthy: Move it! - Aim for 30 mins of activity every day. Take it slowly at first. Talk to [place] before starting any new exercise program.   Lose it.  -Body Mass Index (BMI) can indicate if you need to lose weight. A healthy range is 18.5-24.9. For a BMI calculator, go to [employer]  Waist Management -Excess abdominal fat is a risk factor for heart disease, diabetes, asthma, stroke and more. Ideal waist circumference is less than 35" for women and less than 40" for men.   Eat Right -focus on fruits, vegetables, whole grains, and meals you make yourself. Avoid foods with trans fat and high sugar/sodium content.   Snooze or Snore? - Loud snoring can be a sign of sleep apnea, a significant risk factor for high blood pressure, heart attach, stroke, and heart arrhythmias.  Kick the habit -Quit Smoking! Avoid second hand smoke. A single cigarette raises your blood pressure for 20 mins and increases the risk of heart attack and stroke for the next 24 hours.   Are Aspirin and Supplements right for you? -Add ENTERIC COATED low dose 81 mg Aspirin daily OR can do every other day if you have easy bruising to protect your heart and head. As well as to reduce risk of Colon Cancer by 20 %, Skin Cancer by 26 % , Melanoma by 46% and Pancreatic cancer by 60%  Say "No to Stress -There may be little you can do about problems that cause stress. However, techniques such as long walks, meditation, and exercise can help you manage it.   Start Now! - Make changes one at a time and set reasonable goals to increase your likelihood of success.      High Cholesterol High cholesterol is a condition in which the blood has high levels of a white, waxy, fat-like substance (cholesterol). The human body needs small amounts of cholesterol. The liver makes all the cholesterol that the body needs. Extra (excess) cholesterol comes from the food that we eat. Cholesterol  is carried from the liver by the blood through the blood vessels. If you have high cholesterol, deposits (plaques) may build up on the walls of your blood vessels (arteries). Plaques make the arteries narrower and stiffer. Cholesterol plaques increase your risk for heart attack and stroke. Work with your health care provider to keep your cholesterol levels in a healthy range. What increases the risk? This condition is more likely to develop in people who:  Eat foods that are high in animal fat (saturated fat) or cholesterol.  Are overweight.  Are not getting enough exercise.  Have a family history of high cholesterol.  What are the signs or symptoms? There are no symptoms of this condition. How is this diagnosed? This condition may be diagnosed from the results of a blood test.  If you are older than age 63, your health care provider may check your cholesterol every 4-6 years.  You may be checked more often if you already have high cholesterol or other risk factors for heart disease.  The blood test for cholesterol measures:  "Bad" cholesterol (LDL cholesterol). This is the main type of cholesterol that causes heart disease. The desired level for LDL is less than 100.  "Good" cholesterol (HDL cholesterol). This type helps to protect against heart disease by cleaning the arteries and carrying the LDL away. The desired level for HDL is 60 or higher.  Triglycerides. These are fats that the body  can store or burn for energy. The desired number for triglycerides is lower than 150.  Total cholesterol. This is a measure of the total amount of cholesterol in your blood, including LDL cholesterol, HDL cholesterol, and triglycerides. A healthy number is less than 200.  How is this treated? This condition is treated with diet changes, lifestyle changes, and medicines. Diet changes  This may include eating more whole grains, fruits, vegetables, nuts, and fish.  This may also include cutting  back on red meat and foods that have a lot of added sugar. Lifestyle changes  Changes may include getting at least 40 minutes of aerobic exercise 3 times a week. Aerobic exercises include walking, biking, and swimming. Aerobic exercise along with a healthy diet can help you maintain a healthy weight.  Changes may also include quitting smoking. Medicines  Medicines are usually given if diet and lifestyle changes have failed to reduce your cholesterol to healthy levels.  Your health care provider may prescribe a statin medicine. Statin medicines have been shown to reduce cholesterol, which can reduce the risk of heart disease. Follow these instructions at home: Eating and drinking  If told by your health care provider:  Eat chicken (without skin), fish, veal, shellfish, ground [place] breast, and round or loin cuts of red meat.  Do not eat fried foods or fatty meats, such as hot dogs and salami.  Eat plenty of fruits, such as apples.  Eat plenty of vegetables, such as broccoli, potatoes, and carrots.  Eat beans, peas, and lentils.  Eat grains such as barley, rice, couscous, and bulgur wheat.  Eat pasta without cream sauces.  Use skim or nonfat milk, and eat low-fat or nonfat yogurt and cheeses.  Do not eat or drink whole milk, cream, ice cream, egg yolks, or hard cheeses.  Do not eat stick margarine or tub margarines that contain trans fats (also called partially hydrogenated oils).  Do not eat saturated tropical oils, such as coconut oil and palm oil.  Do not eat cakes, cookies, crackers, or other baked goods that contain trans fats.  General instructions  Exercise as directed by your health care provider. Increase your activity level with activities such as gardening, walking, and taking the stairs.  Take over-the-counter and prescription medicines only as told by your health care provider.  Do not use any products that contain nicotine or tobacco, such as cigarettes and  e-cigarettes. If you need help quitting, ask your health care provider.  Keep all follow-up visits as told by your health care provider. This is important. Contact a health care provider if:  You are struggling to maintain a healthy diet or weight.  You need help to start on an exercise program.  You need help to stop smoking. Get help right away if:  You have chest pain.  You have trouble breathing. This information is not intended to replace advice given to you by your health care provider. Make sure you discuss any questions you have with your health care provider. Document Released: 02/05/2005 Document Revised: 09/03/2015 Document Reviewed: 08/06/2015 Elsevier Interactive Patient Education  [employer].

## 2017-06-26 ENCOUNTER — Encounter (HOSPITAL_COMMUNITY)
Admission: RE | Admit: 2017-06-26 | Discharge: 2017-06-26 | Disposition: A | Discharge status: Home / Self Care | Payer: Self-pay | Source: Ambulatory Visit | Attending: Cardiovascular Disease | Admitting: Cardiovascular Disease

## 2017-06-26 ENCOUNTER — Encounter (HOSPITAL_COMMUNITY): Payer: Self-pay

## 2017-06-26 LAB — COMPLETE METABOLIC PANEL WITH GFR
AG RATIO: 1.5 (calc) (ref 1.0–2.5)
ALT: 29 U/L (ref 9–46)
AST: 40 U/L — AB (ref 10–35)
Albumin: 4.3 g/dL (ref 3.6–5.1)
Alkaline phosphatase (APISO): 42 U/L (ref 40–115)
BUN: 21 mg/dL (ref 7–25)
CALCIUM: 9.9 mg/dL (ref 8.6–10.3)
CO2: 28 mmol/L (ref 20–32)
CREATININE: 1.15 mg/dL (ref 0.70–1.25)
Chloride: 101 mmol/L (ref 98–110)
GFR, EST NON AFRICAN AMERICAN: 68 mL/min/{1.73_m2} (ref >=60)
GFR, Est African American: 79 mL/min/{1.73_m2} (ref >=60)
Globulin: 2.8 g/dL (calc) (ref 1.9–3.7)
Glucose, Bld: 94 mg/dL (ref 65–99)
Potassium: 4.1 mmol/L (ref 3.5–5.3)
Sodium: 139 mmol/L (ref 135–146)
Total Bilirubin: 0.5 mg/dL (ref 0.2–1.2)
Total Protein: 7.1 g/dL (ref 6.1–8.1)

## 2017-06-26 LAB — CBC WITH DIFFERENTIAL/PLATELET
BASOS ABS: 21 {cells}/uL (ref 0–200)
Basophils Relative: 0.4 %
Eosinophils Absolute: 159 cells/uL (ref 15–500)
Eosinophils Relative: 3 %
HEMATOCRIT: 42.7 % (ref 38.5–50.0)
HEMOGLOBIN: 14.4 g/dL (ref 13.2–17.1)
LYMPHS ABS: 1717 {cells}/uL (ref 850–3900)
MCH: 31.9 pg (ref 27.0–33.0)
MCHC: 33.7 g/dL (ref 32.0–36.0)
MCV: 94.7 fL (ref 80.0–100.0)
MPV: 10.1 fL (ref 7.5–12.5)
Monocytes Relative: 12.2 %
NEUTROS ABS: 2756 {cells}/uL (ref 1500–7800)
NEUTROS PCT: 52 %
Platelets: 219 10*3/uL (ref 140–400)
RBC: 4.51 10*6/uL (ref 4.20–5.80)
RDW: 12.3 % (ref 11.0–15.0)
Total Lymphocyte: 32.4 %
WBC: 5.3 10*3/uL (ref 3.8–10.8)
WBCMIX: 647 {cells}/uL (ref 200–950)

## 2017-06-26 LAB — LIPID PANEL
CHOL/HDL RATIO: 2.3 (calc) (ref <5.0)
Cholesterol: 131 mg/dL (ref <200)
HDL: 56 mg/dL (ref >40)
LDL Cholesterol (Calc): 62 mg/dL (calc)
Non-HDL Cholesterol (Calc): 75 mg/dL (calc) (ref <130)
TRIGLYCERIDES: 57 mg/dL (ref <150)

## 2017-06-26 LAB — MAGNESIUM: MAGNESIUM: 1.6 mg/dL (ref 1.5–2.5)

## 2017-06-26 LAB — TSH: TSH: 0.89 mIU/L (ref 0.40–4.50)

## 2017-06-28 ENCOUNTER — Encounter (HOSPITAL_COMMUNITY): Payer: Self-pay

## 2017-07-01 ENCOUNTER — Encounter (HOSPITAL_COMMUNITY): Payer: Self-pay

## 2017-07-01 ENCOUNTER — Encounter (HOSPITAL_COMMUNITY)
Admission: RE | Admit: 2017-07-01 | Discharge: 2017-07-01 | Disposition: A | Discharge status: Home / Self Care | Payer: 59 | Source: Ambulatory Visit | Attending: Cardiovascular Disease | Admitting: Cardiovascular Disease

## 2017-07-03 ENCOUNTER — Encounter (HOSPITAL_COMMUNITY): Payer: Self-pay

## 2017-07-03 ENCOUNTER — Encounter (HOSPITAL_COMMUNITY)
Admission: RE | Admit: 2017-07-03 | Discharge: 2017-07-03 | Disposition: A | Discharge status: Home / Self Care | Payer: Self-pay | Source: Ambulatory Visit | Attending: Cardiovascular Disease | Admitting: Cardiovascular Disease

## 2017-07-05 ENCOUNTER — Encounter (HOSPITAL_COMMUNITY): Payer: Self-pay

## 2017-07-08 ENCOUNTER — Encounter (HOSPITAL_COMMUNITY): Payer: Self-pay

## 2017-07-10 ENCOUNTER — Encounter (HOSPITAL_COMMUNITY): Payer: Self-pay

## 2017-07-10 ENCOUNTER — Encounter (HOSPITAL_COMMUNITY)
Admission: RE | Admit: 2017-07-10 | Discharge: 2017-07-10 | Disposition: A | Discharge status: Home / Self Care | Payer: Self-pay | Source: Ambulatory Visit | Attending: Cardiovascular Disease | Admitting: Cardiovascular Disease

## 2017-07-12 ENCOUNTER — Encounter (HOSPITAL_COMMUNITY): Payer: Self-pay

## 2017-07-17 ENCOUNTER — Encounter (HOSPITAL_COMMUNITY)
Admission: RE | Admit: 2017-07-17 | Discharge: 2017-07-17 | Disposition: A | Discharge status: Home / Self Care | Payer: 59 | Source: Ambulatory Visit | Attending: Cardiovascular Disease | Admitting: Cardiovascular Disease

## 2017-07-17 ENCOUNTER — Encounter (HOSPITAL_COMMUNITY): Payer: Self-pay

## 2017-07-19 ENCOUNTER — Encounter (HOSPITAL_COMMUNITY): Payer: Self-pay

## 2017-07-19 ENCOUNTER — Other Ambulatory Visit: Payer: Self-pay | Admitting: Physician Assistant

## 2017-07-22 ENCOUNTER — Encounter (HOSPITAL_COMMUNITY)
Admission: RE | Admit: 2017-07-22 | Discharge: 2017-07-22 | Disposition: A | Discharge status: Home / Self Care | Payer: Self-pay | Source: Ambulatory Visit | Attending: Cardiovascular Disease | Admitting: Cardiovascular Disease

## 2017-07-22 ENCOUNTER — Encounter (HOSPITAL_COMMUNITY): Payer: Self-pay

## 2017-07-22 DIAGNOSIS — I2510 Atherosclerotic heart disease of native coronary artery without angina pectoris: Secondary | ICD-10-CM | POA: Insufficient documentation

## 2017-07-24 ENCOUNTER — Encounter (HOSPITAL_COMMUNITY)
Admission: RE | Admit: 2017-07-24 | Discharge: 2017-07-24 | Disposition: A | Discharge status: Home / Self Care | Payer: Self-pay | Source: Ambulatory Visit | Attending: Cardiovascular Disease | Admitting: Cardiovascular Disease

## 2017-07-24 ENCOUNTER — Encounter (HOSPITAL_COMMUNITY): Payer: Self-pay

## 2017-07-26 ENCOUNTER — Encounter (HOSPITAL_COMMUNITY): Payer: Self-pay

## 2017-07-29 ENCOUNTER — Encounter (HOSPITAL_COMMUNITY): Payer: Self-pay

## 2017-07-29 ENCOUNTER — Encounter (HOSPITAL_COMMUNITY)
Admission: RE | Admit: 2017-07-29 | Discharge: 2017-07-29 | Disposition: A | Discharge status: Home / Self Care | Payer: Self-pay | Source: Ambulatory Visit | Attending: Cardiovascular Disease | Admitting: Cardiovascular Disease

## 2017-07-31 ENCOUNTER — Encounter (HOSPITAL_COMMUNITY): Payer: Self-pay

## 2017-07-31 ENCOUNTER — Encounter (HOSPITAL_COMMUNITY)
Admission: RE | Admit: 2017-07-31 | Discharge: 2017-07-31 | Disposition: A | Discharge status: Home / Self Care | Payer: Self-pay | Source: Ambulatory Visit | Attending: Cardiovascular Disease | Admitting: Cardiovascular Disease

## 2017-08-01 ENCOUNTER — Other Ambulatory Visit: Payer: Self-pay | Admitting: Internal Medicine

## 2017-08-02 ENCOUNTER — Encounter (HOSPITAL_COMMUNITY): Payer: Self-pay

## 2017-08-05 ENCOUNTER — Encounter (HOSPITAL_COMMUNITY)
Admission: RE | Admit: 2017-08-05 | Discharge: 2017-08-05 | Disposition: A | Discharge status: Home / Self Care | Payer: Self-pay | Source: Ambulatory Visit | Attending: Cardiovascular Disease | Admitting: Cardiovascular Disease

## 2017-08-05 ENCOUNTER — Encounter (HOSPITAL_COMMUNITY): Payer: Self-pay

## 2017-08-07 ENCOUNTER — Encounter (HOSPITAL_COMMUNITY): Payer: Self-pay

## 2017-08-07 ENCOUNTER — Encounter (HOSPITAL_COMMUNITY)
Admission: RE | Admit: 2017-08-07 | Discharge: 2017-08-07 | Disposition: A | Discharge status: Home / Self Care | Payer: Self-pay | Source: Ambulatory Visit | Attending: Cardiovascular Disease | Admitting: Cardiovascular Disease

## 2017-08-09 ENCOUNTER — Encounter (HOSPITAL_COMMUNITY): Payer: Self-pay

## 2017-08-09 ENCOUNTER — Other Ambulatory Visit: Payer: Self-pay | Admitting: Internal Medicine

## 2017-08-09 ENCOUNTER — Other Ambulatory Visit: Payer: Self-pay | Admitting: Adult Health

## 2017-08-09 DIAGNOSIS — F419 Anxiety disorder, unspecified: Secondary | ICD-10-CM

## 2017-08-09 MED ORDER — ALPRAZOLAM 1 MG PO TABS: ORAL_TABLET | ORAL | 0 refills | Status: DC

## 2017-08-12 ENCOUNTER — Encounter (HOSPITAL_COMMUNITY)
Admission: RE | Admit: 2017-08-12 | Discharge: 2017-08-12 | Disposition: A | Discharge status: Home / Self Care | Payer: 59 | Source: Ambulatory Visit | Attending: Cardiovascular Disease | Admitting: Cardiovascular Disease

## 2017-08-12 ENCOUNTER — Encounter (HOSPITAL_COMMUNITY): Payer: Self-pay

## 2017-08-14 ENCOUNTER — Encounter (HOSPITAL_COMMUNITY)
Admission: RE | Admit: 2017-08-14 | Discharge: 2017-08-14 | Disposition: A | Discharge status: Home / Self Care | Payer: 59 | Source: Ambulatory Visit | Attending: Cardiovascular Disease | Admitting: Cardiovascular Disease

## 2017-08-14 ENCOUNTER — Encounter (HOSPITAL_COMMUNITY): Payer: Self-pay

## 2017-08-16 ENCOUNTER — Encounter (HOSPITAL_COMMUNITY): Payer: Self-pay

## 2017-08-19 ENCOUNTER — Encounter (HOSPITAL_COMMUNITY): Payer: Self-pay

## 2017-08-19 ENCOUNTER — Encounter (HOSPITAL_COMMUNITY)
Admission: RE | Admit: 2017-08-19 | Discharge: 2017-08-19 | Disposition: A | Discharge status: Home / Self Care | Payer: Self-pay | Source: Ambulatory Visit | Attending: Cardiovascular Disease | Admitting: Cardiovascular Disease

## 2017-08-19 DIAGNOSIS — I2510 Atherosclerotic heart disease of native coronary artery without angina pectoris: Secondary | ICD-10-CM | POA: Insufficient documentation

## 2017-08-21 ENCOUNTER — Encounter (HOSPITAL_COMMUNITY): Payer: Self-pay

## 2017-08-23 ENCOUNTER — Encounter (HOSPITAL_COMMUNITY): Payer: Self-pay

## 2017-08-26 ENCOUNTER — Encounter (HOSPITAL_COMMUNITY)
Admission: RE | Admit: 2017-08-26 | Discharge: 2017-08-26 | Disposition: A | Discharge status: Home / Self Care | Payer: Self-pay | Source: Ambulatory Visit | Attending: Cardiovascular Disease | Admitting: Cardiovascular Disease

## 2017-08-26 ENCOUNTER — Encounter (HOSPITAL_COMMUNITY): Payer: Self-pay

## 2017-08-28 ENCOUNTER — Encounter (HOSPITAL_COMMUNITY): Payer: Self-pay

## 2017-08-30 ENCOUNTER — Encounter (HOSPITAL_COMMUNITY): Payer: Self-pay

## 2017-09-02 ENCOUNTER — Encounter (HOSPITAL_COMMUNITY): Payer: Self-pay

## 2017-09-04 ENCOUNTER — Encounter (HOSPITAL_COMMUNITY): Payer: Self-pay

## 2017-09-06 ENCOUNTER — Encounter (HOSPITAL_COMMUNITY): Payer: Self-pay

## 2017-09-06 ENCOUNTER — Encounter (HOSPITAL_COMMUNITY)
Admission: RE | Admit: 2017-09-06 | Discharge: 2017-09-06 | Disposition: A | Discharge status: Home / Self Care | Payer: Self-pay | Source: Ambulatory Visit | Attending: Cardiovascular Disease | Admitting: Cardiovascular Disease

## 2017-09-09 ENCOUNTER — Encounter (HOSPITAL_COMMUNITY): Payer: Self-pay

## 2017-09-09 ENCOUNTER — Encounter (HOSPITAL_COMMUNITY)
Admission: RE | Admit: 2017-09-09 | Discharge: 2017-09-09 | Disposition: A | Discharge status: Home / Self Care | Payer: 59 | Source: Ambulatory Visit | Attending: Cardiovascular Disease | Admitting: Cardiovascular Disease

## 2017-09-11 ENCOUNTER — Encounter (HOSPITAL_COMMUNITY): Payer: Self-pay

## 2017-09-13 ENCOUNTER — Encounter (HOSPITAL_COMMUNITY): Payer: Self-pay

## 2017-09-16 ENCOUNTER — Encounter (HOSPITAL_COMMUNITY)
Admission: RE | Admit: 2017-09-16 | Discharge: 2017-09-16 | Disposition: A | Discharge status: Home / Self Care | Payer: Self-pay | Source: Ambulatory Visit | Attending: Cardiovascular Disease | Admitting: Cardiovascular Disease

## 2017-09-16 ENCOUNTER — Encounter (HOSPITAL_COMMUNITY): Payer: Self-pay

## 2017-09-18 ENCOUNTER — Encounter (HOSPITAL_COMMUNITY): Payer: Self-pay

## 2017-09-18 ENCOUNTER — Encounter (HOSPITAL_COMMUNITY)
Admission: RE | Admit: 2017-09-18 | Discharge: 2017-09-18 | Disposition: A | Discharge status: Home / Self Care | Payer: Self-pay | Source: Ambulatory Visit | Attending: Cardiovascular Disease | Admitting: Cardiovascular Disease

## 2017-09-20 ENCOUNTER — Encounter (HOSPITAL_COMMUNITY): Payer: Self-pay

## 2017-09-20 DIAGNOSIS — I2510 Atherosclerotic heart disease of native coronary artery without angina pectoris: Secondary | ICD-10-CM | POA: Insufficient documentation

## 2017-09-23 ENCOUNTER — Encounter (HOSPITAL_COMMUNITY)
Admission: RE | Admit: 2017-09-23 | Discharge: 2017-09-23 | Disposition: A | Discharge status: Home / Self Care | Payer: 59 | Source: Ambulatory Visit | Attending: Cardiovascular Disease | Admitting: Cardiovascular Disease

## 2017-09-25 ENCOUNTER — Encounter (HOSPITAL_COMMUNITY): Payer: Self-pay

## 2017-09-27 ENCOUNTER — Encounter (HOSPITAL_COMMUNITY): Payer: Self-pay

## 2017-09-30 ENCOUNTER — Encounter (HOSPITAL_COMMUNITY): Payer: Self-pay

## 2017-10-01 ENCOUNTER — Ambulatory Visit: Payer: Self-pay | Admitting: Internal Medicine

## 2017-10-02 ENCOUNTER — Encounter (HOSPITAL_COMMUNITY)
Admission: RE | Admit: 2017-10-02 | Discharge: 2017-10-02 | Disposition: A | Discharge status: Home / Self Care | Payer: Self-pay | Source: Ambulatory Visit | Attending: Cardiovascular Disease | Admitting: Cardiovascular Disease

## 2017-10-04 ENCOUNTER — Encounter (HOSPITAL_COMMUNITY)
Admission: RE | Admit: 2017-10-04 | Discharge: 2017-10-04 | Disposition: A | Discharge status: Home / Self Care | Payer: Self-pay | Source: Ambulatory Visit | Attending: Cardiovascular Disease | Admitting: Cardiovascular Disease

## 2017-10-07 ENCOUNTER — Encounter (HOSPITAL_COMMUNITY): Payer: Self-pay

## 2017-10-07 DIAGNOSIS — M1711 Unilateral primary osteoarthritis, right knee: Secondary | ICD-10-CM | POA: Diagnosis not present

## 2017-10-09 ENCOUNTER — Encounter (HOSPITAL_COMMUNITY)
Admission: RE | Admit: 2017-10-09 | Discharge: 2017-10-09 | Disposition: A | Discharge status: Home / Self Care | Payer: Self-pay | Source: Ambulatory Visit | Attending: Cardiovascular Disease | Admitting: Cardiovascular Disease

## 2017-10-10 ENCOUNTER — Ambulatory Visit: Payer: 59 | Admitting: Internal Medicine

## 2017-10-10 VITALS — BP 110/64 | HR 60 | Temp 97.9°F | Resp 18 | Ht 67.5 in | Wt 200.4 lb

## 2017-10-10 DIAGNOSIS — N183 Chronic kidney disease, stage 3 unspecified: Secondary | ICD-10-CM

## 2017-10-10 DIAGNOSIS — I10 Essential (primary) hypertension: Secondary | ICD-10-CM | POA: Diagnosis not present

## 2017-10-10 DIAGNOSIS — K219 Gastro-esophageal reflux disease without esophagitis: Secondary | ICD-10-CM

## 2017-10-10 DIAGNOSIS — R7303 Prediabetes: Secondary | ICD-10-CM

## 2017-10-10 DIAGNOSIS — E782 Mixed hyperlipidemia: Secondary | ICD-10-CM | POA: Diagnosis not present

## 2017-10-10 DIAGNOSIS — N184 Chronic kidney disease, stage 4 (severe): Secondary | ICD-10-CM | POA: Diagnosis not present

## 2017-10-10 DIAGNOSIS — I130 Hypertensive heart and chronic kidney disease with heart failure and stage 1 through stage 4 chronic kidney disease, or unspecified chronic kidney disease: Secondary | ICD-10-CM

## 2017-10-10 DIAGNOSIS — Z79899 Other long term (current) drug therapy: Secondary | ICD-10-CM

## 2017-10-10 DIAGNOSIS — E559 Vitamin D deficiency, unspecified: Secondary | ICD-10-CM

## 2017-10-10 DIAGNOSIS — I2510 Atherosclerotic heart disease of native coronary artery without angina pectoris: Secondary | ICD-10-CM

## 2017-10-10 DIAGNOSIS — E349 Endocrine disorder, unspecified: Secondary | ICD-10-CM

## 2017-10-10 NOTE — Progress Notes (Signed)
This very nice 62 y.o. MWM presents for 3 month follow up with HTN, HLD, Pre-Diabetes and Vitamin D Deficiency.      Patient is treated for HTN (1989)  & BP has been controlled at home. Today's BP is at goal - 110/64.  In 2009, he had a Normal Heart Cath, but presented with ACS in 2012 and had PCA/Stenting. He also has HT CKD3 (GFR 50). Patient has had no complaints of any cardiac type chest pain, palpitations, dyspnea / orthopnea / PND, dizziness, claudication, or dependent edema.     Hyperlipidemia is controlled with diet & meds. Patient denies myalgias or other med SE's. Last Lipids were at goal: Lab Results  Component Value Date   CHOL 131 06/25/2017   HDL 56 06/25/2017   LDLCALC 62 06/25/2017   TRIG 57 06/25/2017   CHOLHDL 2.3 06/25/2017      Also, the patient has history of  PreDiabetes  (A1c 5.8%/2008) and has had no symptoms of reactive hypoglycemia, diabetic polys, paresthesias or visual blurring.  Last A1c was Normal & at goal: Lab Results  Component Value Date   HGBA1C 5.1 03/21/2017      Further, the patient also has history of Vitamin D Deficiency  ("19"/2009)  and supplements vitamin D without any suspected side-effects. Last vitamin D was at goal: Lab Results  Component Value Date   VD25OH 82 03/21/2017   Current Outpatient Medications on File Prior to Visit  Medication Sig  . acetaminophen (TYLENOL) 325 MG tablet Take 2 tablets (650 mg total) by mouth every 4 (four) hours as needed for mild pain ((score 1 to 3) or temp > 100.5).  Marland Kitchen ALPRAZolam (XANAX) 1 MG tablet Take 1/2 to 1 tablet 2 to 3 x / day Only if needed for Anxiety Attack and please try to limit use <5 day/week  . Ascorbic Acid (VITAMIN C PO) Take 1 tablet by mouth daily as needed (for immune system support).  . ASPIRIN 81 PO Take by mouth.  Marland Kitchen atenolol (TENORMIN) 100 MG tablet Take 1 tablet daily for BP  . atorvastatin (LIPITOR) 40 MG tablet TAKE 1 TABLET BY MOUTH EVERY DAY FOR CHOLESTEROL  . buPROPion  (WELLBUTRIN XL) 300 MG 24 hr tablet TAKE 1 TABLET BY MOUTH  DAILY FOR MOOD (Patient taking differently: Take 300 mg by mouth daily. TAKE 1 TABLET BY MOUTH  DAILY FOR MOOD)  . clopidogrel (PLAVIX) 75 MG tablet TAKE 1 TABLET BY MOUTH EVERY DAY  . diclofenac sodium (VOLTAREN) 1 % GEL Apply 2-4 g topically 4 (four) times daily as needed (for knee pain.).  Marland Kitchen diltiazem (CARDIZEM CD) 240 MG 24 hr capsule TAKE 1 CAPSULE BY MOUTH  DAILY FOR BLOOD PRESSURE (Patient taking differently: Take 240 mg by mouth daily. TAKE 1 CAPSULE BY MOUTH  DAILY FOR BLOOD PRESSURE)  . doxazosin (CARDURA) 8 MG tablet TAKE 1/2 TO 1 TABLET BY MOUTH DAILY FOR BLOOD PRESSURE AND PROSTATE  . fenofibrate 160 MG tablet Take 1 tablet (160 mg total) by mouth daily. (Patient not taking: Reported on 06/25/2017)  . furosemide (LASIX) 80 MG tablet TAKE 1 TABLET BY MOUTH EVERY DAY FOR BLOOD PRESSURE AND FLUID (Patient taking differently: Take 80 mg by mouth daily. TAKE 1 TABLET BY MOUTH EVERY DAY FOR BLOOD PRESSURE AND FLUID)  . Multiple Vitamin (MULTIVITAMIN WITH MINERALS) TABS Take 2 tablets by mouth daily. Mega Men Multivitamin.  . nitroGLYCERIN (NITROSTAT) 0.4 MG SL tablet PLACE 1 TABLET (0.4 MG TOTAL)  UNDER THE TONGUE EVERY 5 (FIVE) MINUTES AS NEEDED FOR CHEST PAIN.  Marland Kitchen olmesartan (BENICAR) 40 MG tablet DO NOT TAKE THIS MEDICATION UNLESS BP IS GREATER THAN 140/90 (Patient taking differently: 20 mg. DO NOT TAKE THIS MEDICATION UNLESS BP IS GREATER THAN 140/90)  . pantoprazole (PROTONIX) 40 MG tablet Take 40 mg by mouth daily as needed.  . ranitidine (ZANTAC) 75 MG tablet Take 150 mg by mouth daily as needed for heartburn.  . testosterone cypionate (DEPOTESTOSTERONE CYPIONATE) 200 MG/ML injection INJECT 2ML INTRAMUSCULARLY EVERY 12 DAYS   No current facility-administered medications on file prior to visit.    Allergies  Allergen Reactions  . Hctz [Hydrochlorothiazide]   . Tape Rash    Clear tape   PMHx:   Past Medical History:    Diagnosis Date  . Anxiety   . Arthritis   . Atrial flutter, paroxysmal ([place]) 01/04/2011  . CAD (coronary artery disease), native coronary artery 01/04/2011  . Diverticulitis   . Diverticulosis   . GERD (gastroesophageal reflux disease)   . Hemarthrosis of knee, right 01/04/2011  . Hemorrhoids   . History of meniscectomy of right knee 01/02/2011  . Hyperlipemia 01/04/2011  . Hyperlipidemia   . Hypertension   . Meniscus tear 01/02/2011  . NSVT (nonsustained ventricular tachycardia) ([place]) 01/02/2011  . Primary osteoarthritis of right knee 04/03/2017  . Right knee pain 01/02/2011  . Stented coronary artery 01/04/2011   LAD DES Resolute.  . Torn medial meniscus    right knee   Immunization History  Administered Date(s) Administered  . Influenza Split 12/16/2013, 12/02/2014  . Influenza Whole 12/09/2012  . Influenza, Seasonal, Injecte, Preservative Fre 12/01/2015  . Influenza-Unspecified 11/19/2016  . PPD Test 11/05/2013, 01/04/2015, 02/10/2016, 03/21/2017  . Td 02/19/2005  . Tdap 03/01/2015   Past Surgical History:  Procedure Laterality Date  . APPENDECTOMY    . COLONOSCOPY    . CORONARY ANGIOPLASTY WITH STENT PLACEMENT  2012  . HAND SURGERY     right  . KNEE ARTHROSCOPY     x 8 left  . KNEE ARTHROSCOPY Right 04/21/2012   Procedure: ARTHROSCOPY KNEE;  Surgeon: Lorn Junes, MD;  Location: [place];  Service: Orthopedics;  Laterality: Right;  medial and lateral meniscectomies  . LEFT HEART CATHETERIZATION WITH CORONARY ANGIOGRAM N/A 01/03/2011   Procedure: LEFT HEART CATHETERIZATION WITH CORONARY ANGIOGRAM;  Surgeon: Lorretta Harp, MD;  Location[facility] CATH LAB;  Service: Cardiovascular;  Laterality: N/A;  . PERCUTANEOUS CORONARY STENT INTERVENTION (PCI-S) N/A 01/03/2011   Procedure: PERCUTANEOUS CORONARY STENT INTERVENTION (PCI-S);  Surgeon: Lorretta Harp, MD;  Location: [facility] CATH LAB;  Service: Cardiovascular;  Laterality: N/A;  . ROTATOR CUFF REPAIR     bilateral  .  TONSILLECTOMY    . TOTAL KNEE ARTHROPLASTY Right 04/15/2017  . TOTAL KNEE ARTHROPLASTY Right 04/15/2017   Procedure: TOTAL KNEE ARTHROPLASTY;  Surgeon: Elsie Saas, MD;  Location: [place];  Service: Orthopedics;  Laterality: Right;  . TRICEPS TENDON REPAIR Bilateral    FHx:    Reviewed / unchanged  SHx:    Reviewed / unchanged   Systems Review:  Constitutional: Denies fever, chills, wt changes, headaches, insomnia, fatigue, night sweats, change in appetite. Eyes: Denies redness, blurred vision, diplopia, discharge, itchy, watery eyes.  ENT: Denies discharge, congestion, post nasal drip, epistaxis, sore throat, earache, hearing loss, dental pain, tinnitus, vertigo, sinus pain, snoring.  CV: Denies chest pain, palpitations, irregular heartbeat, syncope, dyspnea, diaphoresis, orthopnea, PND, claudication or edema. Respiratory: denies cough, dyspnea,  DOE, pleurisy, hoarseness, laryngitis, wheezing.  Gastrointestinal: Denies dysphagia, odynophagia, heartburn, reflux, water brash, abdominal pain or cramps, nausea, vomiting, bloating, diarrhea, constipation, hematemesis, melena, hematochezia  or hemorrhoids. Genitourinary: Denies dysuria, frequency, urgency, nocturia, hesitancy, discharge, hematuria or flank pain. Musculoskeletal: Denies arthralgias, myalgias, stiffness, jt. swelling, pain, limping or strain/sprain.  Skin: Denies pruritus, rash, hives, warts, acne, eczema or change in skin lesion(s). Neuro: No weakness, tremor, incoordination, spasms, paresthesia or pain. Psychiatric: Denies confusion, memory loss or sensory loss. Endo: Denies change in weight, skin or hair change.  Heme/Lymph: No excessive bleeding, bruising or enlarged lymph nodes.  Physical Exam  BP 110/64   Pulse 60   Temp 97.9 F (36.6 C)   Resp 18   Ht 5' 7.5" (1.715 m)   Wt 200 lb 6.4 oz (90.9 kg)   BMI 30.92 kg/m   Appears  well nourished, well groomed  and in no distress.  Eyes: PERRLA, EOMs, conjunctiva no  swelling or erythema. Sinuses: No frontal/maxillary tenderness ENT/Mouth: EAC's clear, TM's nl w/o erythema, bulging. Nares clear w/o erythema, swelling, exudates. Oropharynx clear without erythema or exudates. Oral hygiene is good. Tongue normal, non obstructing. Hearing intact.  Neck: Supple. Thyroid not palpable. Car 2+/2+ without bruits, nodes or JVD. Chest: Respirations nl with BS clear & equal w/o rales, rhonchi, wheezing or stridor.  Cor: Heart sounds normal w/ regular rate and rhythm without sig. murmurs, gallops, clicks or rubs. Peripheral pulses normal and equal  without edema.  Abdomen: Soft & bowel sounds normal. Non-tender w/o guarding, rebound, hernias, masses or organomegaly.  Lymphatics: Unremarkable.  Musculoskeletal: Full ROM all peripheral extremities, joint stability, 5/5 strength and normal gait.  Skin: Warm, dry without exposed rashes, lesions or ecchymosis apparent.  Neuro: Cranial nerves intact, reflexes equal bilaterally. Sensory-motor testing grossly intact. Tendon reflexes grossly intact.  Pysch: Alert & oriented x 3.  Insight and judgement nl & appropriate. No ideations.  Assessment and Plan:  1. Essential hypertension  - Continue medication, monitor blood pressure at home.  - Continue DASH diet.  Reminder to go to the ER if any CP,  SOB, nausea, dizziness, severe HA, changes vision/speech.  - CBC with Differential/Platelet - COMPLETE METABOLIC PANEL WITH GFR - Magnesium - TSH  2. Hyperlipidemia, mixed  - Continue diet/meds, exercise,& lifestyle modifications.  - Continue monitor periodic cholesterol/liver & renal functions   - Lipid panel - TSH  3. Prediabetes  - Continue diet, exercise, lifestyle modifications.  - Monitor appropriate labs.]  - Hemoglobin A1c - Insulin, random  4. Vitamin D deficiency  - Continue supplementation.   - VITAMIN D 25 Hydroxyl  5. Coronary artery disease involving native coronary artery, angina presence  unspecified, unspecified whether native or transplanted heart  - Lipid panel  6. Chronic kidney disease, stage 4 (severe) (HCC)  - COMPLETE METABOLIC PANEL WITH GFR  7. Gastroesophageal reflux disease, esophagitis presence not specified  - CBC with Differential/Platelet  8. Testosterone deficiency  - Testosterone  9. Medication management  - CBC with Differential/Platelet - COMPLETE METABOLIC PANEL WITH GFR - Magnesium - Lipid panel - TSH - Hemoglobin A1c - Insulin, random - VITAMIN D 25 Hydroxyl - Testosterone      Discussed  regular exercise, BP monitoring, weight control to achieve/maintain BMI less than 25 and discussed med and SE's. Recommended labs to assess and monitor clinical status with further disposition pending results of labs. Over 30 minutes of exam, counseling, chart review was performed.

## 2017-10-10 NOTE — Patient Instructions (Signed)

## 2017-10-11 ENCOUNTER — Encounter (HOSPITAL_COMMUNITY): Payer: Self-pay

## 2017-10-11 LAB — CBC WITH DIFFERENTIAL/PLATELET
BASOS PCT: 0.3 %
Basophils Absolute: 19 cells/uL (ref 0–200)
EOS ABS: 143 {cells}/uL (ref 15–500)
Eosinophils Relative: 2.3 %
HCT: 42.6 % (ref 38.5–50.0)
HEMOGLOBIN: 14.5 g/dL (ref 13.2–17.1)
Lymphs Abs: 1705 cells/uL (ref 850–3900)
MCH: 32.3 pg (ref 27.0–33.0)
MCHC: 34 g/dL (ref 32.0–36.0)
MCV: 94.9 fL (ref 80.0–100.0)
MONOS PCT: 11.7 %
MPV: 10.9 fL (ref 7.5–12.5)
NEUTROS ABS: 3608 {cells}/uL (ref 1500–7800)
Neutrophils Relative %: 58.2 %
PLATELETS: 185 10*3/uL (ref 140–400)
RBC: 4.49 10*6/uL (ref 4.20–5.80)
RDW: 14.1 % (ref 11.0–15.0)
TOTAL LYMPHOCYTE: 27.5 %
WBC: 6.2 10*3/uL (ref 3.8–10.8)
WBCMIX: 725 {cells}/uL (ref 200–950)

## 2017-10-11 LAB — COMPLETE METABOLIC PANEL WITH GFR
AG Ratio: 1.6 (calc) (ref 1.0–2.5)
ALBUMIN MSPROF: 4.2 g/dL (ref 3.6–5.1)
ALT: 39 U/L (ref 9–46)
AST: 38 U/L — ABNORMAL HIGH (ref 10–35)
Alkaline phosphatase (APISO): 56 U/L (ref 40–115)
BUN / CREAT RATIO: 17 (calc) (ref 6–22)
BUN: 25 mg/dL (ref 7–25)
CO2: 21 mmol/L (ref 20–32)
Calcium: 9.9 mg/dL (ref 8.6–10.3)
Chloride: 106 mmol/L (ref 98–110)
Creat: 1.48 mg/dL — ABNORMAL HIGH (ref 0.70–1.25)
GFR, EST AFRICAN AMERICAN: 58 mL/min/{1.73_m2} — AB (ref >=60)
GFR, EST NON AFRICAN AMERICAN: 50 mL/min/{1.73_m2} — AB (ref >=60)
GLOBULIN: 2.6 g/dL (ref 1.9–3.7)
GLUCOSE: 88 mg/dL (ref 65–99)
Potassium: 3.9 mmol/L (ref 3.5–5.3)
Sodium: 141 mmol/L (ref 135–146)
TOTAL PROTEIN: 6.8 g/dL (ref 6.1–8.1)
Total Bilirubin: 0.6 mg/dL (ref 0.2–1.2)

## 2017-10-11 LAB — LIPID PANEL
CHOL/HDL RATIO: 2.7 (calc) (ref <5.0)
Cholesterol: 139 mg/dL (ref <200)
HDL: 52 mg/dL (ref >40)
LDL Cholesterol (Calc): 63 mg/dL (calc)
NON-HDL CHOLESTEROL (CALC): 87 mg/dL (ref <130)
Triglycerides: 158 mg/dL — ABNORMAL HIGH (ref <150)

## 2017-10-11 LAB — MAGNESIUM: Magnesium: 1.5 mg/dL (ref 1.5–2.5)

## 2017-10-11 LAB — HEMOGLOBIN A1C
EAG (MMOL/L): 5.7 (calc)
HEMOGLOBIN A1C: 5.2 %{Hb} (ref <5.7)
MEAN PLASMA GLUCOSE: 103 (calc)

## 2017-10-11 LAB — TSH: TSH: 0.83 m[IU]/L (ref 0.40–4.50)

## 2017-10-11 LAB — VITAMIN D 25 HYDROXY (VIT D DEFICIENCY, FRACTURES): Vit D, 25-Hydroxy: 106 ng/mL — ABNORMAL HIGH (ref 30–100)

## 2017-10-11 LAB — TESTOSTERONE: Testosterone: 945 ng/dL — ABNORMAL HIGH (ref 250–827)

## 2017-10-11 LAB — INSULIN, RANDOM: Insulin: 6.5 u[IU]/mL (ref 2.0–19.6)

## 2017-10-13 ENCOUNTER — Encounter: Payer: Self-pay | Admitting: Internal Medicine

## 2017-10-13 DIAGNOSIS — N183 Chronic kidney disease, stage 3 unspecified: Secondary | ICD-10-CM

## 2017-10-13 DIAGNOSIS — I130 Hypertensive heart and chronic kidney disease with heart failure and stage 1 through stage 4 chronic kidney disease, or unspecified chronic kidney disease: Secondary | ICD-10-CM | POA: Insufficient documentation

## 2017-10-13 HISTORY — DX: Chronic kidney disease, stage 3 unspecified: N18.30

## 2017-10-14 ENCOUNTER — Encounter (HOSPITAL_COMMUNITY): Payer: Self-pay

## 2017-10-16 ENCOUNTER — Encounter (HOSPITAL_COMMUNITY): Payer: Self-pay

## 2017-10-17 ENCOUNTER — Other Ambulatory Visit: Payer: Self-pay | Admitting: Internal Medicine

## 2017-10-17 DIAGNOSIS — K6289 Other specified diseases of anus and rectum: Secondary | ICD-10-CM

## 2017-10-17 MED ORDER — DESOXIMETASONE 0.25 % EX OINT: TOPICAL_OINTMENT | CUTANEOUS | 3 refills | Status: DC

## 2017-10-18 ENCOUNTER — Encounter (HOSPITAL_COMMUNITY): Payer: Self-pay

## 2017-10-22 ENCOUNTER — Other Ambulatory Visit: Payer: Self-pay | Admitting: *Deleted

## 2017-10-22 ENCOUNTER — Other Ambulatory Visit: Payer: Self-pay | Admitting: Internal Medicine

## 2017-10-22 DIAGNOSIS — F419 Anxiety disorder, unspecified: Secondary | ICD-10-CM

## 2017-10-22 MED ORDER — NITROGLYCERIN 0.4 MG SL SUBL: SUBLINGUAL_TABLET | SUBLINGUAL | 1 refills | Status: DC

## 2017-10-23 ENCOUNTER — Encounter (HOSPITAL_COMMUNITY)
Admission: RE | Admit: 2017-10-23 | Discharge: 2017-10-23 | Disposition: A | Discharge status: Home / Self Care | Payer: 59 | Source: Ambulatory Visit | Attending: Cardiovascular Disease | Admitting: Cardiovascular Disease

## 2017-10-23 DIAGNOSIS — I2510 Atherosclerotic heart disease of native coronary artery without angina pectoris: Secondary | ICD-10-CM | POA: Insufficient documentation

## 2017-10-25 ENCOUNTER — Encounter (HOSPITAL_COMMUNITY): Payer: Self-pay

## 2017-10-28 ENCOUNTER — Encounter (HOSPITAL_COMMUNITY)
Admission: RE | Admit: 2017-10-28 | Discharge: 2017-10-28 | Disposition: A | Discharge status: Home / Self Care | Payer: 59 | Source: Ambulatory Visit | Attending: Cardiovascular Disease | Admitting: Cardiovascular Disease

## 2017-10-30 ENCOUNTER — Encounter (HOSPITAL_COMMUNITY): Payer: Self-pay

## 2017-11-01 ENCOUNTER — Encounter (HOSPITAL_COMMUNITY)
Admission: RE | Admit: 2017-11-01 | Discharge: 2017-11-01 | Disposition: A | Discharge status: Home / Self Care | Payer: Self-pay | Source: Ambulatory Visit | Attending: Cardiovascular Disease | Admitting: Cardiovascular Disease

## 2017-11-04 ENCOUNTER — Encounter (HOSPITAL_COMMUNITY): Payer: Self-pay

## 2017-11-06 ENCOUNTER — Encounter (HOSPITAL_COMMUNITY): Payer: Self-pay

## 2017-11-08 ENCOUNTER — Encounter (HOSPITAL_COMMUNITY): Payer: Self-pay

## 2017-11-11 ENCOUNTER — Encounter (HOSPITAL_COMMUNITY)
Admission: RE | Admit: 2017-11-11 | Discharge: 2017-11-11 | Disposition: A | Discharge status: Home / Self Care | Payer: 59 | Source: Ambulatory Visit | Attending: Cardiovascular Disease | Admitting: Cardiovascular Disease

## 2017-11-12 ENCOUNTER — Other Ambulatory Visit: Payer: Self-pay | Admitting: Internal Medicine

## 2017-11-13 ENCOUNTER — Encounter (HOSPITAL_COMMUNITY): Payer: Self-pay

## 2017-11-15 ENCOUNTER — Encounter (HOSPITAL_COMMUNITY)
Admission: RE | Admit: 2017-11-15 | Discharge: 2017-11-15 | Disposition: A | Discharge status: Home / Self Care | Payer: Self-pay | Source: Ambulatory Visit | Attending: Cardiovascular Disease | Admitting: Cardiovascular Disease

## 2017-11-18 ENCOUNTER — Encounter (HOSPITAL_COMMUNITY): Payer: Self-pay

## 2017-11-20 ENCOUNTER — Encounter (HOSPITAL_COMMUNITY): Payer: Self-pay

## 2017-11-20 ENCOUNTER — Other Ambulatory Visit: Payer: Self-pay | Admitting: Internal Medicine

## 2017-11-20 DIAGNOSIS — I2510 Atherosclerotic heart disease of native coronary artery without angina pectoris: Secondary | ICD-10-CM | POA: Insufficient documentation

## 2017-11-20 DIAGNOSIS — K219 Gastro-esophageal reflux disease without esophagitis: Secondary | ICD-10-CM

## 2017-11-20 MED ORDER — FAMOTIDINE 40 MG PO TABS: ORAL_TABLET | ORAL | 3 refills | Status: DC

## 2017-11-22 ENCOUNTER — Encounter (HOSPITAL_COMMUNITY): Payer: Self-pay

## 2017-11-25 ENCOUNTER — Encounter (HOSPITAL_COMMUNITY): Payer: Self-pay

## 2017-11-27 ENCOUNTER — Encounter (HOSPITAL_COMMUNITY): Payer: Self-pay

## 2017-11-29 ENCOUNTER — Encounter (HOSPITAL_COMMUNITY): Payer: Self-pay

## 2017-12-02 ENCOUNTER — Encounter (HOSPITAL_COMMUNITY)
Admission: RE | Admit: 2017-12-02 | Discharge: 2017-12-02 | Disposition: A | Discharge status: Home / Self Care | Payer: 59 | Source: Ambulatory Visit | Attending: Cardiovascular Disease | Admitting: Cardiovascular Disease

## 2017-12-04 ENCOUNTER — Encounter (HOSPITAL_COMMUNITY)
Admission: RE | Admit: 2017-12-04 | Discharge: 2017-12-04 | Disposition: A | Discharge status: Home / Self Care | Payer: 59 | Source: Ambulatory Visit | Attending: Cardiovascular Disease | Admitting: Cardiovascular Disease

## 2017-12-06 ENCOUNTER — Encounter (HOSPITAL_COMMUNITY): Payer: Self-pay

## 2017-12-09 ENCOUNTER — Encounter (HOSPITAL_COMMUNITY): Payer: Self-pay

## 2017-12-11 ENCOUNTER — Encounter (HOSPITAL_COMMUNITY): Payer: Self-pay

## 2017-12-13 ENCOUNTER — Encounter (HOSPITAL_COMMUNITY): Payer: Self-pay

## 2017-12-16 ENCOUNTER — Ambulatory Visit: Payer: 59 | Admitting: Internal Medicine

## 2017-12-16 ENCOUNTER — Encounter (HOSPITAL_COMMUNITY): Payer: Self-pay

## 2017-12-16 ENCOUNTER — Encounter: Payer: Self-pay | Admitting: Internal Medicine

## 2017-12-16 VITALS — BP 124/76 | HR 72 | Temp 97.7°F | Resp 16 | Ht 67.5 in | Wt 206.2 lb

## 2017-12-16 DIAGNOSIS — M545 Low back pain, unspecified: Secondary | ICD-10-CM

## 2017-12-16 MED ORDER — DEXAMETHASONE 0.5 MG PO TABS: ORAL_TABLET | ORAL | 0 refills | Status: DC

## 2017-12-16 NOTE — Patient Instructions (Signed)
Back Pain, Adult Many adults have back pain from time to time. Common causes of back pain include:  A strained muscle or ligament.  Wear and tear (degeneration) of the spinal disks.  Arthritis.  A hit to the back.  Back pain can be short-lived (acute) or last a long time (chronic). A physical exam, lab tests, and imaging studies may be done to find the cause of your pain. Follow these instructions at home: Managing pain and stiffness  Take over-the-counter and prescription medicines only as told by your health care provider.  If directed, apply heat to the affected area as often as told by your health care provider. Use the heat source that your health care provider recommends, such as a moist heat pack or a heating pad. ? Place a towel between your skin and the heat source. ? Leave the heat on for 20-30 minutes. ? Remove the heat if your skin turns bright red. This is especially important if you are unable to feel pain, heat, or cold. You have a greater risk of getting burned.  If directed, apply ice to the injured area: ? Put ice in a plastic bag. ? Place a towel between your skin and the bag. ? Leave the ice on for 20 minutes, 2-3 times a day for the first 2-3 days. Activity  Do not stay in bed. Resting more than 1-2 days can delay your recovery.  Take short walks on even surfaces as soon as you are able. Try to increase the length of time you walk each day.  Do not sit, drive, or stand in one place for more than 30 minutes at a time. Sitting or standing for long periods of time can put stress on your back.  Use proper lifting techniques. When you bend and lift, use positions that put less stress on your back: ? Bend your knees. ? Keep the load close to your body. ? Avoid twisting.  Exercise regularly as told by your health care provider. Exercising will help your back heal faster. This also helps prevent back injuries by keeping muscles strong and flexible.  Your health  care provider may recommend that you see a physical therapist. This person can help you come up with a safe exercise program. Do any exercises as told by your physical therapist. Lifestyle  Maintain a healthy weight. Extra weight puts stress on your back and makes it difficult to have good posture.  Avoid activities or situations that make you feel anxious or stressed. Learn ways to manage anxiety and stress. One way to manage stress is through exercise. Stress and anxiety increase muscle tension and can make back pain worse. General instructions  Sleep on a firm mattress in a comfortable position. Try lying on your side with your knees slightly bent. If you lie on your back, put a pillow under your knees.  Follow your treatment plan as told by your health care provider. This may include: ? Cognitive or behavioral therapy. ? Acupuncture or massage therapy. ? Meditation or yoga. Contact a health care provider if:  You have pain that is not relieved with rest or medicine.  You have increasing pain going down into your legs or buttocks.  Your pain does not improve in 2 weeks.  You have pain at night.  You lose weight.  You have a fever or chills. Get help right away if:  You develop new bowel or bladder control problems.  You have unusual weakness or numbness in your arms   or legs.  You develop nausea or vomiting.  You develop abdominal pain.  You feel faint. Summary  Many adults have back pain from time to time. A physical exam, lab tests, and imaging studies may be done to find the cause of your pain.  Use proper lifting techniques. When you bend and lift, use positions that put less stress on your back.  Take over-the-counter and prescription medicines and apply heat or ice as directed by your health care provider. ++++++++++++++++++++++++++++++++++++++ Sacroiliac Joint Dysfunction Sacroiliac joint dysfunction is a condition that causes inflammation on one or both sides  of the sacroiliac (SI) joint. The SI joint connects the lower part of the spine (sacrum) with the two upper portions of the pelvis (ilium). This condition causes deep aching or burning pain in the low back. In some cases, the pain may also spread into one or both buttocks or hips or spread down the legs. What are the causes? This condition may be caused by:  Pregnancy. During pregnancy, extra stress is put on the SI joints because the pelvis widens.  Injury, such as: ? Car accidents. ? Sport-related injuries. ? Work-related injuries.  Having one leg that is shorter than the other.  Conditions that affect the joints, such as: ? Rheumatoid arthritis. ? Gout. ? Psoriatic arthritis. ? Joint infection (septic arthritis).  Sometimes, the cause of SI joint dysfunction is not known. What are the signs or symptoms? Symptoms of this condition include:  Aching or burning pain in the lower back. The pain may also spread to other areas, such as: ? Buttocks. ? Groin. ? Thighs and legs.  Muscle spasms in or around the painful areas.  Increased pain when standing, walking, running, stair climbing, bending, or lifting.  How is this diagnosed? Your health care provider will do a physical exam and take your medical history. During the exam, the health care provider may move one or both of your legs to different positions to check for pain. Various tests may be done to help verify the diagnosis, including:  Imaging tests to look for other causes of pain. These may include: ? MRI. ? CT scan. ? Bone scan.  Diagnostic injection. A numbing medicine is injected into the SI joint using a needle. If the pain is temporarily improved or stopped after the injection, this can indicate that SI joint dysfunction is the problem.  How is this treated? Treatment may vary depending on the cause and severity of your condition. Treatment options may include:  Applying ice or heat to the lower back area. This  can help to reduce pain and muscle spasms.  Medicines to relieve pain or inflammation or to relax the muscles.  Wearing a back brace (sacroiliac brace) to help support the joint while your back is healing.  Physical therapy to increase muscle strength around the joint and flexibility at the joint. This may also involve learning proper body positions and ways of moving to relieve stress on the joint.  Direct manipulation of the SI joint.  Injections of steroid medicine into the joint in order to reduce pain and swelling.  Radiofrequency ablation to burn away nerves that are carrying pain messages from the joint.  Use of a device that provides electrical stimulation in order to reduce pain at the joint.  Surgery to put in screws and plates that limit or prevent joint motion. This is rare.  Follow these instructions at home:  Rest as needed. Limit your activities as directed by your health  care provider.  Take medicines only as directed by your health care provider.  If directed, apply ice to the affected area: ? Put ice in a plastic bag. ? Place a towel between your skin and the bag. ? Leave the ice on for 20 minutes, 2-3 times per day.  Use a heating pad or a moist heat pack as directed by your health care provider.  Exercise as directed by your health care provider or physical therapist.  Keep all follow-up visits as directed by your health care provider. This is important. Contact a health care provider if:  Your pain is not controlled with medicine.  You have a fever.  You have increasingly severe pain. Get help right away if:  You have weakness, numbness, or tingling in your legs or feet.  You lose control of your bladder or bowel.

## 2017-12-16 NOTE — Progress Notes (Signed)
Subjective:    Patient ID: Marvin Carter, male    DOB: 09/17/1955, 62 y.o.   MRN: [id number]  HPI   This nice 62 yo MWM presents with 2 week hx/o LBP w/o radiation to the buttocks or legs. Denies injury.    Medication Sig  . acetaminophen  325 MG  Take 2 tablets  every 4  hours as needed   . ALPRAZolam  1 MG  TAKE 1/2 TO 1 TABLET  3 x/ DAy  . VITAMIN C Take 1 tabletdaily a  . ASPIRIN 81 PO Take daily  . atenolol  100 MG Take 1 tablet daily for BP  . atorvastatin  40 MG TAKE 1 TABLET  EVERY DAY   . buPROPion-XL 300 MG TAKE 1 TABLET BY MOUTH  DAILY   . clopidogrel  75 MG  TAKE 1 TABLET BY MOUTH EVERY DAY  . Desoximetasone  0.25 % oint Apply 4 x /day to irritated rash  . diclofenac sodium  1 % GEL Apply 2-4 g topically 4 x / daily as needed  . Diltiazem-CD 240 MG  TAKE 1 CAPSULE BY MOUTH  DAILY   . doxazosin  8 MG  TAKE 1/2 TO 1 TABLET  DAILY   . famotidine 40 MG  Take 1 tablet daily for acid indigestion & reflux  . furosemide  80 MG tablet Take 80 mg by mouth daily.   Coletta Memos Men Multivitamin. Take 2 tablets by mouth daily.  Marland Kitchen NITROSTAT 0.4 MG SL tablet AS NEEDED FOR CHEST PAIN.  Marland Kitchen olmesartan 40 MG tablet Take 1/2 to 1 tablet daily   . pantoprazole 40 MG tablet Take  daily as needed.  . testosterone cypio 200 MG injec INJECT 22 ml  IM EVERY 12 DAYS  . fenofibrate 160 MG tablet Patient not taking: Reported on 06/25/2017)   Allergies  Allergen Reactions  . Hctz [Hydrochlorothiazide]   . Tape Rash    Clear tape   Past Medical History:  Diagnosis Date  . Anxiety   . Arthritis   . Atrial flutter, paroxysmal ([place]) 01/04/2011  . CAD (coronary artery disease), native coronary artery 01/04/2011  . Diverticulitis   . Diverticulosis   . GERD (gastroesophageal reflux disease)   . Hemarthrosis of knee, right 01/04/2011  . Hemorrhoids   . History of meniscectomy of right knee 01/02/2011  . Hyperlipemia 01/04/2011  . Hyperlipidemia   . Hypertension   . Meniscus tear 01/02/2011  . NSVT  (nonsustained ventricular tachycardia) ([place]) 01/02/2011  . Primary osteoarthritis of right knee 04/03/2017  . Right knee pain 01/02/2011  . Stented coronary artery 01/04/2011   LAD DES Resolute.  . Torn medial meniscus    right knee   Past Surgical History:  Procedure Laterality Date  . APPENDECTOMY    . COLONOSCOPY    . CORONARY ANGIOPLASTY WITH STENT PLACEMENT  2012  . HAND SURGERY     right  . KNEE ARTHROSCOPY     x 8 left  . KNEE ARTHROSCOPY Right 04/21/2012   Procedure: ARTHROSCOPY KNEE;  Surgeon: Lorn Junes, MD;  Location: [place];  Service: Orthopedics;  Laterality: Right;  medial and lateral meniscectomies  . LEFT HEART CATHETERIZATION WITH CORONARY ANGIOGRAM N/A 01/03/2011   Procedure: LEFT HEART CATHETERIZATION WITH CORONARY ANGIOGRAM;  Surgeon: Lorretta Harp, MD;  Location: [facility] CATH LAB;  Service: Cardiovascular;  Laterality: N/A;  . PERCUTANEOUS CORONARY STENT INTERVENTION (PCI-S) N/A 01/03/2011   Procedure: PERCUTANEOUS CORONARY STENT INTERVENTION (PCI-S);  Surgeon: Roderic Palau  Adora Fridge, MD;  Location: [place] CATH LAB;  Service: Cardiovascular;  Laterality: N/A;  . ROTATOR CUFF REPAIR     bilateral  . TONSILLECTOMY    . TOTAL KNEE ARTHROPLASTY Right 04/15/2017  . TOTAL KNEE ARTHROPLASTY Right 04/15/2017   Procedure: TOTAL KNEE ARTHROPLASTY;  Surgeon: Elsie Saas, MD;  Location: [place];  Service: Orthopedics;  Laterality: Right;  . TRICEPS TENDON REPAIR Bilateral    Review of Systems      10 point systems review negative except as above.    Objective:   Physical Exam  BP 124/76   Pulse 72   Temp 97.7 F (36.5 C)   Resp 16   Ht 5' 7.5" (1.715 m)   Wt 206 lb 3.2 oz (93.5 kg)   BMI 31.82 kg/m   HEENT - WNL. Neck - supple.  Chest - Clear equal BS. Cor - Nl HS. RRR w/o sig m.  MS- FROM w/o deformities. Neg SLR bilat. (+) tender Rt>LT SI jt. Slow careful Gait. Neuro -  Nl w/o focal abnormalities.    Assessment & Plan:   1. Acute bilateral low back pain without  sciatica  - dexamethasone  0.5 MG tablet; Take 1 tab 3 x day - 3 days, then 2 x day - 3 days, then 1 tab daily  Dispense: 20 tablet; Refill: 0  - recc heat   - Has Alprazolam to take at HS.

## 2017-12-18 ENCOUNTER — Encounter (HOSPITAL_COMMUNITY): Payer: Self-pay

## 2017-12-20 ENCOUNTER — Encounter (HOSPITAL_COMMUNITY)
Admission: RE | Admit: 2017-12-20 | Discharge: 2017-12-20 | Disposition: A | Discharge status: Home / Self Care | Payer: Self-pay | Source: Ambulatory Visit | Attending: Cardiovascular Disease | Admitting: Cardiovascular Disease

## 2017-12-20 DIAGNOSIS — I2510 Atherosclerotic heart disease of native coronary artery without angina pectoris: Secondary | ICD-10-CM | POA: Insufficient documentation

## 2017-12-23 ENCOUNTER — Encounter (HOSPITAL_COMMUNITY): Payer: Self-pay

## 2017-12-24 ENCOUNTER — Other Ambulatory Visit: Payer: Self-pay | Admitting: Internal Medicine

## 2017-12-24 MED ORDER — DEXAMETHASONE 4 MG PO TABS: ORAL_TABLET | ORAL | 0 refills | Status: DC

## 2017-12-25 ENCOUNTER — Encounter (HOSPITAL_COMMUNITY): Payer: Self-pay

## 2017-12-27 ENCOUNTER — Encounter (HOSPITAL_COMMUNITY): Payer: Self-pay

## 2017-12-30 ENCOUNTER — Encounter (HOSPITAL_COMMUNITY): Payer: Self-pay

## 2017-12-30 DIAGNOSIS — M545 Low back pain: Secondary | ICD-10-CM | POA: Diagnosis not present

## 2018-01-01 ENCOUNTER — Encounter (HOSPITAL_COMMUNITY): Payer: Self-pay

## 2018-01-02 ENCOUNTER — Other Ambulatory Visit: Payer: Self-pay | Admitting: Internal Medicine

## 2018-01-02 DIAGNOSIS — M1990 Unspecified osteoarthritis, unspecified site: Secondary | ICD-10-CM

## 2018-01-02 MED ORDER — DICLOFENAC SODIUM 1 % TD GEL: TRANSDERMAL | 11 refills | Status: AC

## 2018-01-03 ENCOUNTER — Encounter (HOSPITAL_COMMUNITY): Payer: Self-pay

## 2018-01-06 ENCOUNTER — Encounter (HOSPITAL_COMMUNITY)
Admission: RE | Admit: 2018-01-06 | Discharge: 2018-01-06 | Disposition: A | Discharge status: Home / Self Care | Payer: Self-pay | Source: Ambulatory Visit | Attending: Cardiovascular Disease | Admitting: Cardiovascular Disease

## 2018-01-08 ENCOUNTER — Encounter (HOSPITAL_COMMUNITY): Payer: Self-pay

## 2018-01-10 ENCOUNTER — Encounter (HOSPITAL_COMMUNITY): Payer: Self-pay

## 2018-01-10 DIAGNOSIS — E669 Obesity, unspecified: Secondary | ICD-10-CM | POA: Insufficient documentation

## 2018-01-10 DIAGNOSIS — E66811 Obesity, class 1: Secondary | ICD-10-CM

## 2018-01-10 HISTORY — DX: Obesity, class 1: E66.811

## 2018-01-10 NOTE — Progress Notes (Signed)
FOLLOW UP  Assessment and Plan:   CAD Followed by cardiology, continues with cardiac rehab Control blood pressure, cholesterol, glucose, increase exercise.   Hypertension Well controlled with current medications  Monitor blood pressure at home; patient to call if consistently greater than 130/80 Continue DASH diet.   Reminder to go to the ER if any CP, SOB, nausea, dizziness, severe HA, changes vision/speech, left arm numbness and tingling and jaw pain.  Cholesterol Currently at goal; continue statin therapy - has recently stopped taking fenofibrate, also quit drinking wine daily. Will restart medication only if indicated  Continue low cholesterol diet and exercise.  Check lipid panel.   Other abnormal glucose Recent A1Cs at goal Discussed diet/exercise, weight management  Defer A1C; check CMP  Obesity with co morbidities Long discussion about weight loss, diet, and exercise Recommended diet heavy in fruits and veggies and low in animal meats, cheeses, and dairy products, appropriate calorie intake Discussed ideal weight for height  Will follow up in 3 months  Vitamin D Def At goal at last visit; continue supplementation to maintain goal of 70-100 Defer Vit D level  Hypogonadism - continue replacement therapy, check testosterone levels as needed.   GERD Well managed on current medications  Discussed diet, avoiding triggers and other lifestyle changes  Insomnia Patient is on Benzos, due to new guidelines we discussed decreasing benzo use.  Discussed trazodone alternately, will send in 50 mg, take 1/2-1 tab.  good sleep hygiene discussed, increase day time activity   Continue diet and meds as discussed. Further disposition pending results of labs. Discussed med's effects and SE's.   Over 30 minutes of exam, counseling, chart review, and critical decision making was performed.     ----------------------------------------------------------------------------------------------------------------------  HPI 62 y.o. male  presents for 3 month follow up on hypertension, cholesterol, glucose management, anxiety, weight and vitamin D deficiency. Patient had a negative heart cath in 2009 and then presented in 2012 with ACS undergoing PCA/DES. Patient continues out-patient cardiac rehab 3 x/week.     he has a diagnosis of anxiety and is currently on xanax 1mg  daily, reports symptoms are well controlled on current regimen. he reports he typically takes this medication at night to assist with sleep. He is on wellbutrin 300 mg during the day for mood and focus.   he has a diagnosis of GERD which is currently managed by protonix, ranitidine - he alternates between the two to avoid taking protonix daily.  he reports symptoms is currently well controlled, and denies breakthrough reflux, burning in chest, hoarseness or cough.    BMI is Body mass index is 30.83 kg/m., he has been working on diet and exercise.  Wt Readings from Last 3 Encounters:  01/13/18 199 lb 12.8 oz (90.6 kg)  12/16/17 206 lb 3.2 oz (93.5 kg)  10/10/17 200 lb 6.4 oz (90.9 kg)   His blood pressure has been controlled at home, today their BP is BP: 110/76  He does workout. He denies chest pain, shortness of breath, dizziness.   He is on cholesterol medication (atorvastatin 40 mg daily) and denies myalgias. His cholesterol is at goal. The cholesterol last visit was:   Lab Results  Component Value Date   CHOL 139 10/10/2017   HDL 52 10/10/2017   LDLCALC 63 10/10/2017   TRIG 158 (H) 10/10/2017   CHOLHDL 2.7 10/10/2017    He has been working on diet and exercise for glucose management, and denies foot ulcerations, increased appetite, nausea, paresthesia of the feet, polydipsia,  polyuria, visual disturbances, vomiting and weight loss. Last A1C in the office was:  Lab Results  Component Value Date   HGBA1C 5.2  10/10/2017   Patient is on Vitamin D supplement.   Lab Results  Component Value Date   VD25OH 106 (H) 10/10/2017     He has a history of testosterone deficiency and is on testosterone replacement, last injection was 5 days ago. He states that the testosterone helps with his energy, libido, muscle mass. Lab Results  Component Value Date   TESTOSTERONE 945 (H) 10/10/2017    Current Medications:  Current Outpatient Medications on File Prior to Visit  Medication Sig  . acetaminophen (TYLENOL) 325 MG tablet Take 2 tablets (650 mg total) by mouth every 4 (four) hours as needed for mild pain ((score 1 to 3) or temp > 100.5).  Marland Kitchen ALPRAZolam (XANAX) 1 MG tablet TAKE 1/2 TO 1 TABLET BY MOUTH THREE TIMES DAILY AS NEEDED FOR ANXIETY  . Ascorbic Acid (VITAMIN C PO) Take 1 tablet by mouth daily as needed (for immune system support).  . ASPIRIN 81 PO Take by mouth.  Marland Kitchen atenolol (TENORMIN) 100 MG tablet Take 1 tablet daily for BP  . atorvastatin (LIPITOR) 40 MG tablet TAKE 1 TABLET BY MOUTH EVERY DAY FOR CHOLESTEROL  . buPROPion (WELLBUTRIN XL) 300 MG 24 hr tablet TAKE 1 TABLET BY MOUTH  DAILY FOR MOOD (Patient taking differently: Take 300 mg by mouth daily. TAKE 1 TABLET BY MOUTH  DAILY FOR MOOD)  . clopidogrel (PLAVIX) 75 MG tablet TAKE 1 TABLET BY MOUTH EVERY DAY  . Desoximetasone (TOPICORT) 0.25 % ointment Apply 4 x /day to irritated rash  . dexamethasone (DECADRON) 4 MG tablet Take 1 tab /day - 5 days, then 1 tablet every other day  . diclofenac sodium (VOLTAREN) 1 % GEL Apply 2 to 4 grams 2 to 4 x / day  . diltiazem (CARDIZEM CD) 240 MG 24 hr capsule TAKE 1 CAPSULE BY MOUTH  DAILY FOR BLOOD PRESSURE (Patient taking differently: Take 240 mg by mouth daily. TAKE 1 CAPSULE BY MOUTH  DAILY FOR BLOOD PRESSURE)  . doxazosin (CARDURA) 8 MG tablet TAKE 1/2 TO 1 TABLET BY MOUTH DAILY FOR BLOOD PRESSURE AND PROSTATE  . famotidine (PEPCID) 40 MG tablet Take 1 tablet daily for acid indigestion & reflux  .  furosemide (LASIX) 80 MG tablet TAKE 1 TABLET BY MOUTH EVERY DAY FOR BLOOD PRESSURE AND FLUID (Patient taking differently: Take 80 mg by mouth daily. TAKE 1 TABLET BY MOUTH EVERY DAY FOR BLOOD PRESSURE AND FLUID)  . Multiple Vitamin (MULTIVITAMIN WITH MINERALS) TABS Take 2 tablets by mouth daily. Mega Men Multivitamin.  . nitroGLYCERIN (NITROSTAT) 0.4 MG SL tablet PLACE 1 TABLET (0.4 MG TOTAL) UNDER THE TONGUE EVERY 5 (FIVE) MINUTES AS NEEDED FOR CHEST PAIN.  Marland Kitchen olmesartan (BENICAR) 40 MG tablet DO NOT TAKE THIS MEDICATION UNLESS BP IS GREATER THAN 140/90 (Patient taking differently: 20 mg. DO NOT TAKE THIS MEDICATION UNLESS BP IS GREATER THAN 140/90)  . pantoprazole (PROTONIX) 40 MG tablet Take 40 mg by mouth daily as needed.  . testosterone cypionate (DEPOTESTOSTERONE CYPIONATE) 200 MG/ML injection INJECT 2ML INTRAMUSCULARLY EVERY 12 DAYS   No current facility-administered medications on file prior to visit.      Allergies:  Allergies  Allergen Reactions  . Hctz [Hydrochlorothiazide]   . Tape Rash    Clear tape     Medical History:  Past Medical History:  Diagnosis Date  . Anxiety   .  Arthritis   . Atrial flutter, paroxysmal ([place]) 01/04/2011  . CAD (coronary artery disease), native coronary artery 01/04/2011  . Diverticulitis   . Diverticulosis   . GERD (gastroesophageal reflux disease)   . Hemarthrosis of knee, right 01/04/2011  . Hemorrhoids   . History of meniscectomy of right knee 01/02/2011  . Hyperlipemia 01/04/2011  . Hyperlipidemia   . Hypertension   . Meniscus tear 01/02/2011  . NSVT (nonsustained ventricular tachycardia) ([place]) 01/02/2011  . Primary osteoarthritis of right knee 04/03/2017  . Right knee pain 01/02/2011  . Stented coronary artery 01/04/2011   LAD DES Resolute.  . Torn medial meniscus    right knee   Family history- Reviewed and unchanged Social history- Reviewed and unchanged   Review of Systems:  Review of Systems  Constitutional: Negative  for malaise/fatigue and weight loss.  HENT: Negative for hearing loss and tinnitus.   Eyes: Negative for blurred vision and double vision.  Respiratory: Negative for cough, shortness of breath and wheezing.   Cardiovascular: Negative for chest pain, palpitations, orthopnea, claudication and leg swelling.  Gastrointestinal: Negative for abdominal pain, blood in stool, constipation, diarrhea, heartburn, melena, nausea and vomiting.  Genitourinary: Negative.   Musculoskeletal: Positive for back pain (arthritis per ortho, intermittent). Negative for joint pain and myalgias.  Skin: Negative for rash.  Neurological: Negative for dizziness, tingling, sensory change, weakness and headaches.  Endo/Heme/Allergies: Negative for polydipsia.  Psychiatric/Behavioral: Negative for depression and substance abuse. The patient has insomnia. The patient is not nervous/anxious.   All other systems reviewed and are negative.     Physical Exam: BP 110/76   Pulse (!) 58   Temp 98.1 F (36.7 C)   Ht 5' 7.5" (1.715 m)   Wt 199 lb 12.8 oz (90.6 kg)   SpO2 96%   BMI 30.83 kg/m  Wt Readings from Last 3 Encounters:  01/13/18 199 lb 12.8 oz (90.6 kg)  12/16/17 206 lb 3.2 oz (93.5 kg)  10/10/17 200 lb 6.4 oz (90.9 kg)   General Appearance: Well nourished, in no apparent distress. Eyes: PERRLA, EOMs, conjunctiva no swelling or erythema Sinuses: No Frontal/maxillary tenderness ENT/Mouth: Ext aud canals clear, TMs without erythema, bulging. No erythema, swelling, or exudate on post pharynx.  Tonsils not swollen or erythematous. Hearing normal.  Neck: Supple, thyroid normal.  Respiratory: Respiratory effort normal, BS equal bilaterally without rales, rhonchi, wheezing or stridor.  Cardio: RRR with no MRGs. Brisk peripheral pulses without edema.  Abdomen: Soft, + BS.  Non tender, no guarding, rebound, hernias, masses. Lymphatics: Non tender without lymphadenopathy.  Musculoskeletal: Full ROM, 5/5 strength,  Normal gait, lumbar non-tender, no muscle spasms, neg straight leg raise Skin: Warm, dry without rashes, lesions, ecchymosis.  Neuro: Cranial nerves intact. No cerebellar symptoms.  Psych: Awake and oriented X 3, normal affect, Insight and Judgment appropriate.    Izora Ribas, NP 3:54 PM [facility]

## 2018-01-11 ENCOUNTER — Other Ambulatory Visit: Payer: Self-pay | Admitting: Cardiovascular Disease

## 2018-01-13 ENCOUNTER — Encounter: Payer: Self-pay | Admitting: Adult Health

## 2018-01-13 ENCOUNTER — Ambulatory Visit: Payer: 59 | Admitting: Adult Health

## 2018-01-13 ENCOUNTER — Encounter (HOSPITAL_COMMUNITY): Payer: Self-pay

## 2018-01-13 VITALS — BP 110/76 | HR 58 | Temp 98.1°F | Ht 67.5 in | Wt 199.8 lb

## 2018-01-13 DIAGNOSIS — I2510 Atherosclerotic heart disease of native coronary artery without angina pectoris: Secondary | ICD-10-CM

## 2018-01-13 DIAGNOSIS — R7309 Other abnormal glucose: Secondary | ICD-10-CM

## 2018-01-13 DIAGNOSIS — K219 Gastro-esophageal reflux disease without esophagitis: Secondary | ICD-10-CM

## 2018-01-13 DIAGNOSIS — I10 Essential (primary) hypertension: Secondary | ICD-10-CM

## 2018-01-13 DIAGNOSIS — Z79899 Other long term (current) drug therapy: Secondary | ICD-10-CM

## 2018-01-13 DIAGNOSIS — E782 Mixed hyperlipidemia: Secondary | ICD-10-CM | POA: Diagnosis not present

## 2018-01-13 DIAGNOSIS — E669 Obesity, unspecified: Secondary | ICD-10-CM

## 2018-01-13 DIAGNOSIS — E349 Endocrine disorder, unspecified: Secondary | ICD-10-CM

## 2018-01-13 DIAGNOSIS — E66811 Obesity, class 1: Secondary | ICD-10-CM

## 2018-01-13 DIAGNOSIS — E559 Vitamin D deficiency, unspecified: Secondary | ICD-10-CM

## 2018-01-13 DIAGNOSIS — F419 Anxiety disorder, unspecified: Secondary | ICD-10-CM

## 2018-01-13 MED ORDER — TRAZODONE HCL 50 MG PO TABS: ORAL_TABLET | ORAL | 2 refills | Status: DC

## 2018-01-13 NOTE — Patient Instructions (Signed)
NEW GUIDELINES FOR BENOZOS  New guidelines suggest the benzodiazepines such as xanax are best short term, with prolonged use they lead to physical and psychological dependence. In addition, evidence suggest that for insomnia the effectiveness wanes in 4 weeks and the risks out weight their benefits. Use of these agents have been associated with dementia, falls, motor vehicle accidents and physical addiction. Decreasing these medication have been proven to show improvements in cognition, alertness, decrease of falls and daytime sedation.   We will start a slow taper, symptoms of withdrawal include, insomnia, anxiety, irritability, sweating and stomach or intestinal symptoms like diarrhea or nausea.   Recommend cutting down to 1/2 tab in the evening, and start taking trazodone 25-50 mg instead for sleep.      Insomnia Insomnia is frequent trouble falling and/or staying asleep. Insomnia can be a long term problem or a short term problem. Both are common. Insomnia can be a short term problem when the wakefulness is related to a certain stress or worry. Long term insomnia is often related to ongoing stress during waking hours and/or poor sleeping habits. Overtime, sleep deprivation itself can make the problem worse. Every little thing feels more severe because you are overtired and your ability to cope is decreased. CAUSES   Stress, anxiety, and depression.  Poor sleeping habits.  Distractions such as TV in the bedroom.  Naps close to bedtime.  Engaging in emotionally charged conversations before bed.  Technical reading before sleep.  Alcohol and other sedatives. They may make the problem worse. They can hurt normal sleep patterns and normal dream activity.  Stimulants such as caffeine for several hours prior to bedtime.  Pain syndromes and shortness of breath can cause insomnia.  Exercise late at night.  Changing time zones may cause sleeping problems (jet lag). It is sometimes  helpful to have someone observe your sleeping patterns. They should look for periods of not breathing during the night (sleep apnea). They should also look to see how long those periods last. If you live alone or observers are uncertain, you can also be observed at a sleep clinic where your sleep patterns will be professionally monitored. Sleep apnea requires a checkup and treatment. Give your caregivers your medical history. Give your caregivers observations your family has made about your sleep.  SYMPTOMS   Not feeling rested in the morning.  Anxiety and restlessness at bedtime.  Difficulty falling and staying asleep. TREATMENT   Your caregiver may prescribe treatment for an underlying medical disorders. Your caregiver can give advice or help if you are using alcohol or other drugs for self-medication. Treatment of underlying problems will usually eliminate insomnia problems.  Medications can be prescribed for short time use. They are generally not recommended for lengthy use.  Over-the-counter sleep medicines are not recommended for lengthy use. They can be habit forming.  You can promote easier sleeping by making lifestyle changes such as:  Using relaxation techniques that help with breathing and reduce muscle tension.  Exercising earlier in the day.  Changing your diet and the time of your last meal. No night time snacks.  Establish a regular time to go to bed.  Counseling can help with stressful problems and worry.  Soothing music and white noise may be helpful if there are background noises you cannot remove.  Stop tedious detailed work at least one hour before bedtime. HOME CARE INSTRUCTIONS   Keep a diary. Inform your caregiver about your progress. This includes any medication side effects. See your  caregiver regularly. Take note of:  Times when you are asleep.  Times when you are awake during the night.  The quality of your sleep.  How you feel the next day. This  information will help your caregiver care for you.  Get out of bed if you are still awake after 15 minutes. Read or do some quiet activity. Keep the lights down. Wait until you feel sleepy and go back to bed.  Keep regular sleeping and waking hours. Avoid naps.  Exercise regularly.  Avoid distractions at bedtime. Distractions include watching television or engaging in any intense or detailed activity like attempting to balance the household checkbook.  Develop a bedtime ritual. Keep a familiar routine of bathing, brushing your teeth, climbing into bed at the same time each night, listening to soothing music. Routines increase the success of falling to sleep faster.  Use relaxation techniques. This can be using breathing and muscle tension release routines. It can also include visualizing peaceful scenes. You can also help control troubling or intruding thoughts by keeping your mind occupied with boring or repetitive thoughts like the old concept of counting sheep. You can make it more creative like imagining planting one beautiful flower after another in your backyard garden.  During your day, work to eliminate stress. When this is not possible use some of the previous suggestions to help reduce the anxiety that accompanies stressful situations. MAKE SURE YOU:   Understand these instructions.  Will watch your condition.  Will get help right away if you are not doing well or get worse. Document Released: 02/03/2000 Document Revised: 04/30/2011 Document Reviewed: 03/05/2007 [facility] Patient Information 2015 [place], [place]. This information is not intended to replace advice given to you by your health care provider. Make sure you discuss any questions you have with your health care provider.    Trazodone tablets What is this medicine? TRAZODONE (TRAZ oh done) is used to treat depression. This medicine may be used for other purposes; ask your health care provider or pharmacist if you have  questions. COMMON BRAND NAME(S): Desyrel What should I tell my health care provider before I take this medicine? They need to know if you have any of these conditions: -attempted suicide or thinking about it -bipolar disorder -bleeding problems -glaucoma -heart disease, or previous heart attack -irregular heart beat -kidney or liver disease -low levels of sodium in the blood -an unusual or allergic reaction to trazodone, other medicines, foods, dyes or preservatives -pregnant or trying to get pregnant -breast-feeding How should I use this medicine? Take this medicine by mouth with a glass of water. Follow the directions on the prescription label. Take this medicine shortly after a meal or a light snack. Take your medicine at regular intervals. Do not take your medicine more often than directed. Do not stop taking this medicine suddenly except upon the advice of your doctor. Stopping this medicine too quickly may cause serious side effects or your condition may worsen. A special MedGuide will be given to you by the pharmacist with each prescription and refill. Be sure to read this information carefully each time. Talk to your pediatrician regarding the use of this medicine in children. Special care may be needed. Overdosage: If you think you have taken too much of this medicine contact a poison control center or emergency room at once. NOTE: This medicine is only for you. Do not share this medicine with others. What if I miss a dose? If you miss a dose, take it as  soon as you can. If it is almost time for your next dose, take only that dose. Do not take double or extra doses. What may interact with this medicine? Do not take this medicine with any of the following medications: -certain medicines for fungal infections like fluconazole, itraconazole, ketoconazole, posaconazole, voriconazole -cisapride -dofetilide -dronedarone -linezolid -MAOIs like Carbex, Eldepryl, Marplan, Nardil, and  Parnate -mesoridazine -methylene blue (injected into a vein) -pimozide -saquinavir -thioridazine -ziprasidone This medicine may also interact with the following medications: -alcohol -antiviral medicines for HIV or AIDS -aspirin and aspirin-like medicines -barbiturates like phenobarbital -certain medicines for blood pressure, heart disease, irregular heart beat -certain medicines for depression, anxiety, or psychotic disturbances -certain medicines for migraine headache like almotriptan, eletriptan, frovatriptan, naratriptan, rizatriptan, sumatriptan, zolmitriptan -certain medicines for seizures like carbamazepine and phenytoin -certain medicines for sleep -certain medicines that treat or prevent blood clots like dalteparin, enoxaparin, warfarin -digoxin -fentanyl -lithium -NSAIDS, medicines for pain and inflammation, like ibuprofen or naproxen -other medicines that prolong the QT interval (cause an abnormal heart rhythm) -rasagiline -supplements like St. John's wort, kava kava, valerian -tramadol -tryptophan This list may not describe all possible interactions. Give your health care provider a list of all the medicines, herbs, non-prescription drugs, or dietary supplements you use. Also tell them if you smoke, drink alcohol, or use illegal drugs. Some items may interact with your medicine. What should I watch for while using this medicine? Tell your doctor if your symptoms do not get better or if they get worse. Visit your doctor or health care professional for regular checks on your progress. Because it may take several weeks to see the full effects of this medicine, it is important to continue your treatment as prescribed by your doctor. Patients and their families should watch out for new or worsening thoughts of suicide or depression. Also watch out for sudden changes in feelings such as feeling anxious, agitated, panicky, irritable, hostile, aggressive, impulsive, severely  restless, overly excited and hyperactive, or not being able to sleep. If this happens, especially at the beginning of treatment or after a change in dose, call your health care professional. Dennis Bast may get drowsy or dizzy. Do not drive, use machinery, or do anything that needs mental alertness until you know how this medicine affects you. Do not stand or sit up quickly, especially if you are an older patient. This reduces the risk of dizzy or fainting spells. Alcohol may interfere with the effect of this medicine. Avoid alcoholic drinks. This medicine may cause dry eyes and blurred vision. If you wear contact lenses you may feel some discomfort. Lubricating drops may help. See your eye doctor if the problem does not go away or is severe. Your mouth may get dry. Chewing sugarless gum, sucking hard candy and drinking plenty of water may help. Contact your doctor if the problem does not go away or is severe. What side effects may I notice from receiving this medicine? Side effects that you should report to your doctor or health care professional as soon as possible: -allergic reactions like skin rash, itching or hives, swelling of the face, lips, or tongue -elevated mood, decreased need for sleep, racing thoughts, impulsive behavior -confusion -fast, irregular heartbeat -feeling faint or lightheaded, falls -feeling agitated, angry, or irritable -loss of balance or coordination -painful or prolonged erections -restlessness, pacing, inability to keep still -suicidal thoughts or other mood changes -tremors -trouble sleeping -seizures -unusual bleeding or bruising Side effects that usually do not require medical attention (report  to your doctor or health care professional if they continue or are bothersome): -change in sex drive or performance -change in appetite or weight -constipation -headache -muscle aches or pains -nausea This list may not describe all possible side effects. Call your doctor  for medical advice about side effects. You may report side effects to FDA at 1-800-FDA-1088. Where should I keep my medicine? Keep out of the reach of children. Store at room temperature between 15 and 30 degrees C (59 to 86 degrees F). Protect from light. Keep container tightly closed. Throw away any unused medicine after the expiration date. NOTE: This sheet is a summary. It may not cover all possible information. If you have questions about this medicine, talk to your doctor, pharmacist, or health care provider.  2018 [employer] Standard (2015-07-07 16:57:05)

## 2018-01-13 NOTE — Telephone Encounter (Signed)
Rx request sent to pharmacy.  

## 2018-01-14 LAB — CBC WITH DIFFERENTIAL/PLATELET
BASOS PCT: 0.3 %
Basophils Absolute: 27 cells/uL (ref 0–200)
EOS ABS: 82 {cells}/uL (ref 15–500)
EOS PCT: 0.9 %
HCT: 42.1 % (ref 38.5–50.0)
HEMOGLOBIN: 14.5 g/dL (ref 13.2–17.1)
Lymphs Abs: 1993 cells/uL (ref 850–3900)
MCH: 33.5 pg — AB (ref 27.0–33.0)
MCHC: 34.4 g/dL (ref 32.0–36.0)
MCV: 97.2 fL (ref 80.0–100.0)
MONOS PCT: 10.3 %
MPV: 10.3 fL (ref 7.5–12.5)
Neutro Abs: 6061 cells/uL (ref 1500–7800)
Neutrophils Relative %: 66.6 %
PLATELETS: 157 10*3/uL (ref 140–400)
RBC: 4.33 10*6/uL (ref 4.20–5.80)
RDW: 13.6 % (ref 11.0–15.0)
TOTAL LYMPHOCYTE: 21.9 %
WBC: 9.1 10*3/uL (ref 3.8–10.8)
WBCMIX: 937 {cells}/uL (ref 200–950)

## 2018-01-14 LAB — COMPLETE METABOLIC PANEL WITH GFR
AG RATIO: 1.7 (calc) (ref 1.0–2.5)
ALKALINE PHOSPHATASE (APISO): 43 U/L (ref 40–115)
ALT: 32 U/L (ref 9–46)
AST: 42 U/L — ABNORMAL HIGH (ref 10–35)
Albumin: 4 g/dL (ref 3.6–5.1)
BILIRUBIN TOTAL: 0.9 mg/dL (ref 0.2–1.2)
BUN/Creatinine Ratio: 19 (calc) (ref 6–22)
BUN: 27 mg/dL — ABNORMAL HIGH (ref 7–25)
CHLORIDE: 104 mmol/L (ref 98–110)
CO2: 25 mmol/L (ref 20–32)
Calcium: 9.5 mg/dL (ref 8.6–10.3)
Creat: 1.39 mg/dL — ABNORMAL HIGH (ref 0.70–1.25)
GFR, EST AFRICAN AMERICAN: 63 mL/min/{1.73_m2} (ref >=60)
GFR, Est Non African American: 54 mL/min/{1.73_m2} — ABNORMAL LOW (ref >=60)
GLUCOSE: 82 mg/dL (ref 65–99)
Globulin: 2.4 g/dL (calc) (ref 1.9–3.7)
POTASSIUM: 3.5 mmol/L (ref 3.5–5.3)
SODIUM: 139 mmol/L (ref 135–146)
Total Protein: 6.4 g/dL (ref 6.1–8.1)

## 2018-01-14 LAB — HEMOGLOBIN A1C
EAG (MMOL/L): 5.7 (calc)
Hgb A1c MFr Bld: 5.2 % of total Hgb (ref <5.7)
Mean Plasma Glucose: 103 (calc)

## 2018-01-14 LAB — LIPID PANEL
Cholesterol: 156 mg/dL (ref <200)
HDL: 69 mg/dL (ref >40)
LDL Cholesterol (Calc): 65 mg/dL (calc)
Non-HDL Cholesterol (Calc): 87 mg/dL (calc) (ref <130)
Total CHOL/HDL Ratio: 2.3 (calc) (ref <5.0)
Triglycerides: 139 mg/dL (ref <150)

## 2018-01-14 LAB — VITAMIN D 25 HYDROXY (VIT D DEFICIENCY, FRACTURES): Vit D, 25-Hydroxy: 84 ng/mL (ref 30–100)

## 2018-01-14 LAB — TSH: TSH: 0.92 m[IU]/L (ref 0.40–4.50)

## 2018-01-14 LAB — MAGNESIUM: MAGNESIUM: 1.5 mg/dL (ref 1.5–2.5)

## 2018-01-15 ENCOUNTER — Encounter (HOSPITAL_COMMUNITY): Payer: Self-pay

## 2018-01-17 ENCOUNTER — Encounter (HOSPITAL_COMMUNITY): Payer: Self-pay

## 2018-01-20 ENCOUNTER — Encounter (HOSPITAL_COMMUNITY)
Admission: RE | Admit: 2018-01-20 | Discharge: 2018-01-20 | Disposition: A | Discharge status: Home / Self Care | Payer: Self-pay | Source: Ambulatory Visit | Attending: Cardiovascular Disease | Admitting: Cardiovascular Disease

## 2018-01-20 DIAGNOSIS — I2510 Atherosclerotic heart disease of native coronary artery without angina pectoris: Secondary | ICD-10-CM | POA: Insufficient documentation

## 2018-01-22 ENCOUNTER — Encounter (HOSPITAL_COMMUNITY): Payer: Self-pay

## 2018-01-24 ENCOUNTER — Encounter (HOSPITAL_COMMUNITY): Payer: Self-pay

## 2018-01-27 ENCOUNTER — Other Ambulatory Visit: Payer: Self-pay | Admitting: Internal Medicine

## 2018-01-27 ENCOUNTER — Encounter (HOSPITAL_COMMUNITY): Payer: Self-pay

## 2018-01-27 DIAGNOSIS — M545 Low back pain, unspecified: Secondary | ICD-10-CM

## 2018-01-27 MED ORDER — CYCLOBENZAPRINE HCL 10 MG PO TABS: ORAL_TABLET | ORAL | 0 refills | Status: DC

## 2018-01-29 ENCOUNTER — Encounter (HOSPITAL_COMMUNITY): Payer: Self-pay

## 2018-01-29 ENCOUNTER — Other Ambulatory Visit: Payer: Self-pay | Admitting: Internal Medicine

## 2018-01-31 ENCOUNTER — Encounter (HOSPITAL_COMMUNITY): Payer: Self-pay

## 2018-02-03 ENCOUNTER — Encounter (HOSPITAL_COMMUNITY): Payer: Self-pay

## 2018-02-05 ENCOUNTER — Encounter (HOSPITAL_COMMUNITY): Payer: Self-pay

## 2018-02-07 ENCOUNTER — Encounter (HOSPITAL_COMMUNITY): Payer: Self-pay

## 2018-02-08 ENCOUNTER — Other Ambulatory Visit: Payer: Self-pay | Admitting: Internal Medicine

## 2018-02-09 ENCOUNTER — Other Ambulatory Visit: Payer: Self-pay | Admitting: Internal Medicine

## 2018-02-09 ENCOUNTER — Other Ambulatory Visit: Payer: Self-pay | Admitting: Physician Assistant

## 2018-02-10 ENCOUNTER — Encounter (HOSPITAL_COMMUNITY): Payer: Self-pay

## 2018-02-10 ENCOUNTER — Other Ambulatory Visit: Payer: Self-pay | Admitting: Internal Medicine

## 2018-02-14 ENCOUNTER — Encounter (HOSPITAL_COMMUNITY): Payer: Self-pay

## 2018-02-17 ENCOUNTER — Other Ambulatory Visit: Payer: Self-pay | Admitting: Adult Health

## 2018-02-17 ENCOUNTER — Encounter (HOSPITAL_COMMUNITY): Payer: Self-pay

## 2018-02-17 DIAGNOSIS — F419 Anxiety disorder, unspecified: Secondary | ICD-10-CM

## 2018-02-17 MED ORDER — ALPRAZOLAM 1 MG PO TABS: ORAL_TABLET | ORAL | 0 refills | Status: DC

## 2018-02-21 ENCOUNTER — Encounter (HOSPITAL_COMMUNITY): Payer: Self-pay

## 2018-02-21 DIAGNOSIS — I2510 Atherosclerotic heart disease of native coronary artery without angina pectoris: Secondary | ICD-10-CM | POA: Insufficient documentation

## 2018-02-22 ENCOUNTER — Other Ambulatory Visit: Payer: Self-pay | Admitting: Internal Medicine

## 2018-02-22 MED ORDER — TESTOSTERONE CYPIONATE 200 MG/ML IM SOLN: INTRAMUSCULAR | 2 refills | Status: DC

## 2018-02-24 ENCOUNTER — Encounter (HOSPITAL_COMMUNITY): Payer: Self-pay

## 2018-02-26 ENCOUNTER — Encounter (HOSPITAL_COMMUNITY): Payer: Self-pay

## 2018-02-28 ENCOUNTER — Encounter (HOSPITAL_COMMUNITY): Payer: Self-pay

## 2018-03-03 ENCOUNTER — Encounter (HOSPITAL_COMMUNITY): Payer: Self-pay

## 2018-03-05 ENCOUNTER — Encounter (HOSPITAL_COMMUNITY): Payer: Self-pay

## 2018-03-07 ENCOUNTER — Encounter (HOSPITAL_COMMUNITY)
Admission: RE | Admit: 2018-03-07 | Discharge: 2018-03-07 | Disposition: A | Discharge status: Home / Self Care | Payer: Self-pay | Source: Ambulatory Visit | Attending: Cardiovascular Disease | Admitting: Cardiovascular Disease

## 2018-03-07 ENCOUNTER — Encounter (HOSPITAL_COMMUNITY): Payer: Self-pay

## 2018-03-09 ENCOUNTER — Other Ambulatory Visit: Payer: Self-pay | Admitting: Internal Medicine

## 2018-03-10 ENCOUNTER — Encounter (HOSPITAL_COMMUNITY): Payer: Self-pay

## 2018-03-12 ENCOUNTER — Encounter (HOSPITAL_COMMUNITY): Payer: Self-pay

## 2018-03-12 ENCOUNTER — Encounter (HOSPITAL_COMMUNITY)
Admission: RE | Admit: 2018-03-12 | Discharge: 2018-03-12 | Disposition: A | Discharge status: Home / Self Care | Payer: Self-pay | Source: Ambulatory Visit | Attending: Cardiovascular Disease | Admitting: Cardiovascular Disease

## 2018-03-14 ENCOUNTER — Encounter (HOSPITAL_COMMUNITY): Payer: Self-pay

## 2018-03-14 ENCOUNTER — Encounter (HOSPITAL_COMMUNITY)
Admission: RE | Admit: 2018-03-14 | Discharge: 2018-03-14 | Disposition: A | Discharge status: Home / Self Care | Payer: Self-pay | Source: Ambulatory Visit | Attending: Cardiovascular Disease | Admitting: Cardiovascular Disease

## 2018-03-17 ENCOUNTER — Encounter (HOSPITAL_COMMUNITY): Payer: Self-pay

## 2018-03-19 ENCOUNTER — Encounter (HOSPITAL_COMMUNITY): Payer: Self-pay

## 2018-03-21 ENCOUNTER — Encounter (HOSPITAL_COMMUNITY): Payer: Self-pay

## 2018-03-24 ENCOUNTER — Encounter (HOSPITAL_COMMUNITY): Payer: Self-pay

## 2018-03-24 ENCOUNTER — Encounter (HOSPITAL_COMMUNITY)
Admission: RE | Admit: 2018-03-24 | Discharge: 2018-03-24 | Disposition: A | Discharge status: Home / Self Care | Payer: Self-pay | Source: Ambulatory Visit | Attending: Cardiovascular Disease | Admitting: Cardiovascular Disease

## 2018-03-24 DIAGNOSIS — I2510 Atherosclerotic heart disease of native coronary artery without angina pectoris: Secondary | ICD-10-CM | POA: Insufficient documentation

## 2018-03-26 ENCOUNTER — Encounter (HOSPITAL_COMMUNITY)
Admission: RE | Admit: 2018-03-26 | Discharge: 2018-03-26 | Disposition: A | Discharge status: Home / Self Care | Payer: Self-pay | Source: Ambulatory Visit | Attending: Cardiovascular Disease | Admitting: Cardiovascular Disease

## 2018-03-26 ENCOUNTER — Encounter (HOSPITAL_COMMUNITY): Payer: Self-pay

## 2018-03-28 ENCOUNTER — Encounter (HOSPITAL_COMMUNITY): Payer: Self-pay

## 2018-03-28 ENCOUNTER — Encounter (HOSPITAL_COMMUNITY)
Admission: RE | Admit: 2018-03-28 | Discharge: 2018-03-28 | Disposition: A | Discharge status: Home / Self Care | Payer: Self-pay | Source: Ambulatory Visit | Attending: Cardiovascular Disease | Admitting: Cardiovascular Disease

## 2018-03-31 ENCOUNTER — Encounter (HOSPITAL_COMMUNITY)
Admission: RE | Admit: 2018-03-31 | Discharge: 2018-03-31 | Disposition: A | Discharge status: Home / Self Care | Payer: 59 | Source: Ambulatory Visit | Attending: Cardiovascular Disease | Admitting: Cardiovascular Disease

## 2018-03-31 ENCOUNTER — Encounter (HOSPITAL_COMMUNITY): Payer: Self-pay

## 2018-04-02 ENCOUNTER — Encounter (HOSPITAL_COMMUNITY)
Admission: RE | Admit: 2018-04-02 | Discharge: 2018-04-02 | Disposition: A | Discharge status: Home / Self Care | Payer: Self-pay | Source: Ambulatory Visit | Attending: Cardiovascular Disease | Admitting: Cardiovascular Disease

## 2018-04-02 ENCOUNTER — Encounter (HOSPITAL_COMMUNITY): Payer: Self-pay

## 2018-04-04 ENCOUNTER — Encounter (HOSPITAL_COMMUNITY): Payer: Self-pay

## 2018-04-07 ENCOUNTER — Encounter (HOSPITAL_COMMUNITY): Payer: Self-pay

## 2018-04-07 ENCOUNTER — Encounter (HOSPITAL_COMMUNITY)
Admission: RE | Admit: 2018-04-07 | Discharge: 2018-04-07 | Disposition: A | Discharge status: Home / Self Care | Payer: Self-pay | Source: Ambulatory Visit | Attending: Cardiovascular Disease | Admitting: Cardiovascular Disease

## 2018-04-09 ENCOUNTER — Encounter (HOSPITAL_COMMUNITY): Payer: Self-pay

## 2018-04-11 ENCOUNTER — Encounter (HOSPITAL_COMMUNITY)
Admission: RE | Admit: 2018-04-11 | Discharge: 2018-04-11 | Disposition: A | Discharge status: Home / Self Care | Payer: Self-pay | Source: Ambulatory Visit | Attending: Cardiovascular Disease | Admitting: Cardiovascular Disease

## 2018-04-11 ENCOUNTER — Encounter (HOSPITAL_COMMUNITY): Payer: Self-pay

## 2018-04-14 ENCOUNTER — Encounter (HOSPITAL_COMMUNITY)
Admission: RE | Admit: 2018-04-14 | Discharge: 2018-04-14 | Disposition: A | Discharge status: Home / Self Care | Payer: Self-pay | Source: Ambulatory Visit | Attending: Cardiovascular Disease | Admitting: Cardiovascular Disease

## 2018-04-14 ENCOUNTER — Encounter (HOSPITAL_COMMUNITY): Payer: Self-pay

## 2018-04-16 ENCOUNTER — Encounter (HOSPITAL_COMMUNITY): Payer: Self-pay

## 2018-04-16 ENCOUNTER — Encounter: Payer: Self-pay | Admitting: Internal Medicine

## 2018-04-16 ENCOUNTER — Other Ambulatory Visit: Payer: Self-pay | Admitting: *Deleted

## 2018-04-16 ENCOUNTER — Ambulatory Visit (INDEPENDENT_AMBULATORY_CARE_PROVIDER_SITE_OTHER): Payer: 59 | Admitting: Internal Medicine

## 2018-04-16 VITALS — BP 128/90 | HR 64 | Temp 97.0°F | Resp 16 | Ht 67.5 in | Wt 201.2 lb

## 2018-04-16 DIAGNOSIS — E559 Vitamin D deficiency, unspecified: Secondary | ICD-10-CM

## 2018-04-16 DIAGNOSIS — E782 Mixed hyperlipidemia: Secondary | ICD-10-CM

## 2018-04-16 DIAGNOSIS — Z136 Encounter for screening for cardiovascular disorders: Secondary | ICD-10-CM | POA: Diagnosis not present

## 2018-04-16 DIAGNOSIS — I10 Essential (primary) hypertension: Secondary | ICD-10-CM

## 2018-04-16 DIAGNOSIS — I2510 Atherosclerotic heart disease of native coronary artery without angina pectoris: Secondary | ICD-10-CM | POA: Diagnosis not present

## 2018-04-16 DIAGNOSIS — Z111 Encounter for screening for respiratory tuberculosis: Secondary | ICD-10-CM | POA: Diagnosis not present

## 2018-04-16 DIAGNOSIS — N183 Chronic kidney disease, stage 3 unspecified: Secondary | ICD-10-CM

## 2018-04-16 DIAGNOSIS — Z1212 Encounter for screening for malignant neoplasm of rectum: Secondary | ICD-10-CM

## 2018-04-16 DIAGNOSIS — N401 Enlarged prostate with lower urinary tract symptoms: Secondary | ICD-10-CM

## 2018-04-16 DIAGNOSIS — Z79899 Other long term (current) drug therapy: Secondary | ICD-10-CM | POA: Diagnosis not present

## 2018-04-16 DIAGNOSIS — R7303 Prediabetes: Secondary | ICD-10-CM

## 2018-04-16 DIAGNOSIS — Z125 Encounter for screening for malignant neoplasm of prostate: Secondary | ICD-10-CM | POA: Diagnosis not present

## 2018-04-16 DIAGNOSIS — K219 Gastro-esophageal reflux disease without esophagitis: Secondary | ICD-10-CM

## 2018-04-16 DIAGNOSIS — N138 Other obstructive and reflux uropathy: Secondary | ICD-10-CM

## 2018-04-16 DIAGNOSIS — Z1211 Encounter for screening for malignant neoplasm of colon: Secondary | ICD-10-CM

## 2018-04-16 DIAGNOSIS — Z0000 Encounter for general adult medical examination without abnormal findings: Secondary | ICD-10-CM | POA: Diagnosis not present

## 2018-04-16 DIAGNOSIS — Z8249 Family history of ischemic heart disease and other diseases of the circulatory system: Secondary | ICD-10-CM | POA: Diagnosis not present

## 2018-04-16 DIAGNOSIS — E349 Endocrine disorder, unspecified: Secondary | ICD-10-CM

## 2018-04-16 DIAGNOSIS — R5383 Other fatigue: Secondary | ICD-10-CM

## 2018-04-16 DIAGNOSIS — Z0001 Encounter for general adult medical examination with abnormal findings: Secondary | ICD-10-CM

## 2018-04-16 MED ORDER — PANTOPRAZOLE SODIUM 40 MG PO TBEC: 40.0000 mg | DELAYED_RELEASE_TABLET | Freq: Every day | ORAL | 1 refills | Status: DC | PRN

## 2018-04-16 MED ORDER — FINASTERIDE 5 MG PO TABS: ORAL_TABLET | ORAL | 3 refills | Status: DC

## 2018-04-16 NOTE — Progress Notes (Signed)
[facility]   Unk Pinto, M.D.     Uvaldo Bristle. Silverio Lay, P.A.-C Liane Comber, [place]                [address] [place], [place] [address] Telephone [phone] Telefax [phone] Annual  Screening/Preventative Visit  & Comprehensive Evaluation & Examination     This very nice 63 y.o. MWM presents for a Screening /Preventative Visit & comprehensive evaluation and management of multiple medical co-morbidities.  Patient has been followed for HTN, ASCAD, HLD, Prediabetes and Vitamin D Deficiency. Patient is somewhat stressed & depressed today relating his 52 yo granddaughter was dx'd 2 weeks ago with Stage 4 Neuroblastoma and is at [facility].      HTN predates since 1989. Patient's BP has been controlled at home.  Today's BP is borderline high normal diastolic - 867/67.  Patient had a negative heart cath in 2009, and then in 2012  Resented with ACS and had PCA/DES implanted.  Patient continues out-pt cardiac rehab 3 x /week since then. Patient is also known to have CKD3 felt consequent of his HTCVD. Patient denies any cardiac symptoms as chest pain, palpitations, shortness of breath, dizziness or ankle swelling.     Patient's hyperlipidemia is controlled with diet and medications. Patient denies myalgias or other medication SE's. Last lipids were at goal: Lab Results  Component Value Date   CHOL 156 01/13/2018   HDL 69 01/13/2018   LDLCALC 65 01/13/2018   TRIG 139 01/13/2018   CHOLHDL 2.3 01/13/2018      Patient has hx/o prediabetes (A1c 5.8% / 2008)  and patient denies reactive hypoglycemic symptoms, visual blurring, diabetic polys or paresthesias. Last A1c was Normal & at goal: Lab Results  Component Value Date   HGBA1C 5.2 01/13/2018       Finally, patient has history of Vitamin D Deficiency  ("19" / 2009)  and last vitamin D was  Lab Results  Component Value Date   VD25OH 22  01/13/2018   Current Outpatient Medications on File Prior to Visit  Medication Sig  . acetaminophen (TYLENOL) 325 MG tablet Take 2 tablets (650 mg total) by mouth every 4 (four) hours as needed for mild pain ((score 1 to 3) or temp > 100.5).  Marland Kitchen ALPRAZolam (XANAX) 1 MG tablet TAKE 1/2 TO 1 TABLET BY MOUTH UP TO THREE TIMES PER DAY AS NEEDED FOR ANXIETY. AVOID TAKING DAILY, LIMIT TO <5 days per week due to addictive potential.  . Ascorbic Acid (VITAMIN C PO) Take 1 tablet by mouth daily as needed (for immune system support).  . ASPIRIN 81 PO Take by mouth.  Marland Kitchen atenolol (TENORMIN) 100 MG tablet Take 1 tablet daily for BP  . atorvastatin (LIPITOR) 40 MG tablet TAKE 1 TABLET BY MOUTH EVERY DAY FOR CHOLESTEROL  . buPROPion (WELLBUTRIN XL) 300 MG 24 hr tablet Take 1 tablet every morning for Mood  And Focus/Concentration  . clopidogrel (PLAVIX) 75 MG tablet TAKE 1 TABLET BY MOUTH EVERY DAY  . cyclobenzaprine (FLEXERIL) 10 MG tablet Take 1/2 to 1 tablet  2 to 3 x /day if needed for Muscle Spasm  . Desoximetasone (TOPICORT) 0.25 % ointment Apply 4 x /day to irritated rash  . diclofenac sodium (VOLTAREN) 1 % GEL Apply 2 to 4 grams 2 to 4 x / day  . diltiazem (CARTIA XT) 240 MG  24 hr capsule TAKE 1 CAPSULE BY MOUTH EVERY DAY FOR BLOOD PRESSURE  . doxazosin (CARDURA) 8 MG tablet TAKE 1/2 TO 1 TABLET BY MOUTH DAILY FOR BLOOD PRESSURE AND PROSTATE  . famotidine (PEPCID) 40 MG tablet Take 1 tablet daily for acid indigestion & reflux  . furosemide (LASIX) 80 MG tablet Take 1 tablet daily for BP & Fluid Retention  . meloxicam (MOBIC) 15 MG tablet Take 1/2 to 1 tablet daily & limit to 4-5 days /week  to avoid kidney damage  . Multiple Vitamin (MULTIVITAMIN WITH MINERALS) TABS Take 2 tablets by mouth daily. Mega Men Multivitamin.  . nitroGLYCERIN (NITROSTAT) 0.4 MG SL tablet PLACE 1 TABLET UNDER THE TONGUE EVERY 5 MINUTES AS NEEDED FOR CHEST PAIN  . olmesartan (BENICAR) 40 MG tablet DO NOT TAKE THIS MEDICATION  UNLESS BP IS GREATER THAN 140/90 (Patient taking differently: 20 mg. DO NOT TAKE THIS MEDICATION UNLESS BP IS GREATER THAN 140/90)  . testosterone cypionate (DEPOTESTOSTERONE CYPIONATE) 200 MG/ML injection Inject 2 ml IM every 2 weeks  . traZODone (DESYREL) 50 MG tablet 1/2-1 tablet for sleep   No current facility-administered medications on file prior to visit.    Allergies  Allergen Reactions  . Hctz [Hydrochlorothiazide]   . Tape Rash    Clear tape   Past Medical History:  Diagnosis Date  . Anxiety   . Arthritis   . Atrial flutter, paroxysmal ([place]) 01/04/2011  . CAD (coronary artery disease), native coronary artery 01/04/2011  . Diverticulitis   . Diverticulosis   . GERD (gastroesophageal reflux disease)   . Hemarthrosis of knee, right 01/04/2011  . Hemorrhoids   . History of meniscectomy of right knee 01/02/2011  . Hyperlipemia 01/04/2011  . Hyperlipidemia   . Hypertension   . Meniscus tear 01/02/2011  . NSVT (nonsustained ventricular tachycardia) ([place]) 01/02/2011  . Primary osteoarthritis of right knee 04/03/2017  . Right knee pain 01/02/2011  . Stented coronary artery 01/04/2011   LAD DES Resolute.  . Torn medial meniscus    right knee   Health Maintenance  Topic Date Due  . COLONOSCOPY  10/25/2020  . TETANUS/TDAP  02/28/2025  . INFLUENZA VACCINE  Completed  . Hepatitis C Screening  Completed  . HIV Screening  Completed   Immunization History  Administered Date(s) Administered  . Influenza Split 12/16/2013, 12/02/2014  . Influenza Whole 12/09/2012  . Influenza, Seasonal, Injecte, Preservative Fre 12/01/2015  . Influenza-Unspecified 11/19/2016, 11/30/2017  . PPD Test 11/05/2013, 01/04/2015, 02/10/2016, 03/21/2017  . Td 02/19/2005  . Tdap 03/01/2015   Last Colon - 10/26/2010 - Dr Deatra Ina recc 10 yr f/u due Sept 2022.  Past Surgical History:  Procedure Laterality Date  . APPENDECTOMY    . COLONOSCOPY    . CORONARY ANGIOPLASTY WITH STENT PLACEMENT  2012  .  HAND SURGERY     right  . KNEE ARTHROSCOPY     x 8 left  . KNEE ARTHROSCOPY Right 04/21/2012   Procedure: ARTHROSCOPY KNEE;  Surgeon: Lorn Junes, MD;  Location: [facility];  Service: Orthopedics;  Laterality: Right;  medial and lateral meniscectomies  . LEFT HEART CATHETERIZATION WITH CORONARY ANGIOGRAM N/A 01/03/2011   Procedure: LEFT HEART CATHETERIZATION WITH CORONARY ANGIOGRAM;  Surgeon: Lorretta Harp, MD;  Location: [facility] CATH LAB;  Service: Cardiovascular;  Laterality: N/A;  . PERCUTANEOUS CORONARY STENT INTERVENTION (PCI-S) N/A 01/03/2011   Procedure: PERCUTANEOUS CORONARY STENT INTERVENTION (PCI-S);  Surgeon: Lorretta Harp, MD;  Location: [facility] CATH LAB;  Service: Cardiovascular;  Laterality:  N/A;  . ROTATOR CUFF REPAIR     bilateral  . TONSILLECTOMY    . TOTAL KNEE ARTHROPLASTY Right 04/15/2017  . TOTAL KNEE ARTHROPLASTY Right 04/15/2017   Procedure: TOTAL KNEE ARTHROPLASTY;  Surgeon: Elsie Saas, MD;  Location: [facility];  Service: Orthopedics;  Laterality: Right;  . TRICEPS TENDON REPAIR Bilateral    Family History  Problem Relation Age of Onset  . Diabetes Mother   . Heart disease Mother   . Heart attack Mother   . Heart disease Father   . Coronary artery disease Father   . Obesity Brother   . Diabetes Brother   . HIV/AIDS Brother   . Hypertension Brother    Social History   Socioeconomic History  . Marital status: Married    Spouse name: Not on file  . Number of children: 2  . Years of education: Not on file  . Highest education level: Not on file  Occupational History  . Occupation: Self Employed    Comment: Education administrator: [employer]  Tobacco Use  . Smoking status: Never Smoker  . Smokeless tobacco: Never Used  Substance and Sexual Activity  . Alcohol use: Yes    Alcohol/week: 10.0 standard drinks    Types: 10 Cans of beer per week    Comment: 1-2 beers a night  . Drug use: No  . Sexual activity: Active    ROS Constitutional:  Denies fever, chills, weight loss/gain, headaches, insomnia,  night sweats or change in appetite. Does c/o fatigue. Eyes: Denies redness, blurred vision, diplopia, discharge, itchy or watery eyes.  ENT: Denies discharge, congestion, post nasal drip, epistaxis, sore throat, earache, hearing loss, dental pain, Tinnitus, Vertigo, Sinus pain or snoring.  Cardio: Denies chest pain, palpitations, irregular heartbeat, syncope, dyspnea, diaphoresis, orthopnea, PND, claudication or edema Respiratory: denies cough, dyspnea, DOE, pleurisy, hoarseness, laryngitis or wheezing.  Gastrointestinal: Denies dysphagia, heartburn, reflux, water brash, pain, cramps, nausea, vomiting, bloating, diarrhea, constipation, hematemesis, melena, hematochezia, jaundice or hemorrhoids Genitourinary: Denies dysuria, frequency, discharge, hematuria or flank pain. Has urgency, nocturia x 2-3 & occasional hesitancy. Musculoskeletal: Denies arthralgia, myalgia, stiffness, Jt. Swelling, pain, limp or strain/sprain. Denies Falls. Skin: Denies puritis, rash, hives, warts, acne, eczema or change in skin lesion Neuro: No weakness, tremor, incoordination, spasms, paresthesia or pain Psychiatric: Denies confusion, memory loss or sensory loss. Denies Depression. Endocrine: Denies change in weight, skin, hair change, nocturia, and paresthesia, diabetic polys, visual blurring or hyper / hypo glycemic episodes.  Heme/Lymph: No excessive bleeding, bruising or enlarged lymph nodes.  Physical Exam  BP 128/90   Pulse 64   Temp (!) 97 F (36.1 C)   Resp 16   Ht 5' 7.5" (1.715 m)   Wt 201 lb 3.2 oz (91.3 kg)   BMI 31.05 kg/m   General Appearance: Well nourished and well groomed and in no apparent distress.  Eyes: PERRLA, EOMs, conjunctiva no swelling or erythema, normal fundi and vessels. Sinuses: No frontal/maxillary tenderness ENT/Mouth: EACs patent / TMs  nl. Nares clear without erythema, swelling, mucoid exudates. Oral hygiene is  good. No erythema, swelling, or exudate. Tongue normal, non-obstructing. Tonsils not swollen or erythematous. Hearing normal.  Neck: Supple, thyroid not palpable. No bruits, nodes or JVD. Respiratory: Respiratory effort normal.  BS equal and clear bilateral without rales, rhonci, wheezing or stridor. Cardio: Heart sounds are normal with regular rate and rhythm and no murmurs, rubs or gallops. Peripheral pulses are normal and equal bilaterally without edema. No aortic  or femoral bruits. Chest: symmetric with normal excursions and percussion.  Abdomen: Soft, with Nl bowel sounds. Nontender, no guarding, rebound, hernias, masses, or organomegaly.  Lymphatics: Non tender without lymphadenopathy.  Genitourinary: No hernias.Testes nl. DRE - prostate nl for age - smooth & firm w/o nodules. Musculoskeletal: Full ROM all peripheral extremities, joint stability, 5/5 strength, and normal gait. Skin: Warm and dry without rashes, lesions, cyanosis, clubbing or  ecchymosis.  Neuro: Cranial nerves intact, reflexes equal bilaterally. Normal muscle tone, no cerebellar symptoms. Sensation intact.  Pysch: Alert and oriented X 3 with normal affect, insight and judgment appropriate.   Assessment and Plan  1. Annual Preventative/Screening Exam   2. Essential hypertension  - EKG 12-Lead - [place], RETROPERITNL ABD,  LTD - Urinalysis, Routine w reflex microscopic - Microalbumin / creatinine urine ratio - COMPLETE METABOLIC PANEL WITH GFR - CBC with Differential/Platelet - Magnesium - TSH  3. Hyperlipidemia, mixed  - EKG 12-Lead - [place], RETROPERITNL ABD,  LTD - Lipid panel - TSH  4. Prediabetes  - EKG 12-Lead - [place], RETROPERITNL ABD,  LTD - Hemoglobin A1c - Insulin, random  5. Vitamin D deficiency  - VITAMIN D 25 Hydroxyl  6. Coronary artery disease involving native coronary artery of native heart without angina pectoris  - EKG 12-Lead - Lipid panel  7. Gastroesophageal reflux disease  - CBC with  Differential/Platelet  8. Testosterone deficiency  - Testosterone  9. CKD (chronic kidney disease) stage 3, GFR 30-59 ml/min (HCC)  - Urinalysis, Routine w reflex microscopic - Microalbumin / creatinine urine ratio  10. BPH with obstruction/lower urinary tract symptoms  - finasteride (PROSCAR) 5 MG tablet; Take 1 tablet daily for prostate  Dispense: 90 tablet; Refill: 3  11. Screening for colorectal cancer  - POC Hemoccult Bld/Stl - Patient declined to do due to his hemorrhoids.  12. Prostate cancer screening  - PSA  13. Screening for ischemic heart disease  - EKG 12-Lead  14. FHx: heart disease  - EKG 12-Lead - [place], RETROPERITNL ABD,  LTD  15. Screening for AAA (aortic abdominal aneurysm)  - [place], RETROPERITNL ABD,  LTD  16. Screening examination for pulmonary tuberculosis  - TB Skin Test  17. Fatigue, unspecified type  - Iron,Total/Total Iron Binding Cap - Vitamin B12 - TSH  18. Medication management  - Urinalysis, Routine w reflex microscopic - Microalbumin / creatinine urine ratio - Testosterone - COMPLETE METABOLIC PANEL WITH GFR - CBC with Differential/Platelet - Magnesium - Lipid panel - TSH - Hemoglobin A1c - Insulin, random - VITAMIN D 25 Hydroxyl         Patient was counseled in prudent diet, weight control to achieve/maintain BMI less than 25, BP monitoring, regular exercise and medications as discussed.  Discussed med effects and SE's. Routine screening labs and tests as requested with regular follow-up as recommended. Over 40 minutes of exam, counseling, chart review and high complex critical decision making was performed

## 2018-04-16 NOTE — Patient Instructions (Signed)

## 2018-04-17 LAB — PSA: PSA: 1.2 ng/mL (ref <=4.0)

## 2018-04-17 LAB — COMPLETE METABOLIC PANEL WITH GFR
AG Ratio: 1.8 (calc) (ref 1.0–2.5)
ALT: 35 U/L (ref 9–46)
AST: 43 U/L — ABNORMAL HIGH (ref 10–35)
Albumin: 4.7 g/dL (ref 3.6–5.1)
Alkaline phosphatase (APISO): 44 U/L (ref 35–144)
BUN/Creatinine Ratio: 17 (calc) (ref 6–22)
BUN: 23 mg/dL (ref 7–25)
CO2: 25 mmol/L (ref 20–32)
Calcium: 10.3 mg/dL (ref 8.6–10.3)
Chloride: 103 mmol/L (ref 98–110)
Creat: 1.32 mg/dL — ABNORMAL HIGH (ref 0.70–1.25)
GFR, Est African American: 66 mL/min/{1.73_m2} (ref >=60)
GFR, Est Non African American: 57 mL/min/{1.73_m2} — ABNORMAL LOW (ref >=60)
Globulin: 2.6 g/dL (calc) (ref 1.9–3.7)
Glucose, Bld: 93 mg/dL (ref 65–99)
Potassium: 4.2 mmol/L (ref 3.5–5.3)
Sodium: 138 mmol/L (ref 135–146)
Total Bilirubin: 0.9 mg/dL (ref 0.2–1.2)
Total Protein: 7.3 g/dL (ref 6.1–8.1)

## 2018-04-17 LAB — MICROALBUMIN / CREATININE URINE RATIO
Creatinine, Urine: 58 mg/dL (ref 20–320)
Microalb Creat Ratio: 5 mcg/mg creat (ref <30)
Microalb, Ur: 0.3 mg/dL

## 2018-04-17 LAB — LIPID PANEL
Cholesterol: 142 mg/dL (ref <200)
HDL: 54 mg/dL (ref >=40)
LDL Cholesterol (Calc): 71 mg/dL (calc)
Non-HDL Cholesterol (Calc): 88 mg/dL (calc) (ref <130)
Total CHOL/HDL Ratio: 2.6 (calc) (ref <5.0)
Triglycerides: 83 mg/dL (ref <150)

## 2018-04-17 LAB — VITAMIN B12: Vitamin B-12: >2000 pg/mL — ABNORMAL HIGH (ref 200–1100)

## 2018-04-17 LAB — TSH: TSH: 1.25 mIU/L (ref 0.40–4.50)

## 2018-04-17 LAB — CBC WITH DIFFERENTIAL/PLATELET
ABSOLUTE MONOCYTES: 811 {cells}/uL (ref 200–950)
Basophils Absolute: 18 cells/uL (ref 0–200)
Basophils Relative: 0.3 %
Eosinophils Absolute: 189 cells/uL (ref 15–500)
Eosinophils Relative: 3.1 %
HCT: 42.3 % (ref 38.5–50.0)
Hemoglobin: 15 g/dL (ref 13.2–17.1)
LYMPHS ABS: 1818 {cells}/uL (ref 850–3900)
MCH: 34.5 pg — ABNORMAL HIGH (ref 27.0–33.0)
MCHC: 35.5 g/dL (ref 32.0–36.0)
MCV: 97.2 fL (ref 80.0–100.0)
MPV: 10.5 fL (ref 7.5–12.5)
Monocytes Relative: 13.3 %
NEUTROS PCT: 53.5 %
Neutro Abs: 3264 cells/uL (ref 1500–7800)
Platelets: 218 10*3/uL (ref 140–400)
RBC: 4.35 10*6/uL (ref 4.20–5.80)
RDW: 12 % (ref 11.0–15.0)
Total Lymphocyte: 29.8 %
WBC: 6.1 10*3/uL (ref 3.8–10.8)

## 2018-04-17 LAB — URINALYSIS, ROUTINE W REFLEX MICROSCOPIC
BILIRUBIN URINE: NEGATIVE
Glucose, UA: NEGATIVE
Hgb urine dipstick: NEGATIVE
Ketones, ur: NEGATIVE
Leukocytes,Ua: NEGATIVE
Nitrite: NEGATIVE
PROTEIN: NEGATIVE
SPECIFIC GRAVITY, URINE: 1.008 (ref 1.001–1.03)
pH: 6.5 (ref 5.0–8.0)

## 2018-04-17 LAB — HEMOGLOBIN A1C
EAG (MMOL/L): 5.7 (calc)
Hgb A1c MFr Bld: 5.2 % of total Hgb (ref <5.7)
Mean Plasma Glucose: 103 (calc)

## 2018-04-17 LAB — VITAMIN D 25 HYDROXY (VIT D DEFICIENCY, FRACTURES): Vit D, 25-Hydroxy: 105 ng/mL — ABNORMAL HIGH (ref 30–100)

## 2018-04-17 LAB — INSULIN, RANDOM: Insulin: 2.8 u[IU]/mL

## 2018-04-17 LAB — TESTOSTERONE: TESTOSTERONE: 1128 ng/dL — AB (ref 250–827)

## 2018-04-17 LAB — IRON, TOTAL/TOTAL IRON BINDING CAP
%SAT: 60 % (calc) — ABNORMAL HIGH (ref 20–48)
IRON: 176 ug/dL (ref 50–180)
TIBC: 291 mcg/dL (calc) (ref 250–425)

## 2018-04-17 LAB — MAGNESIUM: Magnesium: 1.6 mg/dL (ref 1.5–2.5)

## 2018-04-18 ENCOUNTER — Other Ambulatory Visit: Payer: Self-pay | Admitting: Internal Medicine

## 2018-04-18 ENCOUNTER — Encounter (HOSPITAL_COMMUNITY)
Admission: RE | Admit: 2018-04-18 | Discharge: 2018-04-18 | Disposition: A | Discharge status: Home / Self Care | Payer: Self-pay | Source: Ambulatory Visit | Attending: Cardiovascular Disease | Admitting: Cardiovascular Disease

## 2018-04-18 ENCOUNTER — Encounter (HOSPITAL_COMMUNITY): Payer: Self-pay

## 2018-04-18 DIAGNOSIS — F419 Anxiety disorder, unspecified: Secondary | ICD-10-CM

## 2018-04-18 MED ORDER — ALPRAZOLAM 1 MG PO TABS: ORAL_TABLET | ORAL | 0 refills | Status: DC

## 2018-04-21 ENCOUNTER — Encounter (HOSPITAL_COMMUNITY): Payer: Self-pay

## 2018-04-21 DIAGNOSIS — I2510 Atherosclerotic heart disease of native coronary artery without angina pectoris: Secondary | ICD-10-CM | POA: Insufficient documentation

## 2018-04-23 ENCOUNTER — Encounter (HOSPITAL_COMMUNITY): Payer: Self-pay

## 2018-04-23 ENCOUNTER — Encounter (HOSPITAL_COMMUNITY)
Admission: RE | Admit: 2018-04-23 | Discharge: 2018-04-23 | Disposition: A | Discharge status: Home / Self Care | Payer: Self-pay | Source: Ambulatory Visit | Attending: Cardiovascular Disease | Admitting: Cardiovascular Disease

## 2018-04-24 ENCOUNTER — Other Ambulatory Visit: Payer: Self-pay | Admitting: Internal Medicine

## 2018-04-25 ENCOUNTER — Encounter (HOSPITAL_COMMUNITY): Payer: Self-pay

## 2018-04-25 ENCOUNTER — Other Ambulatory Visit: Payer: Self-pay | Admitting: Internal Medicine

## 2018-04-27 ENCOUNTER — Other Ambulatory Visit: Payer: Self-pay | Admitting: Internal Medicine

## 2018-04-27 MED ORDER — ATORVASTATIN CALCIUM 40 MG PO TABS: ORAL_TABLET | ORAL | 3 refills | Status: DC

## 2018-04-28 ENCOUNTER — Encounter (HOSPITAL_COMMUNITY): Payer: Self-pay

## 2018-04-28 ENCOUNTER — Encounter (HOSPITAL_COMMUNITY)
Admission: RE | Admit: 2018-04-28 | Discharge: 2018-04-28 | Disposition: A | Discharge status: Home / Self Care | Payer: Self-pay | Source: Ambulatory Visit | Attending: Cardiovascular Disease | Admitting: Cardiovascular Disease

## 2018-04-30 ENCOUNTER — Other Ambulatory Visit: Payer: Self-pay

## 2018-04-30 ENCOUNTER — Encounter (HOSPITAL_COMMUNITY): Payer: Self-pay

## 2018-04-30 ENCOUNTER — Encounter (HOSPITAL_COMMUNITY)
Admission: RE | Admit: 2018-04-30 | Discharge: 2018-04-30 | Disposition: A | Discharge status: Home / Self Care | Payer: Self-pay | Source: Ambulatory Visit | Attending: Cardiovascular Disease | Admitting: Cardiovascular Disease

## 2018-05-02 ENCOUNTER — Encounter (HOSPITAL_COMMUNITY): Payer: Self-pay

## 2018-05-02 ENCOUNTER — Encounter (HOSPITAL_COMMUNITY): Admission: RE | Admit: 2018-05-02 | Payer: Self-pay | Source: Ambulatory Visit

## 2018-05-05 ENCOUNTER — Encounter (HOSPITAL_COMMUNITY): Payer: Self-pay

## 2018-05-05 ENCOUNTER — Telehealth (HOSPITAL_COMMUNITY): Payer: Self-pay | Admitting: Cardiac Rehabilitation

## 2018-05-05 ENCOUNTER — Other Ambulatory Visit: Payer: Self-pay | Admitting: Internal Medicine

## 2018-05-05 MED ORDER — CYCLOBENZAPRINE HCL 10 MG PO TABS: ORAL_TABLET | ORAL | 2 refills | Status: DC

## 2018-05-05 NOTE — Telephone Encounter (Signed)
Phone call to patient to notify of [facility] Phase II departmental closing for 2 weeks.  Pt verbalized understanding.  Joann Rion, RN, BSN Cardiac Pulmonary Rehab  

## 2018-05-06 ENCOUNTER — Ambulatory Visit: Payer: 59 | Admitting: Internal Medicine

## 2018-05-06 ENCOUNTER — Other Ambulatory Visit: Payer: Self-pay

## 2018-05-06 VITALS — BP 110/76 | HR 60 | Temp 97.4°F | Resp 16 | Ht 67.5 in | Wt 200.8 lb

## 2018-05-06 DIAGNOSIS — G8929 Other chronic pain: Secondary | ICD-10-CM

## 2018-05-06 DIAGNOSIS — M533 Sacrococcygeal disorders, not elsewhere classified: Secondary | ICD-10-CM | POA: Diagnosis not present

## 2018-05-06 NOTE — Patient Instructions (Signed)
Sacroiliac Joint Dysfunction  Sacroiliac joint dysfunction is a condition that causes inflammation on one or both sides of the sacroiliac (SI) joint. The SI joint connects the lower part of the spine (sacrum) with the two upper portions of the pelvis (ilium). This condition causes deep aching or burning pain in the low back. In some cases, the pain may also spread into one or both buttocks, hips, or thighs. What are the causes? This condition may be caused by:  Pregnancy. During pregnancy, extra stress is put on the SI joints because the pelvis widens.  Injury, such as: ? Injuries from car accidents. ? Sports-related injuries. ? Work-related injuries.  Having one leg that is shorter than the other.  Conditions that affect the joints, such as: ? Rheumatoid arthritis. ? Gout. ? Psoriatic arthritis. ? Joint infection (septic arthritis). Sometimes, the cause of SI joint dysfunction is not known. What are the signs or symptoms? Symptoms of this condition include:  Aching or burning pain in the lower back. The pain may also spread to other areas, such as: ? Buttocks. ? Groin. ? Thighs.  Muscle spasms in or around the painful areas.  Increased pain when standing, walking, running, stair climbing, bending, or lifting. How is this diagnosed? This condition is diagnosed with a physical exam and medical history. During the exam, the health care provider may move one or both of your legs to different positions to check for pain. Various tests may be done to confirm the diagnosis, including:  Imaging tests to look for other causes of pain. These may include: ? MRI. ? CT scan. ? Bone scan.  Diagnostic injection. A numbing medicine is injected into the SI joint using a needle. If your pain is temporarily improved or stopped after the injection, this can indicate that SI joint dysfunction is the problem. How is this treated? Treatment depends on the cause and severity of your condition.  Treatment options may include:  Ice or heat applied to the lower back area after an injury. This may help reduce pain and muscle spasms.  Medicines to relieve pain or inflammation or to relax the muscles.  Wearing a back brace (sacroiliac brace) to help support the joint while your back is healing.  Physical therapy to increase muscle strength around the joint and flexibility at the joint. This may also involve learning proper body positions and ways of moving to relieve stress on the joint.  Direct manipulation of the SI joint.  Injections of steroid medicine into the joint to reduce pain and swelling.  Radiofrequency ablation to burn away nerves that are carrying pain messages from the joint.  Use of a device that provides electrical stimulation to help reduce pain at the joint.  Surgery to put in screws and plates that limit or prevent joint motion. This is rare. Follow these instructions at home: Medicines  Take over-the-counter and prescription medicines only as told by your health care provider.  Do not drive or use heavy machinery while taking prescription pain medicine.  If you are taking prescription pain medicine, take actions to prevent or treat constipation. Your health care provider may recommend that you: ? Drink enough fluid to keep your urine pale yellow. ? Eat foods that are high in fiber, such as fresh fruits and vegetables, whole grains, and beans. ? Limit foods that are high in fat and processed sugars, such as fried or sweet foods. ? Take an over-the-counter or prescription medicine for constipation. If you have a brace:  Wear the brace as told by your health care provider. Remove it only as told by your health care provider.  Keep the brace clean.  If the brace is not waterproof: ? Do not let it get wet. ? Cover it with a watertight covering when you take a bath or a shower. Managing pain, stiffness, and swelling      Icing can help with pain and  swelling. Heat may help with muscle tension or spasms. Ask your health care provider if you should use ice or heat.  If directed, put ice on the affected area: ? If you have a removable brace, remove it as told by your health care provider. ? Put ice in a plastic bag. ? Place a towel between your skin and the bag. ? Leave the ice on for 20 minutes, 2-3 times a day.  If directed, apply heat to the affected area. Use the heat source that your health care provider recommends, such as a moist heat pack or a heating pad. ? Place a towel between your skin and the heat source. ? Leave the heat on for 20-30 minutes. ? Remove the heat if your skin turns bright red. This is especially important if you are unable to feel pain, heat, or cold. You may have a greater risk of getting burned. General instructions  Rest as needed. Ask your health care provider what activities are safe for you.  Return to your normal activities as told by your health care provider.  Exercise as directed by your health care provider or physical therapist.  Do not use any products that contain nicotine or tobacco, such as cigarettes and e-cigarettes. These can delay bone healing. If you need help quitting, ask your health care provider.  Keep all follow-up visits as told by your health care provider. This is important. Contact a health care provider if:  Your pain is not controlled with medicine.  You have a fever.  Your pain is getting worse. Get help right away if:  You have weakness, numbness, or tingling in your legs or feet.  You lose control of your bladder or bowel. Summary  Sacroiliac joint dysfunction is a condition that causes inflammation on one or both sides of the sacroiliac (SI) joint.  This condition causes deep aching or burning pain in the low back. In some cases, the pain may also spread into one or both buttocks, hips, or thighs.  Treatment depends on the cause and severity of your condition.  It may include medicines to reduce pain and swelling or to relax muscles. +++++++++++++++++++++++++++++++++++++  Sacroiliac Joint Injection, Care After This sheet gives you information about how to care for yourself after your procedure. Your health care provider may also give you more specific instructions. If you have problems or questions, contact your health care provider. What can I expect after the procedure? After the procedure, it is common to have bruising or soreness at the injection site. Follow these instructions at home: Managing pain and swelling      Keep a record of your pain (pain log) to share with your health care provider at your follow-up visit. If a long-acting anti-inflammatory medicine (steroid) was included in your injection, you may not notice an improvement in your pain level for a few days.  Take over-the-counter and prescription medicines only as told by your health care provider.  Do not use a heating pad and do not apply heat directly to the area after the procedure.  Ask your  health care provider about using cold therapy.  If directed, put ice on the affected area. ? Put ice in a plastic bag. ? Place a towel between your skin and the bag. ? Leave the ice on for 20 minutes, 2-3 times a day. Do not use cold therapy for more than 24 hours.  Check your injection site every day for signs of infection. Check for: ? Redness, swelling, or pain. ? Fluid or blood. ? Warmth. ? Pus or a bad smell. Activity  Rest the day of your injection. Avoid doing a lot of activity on the day of the procedure.  Avoid any activities that take a lot of effort for 24 hours after the injection.  Return to your normal activities as told by your health care provider. Ask your health care provider what activities are safe for you.  Do physical therapy exercises as told by your health care provider or physical therapist. These exercises are used to strengthen muscles surrounding  the SI joint, and that will help to relieve pain. General instructions  If dye was used during your procedure, drink plenty of water to flush the dye out of your body.  Do not take baths, swim, or use a hot tub until your health care provider approves. You may take showers.  Do not drive for 24 hours if you were given a medicine to help you relax (sedative) during your procedure.  Do not use any products that contain nicotine or tobacco, such as cigarettes and e-cigarettes. If you need help quitting, ask your health care provider.  Keep all follow-up visits as told by your health care provider. This is important. Contact a health care provider if:  Your pain does not improve or it gets worse.  You have numbness, tingling, or weakness.  You have a fever.  You have redness, swelling, or pain around your injection site.  You have fluid or blood coming from your injection site.  Your injection site feels warm to the touch.  You have pus or a bad smell coming from your injection site. Get help right away if:  You have chest pain or shortness of breath. Summary  After the procedure, it is common to have bruising or soreness at the injection site.  Keep a record of your pain (pain log) to share with your health care provider at your follow-up visit.  Return to your normal activities as told by your health care provider. Ask your health care provider what activities are safe for you.  Contact your health care provider if you have pain that is getting worse, weakness, numbness, or any sign of infection at your injection site. This information is not intended to replace advice given to you by your health care provider. Make sure you discuss any questions you have with your health care provider. Document Released: 11/12/2016 Document Revised: 11/12/2016 Document Reviewed: 11/12/2016 Elsevier Interactive Patient Education  2019 [employer].

## 2018-05-07 ENCOUNTER — Encounter (HOSPITAL_COMMUNITY): Payer: Self-pay

## 2018-05-09 ENCOUNTER — Encounter (HOSPITAL_COMMUNITY): Payer: Self-pay

## 2018-05-10 ENCOUNTER — Encounter: Payer: Self-pay | Admitting: Internal Medicine

## 2018-05-10 NOTE — Progress Notes (Signed)
[facility]   Unk Pinto, M.D.    Uvaldo Bristle. Silverio Lay, P.A.-C      Liane Comber, [place]                [address] [place], [place] [address] Telephone [phone] Telefax [phone] _________________________________  Subjective:  Patient ID: Marvin Carter, male    DOB: 01/23/1956  Age: 63 y.o. MRN: [id number]  CC:  Chief Complaint  Patient presents with  . Back Pain    back pain getting worse    HPI Lamere Lightner presents for 2-3 month hx/o low back pain x months "liike it hurts in my butt cheeks". Denies sciatica type sx's.No bladder issues or footdrop.  Past Medical History:  Diagnosis Date  . Anxiety   . Arthritis   . Atrial flutter, paroxysmal ([place]) 01/04/2011  . CAD (coronary artery disease), native coronary artery 01/04/2011  . Diverticulitis   . Diverticulosis   . GERD (gastroesophageal reflux disease)   . Hemarthrosis of knee, right 01/04/2011  . Hemorrhoids   . History of meniscectomy of right knee 01/02/2011  . Hyperlipemia 01/04/2011  . Hyperlipidemia   . Hypertension   . Meniscus tear 01/02/2011  . NSVT (nonsustained ventricular tachycardia) ([place]) 01/02/2011  . Primary osteoarthritis of right knee 04/03/2017  . Right knee pain 01/02/2011  . Stented coronary artery 01/04/2011   LAD DES Resolute.  . Torn medial meniscus    right knee    Past Surgical History:  Procedure Laterality Date  . APPENDECTOMY    . COLONOSCOPY    . CORONARY ANGIOPLASTY WITH STENT PLACEMENT  2012  . HAND SURGERY     right  . KNEE ARTHROSCOPY     x 8 left  . KNEE ARTHROSCOPY Right 04/21/2012   Procedure: ARTHROSCOPY KNEE;  Surgeon: Lorn Junes, MD;  Location: [facility];  Service: Orthopedics;  Laterality: Right;  medial and lateral meniscectomies  . LEFT HEART CATHETERIZATION WITH CORONARY ANGIOGRAM N/A 01/03/2011   Procedure: LEFT HEART CATHETERIZATION WITH CORONARY  ANGIOGRAM;  Surgeon: Lorretta Harp, MD;  Location: [facility] CATH LAB;  Service: Cardiovascular;  Laterality: N/A;  . PERCUTANEOUS CORONARY STENT INTERVENTION (PCI-S) N/A 01/03/2011   Procedure: PERCUTANEOUS CORONARY STENT INTERVENTION (PCI-S);  Surgeon: Lorretta Harp, MD;  Location: [facility] CATH LAB;  Service: Cardiovascular;  Laterality: N/A;  . ROTATOR CUFF REPAIR     bilateral  . TONSILLECTOMY    . TOTAL KNEE ARTHROPLASTY Right 04/15/2017  . TOTAL KNEE ARTHROPLASTY Right 04/15/2017   Procedure: TOTAL KNEE ARTHROPLASTY;  Surgeon: Elsie Saas, MD;  Location: [facility];  Service: Orthopedics;  Laterality: Right;  . TRICEPS TENDON REPAIR Bilateral     Family History  Problem Relation Age of Onset  . Diabetes Mother   . Heart disease Mother   . Heart attack Mother   . Heart disease Father   . Coronary artery disease Father   . Obesity Brother   . Diabetes Brother   . HIV/AIDS Brother   . Hypertension Brother    Social History   Socioeconomic History  . Marital status: Married    Spouse name: Not on file  . Number of children: 2  . Years of education: Not on file  . Highest education level: Not on file  Occupational History  . Occupation: Self Employed    Comment: Theatre manager  Employer: [employer]  Tobacco Use  . Smoking status: Never Smoker  . Smokeless tobacco: Never Used  Substance and Sexual Activity  . Alcohol use: Yes    Alcohol/week: 10.0 standard drinks    Types: 10 Cans of beer per week    Comment: 1-2 beers a night  . Drug use: No  . Sexual activity: Active    Medication Sig  . acetaminophen  325 MG tablet Take 2 tablets  every 4  hours as needed for mild pain   . ALPRAZolam  1 MG tablet Take 1/2-1 tablet 2 - 3 x /day ONLY if needed   . Ascorbic Acid (VITAMIN C PO) Take 1 tablet by mouth daily as needed (for immune system support).  . ASPIRIN 81 PO Take 81 mg by mouth daily.   Marland Kitchen atenolol (TENORMIN) 100 MG tablet Take 1 tablet daily for BP  .  atorvastatin  40 MG tablet Take 1 tablet daily for Cholesterol  . buPROPion  XL 300 MG 24 hr tablet TAKE 1 TABLET BY MOUTH DAILY FOR MOOD  . clopidogrel 75 MG tablet TAKE 1 TABLET(75 MG) BY MOUTH DAILY  . cyclobenzaprine 10 MG tablet Take 1/2 to 1 tablet 3 x /day as needed for Muscle Spasm  . Desoximetasone  0.25 % oint Apply 4 x /day to irritated rash  . diclofenac  1 % GEL Apply 2 to 4 grams 2 to 4 x / day  . Diltiazem XT 240 MG 24 hr capsule TAKE 1 CAPSULE  EVERY DAY  . doxazosin  8 MG tablet TAKE 1/2 TO 1 TABLET  DAILY   . famotidine  40 MG tablet Take 1 tablet daily for acid indigestion & reflux  . furosemide  80 MG tablet Take 1 tablet daily for BP & Fluid Retention  . meloxicam  15 MG tablet Take 1/2 to 1 tablet daily   . MultiVit w/Minerals Take 2 tablets by mouth daily. Mega Men Multivitamin.  Marland Kitchen NITROSTAT 0.4 MG SL  AS NEEDED FOR CHEST PAIN  . olmesartan  40 MG tablet Patient taking differently: 20 mg.   . pantoprazole  40 MG tablet Take 1 tablet (40 mg total) by mouth daily as needed.  . testosterone cypio 200 MG/ML injec Inject 2 ml IM every 2 weeks  . finasteride  5 MG tablet Take 1 tablet daily for prostate   Allergies  Allergen Reactions  . Hctz [Hydrochlorothiazide]   . Tape Rash    Clear tape   Review of Systems                    10 point systems review negative except as above.   Objective:    Physical Exam  BP 110/76   Pulse 60   Temp (!) 97.4 F (36.3 C)   Resp 16   Ht 5' 7.5" (1.715 m)   Wt 200 lb 12.8 oz (91.1 kg)   BMI 30.99 kg/m  Wt Readings from Last 3 Encounters:  05/06/18 200 lb 12.8 oz (91.1 kg)  04/16/18 201 lb 3.2 oz (91.3 kg)  01/13/18 199 lb 12.8 oz (90.6 kg)    BP 110/76   Pulse 60   Temp (!) 97.4 F (36.3 C)   Resp 16   Ht 5' 7.5" (1.715 m)   Wt 200 lb 12.8 oz (91.1 kg)   BMI 30.99 kg/m   In No Distress  HEENT - Eac's patent. TM's Nl. EOM's full. PERRLA. NasoOroPharynx clear. Neck -  supple. Nl Thyroid. Carotids 2+ & No  bruits, nodes, JVD Chest - Clear equal BS w/o rales, rhonchi, wheezes. Cor - Nl HS. RRR w/o sig MGR. PP 1(+). No edema. Abd - No palpable organomegaly, masses or tenderness. BS nl. MS- FROM w/o deformities. (+) tender trigger points  bilat SI joints.  Muscle power, tone and bulk Nl. Gait Nl. Neuro - No obvious Cr N abnormalities. Sensory, motor and Cerebellar functions appear Nl w/o focal abnormalities. Psyche - Mental status normal & appropriate.  No delusions, ideations or obvious mood abnormalities. Skin - exposed clear w/o rash cyanosis or icterus.  Procedure  (CPT [phone] x 2)     After informed consent & aseptic prep with alcohol, both Right & Left SI joints were injected at trigger points with 1 ml Dexamethasone (10 mg) and 1 ml Lidocaine 1%    Assessment & Plan:       Visit Diagnoses   1 Chronic left SI joint pain    2 Chronic right SI joint pain         Kirtland Bouchard, MD

## 2018-05-12 ENCOUNTER — Encounter (HOSPITAL_COMMUNITY): Payer: Self-pay

## 2018-05-13 ENCOUNTER — Telehealth (HOSPITAL_COMMUNITY): Payer: Self-pay | Admitting: Internal Medicine

## 2018-05-14 ENCOUNTER — Encounter (HOSPITAL_COMMUNITY): Payer: Self-pay

## 2018-05-16 ENCOUNTER — Encounter (HOSPITAL_COMMUNITY): Payer: Self-pay

## 2018-05-16 ENCOUNTER — Other Ambulatory Visit: Payer: 59 | Admitting: Internal Medicine

## 2018-05-16 DIAGNOSIS — M533 Sacrococcygeal disorders, not elsewhere classified: Secondary | ICD-10-CM

## 2018-05-16 DIAGNOSIS — G8929 Other chronic pain: Secondary | ICD-10-CM | POA: Diagnosis not present

## 2018-05-16 MED ORDER — GABAPENTIN 100 MG PO CAPS: ORAL_CAPSULE | ORAL | 0 refills | Status: DC

## 2018-05-16 NOTE — Progress Notes (Signed)
    Patient is a nice 63 yo MWM with hx/o multiple orthopedic surgeries who had trigger point injections at bilat SI joints 10 days ago on 05/06/2018 and reports no significant improvement. He has CKD3 and can only take NSAID's very sparingly & infrequently.    To avoid Opioids, will send in empiric trial on Gabapentin to try to titrate to control pain.  1. Chronic left SI joint pain - gabapentin (NEURONTIN) 100 MG capsule; Take 1 to 3 capsules 2 to 3 x /day as needed for pain  Dispense: 90 capsule; Refill: 0  2. Chronic right SI joint pain - gabapentin (NEURONTIN) 100 MG capsule; Take 1 to 3 capsules 2 to 3 x /day as needed for pain  Dispense: 90 capsule; Refill: 0  Over 12 minutes of chart review, critical decision making and chart documentation was performed.

## 2018-05-19 ENCOUNTER — Encounter (HOSPITAL_COMMUNITY): Payer: Self-pay

## 2018-05-21 ENCOUNTER — Encounter (HOSPITAL_COMMUNITY): Payer: Self-pay

## 2018-05-23 ENCOUNTER — Encounter (HOSPITAL_COMMUNITY): Payer: Self-pay

## 2018-05-23 ENCOUNTER — Other Ambulatory Visit: Payer: Self-pay | Admitting: Internal Medicine

## 2018-05-23 DIAGNOSIS — E349 Endocrine disorder, unspecified: Secondary | ICD-10-CM

## 2018-05-23 DIAGNOSIS — I10 Essential (primary) hypertension: Secondary | ICD-10-CM

## 2018-05-23 MED ORDER — DOXAZOSIN MESYLATE 8 MG PO TABS: ORAL_TABLET | ORAL | 3 refills | Status: DC

## 2018-05-23 MED ORDER — TESTOSTERONE CYPIONATE 200 MG/ML IM SOLN: INTRAMUSCULAR | 3 refills | Status: DC

## 2018-05-26 ENCOUNTER — Encounter (HOSPITAL_COMMUNITY): Payer: Self-pay

## 2018-05-28 ENCOUNTER — Encounter (HOSPITAL_COMMUNITY): Payer: Self-pay

## 2018-05-30 ENCOUNTER — Telehealth (HOSPITAL_COMMUNITY): Payer: Self-pay | Admitting: *Deleted

## 2018-05-30 ENCOUNTER — Encounter (HOSPITAL_COMMUNITY): Payer: Self-pay

## 2018-05-30 NOTE — Telephone Encounter (Signed)
Called to notify patient that the cardiac and pulmonary rehabilitation department remains closed at this time due to COVID-19 restrictions. Pt verbalized understanding.  Sol Passer, [place], ACSM CEP 05/30/2018 [phone]

## 2018-06-02 ENCOUNTER — Encounter (HOSPITAL_COMMUNITY): Payer: Self-pay

## 2018-06-04 ENCOUNTER — Encounter (HOSPITAL_COMMUNITY): Payer: Self-pay

## 2018-06-04 ENCOUNTER — Other Ambulatory Visit: Payer: Self-pay | Admitting: Internal Medicine

## 2018-06-04 DIAGNOSIS — G8929 Other chronic pain: Secondary | ICD-10-CM

## 2018-06-04 DIAGNOSIS — M545 Low back pain: Principal | ICD-10-CM

## 2018-06-06 ENCOUNTER — Encounter (HOSPITAL_COMMUNITY): Payer: Self-pay

## 2018-06-06 DIAGNOSIS — M545 Low back pain: Secondary | ICD-10-CM | POA: Diagnosis not present

## 2018-06-09 ENCOUNTER — Encounter (HOSPITAL_COMMUNITY): Payer: Self-pay

## 2018-06-11 ENCOUNTER — Encounter (HOSPITAL_COMMUNITY): Payer: Self-pay

## 2018-06-13 ENCOUNTER — Encounter (HOSPITAL_COMMUNITY): Payer: Self-pay

## 2018-06-16 ENCOUNTER — Encounter (HOSPITAL_COMMUNITY): Payer: Self-pay

## 2018-06-16 DIAGNOSIS — M545 Low back pain: Secondary | ICD-10-CM | POA: Diagnosis not present

## 2018-06-18 ENCOUNTER — Other Ambulatory Visit: Payer: Self-pay | Admitting: Internal Medicine

## 2018-06-18 ENCOUNTER — Encounter (HOSPITAL_COMMUNITY): Payer: Self-pay

## 2018-06-18 DIAGNOSIS — F419 Anxiety disorder, unspecified: Secondary | ICD-10-CM

## 2018-06-18 MED ORDER — ALPRAZOLAM 1 MG PO TABS: ORAL_TABLET | ORAL | 0 refills | Status: DC

## 2018-06-18 MED ORDER — ESCITALOPRAM OXALATE 20 MG PO TABS: ORAL_TABLET | ORAL | 1 refills | Status: DC

## 2018-06-30 DIAGNOSIS — M48061 Spinal stenosis, lumbar region without neurogenic claudication: Secondary | ICD-10-CM | POA: Diagnosis not present

## 2018-06-30 DIAGNOSIS — M545 Low back pain: Secondary | ICD-10-CM | POA: Diagnosis not present

## 2018-07-19 ENCOUNTER — Other Ambulatory Visit: Payer: Self-pay | Admitting: Cardiovascular Disease

## 2018-07-23 ENCOUNTER — Other Ambulatory Visit: Payer: Self-pay | Admitting: Adult Health

## 2018-07-23 ENCOUNTER — Other Ambulatory Visit: Payer: Self-pay | Admitting: Internal Medicine

## 2018-07-23 DIAGNOSIS — K219 Gastro-esophageal reflux disease without esophagitis: Secondary | ICD-10-CM

## 2018-07-23 MED ORDER — PANTOPRAZOLE SODIUM 40 MG PO TBEC: DELAYED_RELEASE_TABLET | ORAL | 3 refills | Status: DC

## 2018-07-25 ENCOUNTER — Encounter: Payer: Self-pay | Admitting: Adult Health

## 2018-07-25 NOTE — Progress Notes (Signed)
FOLLOW UP  Assessment and Plan:   CAD Followed by cardiology, stopped cardiac rehab due to covid Control blood pressure, cholesterol, glucose, increase exercise.   Hypertension Well controlled with current medications  Monitor blood pressure at home; patient to call if consistently greater than 130/80 Continue DASH diet.   Reminder to go to the ER if any CP, SOB, nausea, dizziness, severe HA, changes vision/speech, left arm numbness and tingling and jaw pain.  Cholesterol Currently at goal; continue statin therapy; stopped fenofibrate after reducing wine intake Continue low cholesterol diet and exercise.  Check lipid panel.   Other abnormal glucose Recent A1Cs at goal Discussed diet/exercise, weight management  Defer A1C; check CMP  Obesity with co morbidities Long discussion about weight loss, diet, and exercise Recommended diet heavy in fruits and veggies and low in animal meats, cheeses, and dairy products, appropriate calorie intake Discussed ideal weight for height  Will follow up in 3 months  Vitamin D Def Above goal at last visit; continue supplementation to maintain goal of 70-100 Check Vit D level  Hypogonadism - continue replacement therapy, check testosterone levels as needed. Defer today as just had shot 2 days ago.   GERD Well managed on current medications  Discussed diet, avoiding triggers and other lifestyle changes  Insomnia Patient is on Benzos, have attempted taper and has trialed trazodone, gabapentin, benadryl apparently with very little success; discussed he should continue to limit benzo use to <5 days/week to limit tolerance/additions; NO DOSAGE INCREASE good sleep hygiene discussed, increase day time activity   Continue diet and meds as discussed. Further disposition pending results of labs. Discussed med's effects and SE's.   Over 30 minutes of exam, counseling, chart review, and critical decision making was performed.   Future Appointments   Date Time Provider [place]  10/29/2018  9:30 AM Unk Pinto, MD [facility] None  05/08/2019 10:00 AM Unk Pinto, MD [facility] None    ----------------------------------------------------------------------------------------------------------------------  HPI 63 y.o. male  presents for 3 month follow up on hypertension, cholesterol, glucose management, anxiety, weight and vitamin D deficiency. Patient had a negative heart cath in 2009 and then presented in 2012 with ACS undergoing PCA/DES. Patient continues out-patient cardiac rehab 3 x/week.     He is following with [facility] for spinal stenosis, getting injections and will be doing therapy.   he has a diagnosis of anxiety and is currently on lexapro 20 mg and xanax 1mg  daily PRN, reports symptoms are well controlled on current regimen. he reports he typically takes this medication at night to assist with sleep. He is on wellbutrin 300 mg during the day for mood and focus.Trazodone and gabapentin have been prescribed at night for sleep but reports didn't help.   he has a diagnosis of GERD which is currently managed by protonix, famotidine - he alternates between the two to avoid taking protonix daily.   he reports symptoms is currently well controlled, and denies breakthrough reflux, burning in chest, hoarseness or cough.    BMI is Body mass index is 31.26 kg/m., he has been working on diet and exercise.  Wt Readings from Last 3 Encounters:  07/28/18 202 lb 9.6 oz (91.9 kg)  05/06/18 200 lb 12.8 oz (91.1 kg)  04/16/18 201 lb 3.2 oz (91.3 kg)   Patient had a negative heart cath in 2009, and then in 2012  Resented with ACS and had PCA/DES implanted. History of CAD status post mid LAD FFR guided stenting with a resolute drug-eluting stents by Dr.  Gwenlyn Found who continues to follow  He has had episodes of a. Flutter; he is on cardizem, plavix and olmesartan  His blood pressure has been controlled at home, today their BP is  BP: 112/72  He does workout. He denies chest pain, shortness of breath, dizziness.   He is on cholesterol medication (atorvastatin 40 mg daily) and denies myalgias. His cholesterol is at goal. The cholesterol last visit was:   Lab Results  Component Value Date   CHOL 142 04/16/2018   HDL 54 04/16/2018   LDLCALC 71 04/16/2018   TRIG 83 04/16/2018   CHOLHDL 2.6 04/16/2018    He has been working on diet and exercise for glucose management, and denies foot ulcerations, increased appetite, nausea, paresthesia of the feet, polydipsia, polyuria, visual disturbances, vomiting and weight loss. Last A1C in the office was:  Lab Results  Component Value Date   HGBA1C 5.2 04/16/2018   Patient is on Vitamin D supplement.   Lab Results  Component Value Date   VD25OH 105 (H) 04/16/2018     He has a history of testosterone deficiency and is on testosterone replacement, last injection was 2 days ago. He states that the testosterone helps with his energy, libido, muscle mass. Lab Results  Component Value Date   TESTOSTERONE 1,128 (H) 04/16/2018    Current Medications:  Current Outpatient Medications on File Prior to Visit  Medication Sig  . acetaminophen (TYLENOL) 325 MG tablet Take 2 tablets (650 mg total) by mouth every 4 (four) hours as needed for mild pain ((score 1 to 3) or temp > 100.5).  Marland Kitchen ALPRAZolam (XANAX) 1 MG tablet Take 1/2-1 tablet 2 - 3 x /day ONLY if needed for Anxiety Attack &  limit to 5 days /week to avoid addiction  . Ascorbic Acid (VITAMIN C PO) Take 1 tablet by mouth daily as needed (for immune system support).  . ASPIRIN 81 PO Take 81 mg by mouth daily.   Marland Kitchen atorvastatin (LIPITOR) 40 MG tablet Take 1 tablet daily for Cholesterol  . buPROPion (WELLBUTRIN XL) 300 MG 24 hr tablet TAKE 1 TABLET BY MOUTH DAILY FOR MOOD  . clopidogrel (PLAVIX) 75 MG tablet TAKE 1 TABLET(75 MG) BY MOUTH DAILY  . cyclobenzaprine (FLEXERIL) 10 MG tablet Take 1/2 to 1 tablet 3 x /day as needed for  Muscle Spasm  . diclofenac sodium (VOLTAREN) 1 % GEL Apply 2 to 4 grams 2 to 4 x / day  . diltiazem (CARTIA XT) 240 MG 24 hr capsule TAKE 1 CAPSULE BY MOUTH EVERY DAY FOR BLOOD PRESSURE  . doxazosin (CARDURA) 8 MG tablet Take 1 tablet Daily for BP & Prostate  . escitalopram (LEXAPRO) 20 MG tablet Take 1 tablet Daily for Mood / Anxiety  . famotidine (PEPCID) 40 MG tablet Take 1 tablet daily for acid indigestion & reflux  . furosemide (LASIX) 80 MG tablet Take 1 tablet daily for BP & Fluid Retention  . meloxicam (MOBIC) 15 MG tablet Take 1/2 to 1 tablet daily & limit to 4-5 days /week  to avoid kidney damage  . Multiple Vitamin (MULTIVITAMIN WITH MINERALS) TABS Take 2 tablets by mouth daily. Mega Men Multivitamin.  . nitroGLYCERIN (NITROSTAT) 0.4 MG SL tablet PLACE 1 TABLET (0.4 MG TOTAL) UNDER THE TONGUE EVERY 5 (FIVE) MINUTES AS NEEDED FOR CHEST PAIN.  Marland Kitchen olmesartan (BENICAR) 40 MG tablet DO NOT TAKE THIS MEDICATION UNLESS BP IS GREATER THAN 140/90 (Patient taking differently: 20 mg. DO NOT TAKE THIS MEDICATION UNLESS BP IS  GREATER THAN 140/90)  . pantoprazole (PROTONIX) 40 MG tablet Take 1 tablet Daily for Acid Indigestion & Heart Burn  . testosterone cypionate (DEPOTESTOSTERONE CYPIONATE) 200 MG/ML injection Inject 2 ml IM every 2 weeks  . atenolol (TENORMIN) 100 MG tablet Take 1 tablet daily for BP  . Desoximetasone (TOPICORT) 0.25 % ointment Apply 4 x /day to irritated rash  . gabapentin (NEURONTIN) 100 MG capsule Take 1 to 3 capsules 2 to 3 x /day as needed for pain  . traZODone (DESYREL) 150 MG tablet Take 1/2 to 1 tablet 1 hour before Bedtime if needed for Sleep   No current facility-administered medications on file prior to visit.      Allergies:  Allergies  Allergen Reactions  . Hctz [Hydrochlorothiazide]   . Tape Rash    Clear tape     Medical History:  Past Medical History:  Diagnosis Date  . Anxiety   . Arthritis   . Atrial flutter, paroxysmal ([place]) 01/04/2011  . CAD  (coronary artery disease), native coronary artery 01/04/2011  . Diverticulitis   . Diverticulosis   . GERD (gastroesophageal reflux disease)   . Hemarthrosis of knee, right 01/04/2011  . Hemorrhoids   . History of meniscectomy of right knee 01/02/2011  . Hyperlipemia 01/04/2011  . Hyperlipidemia   . Hypertension   . Meniscus tear 01/02/2011  . NSVT (nonsustained ventricular tachycardia) ([place]) 01/02/2011  . Primary osteoarthritis of right knee 04/03/2017  . Right knee pain 01/02/2011  . Stented coronary artery 01/04/2011   LAD DES Resolute.  . Torn medial meniscus    right knee   Family history- Reviewed and unchanged Social history- Reviewed and unchanged   Review of Systems:  Review of Systems  Constitutional: Negative for malaise/fatigue and weight loss.  HENT: Negative for hearing loss and tinnitus.   Eyes: Negative for blurred vision and double vision.  Respiratory: Negative for cough, shortness of breath and wheezing.   Cardiovascular: Negative for chest pain, palpitations, orthopnea, claudication and leg swelling.  Gastrointestinal: Negative for abdominal pain, blood in stool, constipation, diarrhea, heartburn, melena, nausea and vomiting.  Genitourinary: Negative.   Musculoskeletal: Positive for back pain (spinal stenosis per ortho). Negative for joint pain and myalgias.  Skin: Negative for rash.  Neurological: Negative for dizziness, tingling, sensory change, weakness and headaches.  Endo/Heme/Allergies: Negative for polydipsia.  Psychiatric/Behavioral: Negative for depression and substance abuse. The patient has insomnia. The patient is not nervous/anxious.   All other systems reviewed and are negative.   Physical Exam: BP 112/72   Pulse 75   Temp 97.9 F (36.6 C)   Ht 5' 7.5" (1.715 m)   Wt 202 lb 9.6 oz (91.9 kg)   SpO2 99%   BMI 31.26 kg/m  Wt Readings from Last 3 Encounters:  07/28/18 202 lb 9.6 oz (91.9 kg)  05/06/18 200 lb 12.8 oz (91.1 kg)  04/16/18  201 lb 3.2 oz (91.3 kg)   General Appearance: Well nourished, in no apparent distress. Eyes: PERRLA, EOMs, conjunctiva no swelling or erythema Sinuses: No Frontal/maxillary tenderness ENT/Mouth: Ext aud canals clear, TMs without erythema, bulging. No erythema, swelling, or exudate on post pharynx.  Tonsils not swollen or erythematous. Hearing normal.  Neck: Supple, thyroid normal.  Respiratory: Respiratory effort normal, BS equal bilaterally without rales, rhonchi, wheezing or stridor.  Cardio: RRR with no MRGs. Brisk peripheral pulses without edema.  Abdomen: Soft, + BS.  Non tender, no guarding, rebound, hernias, masses. Lymphatics: Non tender without lymphadenopathy.  Musculoskeletal: Full ROM,  5/5 strength, Normal gait, lumbar non-tender, no muscle spasms, neg straight leg raise Skin: Warm, dry without rashes, lesions, ecchymosis.  Neuro: Cranial nerves intact. No cerebellar symptoms.  Psych: Awake and oriented X 3, normal affect, Insight and Judgment appropriate.    Izora Ribas, NP 9:57 AM [facility]

## 2018-07-28 ENCOUNTER — Ambulatory Visit: Payer: 59 | Admitting: Adult Health

## 2018-07-28 ENCOUNTER — Other Ambulatory Visit: Payer: Self-pay

## 2018-07-28 ENCOUNTER — Encounter: Payer: Self-pay | Admitting: Adult Health

## 2018-07-28 VITALS — BP 112/72 | HR 75 | Temp 97.9°F | Ht 67.5 in | Wt 202.6 lb

## 2018-07-28 DIAGNOSIS — E782 Mixed hyperlipidemia: Secondary | ICD-10-CM

## 2018-07-28 DIAGNOSIS — I10 Essential (primary) hypertension: Secondary | ICD-10-CM

## 2018-07-28 DIAGNOSIS — I130 Hypertensive heart and chronic kidney disease with heart failure and stage 1 through stage 4 chronic kidney disease, or unspecified chronic kidney disease: Secondary | ICD-10-CM

## 2018-07-28 DIAGNOSIS — R7309 Other abnormal glucose: Secondary | ICD-10-CM

## 2018-07-28 DIAGNOSIS — N183 Chronic kidney disease, stage 3 unspecified: Secondary | ICD-10-CM

## 2018-07-28 DIAGNOSIS — I4892 Unspecified atrial flutter: Secondary | ICD-10-CM | POA: Diagnosis not present

## 2018-07-28 DIAGNOSIS — E559 Vitamin D deficiency, unspecified: Secondary | ICD-10-CM

## 2018-07-28 DIAGNOSIS — M48061 Spinal stenosis, lumbar region without neurogenic claudication: Secondary | ICD-10-CM

## 2018-07-28 DIAGNOSIS — F419 Anxiety disorder, unspecified: Secondary | ICD-10-CM

## 2018-07-28 DIAGNOSIS — E669 Obesity, unspecified: Secondary | ICD-10-CM

## 2018-07-28 DIAGNOSIS — K219 Gastro-esophageal reflux disease without esophagitis: Secondary | ICD-10-CM | POA: Diagnosis not present

## 2018-07-28 DIAGNOSIS — Z79899 Other long term (current) drug therapy: Secondary | ICD-10-CM

## 2018-07-28 DIAGNOSIS — E349 Endocrine disorder, unspecified: Secondary | ICD-10-CM

## 2018-07-28 HISTORY — DX: Spinal stenosis, lumbar region without neurogenic claudication: M48.061

## 2018-07-28 NOTE — Patient Instructions (Addendum)
Goals    . Exercise 150 min/wk Moderate Activity    . LDL CALC < 70    . Weight (lb) < 190 lb (86.2 kg)         Spinal Stenosis  Spinal stenosis occurs when the open space (spinal canal) between the bones of your spine (vertebrae) narrows, putting pressure on the spinal cord or nerves. What are the causes? This condition is caused by areas of bone pushing into the central canals of your vertebrae. This condition may be present at birth (congenital), or it may be caused by:  Arthritic deterioration of your vertebrae (spinal degeneration). This usually starts around age 58.  Injury or trauma to the spine.  Tumors in the spine.  Calcium deposits in the spine. What are the signs or symptoms? Symptoms of this condition include:  Pain in the neck or back that is generally worse with activities, particularly when standing and walking.  Numbness, tingling, hot or cold sensations, weakness, or weariness in your legs.  Pain going up and down the leg (sciatica).  Frequent episodes of falling.  A foot-slapping gait that leads to muscle weakness. In more serious cases, you may develop:  Problemspassing stool or passing urine.  Difficulty having sex.  Loss of feeling in part or all of your leg. Symptoms may come on slowly and get worse over time. How is this diagnosed? This condition is diagnosed based on your medical history and a physical exam. Tests will also be done, such as:  MRI.  CT scan.  X-ray. How is this treated? Treatment for this condition often focuses on managing your pain and any other symptoms. Treatment may include:  Practicing good posture to lessen pressure on your nerves.  Exercising to strengthen muscles, build endurance, improve balance, and maintain good joint movement (range of motion).  Losing weight, if needed.  Taking medicines to reduce swelling, inflammation, or pain.  Assistive devices, such as a corset or brace. In some cases, surgery  may be needed. The most common procedure is decompression laminectomy. This is done to remove excess bone that puts pressure on your nerve roots. Follow these instructions at home: Managing pain, stiffness, and swelling  Do all exercises and stretches as told by your health care provider.  Practice good posture. If you were given a brace or a corset, wear it as told by your health care provider.  Do not do any activities that cause pain. Ask your health care provider what activities are safe for you.  Do not lift anything that is heavier than 10 lb (4.5 kg) or the limit that your health care provider tells you.  Maintain a healthy weight. Talk with your health care provider if you need help losing weight.  If directed, apply heat to the affected area as often as told by your health care provider. Use the heat source that your health care provider recommends, such as a moist heat pack or a heating pad. ? Place a towel between your skin and the heat source. ? Leave the heat on for 20-30 minutes. ? Remove the heat if your skin turns bright red. This is especially important if you are not able to feel pain, heat, or cold. You may have a greater risk of getting burned. General instructions  Take over-the-counter and prescription medicines only as told by your health care provider.  Do not use any products that contain nicotine or tobacco, such as cigarettes and e-cigarettes. If you need help quitting, ask your  health care provider.  Eat a healthy diet. This includes plenty of fruits and vegetables, whole grains, and low-fat (lean) protein.  Keep all follow-up visits as told by your health care provider. This is important. Contact a health care provider if:  Your symptoms do not get better or they get worse.  You have a fever. Get help right away if:  You have new or worse pain in your neck or upper back.  You have severe pain that cannot be controlled with medicines.  You are  dizzy.  You have vision problems, blurred vision, or double vision.  You have a severe headache that is worse when you stand.  You have nausea or you vomit.  You develop new or worse numbness or tingling in your back or legs.  You have pain, redness, swelling, or warmth in your arm or leg. Summary  Spinal stenosis occurs when the open space (spinal canal) between the bones of your spine (vertebrae) narrows. This narrowing puts pressure on the spinal cord or nerves.  Spinal stenosis can cause numbness, weakness, or pain in the neck, back, and legs.  This condition may be caused by a birth defect, arthritic deterioration of your vertebrae, injury, tumors, or calcium deposits.  This condition is usually diagnosed with MRIs, CT scans, and X-rays. This information is not intended to replace advice given to you by your health care provider. Make sure you discuss any questions you have with your health care provider. Document Released: 04/28/2003 Document Revised: 01/11/2016 Document Reviewed: 01/11/2016 Elsevier Interactive Patient Education  2019 [employer].

## 2018-07-29 LAB — CBC WITH DIFFERENTIAL/PLATELET
Absolute Monocytes: 754 cells/uL (ref 200–950)
Basophils Absolute: 28 cells/uL (ref 0–200)
Basophils Relative: 0.5 %
Eosinophils Absolute: 193 cells/uL (ref 15–500)
Eosinophils Relative: 3.5 %
HCT: 41.1 % (ref 38.5–50.0)
Hemoglobin: 14.1 g/dL (ref 13.2–17.1)
Lymphs Abs: 1617 cells/uL (ref 850–3900)
MCH: 33.9 pg — ABNORMAL HIGH (ref 27.0–33.0)
MCHC: 34.3 g/dL (ref 32.0–36.0)
MCV: 98.8 fL (ref 80.0–100.0)
MPV: 9.9 fL (ref 7.5–12.5)
Monocytes Relative: 13.7 %
Neutro Abs: 2910 cells/uL (ref 1500–7800)
Neutrophils Relative %: 52.9 %
Platelets: 248 10*3/uL (ref 140–400)
RBC: 4.16 10*6/uL — ABNORMAL LOW (ref 4.20–5.80)
RDW: 13.1 % (ref 11.0–15.0)
Total Lymphocyte: 29.4 %
WBC: 5.5 10*3/uL (ref 3.8–10.8)

## 2018-07-29 LAB — TSH: TSH: 0.79 mIU/L (ref 0.40–4.50)

## 2018-07-29 LAB — COMPLETE METABOLIC PANEL WITH GFR
AG Ratio: 1.8 (calc) (ref 1.0–2.5)
ALT: 38 U/L (ref 9–46)
AST: 35 U/L (ref 10–35)
Albumin: 4.2 g/dL (ref 3.6–5.1)
Alkaline phosphatase (APISO): 48 U/L (ref 35–144)
BUN/Creatinine Ratio: 19 (calc) (ref 6–22)
BUN: 24 mg/dL (ref 7–25)
CO2: 25 mmol/L (ref 20–32)
Calcium: 9.5 mg/dL (ref 8.6–10.3)
Chloride: 103 mmol/L (ref 98–110)
Creat: 1.26 mg/dL — ABNORMAL HIGH (ref 0.70–1.25)
GFR, Est African American: 70 mL/min/{1.73_m2} (ref >=60)
GFR, Est Non African American: 60 mL/min/{1.73_m2} (ref >=60)
Globulin: 2.4 g/dL (calc) (ref 1.9–3.7)
Glucose, Bld: 102 mg/dL — ABNORMAL HIGH (ref 65–99)
Potassium: 4.1 mmol/L (ref 3.5–5.3)
Sodium: 138 mmol/L (ref 135–146)
Total Bilirubin: 0.8 mg/dL (ref 0.2–1.2)
Total Protein: 6.6 g/dL (ref 6.1–8.1)

## 2018-07-29 LAB — VITAMIN D 25 HYDROXY (VIT D DEFICIENCY, FRACTURES): Vit D, 25-Hydroxy: 76 ng/mL (ref 30–100)

## 2018-07-29 LAB — LIPID PANEL
Cholesterol: 139 mg/dL (ref <200)
HDL: 53 mg/dL (ref >=40)
LDL Cholesterol (Calc): 64 mg/dL (calc)
Non-HDL Cholesterol (Calc): 86 mg/dL (calc) (ref <130)
Total CHOL/HDL Ratio: 2.6 (calc) (ref <5.0)
Triglycerides: 137 mg/dL (ref <150)

## 2018-07-29 LAB — MAGNESIUM: Magnesium: 1.6 mg/dL (ref 1.5–2.5)

## 2018-08-06 ENCOUNTER — Telehealth: Payer: Self-pay | Admitting: *Deleted

## 2018-08-06 ENCOUNTER — Other Ambulatory Visit: Payer: Self-pay | Admitting: Internal Medicine

## 2018-08-06 DIAGNOSIS — F419 Anxiety disorder, unspecified: Secondary | ICD-10-CM

## 2018-08-06 MED ORDER — ALPRAZOLAM 1 MG PO TABS: ORAL_TABLET | ORAL | 0 refills | Status: DC

## 2018-08-06 NOTE — Telephone Encounter (Signed)
Faxed form for anticoagulation clearance to [name] for upcoming epidural injection.

## 2018-08-15 ENCOUNTER — Telehealth (HOSPITAL_COMMUNITY): Payer: Self-pay

## 2018-08-15 NOTE — Telephone Encounter (Signed)
Called pt regarding cardiac rehab maintenance program. Informed pt program is still on hold at this time due to [place] pandemic. Will follow up with pt once we are able to offer program again.   Carma Lair MS, ACSM CEP 11:31 AM 08/15/2018

## 2018-08-22 ENCOUNTER — Other Ambulatory Visit: Payer: Self-pay | Admitting: Internal Medicine

## 2018-08-22 MED ORDER — CYCLOBENZAPRINE HCL 10 MG PO TABS: ORAL_TABLET | ORAL | 2 refills | Status: DC

## 2018-09-16 ENCOUNTER — Other Ambulatory Visit: Payer: Self-pay | Admitting: Internal Medicine

## 2018-09-16 MED ORDER — TAMSULOSIN HCL 0.4 MG PO CAPS: ORAL_CAPSULE | ORAL | 0 refills | Status: DC

## 2018-09-16 MED ORDER — CEPHALEXIN 500 MG PO CAPS: ORAL_CAPSULE | ORAL | 0 refills | Status: DC

## 2018-09-16 MED ORDER — PREDNISONE 20 MG PO TABS: ORAL_TABLET | ORAL | 0 refills | Status: DC

## 2018-09-21 ENCOUNTER — Other Ambulatory Visit: Payer: Self-pay | Admitting: Internal Medicine

## 2018-09-28 ENCOUNTER — Other Ambulatory Visit: Payer: Self-pay | Admitting: Internal Medicine

## 2018-09-28 DIAGNOSIS — I2510 Atherosclerotic heart disease of native coronary artery without angina pectoris: Secondary | ICD-10-CM

## 2018-09-28 DIAGNOSIS — F419 Anxiety disorder, unspecified: Secondary | ICD-10-CM

## 2018-09-28 MED ORDER — CLOPIDOGREL BISULFATE 75 MG PO TABS: ORAL_TABLET | ORAL | 3 refills | Status: DC

## 2018-09-28 MED ORDER — ALPRAZOLAM 1 MG PO TABS: ORAL_TABLET | ORAL | 0 refills | Status: DC

## 2018-10-15 ENCOUNTER — Ambulatory Visit: Payer: 59 | Admitting: Physician Assistant

## 2018-10-15 ENCOUNTER — Other Ambulatory Visit: Payer: Self-pay

## 2018-10-15 ENCOUNTER — Encounter: Payer: Self-pay | Admitting: Physician Assistant

## 2018-10-15 VITALS — BP 122/76 | HR 88 | Temp 97.3°F | Ht 67.5 in | Wt 205.8 lb

## 2018-10-15 DIAGNOSIS — M48061 Spinal stenosis, lumbar region without neurogenic claudication: Secondary | ICD-10-CM

## 2018-10-15 MED ORDER — PREDNISONE 20 MG PO TABS: ORAL_TABLET | ORAL | 0 refills | Status: AC

## 2018-10-15 NOTE — Progress Notes (Signed)
Subjective:    Patient ID: Marvin Carter, male    DOB: 07-08-1955, 63 y.o.   MRN: [id number]  HPI 63 y.o. WM presents with back pain.  He had MRI, following with ortho, last saw 07/14. L4-L5 spinal stenosis, has had injections without help. He states it is worse with standing up, better with sitting and bending over. No pain down his legs.  Granddaughter with neurofibroblastoma, at [facility], getting stem cells, has had several rounds of chemo/radiation knew x Feb, very stressful.   Blood pressure 122/76, pulse 88, temperature (!) 97.3 F (36.3 C), height 5' 7.5" (1.715 m), weight 205 lb 12.8 oz (93.4 kg), SpO2 98 %.  Medications Current Outpatient Medications on File Prior to Visit  Medication Sig  . acetaminophen (TYLENOL) 325 MG tablet Take 2 tablets (650 mg total) by mouth every 4 (four) hours as needed for mild pain ((score 1 to 3) or temp > 100.5).  Marland Kitchen ALPRAZolam (XANAX) 1 MG tablet Take 1/2-1 tablet ONLY if needed for Sleep &  limit to 5 days /week to avoid addiction  . Ascorbic Acid (VITAMIN C PO) Take 1 tablet by mouth daily as needed (for immune system support).  . ASPIRIN 81 PO Take 81 mg by mouth daily.   Marland Kitchen atenolol (TENORMIN) 100 MG tablet Take 1 tablet Daily for BP  . atorvastatin (LIPITOR) 40 MG tablet Take 1 tablet daily for Cholesterol  . buPROPion (WELLBUTRIN XL) 300 MG 24 hr tablet TAKE 1 TABLET BY MOUTH DAILY FOR MOOD  . clopidogrel (PLAVIX) 75 MG tablet Take 1 tablet Daily to prevent Blood Clots  . cyclobenzaprine (FLEXERIL) 10 MG tablet Take 1/2 to 1 tablet 3 x /day as needed for Muscle Spasm  . Desoximetasone (TOPICORT) 0.25 % ointment Apply 4 x /day to irritated rash  . diclofenac sodium (VOLTAREN) 1 % GEL Apply 2 to 4 grams 2 to 4 x / day  . diltiazem (CARTIA XT) 240 MG 24 hr capsule TAKE 1 CAPSULE BY MOUTH EVERY DAY FOR BLOOD PRESSURE  . doxazosin (CARDURA) 8 MG tablet Take 1 tablet Daily for BP & Prostate  . famotidine (PEPCID) 40 MG tablet Take 1 tablet daily for  acid indigestion & reflux  . furosemide (LASIX) 80 MG tablet Take 1 tablet daily for BP & Fluid Retention  . meloxicam (MOBIC) 15 MG tablet Take 1/2 to 1 tablet daily & limit to 4-5 days /week  to avoid kidney damage  . Multiple Vitamin (MULTIVITAMIN WITH MINERALS) TABS Take 2 tablets by mouth daily. Mega Men Multivitamin.  . nitroGLYCERIN (NITROSTAT) 0.4 MG SL tablet PLACE 1 TABLET (0.4 MG TOTAL) UNDER THE TONGUE EVERY 5 (FIVE) MINUTES AS NEEDED FOR CHEST PAIN.  Marland Kitchen olmesartan (BENICAR) 40 MG tablet DO NOT TAKE THIS MEDICATION UNLESS BP IS GREATER THAN 140/90 (Patient taking differently: 20 mg. DO NOT TAKE THIS MEDICATION UNLESS BP IS GREATER THAN 140/90)  . pantoprazole (PROTONIX) 40 MG tablet Take 1 tablet Daily for Acid Indigestion & Heart Burn  . tamsulosin (FLOMAX) 0.4 MG CAPS capsule Take 1 capsule at Bedtime for Prostate / Urine Flow  . testosterone cypionate (DEPOTESTOSTERONE CYPIONATE) 200 MG/ML injection Inject 2 ml IM every 2 weeks   No current facility-administered medications on file prior to visit.     Problem list He has Prolapsed external hemorrhoids; Hemorrhoids, internal, with bleeding; NSVT (nonsustained ventricular tachycardia) ([place]); Atrial flutter, paroxysmal ([place]); CAD (coronary artery disease), native coronary artery; Stented coronary artery; Hypertension; Mixed hyperlipidemia; GERD (gastroesophageal reflux disease);  Anxiety; Diverticulosis; Other abnormal glucose; Vitamin D deficiency; Medication management; Testosterone deficiency; Drug-induced erectile dysfunction; Primary osteoarthritis of right knee; Benign hypertensive heart and CKD, stage 3 (GFR 30-59), w CHF ([place]); Obesity (BMI 30.0-34.9); and Spinal stenosis of lumbar region on their problem list.   Review of Systems  Constitutional: Negative.  Negative for chills and fever.  HENT: Negative.   Respiratory: Negative.   Cardiovascular: Negative.   Gastrointestinal: Negative.   Genitourinary: Negative.  Negative  for flank pain, frequency, hematuria and urgency.  Musculoskeletal: Positive for back pain and gait problem. Negative for arthralgias.  Skin: Negative.  Negative for rash.  Neurological: Negative for tremors, weakness and numbness.  Psychiatric/Behavioral: Negative.        Objective:   Physical Exam Musculoskeletal:     Comments: Patient is able to ambulate well. Gait is antalgic and the patient is walking with lumbar kyphosis.  Sensory exam in the legs are normal. Strength is normal and symmetric in arms and legs. There is not SI tenderness to palpation.  There is not paraspinal muscle spasm.  There is midline tenderness.  ROM of spine with limited in extension due to pain.          Assessment & Plan:  Back pain sounds likely spinal stenosis Unable to find a trigger point Will do oral prednisone taper and encouraged to follow up with ortho for another injection or procedure.  Continue physical therapy at home

## 2018-10-15 NOTE — Patient Instructions (Signed)
Follow up ortho    Spinal Stenosis  Spinal stenosis occurs when the open space (spinal canal) between the bones of your spine (vertebrae) narrows, putting pressure on the spinal cord or nerves. What are the causes? This condition is caused by areas of bone pushing into the central canals of your vertebrae. This condition may be present at birth (congenital), or it may be caused by:  Arthritic deterioration of your vertebrae (spinal degeneration). This usually starts around age 88.  Injury or trauma to the spine.  Tumors in the spine.  Calcium deposits in the spine. What are the signs or symptoms? Symptoms of this condition include:  Pain in the neck or back that is generally worse with activities, particularly when standing and walking.  Numbness, tingling, hot or cold sensations, weakness, or weariness in your legs.  Pain going up and down the leg (sciatica).  Frequent episodes of falling.  A foot-slapping gait that leads to muscle weakness. In more serious cases, you may develop:  Problemspassing stool or passing urine.  Difficulty having sex.  Loss of feeling in part or all of your leg. Symptoms may come on slowly and get worse over time. How is this diagnosed? This condition is diagnosed based on your medical history and a physical exam. Tests will also be done, such as:  MRI.  CT scan.  X-ray. How is this treated? Treatment for this condition often focuses on managing your pain and any other symptoms. Treatment may include:  Practicing good posture to lessen pressure on your nerves.  Exercising to strengthen muscles, build endurance, improve balance, and maintain good joint movement (range of motion).  Losing weight, if needed.  Taking medicines to reduce swelling, inflammation, or pain.  Assistive devices, such as a corset or brace. In some cases, surgery may be needed. The most common procedure is decompression laminectomy. This is done to remove excess  bone that puts pressure on your nerve roots. Follow these instructions at home: Managing pain, stiffness, and swelling  Do all exercises and stretches as told by your health care provider.  Practice good posture. If you were given a brace or a corset, wear it as told by your health care provider.  Do not do any activities that cause pain. Ask your health care provider what activities are safe for you.  Do not lift anything that is heavier than 10 lb (4.5 kg) or the limit that your health care provider tells you.  Maintain a healthy weight. Talk with your health care provider if you need help losing weight.  If directed, apply heat to the affected area as often as told by your health care provider. Use the heat source that your health care provider recommends, such as a moist heat pack or a heating pad. ? Place a towel between your skin and the heat source. ? Leave the heat on for 20-30 minutes. ? Remove the heat if your skin turns bright red. This is especially important if you are not able to feel pain, heat, or cold. You may have a greater risk of getting burned. General instructions  Take over-the-counter and prescription medicines only as told by your health care provider.  Do not use any products that contain nicotine or tobacco, such as cigarettes and e-cigarettes. If you need help quitting, ask your health care provider.  Eat a healthy diet. This includes plenty of fruits and vegetables, whole grains, and low-fat (lean) protein.  Keep all follow-up visits as told by your health  care provider. This is important. Contact a health care provider if:  Your symptoms do not get better or they get worse.  You have a fever. Get help right away if:  You have new or worse pain in your neck or upper back.  You have severe pain that cannot be controlled with medicines.  You are dizzy.  You have vision problems, blurred vision, or double vision.  You have a severe headache that is  worse when you stand.  You have nausea or you vomit.  You develop new or worse numbness or tingling in your back or legs.  You have pain, redness, swelling, or warmth in your arm or leg. Summary  Spinal stenosis occurs when the open space (spinal canal) between the bones of your spine (vertebrae) narrows. This narrowing puts pressure on the spinal cord or nerves.  Spinal stenosis can cause numbness, weakness, or pain in the neck, back, and legs.  This condition may be caused by a birth defect, arthritic deterioration of your vertebrae, injury, tumors, or calcium deposits.  This condition is usually diagnosed with MRIs, CT scans, and X-rays. This information is not intended to replace advice given to you by your health care provider. Make sure you discuss any questions you have with your health care provider. Document Released: 04/28/2003 Document Revised: 01/18/2017 Document Reviewed: 01/11/2016 Elsevier Patient Education  2020 [employer].

## 2018-10-25 ENCOUNTER — Other Ambulatory Visit: Payer: Self-pay | Admitting: Internal Medicine

## 2018-10-25 MED ORDER — CEPHALEXIN 500 MG PO CAPS: ORAL_CAPSULE | ORAL | 0 refills | Status: DC

## 2018-10-28 ENCOUNTER — Encounter: Payer: Self-pay | Admitting: Internal Medicine

## 2018-10-28 NOTE — Progress Notes (Signed)
History of Present Illness:      This very nice 63 y.o. MWM presents for 6 month follow up with HTN, ASCAD,  HLD, Pre-Diabetes and Vitamin D Deficiency.       Patient is treated for HTN (1989)  & BP has been controlled at home. Today's BP is at goal - 124/76.  Patient has hx/o a Negative / Normal Heart Cath in 2009 and then in 2012 presented with ACS and had PCA/DES implanted.  Patient also has CKD3 attributed to hid HTCVD. Patient has had no complaints of any cardiac type chest pain, palpitations, dyspnea / orthopnea / PND, dizziness, claudication, or dependent edema.      Hyperlipidemia is controlled with diet & meds. Patient denies myalgias or other med SE's. Last Lipids were at goal: Lab Results  Component Value Date   CHOL 139 07/28/2018   HDL 53 07/28/2018   LDLCALC 64 07/28/2018   TRIG 137 07/28/2018   CHOLHDL 2.6 07/28/2018       Also, the patient has history of PreDiabetes (A1c 5.8% / 2008) and has had no symptoms of reactive hypoglycemia, diabetic polys, paresthesias or visual blurring.  Last A1c was Normal & at goal: Lab Results  Component Value Date   HGBA1C 5.2 04/16/2018       Further, the patient also has history of Vitamin D Deficiency ("19" / 2009) and supplements vitamin D without any suspected side-effects. Last vitamin D was at goal: Lab Results  Component Value Date   VD25OH 76 07/28/2018   Current Outpatient Medications on File Prior to Visit  Medication Sig  . acetaminophen (TYLENOL) 325 MG tablet Take 2 tablets (650 mg total) by mouth every 4 (four) hours as needed for mild pain ((score 1 to 3) or temp > 100.5).  Marland Kitchen ALPRAZolam (XANAX) 1 MG tablet Take 1/2-1 tablet ONLY if needed for Sleep &  limit to 5 days /week to avoid addiction  . Ascorbic Acid (VITAMIN C PO) Take 1 tablet by mouth daily as needed (for immune system support).  . ASPIRIN 81 PO Take 81 mg by mouth daily.   Marland Kitchen atenolol (TENORMIN) 100 MG tablet Take 1 tablet Daily for BP  . atorvastatin  (LIPITOR) 40 MG tablet Take 1 tablet daily for Cholesterol  . buPROPion (WELLBUTRIN XL) 300 MG 24 hr tablet TAKE 1 TABLET BY MOUTH DAILY FOR MOOD  . cephALEXin (KEFLEX) 500 MG capsule Take 1 capsule 4 x /day with meals & bedtime for skin infection  . clopidogrel (PLAVIX) 75 MG tablet Take 1 tablet Daily to prevent Blood Clots  . cyclobenzaprine (FLEXERIL) 10 MG tablet Take 1/2 to 1 tablet 3 x /day as needed for Muscle Spasm  . Desoximetasone (TOPICORT) 0.25 % ointment Apply 4 x /day to irritated rash  . diclofenac sodium (VOLTAREN) 1 % GEL Apply 2 to 4 grams 2 to 4 x / day  . diltiazem (CARTIA XT) 240 MG 24 hr capsule TAKE 1 CAPSULE BY MOUTH EVERY DAY FOR BLOOD PRESSURE  . doxazosin (CARDURA) 8 MG tablet Take 1 tablet Daily for BP & Prostate  . famotidine (PEPCID) 40 MG tablet Take 1 tablet daily for acid indigestion & reflux  . furosemide (LASIX) 80 MG tablet Take 1 tablet daily for BP & Fluid Retention  . meloxicam (MOBIC) 15 MG tablet Take 1/2 to 1 tablet daily & limit to 4-5 days /week  to avoid kidney damage  . Multiple Vitamin (MULTIVITAMIN WITH MINERALS) TABS Take  2 tablets by mouth daily. Mega Men Multivitamin.  . nitroGLYCERIN (NITROSTAT) 0.4 MG SL tablet PLACE 1 TABLET (0.4 MG TOTAL) UNDER THE TONGUE EVERY 5 (FIVE) MINUTES AS NEEDED FOR CHEST PAIN.  Marland Kitchen pantoprazole (PROTONIX) 40 MG tablet Take 1 tablet Daily for Acid Indigestion & Heart Burn  . tamsulosin (FLOMAX) 0.4 MG CAPS capsule Take 1 capsule at Bedtime for Prostate / Urine Flow  . testosterone cypionate (DEPOTESTOSTERONE CYPIONATE) 200 MG/ML injection Inject 2 ml IM every 2 weeks   No current facility-administered medications on file prior to visit.    Allergies  Allergen Reactions  . Hctz [Hydrochlorothiazide]   . Tape Rash    Clear tape   PMHx:   Past Medical History:  Diagnosis Date  . Anxiety   . Arthritis   . Atrial flutter, paroxysmal ([place]) 01/04/2011  . CAD (coronary artery disease), native coronary artery  01/04/2011  . Diverticulitis   . Diverticulosis   . GERD (gastroesophageal reflux disease)   . Hemarthrosis of knee, right 01/04/2011  . Hemorrhoids   . History of meniscectomy of right knee 01/02/2011  . Hyperlipemia 01/04/2011  . Hyperlipidemia   . Hypertension   . Meniscus tear 01/02/2011  . NSVT (nonsustained ventricular tachycardia) ([place]) 01/02/2011  . Primary osteoarthritis of right knee 04/03/2017  . Right knee pain 01/02/2011  . Stented coronary artery 01/04/2011   LAD DES Resolute.  . Torn medial meniscus    right knee   Immunization History  Administered Date(s) Administered  . Influenza Split 12/16/2013, 12/02/2014  . Influenza Whole 12/09/2012  . Influenza, Seasonal, Injecte, Preservative Fre 12/01/2015  . Influenza-Unspecified 11/19/2016, 11/30/2017  . PPD Test 11/05/2013, 01/04/2015, 02/10/2016, 03/21/2017, 04/16/2018  . Td 02/19/2005  . Tdap 03/01/2015   Past Surgical History:  Procedure Laterality Date  . APPENDECTOMY    . COLONOSCOPY    . CORONARY ANGIOPLASTY WITH STENT PLACEMENT  2012  . HAND SURGERY     right  . KNEE ARTHROSCOPY     x 8 left  . KNEE ARTHROSCOPY Right 04/21/2012   Procedure: ARTHROSCOPY KNEE;  Surgeon: Lorn Junes, MD;  Location: [place];  Service: Orthopedics;  Laterality: Right;  medial and lateral meniscectomies  . LEFT HEART CATHETERIZATION WITH CORONARY ANGIOGRAM N/A 01/03/2011   Procedure: LEFT HEART CATHETERIZATION WITH CORONARY ANGIOGRAM;  Surgeon: Lorretta Harp, MD;  Location: [facility] CATH LAB;  Service: Cardiovascular;  Laterality: N/A;  . PERCUTANEOUS CORONARY STENT INTERVENTION (PCI-S) N/A 01/03/2011   Procedure: PERCUTANEOUS CORONARY STENT INTERVENTION (PCI-S);  Surgeon: Lorretta Harp, MD;  Location: [facility] CATH LAB;  Service: Cardiovascular;  Laterality: N/A;  . ROTATOR CUFF REPAIR     bilateral  . TONSILLECTOMY    . TOTAL KNEE ARTHROPLASTY Right 04/15/2017  . TOTAL KNEE ARTHROPLASTY Right 04/15/2017   Procedure: TOTAL KNEE  ARTHROPLASTY;  Surgeon: Elsie Saas, MD;  Location: [place];  Service: Orthopedics;  Laterality: Right;  . TRICEPS TENDON REPAIR Bilateral    FHx:    Reviewed / unchanged  SHx:    Reviewed / unchanged   Systems Review:  Constitutional: Denies fever, chills, wt changes, headaches, insomnia, fatigue, night sweats, change in appetite. Eyes: Denies redness, blurred vision, diplopia, discharge, itchy, watery eyes.  ENT: Denies discharge, congestion, post nasal drip, epistaxis, sore throat, earache, hearing loss, dental pain, tinnitus, vertigo, sinus pain, snoring.  CV: Denies chest pain, palpitations, irregular heartbeat, syncope, dyspnea, diaphoresis, orthopnea, PND, claudication or edema. Respiratory: denies cough, dyspnea, DOE, pleurisy, hoarseness, laryngitis, wheezing.  Gastrointestinal:  Denies dysphagia, odynophagia, heartburn, reflux, water brash, abdominal pain or cramps, nausea, vomiting, bloating, diarrhea, constipation, hematemesis, melena, hematochezia  or hemorrhoids. Genitourinary: Denies dysuria, frequency, urgency, nocturia, hesitancy, discharge, hematuria or flank pain. Musculoskeletal: Denies arthralgias, myalgias, stiffness, jt. swelling, pain, limping or strain/sprain.  Skin: Denies pruritus, rash, hives, warts, acne, eczema or change in skin lesion(s). Neuro: No weakness, tremor, incoordination, spasms, paresthesia or pain. Psychiatric: Denies confusion, memory loss or sensory loss. Endo: Denies change in weight, skin or hair change.  Heme/Lymph: No excessive bleeding, bruising or enlarged lymph nodes.  Physical Exam  BP 124/76   Pulse 60   Temp 97.6 F (36.4 C)   Resp 16   Ht 5' 7.5" (1.715 m)   Wt 206 lb 9.6 oz (93.7 kg)   BMI 31.88 kg/m   Appears  well nourished, well groomed  and in no distress.  Eyes: PERRLA, EOMs, conjunctiva no swelling or erythema. Sinuses: No frontal/maxillary tenderness ENT/Mouth: EAC's clear, TM's nl w/o erythema, bulging. Nares  clear w/o erythema, swelling, exudates. Oropharynx clear without erythema or exudates. Oral hygiene is good. Tongue normal, non obstructing. Hearing intact.  Neck: Supple. Thyroid not palpable. Car 2+/2+ without bruits, nodes or JVD. Chest: Respirations nl with BS clear & equal w/o rales, rhonchi, wheezing or stridor.  Cor: Heart sounds normal w/ regular rate and rhythm without sig. murmurs, gallops, clicks or rubs. Peripheral pulses normal and equal  without edema.  Abdomen: Soft & bowel sounds normal. Non-tender w/o guarding, rebound, hernias, masses or organomegaly.  Lymphatics: Unremarkable.  Musculoskeletal: Full ROM all peripheral extremities, joint stability, 5/5 strength and normal gait.  Skin: Warm, dry without exposed rashes, lesions or ecchymosis apparent.  Neuro: Cranial nerves intact, reflexes equal bilaterally. Sensory-motor testing grossly intact. Tendon reflexes grossly intact.  Pysch: Alert & oriented x 3.  Insight and judgement nl & appropriate. No ideations.  Assessment and Plan:  1. Essential hypertension  - Continue medication, monitor blood pressure at home.  - Continue DASH diet.  Reminder to go to the ER if any CP,  SOB, nausea, dizziness, severe HA, changes vision/speech.  - CBC with Differential/Platelet - COMPLETE METABOLIC PANEL WITH GFR - Magnesium - TSH  2. Hyperlipidemia, mixed  - Continue diet/meds, exercise,& lifestyle modifications.  - Continue monitor periodic cholesterol/liver & renal functions   - Lipid panel - TSH  3. Abnormal glucose  - Continue diet, exercise  - Lifestyle modifications.  - Monitor appropriate labs. - Hemoglobin A1c  - Insulin, random  4. Vitamin D deficiency  - Continue supplementation.  - VITAMIN D 25 Hydroxyl  5. Prediabetes  - Hemoglobin A1c - Insulin, random  6. Coronary artery disease involving native coronary artery  of native heart without angina pectoris  - Lipid panel  7. Benign hypertensive  heart and CKD, stage 3 (GFR 30-59) (H[facility])  - COMPLETE METABOLIC PANEL WITH GFR  8. Testosterone deficiency  - Testosterone  9. Medication management  - CBC with Differential/Platelet - COMPLETE METABOLIC PANEL WITH GFR - Magnesium - Lipid panel - TSH - Hemoglobin A1c - Insulin, random - VITAMIN D 25 Hydroxyl - Testosterone      Discussed  regular exercise, BP monitoring, weight control to achieve/maintain BMI less than 25 and discussed med and SE's. Recommended labs to assess and monitor clinical status with further disposition pending results of labs.  I discussed the assessment and treatment plan with the patient. The patient was provided an opportunity to ask questions and all were answered. The patient  agreed with the plan and demonstrated an understanding of the instructions.  I provided over 30 minutes of exam, counseling, chart review and  complex critical decision making.  Kirtland Bouchard, MD

## 2018-10-28 NOTE — Patient Instructions (Signed)

## 2018-10-29 ENCOUNTER — Ambulatory Visit: Payer: 59 | Admitting: Internal Medicine

## 2018-10-29 ENCOUNTER — Other Ambulatory Visit: Payer: Self-pay | Admitting: *Deleted

## 2018-10-29 ENCOUNTER — Other Ambulatory Visit: Payer: Self-pay

## 2018-10-29 VITALS — BP 124/76 | HR 60 | Temp 97.6°F | Resp 16 | Ht 67.5 in | Wt 206.6 lb

## 2018-10-29 DIAGNOSIS — I10 Essential (primary) hypertension: Secondary | ICD-10-CM

## 2018-10-29 DIAGNOSIS — R7309 Other abnormal glucose: Secondary | ICD-10-CM | POA: Diagnosis not present

## 2018-10-29 DIAGNOSIS — E559 Vitamin D deficiency, unspecified: Secondary | ICD-10-CM | POA: Diagnosis not present

## 2018-10-29 DIAGNOSIS — I2510 Atherosclerotic heart disease of native coronary artery without angina pectoris: Secondary | ICD-10-CM

## 2018-10-29 DIAGNOSIS — N183 Chronic kidney disease, stage 3 unspecified: Secondary | ICD-10-CM

## 2018-10-29 DIAGNOSIS — E782 Mixed hyperlipidemia: Secondary | ICD-10-CM

## 2018-10-29 DIAGNOSIS — R7303 Prediabetes: Secondary | ICD-10-CM

## 2018-10-29 DIAGNOSIS — E349 Endocrine disorder, unspecified: Secondary | ICD-10-CM

## 2018-10-29 DIAGNOSIS — I130 Hypertensive heart and chronic kidney disease with heart failure and stage 1 through stage 4 chronic kidney disease, or unspecified chronic kidney disease: Secondary | ICD-10-CM

## 2018-10-29 DIAGNOSIS — Z79899 Other long term (current) drug therapy: Secondary | ICD-10-CM

## 2018-10-29 MED ORDER — OLMESARTAN MEDOXOMIL 40 MG PO TABS: ORAL_TABLET | ORAL | 1 refills | Status: DC

## 2018-10-29 MED ORDER — PREDNISONE 20 MG PO TABS: ORAL_TABLET | ORAL | 1 refills | Status: DC

## 2018-10-30 LAB — CBC WITH DIFFERENTIAL/PLATELET
Absolute Monocytes: 696 cells/uL (ref 200–950)
Basophils Absolute: 32 cells/uL (ref 0–200)
Basophils Relative: 0.4 %
Eosinophils Absolute: 128 cells/uL (ref 15–500)
Eosinophils Relative: 1.6 %
HCT: 38.2 % — ABNORMAL LOW (ref 38.5–50.0)
Hemoglobin: 12.4 g/dL — ABNORMAL LOW (ref 13.2–17.1)
Lymphs Abs: 2368 cells/uL (ref 850–3900)
MCH: 30.7 pg (ref 27.0–33.0)
MCHC: 32.5 g/dL (ref 32.0–36.0)
MCV: 94.6 fL (ref 80.0–100.0)
MPV: 10.5 fL (ref 7.5–12.5)
Monocytes Relative: 8.7 %
Neutro Abs: 4776 cells/uL (ref 1500–7800)
Neutrophils Relative %: 59.7 %
Platelets: 225 10*3/uL (ref 140–400)
RBC: 4.04 10*6/uL — ABNORMAL LOW (ref 4.20–5.80)
RDW: 12.9 % (ref 11.0–15.0)
Total Lymphocyte: 29.6 %
WBC: 8 10*3/uL (ref 3.8–10.8)

## 2018-10-30 LAB — COMPLETE METABOLIC PANEL WITH GFR
AG Ratio: 1.9 (calc) (ref 1.0–2.5)
ALT: 30 U/L (ref 9–46)
AST: 33 U/L (ref 10–35)
Albumin: 4.2 g/dL (ref 3.6–5.1)
Alkaline phosphatase (APISO): 32 U/L — ABNORMAL LOW (ref 35–144)
BUN: 19 mg/dL (ref 7–25)
CO2: 26 mmol/L (ref 20–32)
Calcium: 9.3 mg/dL (ref 8.6–10.3)
Chloride: 106 mmol/L (ref 98–110)
Creat: 1.16 mg/dL (ref 0.70–1.25)
GFR, Est African American: 77 mL/min/{1.73_m2} (ref >=60)
GFR, Est Non African American: 67 mL/min/{1.73_m2} (ref >=60)
Globulin: 2.2 g/dL (calc) (ref 1.9–3.7)
Glucose, Bld: 100 mg/dL — ABNORMAL HIGH (ref 65–99)
Potassium: 3.8 mmol/L (ref 3.5–5.3)
Sodium: 140 mmol/L (ref 135–146)
Total Bilirubin: 0.6 mg/dL (ref 0.2–1.2)
Total Protein: 6.4 g/dL (ref 6.1–8.1)

## 2018-10-30 LAB — VITAMIN D 25 HYDROXY (VIT D DEFICIENCY, FRACTURES): Vit D, 25-Hydroxy: 87 ng/mL (ref 30–100)

## 2018-10-30 LAB — LIPID PANEL
Cholesterol: 152 mg/dL (ref <200)
HDL: 58 mg/dL (ref >=40)
LDL Cholesterol (Calc): 74 mg/dL (calc)
Non-HDL Cholesterol (Calc): 94 mg/dL (calc) (ref <130)
Total CHOL/HDL Ratio: 2.6 (calc) (ref <5.0)
Triglycerides: 114 mg/dL (ref <150)

## 2018-10-30 LAB — INSULIN, RANDOM: Insulin: 4.8 u[IU]/mL

## 2018-10-30 LAB — TESTOSTERONE: Testosterone: 1017 ng/dL — ABNORMAL HIGH (ref 250–827)

## 2018-10-30 LAB — HEMOGLOBIN A1C
Hgb A1c MFr Bld: 5.5 % of total Hgb (ref <5.7)
Mean Plasma Glucose: 111 (calc)
eAG (mmol/L): 6.2 (calc)

## 2018-10-30 LAB — MAGNESIUM: Magnesium: 1.2 mg/dL — ABNORMAL LOW (ref 1.5–2.5)

## 2018-10-30 LAB — TSH: TSH: 0.69 mIU/L (ref 0.40–4.50)

## 2018-11-21 ENCOUNTER — Other Ambulatory Visit: Payer: Self-pay | Admitting: Internal Medicine

## 2018-11-21 MED ORDER — CYCLOBENZAPRINE HCL 10 MG PO TABS: ORAL_TABLET | ORAL | 2 refills | Status: DC

## 2018-12-01 ENCOUNTER — Other Ambulatory Visit: Payer: Self-pay | Admitting: Internal Medicine

## 2018-12-01 MED ORDER — TAMSULOSIN HCL 0.4 MG PO CAPS: ORAL_CAPSULE | ORAL | 3 refills | Status: DC

## 2018-12-03 ENCOUNTER — Other Ambulatory Visit (HOSPITAL_COMMUNITY): Payer: Self-pay | Admitting: *Deleted

## 2018-12-03 DIAGNOSIS — Z955 Presence of coronary angioplasty implant and graft: Secondary | ICD-10-CM

## 2018-12-21 ENCOUNTER — Other Ambulatory Visit: Payer: Self-pay | Admitting: Internal Medicine

## 2018-12-21 DIAGNOSIS — M48061 Spinal stenosis, lumbar region without neurogenic claudication: Secondary | ICD-10-CM

## 2018-12-21 DIAGNOSIS — F419 Anxiety disorder, unspecified: Secondary | ICD-10-CM

## 2018-12-21 MED ORDER — ALPRAZOLAM 1 MG PO TABS: ORAL_TABLET | ORAL | 0 refills | Status: DC

## 2018-12-21 MED ORDER — GABAPENTIN 300 MG PO CAPS: ORAL_CAPSULE | ORAL | 0 refills | Status: DC

## 2018-12-24 NOTE — Progress Notes (Signed)
Mr. Luman will begin the 12-week, twice weekly PREP at [facility] on 12/30/18 to run on Tu/Th 2:30-3:45.

## 2019-01-07 NOTE — Progress Notes (Signed)
[facility] [employer] PREP Weekly Session   Patient Details  Name: Marvin Carter MRN: [id number] Date of Birth: 10-Jun-1955 Age: 63 y.o. PCP: Unk Pinto, MD  There were no vitals filed for this visit.  [facility] [employer] Weekly seesion - 01/07/19 1500      Weekly Session   Topic Discussed  Other   Perceived Rate of Exertion   Minutes exercised this week  240 minutes   120cardio/120strength   Comments  this is his first class attended      This was Mr. Defer first attended class.    Fun things you did since last meeting:"watched a lot of animals from tree stand" Things you are grateful for:"life and family health:" Nutrition celebrations:"no cookies but some candy" Barriers: sciatica   Vanita Ingles 01/07/2019, 3:30 PM

## 2019-01-08 NOTE — Progress Notes (Signed)
[facility] PREP Weekly Session   Patient Details  Name: Mican Coby MRN: [id number] Date of Birth: 05/05/1955 Age: 63 y.o. PCP: Unk Pinto, MD  There were no vitals filed for this visit.  Cardio: 2 min march: 211 30 sec chair stand: 19 RT arm 8lb curl: 28  Balance: Double: 0 Single: 3 Tandem: 2  Vanita Ingles 01/08/2019, 9:22 PM

## 2019-01-18 ENCOUNTER — Other Ambulatory Visit: Payer: Self-pay | Admitting: Internal Medicine

## 2019-01-18 DIAGNOSIS — M48061 Spinal stenosis, lumbar region without neurogenic claudication: Secondary | ICD-10-CM

## 2019-01-18 MED ORDER — GABAPENTIN 300 MG PO CAPS: ORAL_CAPSULE | ORAL | 1 refills | Status: DC

## 2019-01-19 ENCOUNTER — Other Ambulatory Visit: Payer: Self-pay | Admitting: Internal Medicine

## 2019-01-19 MED ORDER — FUROSEMIDE 80 MG PO TABS: ORAL_TABLET | ORAL | 3 refills | Status: DC

## 2019-01-19 MED ORDER — MELOXICAM 15 MG PO TABS: ORAL_TABLET | ORAL | 3 refills | Status: DC

## 2019-01-21 ENCOUNTER — Other Ambulatory Visit: Payer: Self-pay | Admitting: Internal Medicine

## 2019-01-21 MED ORDER — NITROGLYCERIN 0.4 MG SL SUBL: SUBLINGUAL_TABLET | SUBLINGUAL | 99 refills | Status: DC

## 2019-01-21 MED ORDER — TADALAFIL 20 MG PO TABS: ORAL_TABLET | ORAL | 11 refills | Status: DC

## 2019-01-21 NOTE — Progress Notes (Signed)
[facility] [employer] PREP Weekly Session   Patient Details  Name: [name] MRN: [id number] Date of Birth: 1955-10-01 Age: 63 y.o. PCP: Unk Pinto, MD  Vitals:   01/21/19 1154  Weight: 200 lb 6.4 oz (90.9 kg)    [facility] [employer] Weekly seesion - 01/20/19 1154      Weekly Session   Topic Discussed  Other ways to be active    Minutes exercised this week  120 minutes   cardio     Fun things you did since last mtg:"deer hunt" Nutrition celebrations:":ate a duck" Barriers:"back/glute"   Vanita Ingles 01/21/2019, 11:54 AM

## 2019-02-02 ENCOUNTER — Other Ambulatory Visit: Payer: Self-pay | Admitting: Internal Medicine

## 2019-02-02 MED ORDER — DILTIAZEM HCL ER COATED BEADS 240 MG PO CP24: ORAL_CAPSULE | ORAL | 3 refills | Status: DC

## 2019-02-02 NOTE — Progress Notes (Signed)
3 Month Follow Up   Assessment and Plan:   Marvin Carter was seen today for follow-up and other.  Diagnoses and all orders for this visit:  Essential hypertension Continue current medications: Atenolol 100mg , cardizem 240mg  daily Monitor blood pressure at home; call if consistently over 130/80 Continue DASH diet.   Reminder to go to the ER if any CP, SOB, nausea, dizziness, severe HA, changes vision/speech, left arm numbness and tingling and jaw pain. -     COMPLETE METABOLIC PANEL WITH GFR -     TSH -     Magnesium  Hyperlipidemia, mixed Continue medications: Discussed dietary and exercise modifications Low fat diet -     Lipid Profile  Abnormal glucose Discussed dietary and exercise modifications -     Hemoglobin A1c ([facility])  Vitamin D deficiency Discussed dietary and exercise modifications -     Vitamin D (25 hydroxy)  Stage 3a chronic kidney disease Increase fluids  Avoid NSAIDS Blood pressure control Monitor sugars  Will continue to monitor  Coronary artery disease involving native coronary artery of native heart without angina pectoris Following with cardiology Currently going to cardiac rehab in the afternoons  Stented coronary artery Control blood pressure, lipids and glucose Disscused lifestyle modifications, diet & exercise Continue to monitor  Gastroesophageal reflux disease, unspecified whether esophagitis present Doing well at this time Continue: famotidine 40mg  daily Diet discussed Monitor for triggers Avoid food with high acid content Avoid excessive cafeine Increase water intake   Anxiety Doing well on current regiment Continue xanax 1mg , 1/2 to 1 tablet as needed Discussed stress management techniques   Discussed good sleep hygiene Discussed increasing physical activity and exercise Increase water intake  Testosterone deficiency -     testosterone cypionate (DEPOTESTOSTERONE CYPIONATE) 200 MG/ML injection; Inject 2 ml IM every 2  weeks  Obesity (BMI 30.0-34.9) Discussed dietary and exercise modifications  Medication management Continued  Chronic midline low back pain without sciatica Follows with ortho, injections have helped Stopped taking gabapentin 300mg  TID Also stopped taking cyclobenzaprine  BPH w/ LUTS Doing well at this time Continue medications: Cardura 8mg  and tamsulosin 0.4mg . Will continue to monitor Defer PSA this check  Primary osteoarthritis of both knees History Right knee replacement Left doing well at this time, reports likely need replacement in future Continue to monitor     Continue diet and meds as discussed. Further disposition pending results of labs. Discussed med's effects and SE's.  Patient agrees with plan of care and opportunity to ask questions/voice concerns. Over 30 minutes of chart review, interview, exam, counseling, and critical decision making was performed.   Future Appointments  Date Time Provider [place]  05/08/2019 10:00 AM Unk Pinto, MD [id number] None    ----------------------------------------------------------------------------------------------------------------------  HPI 63 y.o. male  presents for 3 month follow up on HTN, ASCAD, CKDIII HLD,GERD, abnormal glucose with history of pre-diabetes, ED, weight and vitamin D deficiency.   Reports that a month ago he felt a pop in his left buttock.  He had an appointment with [name].  Completed ultrasound with negative for any acute abnormalities.  He received an injection and reports that this has improved.  He also has history of chronic low back pain, doing well at this time.  BMI is Body mass index is 30.58 kg/m., he has been working on diet and exercise. Wt Readings from Last 3 Encounters:  02/03/19 198 lb 3.2 oz (89.9 kg)  01/21/19 200 lb 6.4 oz (90.9 kg)  01/06/19 203 lb 3.2  oz (92.2 kg)     HTN predates 1989. He follows with cardiology, Dr Gwenlyn Found.  Negative heart cath  2009.  Presented with ACS resulting in PCA/DES implant.  He also has CKDIII as a result of HTN/CVD.  He is taking atenolol 100mg , cardizem 240mg , olmesartan 40mg  and lasix daily.  His blood pressure has been controlled at home, today their BP is BP: 118/82  He does workout. He denies any cardiac symptoms, chest pains, palpitations, shortness of breath, dizziness or lower extremity edema.     He is on cholesterol medication Atorvastatin and denies myalgias.   His cholesterol is at goal. The cholesterol last visit was:   Lab Results  Component Value Date   CHOL 152 10/29/2018   HDL 58 10/29/2018   LDLCALC 74 10/29/2018   TRIG 114 10/29/2018   CHOLHDL 2.6 10/29/2018    He has been working on diet and exercise for prediabetes (A1c 5.8% / 2008), and denies paresthesia of the feet, polydipsia, polyuria, visual disturbances, vomiting and weight loss. Last A1C in the office was:  Lab Results  Component Value Date   HGBA1C 5.5 10/29/2018   Patient is on Vitamin D supplement related to deficiency (19 / 2009)   Lab Results  Component Value Date   VD25OH 87 10/29/2018       Current Medications:  Current Outpatient Medications on File Prior to Visit  Medication Sig  . acetaminophen (TYLENOL) 325 MG tablet Take 2 tablets (650 mg total) by mouth every 4 (four) hours as needed for mild pain ((score 1 to 3) or temp > 100.5).  Marland Kitchen ALPRAZolam (XANAX) 1 MG tablet Take 1/2-1 tablet ONLY if needed for Sleep &  limit to 5 days /week to avoid addiction  . Ascorbic Acid (VITAMIN C PO) Take 1 tablet by mouth daily as needed (for immune system support).  . ASPIRIN 81 PO Take 81 mg by mouth daily.   Marland Kitchen atenolol (TENORMIN) 100 MG tablet Take 1 tablet Daily for BP  . atorvastatin (LIPITOR) 40 MG tablet Take 1 tablet daily for Cholesterol  . buPROPion (WELLBUTRIN XL) 300 MG 24 hr tablet TAKE 1 TABLET BY MOUTH DAILY FOR MOOD  . clopidogrel (PLAVIX) 75 MG tablet Take 1 tablet Daily to prevent Blood Clots  .  cyclobenzaprine (FLEXERIL) 10 MG tablet Take 1/2 to 1 tablet 3 x /day as needed for Muscle Spasm  . Desoximetasone (TOPICORT) 0.25 % ointment Apply 4 x /day to irritated rash  . diclofenac sodium (VOLTAREN) 1 % GEL Apply 2 to 4 grams 2 to 4 x / day  . diltiazem (CARTIA XT) 240 MG 24 hr capsule Take 1 capsule Daily for BP  . doxazosin (CARDURA) 8 MG tablet Take 1 tablet Daily for BP & Prostate  . famotidine (PEPCID) 40 MG tablet Take 1 tablet daily for acid indigestion & reflux  . furosemide (LASIX) 80 MG tablet Take 1 tablet daily for BP & Fluid Retention / Ankle Swelling  . gabapentin (NEURONTIN) 300 MG capsule Take 1 capsule 3 x /day as needed  for Nerve Pains in back  . meloxicam (MOBIC) 15 MG tablet Take 1/2 to 1 tablet Daily with Food for Pain & Inflammation & limit to 4-5 days /week  to avoid kidney damage  . Multiple Vitamin (MULTIVITAMIN WITH MINERALS) TABS Take 2 tablets by mouth daily. Mega Men Multivitamin.  . nitroGLYCERIN (NITROSTAT) 0.4 MG SL tablet Dissolve 1 tablet under tongue every 3 to 5 minutes as needed for Chest Pain  .  olmesartan (BENICAR) 40 MG tablet TAKE 1/2 TO 1 TABLET OR AS DIRECTED. DO NOT TAKE THIS MEDICATION UNLESS BP IS GREATER THAN 140/90  . pantoprazole (PROTONIX) 40 MG tablet Take 1 tablet Daily for Acid Indigestion & Heart Burn  . predniSONE (DELTASONE) 20 MG tablet 1 tab 3 x day as directed for Severe Back Pain  . tadalafil (CIALIS) 20 MG tablet Take 1/2 to 1 tablet every 2 to 3 days as needed for XXXX  . tamsulosin (FLOMAX) 0.4 MG CAPS capsule Take 1 capsule at Bedtime for Prostate / Urine Flow   No current facility-administered medications on file prior to visit.    Allergies:  Allergies  Allergen Reactions  . Hctz [Hydrochlorothiazide]   . Tape Rash    Clear tape     Medical History:  Past Medical History:  Diagnosis Date  . Anxiety   . Arthritis   . Atrial flutter, paroxysmal ([place]) 01/04/2011  . CAD (coronary artery disease), native coronary  artery 01/04/2011  . Diverticulitis   . Diverticulosis   . GERD (gastroesophageal reflux disease)   . Hemarthrosis of knee, right 01/04/2011  . Hemorrhoids   . History of meniscectomy of right knee 01/02/2011  . Hyperlipemia 01/04/2011  . Hyperlipidemia   . Hypertension   . Meniscus tear 01/02/2011  . NSVT (nonsustained ventricular tachycardia) ([place]) 01/02/2011  . Primary osteoarthritis of right knee 04/03/2017  . Right knee pain 01/02/2011  . Stented coronary artery 01/04/2011   LAD DES [place].  . Torn medial meniscus    right knee    Family history- Reviewed and unchanged   Social history- Reviewed and unchanged  Patient Care Team: Unk Pinto, MD as PCP - General (Internal Medicine) Lorretta Harp, MD as PCP - Cardiology (Cardiology) Inda Castle, MD (Inactive) as Consulting Physician (Gastroenterology)   Screening Tests: Immunization History  Administered Date(s) Administered  . Influenza Split 12/16/2013, 12/02/2014  . Influenza Whole 12/09/2012  . Influenza, Seasonal, Injecte, Preservative Fre 12/01/2015  . Influenza-Unspecified 11/19/2016, 11/30/2017  . PPD Test 11/05/2013, 01/04/2015, 02/10/2016, 03/21/2017, 04/16/2018  . Td 02/19/2005  . Tdap 03/01/2015     Vaccinations: TD or Tdap: 2017  Influenza: 11/2018-[facility]  Pneumococcal: N/A - Next year at age 38 Prevnar13: N/A Shingles: Shingrix: Discussed with patient, educational material provided.   Preventative Care: Last colonoscopy: 2012 Hep C screening [id number]) 11/05/13 Neg  Imaging: Chest X-ray: 04/16/2003 EKG: 04/23/2012 Myoview: 09/15/2007    Review of Systems:  Review of Systems  Constitutional: Negative for chills, diaphoresis, fever, malaise/fatigue and weight loss.  HENT: Negative for congestion, ear discharge, ear pain, hearing loss, nosebleeds, sinus pain, sore throat and tinnitus.   Eyes: Negative for blurred vision, double vision, photophobia, pain, discharge and redness.   Respiratory: Negative for cough, hemoptysis, sputum production, shortness of breath, wheezing and stridor.   Cardiovascular: Negative for chest pain, palpitations, orthopnea, claudication, leg swelling and PND.  Gastrointestinal: Negative for abdominal pain, blood in stool, constipation, diarrhea, heartburn, melena, nausea and vomiting.  Genitourinary: Negative for dysuria, flank pain, frequency, hematuria and urgency.  Musculoskeletal: Positive for back pain. Negative for falls, joint pain, myalgias and neck pain.  Skin: Negative for itching and rash.  Neurological: Negative for dizziness, tingling, tremors, sensory change, speech change, focal weakness, seizures, loss of consciousness, weakness and headaches.  Endo/Heme/Allergies: Negative for environmental allergies and polydipsia. Does not bruise/bleed easily.  Psychiatric/Behavioral: Negative for depression, hallucinations, memory loss, substance abuse and suicidal ideas. The patient is not nervous/anxious and does not  have insomnia.       Physical Exam: BP 118/82   Pulse (!) 59   Temp (!) 97.3 F (36.3 C)   Ht 5' 7.5" (1.715 m)   Wt 198 lb 3.2 oz (89.9 kg)   SpO2 96%   BMI 30.58 kg/m  Wt Readings from Last 3 Encounters:  02/03/19 198 lb 3.2 oz (89.9 kg)  01/21/19 200 lb 6.4 oz (90.9 kg)  01/06/19 203 lb 3.2 oz (92.2 kg)   General Appearance: Well nourished, in no apparent distress. Eyes: PERRLA, EOMs, conjunctiva no swelling or erythema Sinuses: No Frontal/maxillary tenderness ENT/Mouth:  Hearing normal.  Wearing mask.  Respiratory: Respiratory effort normal, BS equal bilaterally without rales, rhonchi, wheezing or stridor.  Cardio: RRR with no MRGs. Brisk peripheral pulses without edema.  Abdomen: Soft, + BS.  Non tender, no guarding, rebound, hernias, masses. Lymphatics: Non tender without lymphadenopathy.  Musculoskeletal: Full ROM, 5/5 strength, Normal gait Skin: Warm, dry without rashes, lesions, ecchymosis.  Neuro:  Cranial nerves intact. No cerebellar symptoms.  Psych: Awake and oriented X 3, normal affect, Insight and Judgment appropriate.    Garnet Sierras, NP [facility] 12:52 PM

## 2019-02-03 ENCOUNTER — Encounter: Payer: Self-pay | Admitting: Adult Health Nurse Practitioner

## 2019-02-03 ENCOUNTER — Ambulatory Visit: Payer: 59 | Admitting: Adult Health Nurse Practitioner

## 2019-02-03 ENCOUNTER — Other Ambulatory Visit: Payer: Self-pay

## 2019-02-03 VITALS — BP 118/82 | HR 59 | Temp 97.3°F | Ht 67.5 in | Wt 198.2 lb

## 2019-02-03 DIAGNOSIS — M170 Bilateral primary osteoarthritis of knee: Secondary | ICD-10-CM

## 2019-02-03 DIAGNOSIS — N401 Enlarged prostate with lower urinary tract symptoms: Secondary | ICD-10-CM

## 2019-02-03 DIAGNOSIS — R7309 Other abnormal glucose: Secondary | ICD-10-CM

## 2019-02-03 DIAGNOSIS — I2510 Atherosclerotic heart disease of native coronary artery without angina pectoris: Secondary | ICD-10-CM

## 2019-02-03 DIAGNOSIS — E559 Vitamin D deficiency, unspecified: Secondary | ICD-10-CM | POA: Diagnosis not present

## 2019-02-03 DIAGNOSIS — E349 Endocrine disorder, unspecified: Secondary | ICD-10-CM

## 2019-02-03 DIAGNOSIS — Z79899 Other long term (current) drug therapy: Secondary | ICD-10-CM

## 2019-02-03 DIAGNOSIS — I10 Essential (primary) hypertension: Secondary | ICD-10-CM | POA: Diagnosis not present

## 2019-02-03 DIAGNOSIS — E782 Mixed hyperlipidemia: Secondary | ICD-10-CM

## 2019-02-03 DIAGNOSIS — N1831 Chronic kidney disease, stage 3a: Secondary | ICD-10-CM

## 2019-02-03 DIAGNOSIS — F419 Anxiety disorder, unspecified: Secondary | ICD-10-CM

## 2019-02-03 DIAGNOSIS — G8929 Other chronic pain: Secondary | ICD-10-CM

## 2019-02-03 DIAGNOSIS — Z6830 Body mass index (BMI) 30.0-30.9, adult: Secondary | ICD-10-CM

## 2019-02-03 DIAGNOSIS — N138 Other obstructive and reflux uropathy: Secondary | ICD-10-CM

## 2019-02-03 DIAGNOSIS — K219 Gastro-esophageal reflux disease without esophagitis: Secondary | ICD-10-CM

## 2019-02-03 DIAGNOSIS — Z955 Presence of coronary angioplasty implant and graft: Secondary | ICD-10-CM

## 2019-02-03 DIAGNOSIS — M545 Low back pain, unspecified: Secondary | ICD-10-CM

## 2019-02-03 DIAGNOSIS — E669 Obesity, unspecified: Secondary | ICD-10-CM

## 2019-02-03 MED ORDER — TESTOSTERONE CYPIONATE 200 MG/ML IM SOLN: INTRAMUSCULAR | 3 refills | Status: DC

## 2019-02-03 NOTE — Patient Instructions (Addendum)
We will respond with your lab results in 1-3 days via [place].   Contact [facility] to ask about Shingrix vaccination. Ask insurance and pharmacy about shingrix - new vaccine   Can go to [email or web address] for more information  Shingrix Vaccination  Two vaccines are licensed and recommended to prevent shingles in the [place] Zoster vaccine live (ZVL, Zostavax) has been in use since 2006. Recombinant zoster vaccine (RZV, Shingrix), has been in use since 2017 and is recommended by ACIP as the preferred shingles vaccine.  What Everyone Should Know about Shingles Vaccine (Shingrix) One of the Recommended Vaccines by Disease Shingles vaccination is the only way to protect against shingles and postherpetic neuralgia (PHN), the most common complication from shingles. CDC recommends that healthy adults 50 years and older get two doses of the shingles vaccine called Shingrix (recombinant zoster vaccine), separated by 2 to 6 months, to prevent shingles and the complications from the disease. Your doctor or pharmacist can give you Shingrix as a shot in your upper arm. Shingrix provides strong protection against shingles and PHN. Two doses of Shingrix is more than 90% effective at preventing shingles and PHN. Protection stays above 85% for at least the first four years after you get vaccinated. Shingrix is the preferred vaccine, over Zostavax (zoster vaccine live), a shingles vaccine in use since 2006. Zostavax may still be used to prevent shingles in healthy adults 60 years and older. For example, you could use Zostavax if a person is allergic to Shingrix, prefers Zostavax, or requests immediate vaccination and Shingrix is unavailable. Who Should Get Shingrix? Healthy adults 50 years and older should get two doses of Shingrix, separated by 2 to 6 months. You should get Shingrix even if in the past you . had shingles  . received Zostavax  . are not sure if you had  chickenpox There is no maximum age for getting Shingrix. If you had shingles in the past, you can get Shingrix to help prevent future occurrences of the disease. There is no specific length of time that you need to wait after having shingles before you can receive Shingrix, but generally you should make sure the shingles rash has gone away before getting vaccinated. You can get Shingrix whether or not you remember having had chickenpox in the past. Studies show that more than 99% of Americans 40 years and older have had chickenpox, even if they don't remember having the disease. Chickenpox and shingles are related because they are caused by the same virus (varicella zoster virus). After a person recovers from chickenpox, the virus stays dormant (inactive) in the body. It can reactivate years later and cause shingles. If you had Zostavax in the recent past, you should wait at least eight weeks before getting Shingrix. Talk to your healthcare provider to determine the best time to get Shingrix. Shingrix is available in [facility] and pharmacies. To find doctor's offices or pharmacies near you that offer the vaccine, visit [facility] Vaccine FinderExternal. If you have questions about Shingrix, talk with your healthcare provider. Vaccine for Those 60 Years and Older  Shingrix reduces the risk of shingles and PHN by more than 90% in people 14 and older. CDC recommends the vaccine for healthy adults 39 and older.  Who Should Not Get Shingrix? You should not get Shingrix if you: . have ever had a severe allergic reaction to any component of the vaccine or after a dose of Shingrix  . tested negative for immunity to varicella zoster virus. If you test  negative, you should get chickenpox vaccine.  . currently have shingles  . currently are pregnant or breastfeeding. Women who are pregnant or breastfeeding should wait to get Shingrix.  Marland Kitchen receive specific antiviral drugs (acyclovir, famciclovir, or  valacyclovir) 24 hours before vaccination (avoid use of these antiviral drugs for 14 days after vaccination)- zoster vaccine live only If you have a minor acute (starts suddenly) illness, such as a cold, you may get Shingrix. But if you have a moderate or severe acute illness, you should usually wait until you recover before getting the vaccine. This includes anyone with a temperature of 101.71F or higher. The side effects of the Shingrix are temporary, and usually last 2 to 3 days. While you may experience pain for a few days after getting Shingrix, the pain will be less severe than having shingles and the complications from the disease. How Well Does Shingrix Work? Two doses of Shingrix provides strong protection against shingles and postherpetic neuralgia (PHN), the most common complication of shingles. . In adults 32 to 64 years old who got two doses, Shingrix was 97% effective in preventing shingles; among adults 70 years and older, Shingrix was 91% effective.  . In adults 94 to 64 years old who got two doses, Shingrix was 91% effective in preventing PHN; among adults 70 years and older, Shingrix was 89% effective. Shingrix protection remained high (more than 85%) in people 70 years and older throughout the four years following vaccination. Since your risk of shingles and PHN increases as you get older, it is important to have strong protection against shingles in your older years. Top of Page  What Are the Possible Side Effects of Shingrix? Studies show that Shingrix is safe. The vaccine helps your body create a strong defense against shingles. As a result, you are likely to have temporary side effects from getting the shots. The side effects may affect your ability to do normal daily activities for 2 to 3 days. Most people got a sore arm with mild or moderate pain after getting Shingrix, and some also had redness and swelling where they got the shot. Some people felt tired, had muscle pain, a  headache, shivering, fever, stomach pain, or nausea. About 1 out of 6 people who got Shingrix experienced side effects that prevented them from doing regular activities. Symptoms went away on their own in about 2 to 3 days. Side effects were more common in younger people. You might have a reaction to the first or second dose of Shingrix, or both doses. If you experience side effects, you may choose to take over-the-counter pain medicine such as ibuprofen or acetaminophen. If you experience side effects from Shingrix, you should report them to the Vaccine Adverse Event Reporting System (VAERS). Your doctor might file this report, or you can do it yourself through the VAERS websiteExternal, or by calling [phone]. If you have any questions about side effects from Shingrix, talk with your doctor. The shingles vaccine does not contain thimerosal (a preservative containing mercury). Top of Page  When Should I See a Doctor Because of the Side Effects I Experience From Shingrix? In clinical trials, Shingrix was not associated with serious adverse events. In fact, serious side effects from vaccines are extremely rare. For example, for every 1 million doses of a vaccine given, only one or two people may have a severe allergic reaction. Signs of an allergic reaction happen within minutes or hours after vaccination and include hives, swelling of the face and throat, difficulty  breathing, a fast heartbeat, dizziness, or weakness. If you experience these or any other life-threatening symptoms, see a doctor right away. Shingrix causes a strong response in your immune system, so it may produce short-term side effects more intense than you are used to from other vaccines. These side effects can be uncomfortable, but they are expected and usually go away on their own in 2 or 3 days. Top of Page  How Can I Pay For Shingrix? There are several ways shingles vaccine may be paid for: Medicare . Medicare Part D plans  cover the shingles vaccine, but there may be a cost to you depending on your plan. There may be a copay for the vaccine, or you may need to pay in full then get reimbursed for a certain amount.  . Medicare Part B does not cover the shingles vaccine. Medicaid . Medicaid may or may not cover the vaccine. Contact your insurer to find out. Private health insurance . Many private health insurance plans will cover the vaccine, but there may be a cost to you depending on your plan. Contact your insurer to find out. Vaccine assistance programs . Some pharmaceutical companies provide vaccines to eligible adults who cannot afford them. You may want to check with the vaccine manufacturer, [employer], about Shingrix. If you do not currently have health insurance, learn more about affordable health coverage optionsExternal. To find doctor's offices or pharmacies near you that offer the vaccine, visit HealthMap Vaccine FinderExternal.     Vit D  & Vit C 1,000 mg   are recommended to help protect  against the Covid-19 and other Corona viruses.    Also it's recommended  to take  Zinc 50 mg  to help  protect against the Covid-19   and best place to get  is also on [employer]  and don't pay more than 6-8 cents /pill !  ================================ Coronavirus (COVID-19) Are you at risk?  Are you at risk for the Coronavirus (COVID-19)?  To be considered HIGH RISK for Coronavirus (COVID-19), you have to meet the following criteria:  . Traveled to [place], [place], [place], [place] or [place]; or in the [place] to [place], [place], [place]  . or [place]; and have fever, cough, and shortness of breath within the last 2 weeks of travel OR . Been in close contact with a person diagnosed with COVID-19 within the last 2 weeks and have  . fever, cough,and shortness of breath .  . IF YOU DO NOT MEET THESE CRITERIA, YOU ARE CONSIDERED LOW RISK FOR COVID-19.  What to do if you are HIGH  RISK for COVID-19?  Marland Kitchen If you are having a medical emergency, call 911. . Seek medical care right away. Before you go to a doctor's office, urgent care or emergency department, .  call ahead and tell them about your recent travel, contact with someone diagnosed with COVID-19  .  and your symptoms.  . You should receive instructions from your physician's office regarding next steps of care.  . When you arrive at healthcare provider, tell the healthcare staff immediately you have returned from  . visiting [place], [place], [place], [place] or [place]; or traveled in the [place] to [place], [place],  . [place] or [place] in the last two weeks or you have been in close contact with a person diagnosed with  . COVID-19 in the last 2 weeks.   . Tell the health care staff about your symptoms: fever,  cough and shortness of breath. . After you have been seen by a medical provider, you will be either: o Tested for (COVID-19) and discharged home on quarantine except to seek medical care if  o symptoms worsen, and asked to  - Stay home and avoid contact with others until you get your results (4-5 days)  - Avoid travel on public transportation if possible (such as bus, train, or airplane) or o Sent to the Emergency Department by EMS for evaluation, COVID-19 testing  and  o possible admission depending on your condition and test results.  What to do if you are LOW RISK for COVID-19?  Reduce your risk of any infection by using the same precautions used for avoiding the common cold or flu:  Marland Kitchen Wash your hands often with soap and warm water for at least 20 seconds.  If soap and water are not readily available,  . use an alcohol-based hand sanitizer with at least 60% alcohol.  . If coughing or sneezing, cover your mouth and nose by coughing or sneezing into the elbow areas of your shirt or coat, .  into a tissue or into your sleeve (not your hands). . Avoid shaking hands with others and consider  head nods or verbal greetings only. . Avoid touching your eyes, nose, or mouth with unwashed hands.  . Avoid close contact with people who are sick. . Avoid places or events with large numbers of people in one location, like concerts or sporting events. . Carefully consider travel plans you have or are making. . If you are planning any travel outside or inside the [place], visit the CDC's Travelers' Health webpage for the latest health notices. . If you have some symptoms but not all symptoms, continue to monitor at home and seek medical attention  . if your symptoms worsen. . If you are having a medical emergency, call 911. >>>>>>>>>>>>>>>>>>>>>>>>>>>>>>>>>>>>>>>>>>>>>>>>>>>>>>> We Do NOT Approve of  [facility] [place] Soliciting Our Patients  To Do Home Visits  & We Do NOT Approve of [employer] > > > > > > > > > > > > > > > > > > > > > > > > > > > > > > > > > > >  > > > >   Preventive Care for Adults  A healthy lifestyle and preventive care can promote health and wellness. Preventive health guidelines for men include the following key practices:  A routine yearly physical is a good way to check with your health care provider about your health and preventative screening. It is a chance to share any concerns and updates on your health and to receive a thorough exam.  Visit your dentist for a routine exam and preventative care every 6 months. Brush your teeth twice a day and floss once a day. Good oral hygiene prevents tooth decay and gum disease.  The frequency of eye exams is based on your age, health, family medical history, use of contact lenses, and other factors. Follow your health care provider's recommendations for frequency of eye exams.  Eat a healthy diet. Foods such as vegetables, fruits, whole grains, low-fat dairy products, and lean protein foods contain the nutrients you need without too many calories. Decrease your intake of foods high in solid fats, added  sugars, and salt. Eat the right amount of calories for you. Get information about a proper diet from your health care provider, if necessary.  Regular physical exercise is one of the most important  things you can do for your health. Most adults should get at least 150 minutes of moderate-intensity exercise (any activity that increases your heart rate and causes you to sweat) each week. In addition, most adults need muscle-strengthening exercises on 2 or more days a week.  Maintain a healthy weight. The body mass index (BMI) is a screening tool to identify possible weight problems. It provides an estimate of body fat based on height and weight. Your health care provider can find your BMI and can help you achieve or maintain a healthy weight. For adults 20 years and older:  A BMI below 18.5 is considered underweight.  A BMI of 18.5 to 24.9 is normal.  A BMI of 25 to 29.9 is considered overweight.  A BMI of 30 and above is considered obese.  Maintain normal blood lipids and cholesterol levels by exercising and minimizing your intake of saturated fat. Eat a balanced diet with plenty of fruit and vegetables. Blood tests for lipids and cholesterol should begin at age 2 and be repeated every 5 years. If your lipid or cholesterol levels are high, you are over 50, or you are at high risk for heart disease, you may need your cholesterol levels checked more frequently. Ongoing high lipid and cholesterol levels should be treated with medicines if diet and exercise are not working.  If you smoke, find out from your health care provider how to quit. If you do not use tobacco, do not start.  Lung cancer screening is recommended for adults aged 78-80 years who are at high risk for developing lung cancer because of a history of smoking. A yearly low-dose CT scan of the lungs is recommended for people who have at least a 30-pack-year history of smoking and are a current smoker or have quit within the past 15 years.  A pack year of smoking is smoking an average of 1 pack of cigarettes a day for 1 year (for example: 1 pack a day for 30 years or 2 packs a day for 15 years). Yearly screening should continue until the smoker has stopped smoking for at least 15 years. Yearly screening should be stopped for people who develop a health problem that would prevent them from having lung cancer treatment.  If you choose to drink alcohol, do not have more than 2 drinks per day. One drink is considered to be 12 ounces (355 mL) of beer, 5 ounces (148 mL) of wine, or 1.5 ounces (44 mL) of liquor.  Avoid use of street drugs. Do not share needles with anyone. Ask for help if you need support or instructions about stopping the use of drugs.  High blood pressure causes heart disease and increases the risk of stroke. Your blood pressure should be checked at least every 1-2 years. Ongoing high blood pressure should be treated with medicines, if weight loss and exercise are not effective.  If you are 85-34 years old, ask your health care provider if you should take aspirin to prevent heart disease.  Diabetes screening involves taking a blood sample to check your fasting blood sugar level. Testing should be considered at a younger age or be carried out more frequently if you are overweight and have at least 1 risk factor for diabetes.  Colorectal cancer can be detected and often prevented. Most routine colorectal cancer screening begins at the age of 47 and continues through age 68. However, your health care provider may recommend screening at an earlier age if you have risk factors  for colon cancer. On a yearly basis, your health care provider may provide home test kits to check for hidden blood in the stool. Use of a small camera at the end of a tube to directly examine the colon (sigmoidoscopy or colonoscopy) can detect the earliest forms of colorectal cancer. Talk to your health care provider about this at age 56, when routine  screening begins. Direct exam of the colon should be repeated every 5-10 years through age 21, unless early forms of precancerous polyps or small growths are found.  Hepatitis C blood testing is recommended for all people born from [birth year] through 1966 and any individual with known risks for hepatitis C.  Screening for abdominal aortic aneurysm (AAA)  by ultrasound is recommended for people who have history of high blood pressure or who are current or former smokers.  Healthy men should  receive prostate-specific antigen (PSA) blood tests as part of routine cancer screening. Talk with your health care provider about prostate cancer screening.  Testicular cancer screening is  recommended for adult males. Screening includes self-exam, a health care provider exam, and other screening tests. Consult with your health care provider about any symptoms you have or any concerns you have about testicular cancer.  Use sunscreen. Apply sunscreen liberally and repeatedly throughout the day. You should seek shade when your shadow is shorter than you. Protect yourself by wearing long sleeves, pants, a wide-brimmed hat, and sunglasses year round, whenever you are outdoors.  Once a month, do a whole-body skin exam, using a mirror to look at the skin on your back. Tell your health care provider about new moles, moles that have irregular borders, moles that are larger than a pencil eraser, or moles that have changed in shape or color.  Stay current with required vaccines (immunizations).  Influenza vaccine. All adults should be immunized every year.  Tetanus, diphtheria, and acellular pertussis (Td, Tdap) vaccine. An adult who has not previously received Tdap or who does not know his vaccine status should receive 1 dose of Tdap. This initial dose should be followed by tetanus and diphtheria toxoids (Td) booster doses every 10 years. Adults with an unknown or incomplete history of completing a 3-dose immunization series  with Td-containing vaccines should begin or complete a primary immunization series including a Tdap dose. Adults should receive a Td booster every 10 years.  Zoster vaccine. One dose is recommended for adults aged 50 years or older unless certain conditions are present.    PREVNAR - Pneumococcal 13-valent conjugate (PCV13) vaccine. When indicated, a person who is uncertain of his immunization history and has no record of immunization should receive the PCV13 vaccine. An adult aged 39 years or older who has certain medical conditions and has not been previously immunized should receive 1 dose of PCV13 vaccine. This PCV13 should be followed with a dose of pneumococcal polysaccharide (PPSV23) vaccine. The PPSV23 vaccine dose should be obtained 1 or more year(s)after the dose of PCV13 vaccine. An adult aged 78 years or older who has certain medical conditions and previously received 1 or more doses of PPSV23 vaccine should receive 1 dose of PCV13. The PCV13 vaccine dose should be obtained 1 or more years after the last PPSV23 vaccine dose.    PNEUMOVAX - Pneumococcal polysaccharide (PPSV23) vaccine. When PCV13 is also indicated, PCV13 should be obtained first. All adults aged 35 years and older should be immunized. An adult younger than age 49 years who has certain medical conditions should be immunized. Any  person who resides in a nursing home or long-term care facility should be immunized. An adult smoker should be immunized. People with an immunocompromised condition and certain other conditions should receive both PCV13 and PPSV23 vaccines. People with human immunodeficiency virus (HIV) infection should be immunized as soon as possible after diagnosis. Immunization during chemotherapy or radiation therapy should be avoided. Routine use of PPSV23 vaccine is not recommended for American Indians, [place] Natives, or people younger than 65 years unless there are medical conditions that require PPSV23 vaccine.  When indicated, people who have unknown immunization and have no record of immunization should receive PPSV23 vaccine. One-time revaccination 5 years after the first dose of PPSV23 is recommended for people aged 19-64 years who have chronic kidney failure, nephrotic syndrome, asplenia, or immunocompromised conditions. People who received 1-2 doses of PPSV23 before age 19 years should receive another dose of PPSV23 vaccine at age 91 years or later if at least 5 years have passed since the previous dose. Doses of PPSV23 are not needed for people immunized with PPSV23 at or after age 39 years.    Hepatitis A vaccine. Adults who wish to be protected from this disease, have certain high-risk conditions, work with hepatitis A-infected animals, work in hepatitis A research labs, or travel to or work in countries with a high rate of hepatitis A should be immunized. Adults who were previously unvaccinated and who anticipate close contact with an international adoptee during the first 60 days after arrival in the [place] from a country with a high rate of hepatitis A should be immunized.    Hepatitis B vaccine. Adults should be immunized if they wish to be protected from this disease, have certain high-risk conditions, may be exposed to blood or other infectious body fluids, are household contacts or sex partners of hepatitis B positive people, are clients or workers in certain care facilities, or travel to or work in countries with a high rate of hepatitis B.   Preventive Service / Frequency   Ages 30 and over  Blood pressure check.  Lipid and cholesterol check.  Lung cancer screening. / Every year if you are aged 34-80 years and have a 30-pack-year history of smoking and currently smoke or have quit within the past 15 years. Yearly screening is stopped once you have quit smoking for at least 15 years or develop a health problem that would prevent you from having lung cancer treatment.  Fecal  occult blood test (FOBT) of stool. You may not have to do this test if you get a colonoscopy every 10 years.  Flexible sigmoidoscopy** or colonoscopy.** / Every 5 years for a flexible sigmoidoscopy or every 10 years for a colonoscopy beginning at age 57 and continuing until age 56.  Hepatitis C blood test.** / For all people born from [birth year] through 1966 and any individual with known risks for hepatitis C.  Abdominal aortic aneurysm (AAA) screening./ Screening current or former smokers or have Hypertension.  Skin self-exam. / Monthly.  Influenza vaccine. / Every year.  Tetanus, diphtheria, and acellular pertussis (Tdap/Td) vaccine.** / 1 dose of Td every 10 years.   Zoster vaccine.** / 1 dose for adults aged 49 years or older.         Pneumococcal 13-valent conjugate (PCV13) vaccine.    Pneumococcal polysaccharide (PPSV23) vaccine.     Hepatitis A vaccine.** / Consult your health care provider.  Hepatitis B vaccine.** / Consult your health care provider. Screening for abdominal aortic aneurysm (AAA)  by ultrasound is recommended for people who have history of high blood pressure or who are current or former smokers. ++++++++++ Recommend Adult Low Dose Aspirin or  coated  Aspirin 81 mg daily  To reduce risk of Colon Cancer 40 %,  Skin Cancer 26 % ,  Malignant Melanoma 46%  and  Pancreatic cancer 60% ++++++++++++++++++++++ Vitamin D goal  is between 70-100.  Please make sure that you are taking your Vitamin D as directed.  It is very important as a natural anti-inflammatory  helping hair, skin, and nails, as well as reducing stroke and heart attack risk.  It helps your bones and helps with mood. It also decreases numerous cancer risks so please take it as directed.  Low Vit D is associated with a 200-300% higher risk for CANCER  and 200-300% higher risk for HEART   ATTACK  &  STROKE.   .....................................Marland Kitchen It is also associated with higher death rate at  younger ages,  autoimmune diseases like Rheumatoid arthritis, Lupus, Multiple Sclerosis.    Also many other serious conditions, like depression, Alzheimer's Dementia, infertility, muscle aches, fatigue, fibromyalgia - just to name a few. ++++++++++++++++++++++ Recommend the book "The END of DIETING" by Dr Excell Seltzer  & the book "The END of DIABETES " by Dr Excell Seltzer At [email or web address] - get book & Audio CD's    Being diabetic has a  300% increased risk for heart attack, stroke, cancer, and alzheimer- type vascular dementia. It is very important that you work harder with diet by avoiding all foods that are white. Avoid white rice (brown & wild rice is OK), white potatoes (sweetpotatoes in moderation is OK), White bread or wheat bread or anything made out of white flour like bagels, donuts, rolls, buns, biscuits, cakes, pastries, cookies, pizza crust, and pasta (made from white flour & egg whites) - vegetarian pasta or spinach or wheat pasta is OK. Multigrain breads like Arnold's or Pepperidge Farm, or multigrain sandwich thins or flatbreads.  Diet, exercise and weight loss can reverse and cure diabetes in the early stages.  Diet, exercise and weight loss is very important in the control and prevention of complications of diabetes which affects every system in your body, ie. Brain - dementia/stroke, eyes - glaucoma/blindness, heart - heart attack/heart failure, kidneys - dialysis, stomach - gastric paralysis, intestines - malabsorption, nerves - severe painful neuritis, circulation - gangrene & loss of a leg(s), and finally cancer and Alzheimers.    I recommend avoid fried & greasy foods,  sweets/candy, white rice (brown or wild rice or Quinoa is OK), white potatoes (sweet potatoes are OK) - anything made from white flour - bagels, doughnuts, rolls, buns, biscuits,white and wheat breads, pizza crust and traditional pasta made of white flour & egg white(vegetarian pasta or spinach or wheat pasta is OK).   Multi-grain bread is OK - like multi-grain flat bread or sandwich thins. Avoid alcohol in excess. Exercise is also important.    Eat all the vegetables you want - avoid meat, especially red meat and dairy - especially cheese.  Cheese is the most concentrated form of trans-fats which is the worst thing to clog up our arteries. Veggie cheese is OK which can be found in the fresh produce section at [employer] or [employer] or [employer]  ++++++++++++++++++++++ DASH Eating Plan  DASH stands for "Dietary Approaches to Stop Hypertension."   The DASH eating plan is a healthy eating plan that has been shown to reduce high blood pressure (hypertension).  Additional health benefits may include reducing the risk of type 2 diabetes mellitus, heart disease, and stroke. The DASH eating plan may also help with weight loss. WHAT DO I NEED TO KNOW ABOUT THE DASH EATING PLAN? For the DASH eating plan, you will follow these general guidelines:  Choose foods with a percent daily value for sodium of less than 5% (as listed on the food label).  Use salt-free seasonings or herbs instead of table salt or sea salt.  Check with your health care provider or pharmacist before using salt substitutes.  Eat lower-sodium products, often labeled as "lower sodium" or "no salt added."  Eat fresh foods.  Eat more vegetables, fruits, and low-fat dairy products.  Choose whole grains. Look for the word "whole" as the first word in the ingredient list.  Choose fish   Limit sweets, desserts, sugars, and sugary drinks.  Choose heart-healthy fats.  Eat veggie cheese   Eat more home-cooked food and less restaurant, buffet, and fast food.  Limit fried foods.  Cook foods using methods other than frying.  Limit canned vegetables. If you do use them, rinse them well to decrease the sodium.  When eating at a restaurant, ask that your food be prepared with less salt, or no salt if possible.                      WHAT  FOODS CAN I EAT? Read Dr Fara Olden Fuhrman's books on The End of Dieting & The End of Diabetes  Grains Whole grain or whole wheat bread. Brown rice. Whole grain or whole wheat pasta. Quinoa, bulgur, and whole grain cereals. Low-sodium cereals. Corn or whole wheat flour tortillas. Whole grain cornbread. Whole grain crackers. Low-sodium crackers.  Vegetables Fresh or frozen vegetables (raw, steamed, roasted, or grilled). Low-sodium or reduced-sodium tomato and vegetable juices. Low-sodium or reduced-sodium tomato sauce and paste. Low-sodium or reduced-sodium canned vegetables.   Fruits All fresh, canned (in natural juice), or frozen fruits.  Protein Products  All fish and seafood.  Dried beans, peas, or lentils. Unsalted nuts and seeds. Unsalted canned beans.  Dairy Low-fat dairy products, such as skim or 1% milk, 2% or reduced-fat cheeses, low-fat ricotta or cottage cheese, or plain low-fat yogurt. Low-sodium or reduced-sodium cheeses.  Fats and Oils Tub margarines without trans fats. Light or reduced-fat mayonnaise and salad dressings (reduced sodium). Avocado. Safflower, olive, or canola oils. Natural peanut or almond butter.  Other Unsalted popcorn and pretzels. The items listed above may not be a complete list of recommended foods or beverages. Contact your dietitian for more options.  ++++++++++++++++++++  WHAT FOODS ARE NOT RECOMMENDED? Grains/ White flour or wheat flour White bread. White pasta. White rice. Refined cornbread. Bagels and croissants. Crackers that contain trans fat.  Vegetables  Creamed or fried vegetables. Vegetables in a . Regular canned vegetables. Regular canned tomato sauce and paste. Regular tomato and vegetable juices.  Fruits Dried fruits. Canned fruit in light or heavy syrup. Fruit juice.  Meat and Other Protein Products Meat in general - RED meat & White meat.  Fatty cuts of meat. Ribs, chicken wings, all processed meats as bacon, sausage, bologna,  salami, fatback, hot dogs, bratwurst and packaged luncheon meats.  Dairy Whole or 2% milk, cream, half-and-half, and cream cheese. Whole-fat or sweetened yogurt. Full-fat cheeses or blue cheese. Non-dairy creamers and whipped toppings. Processed cheese, cheese spreads, or cheese curds.  Condiments Onion and garlic salt, seasoned salt, table salt, and sea salt. Canned  and packaged gravies. Worcestershire sauce. Tartar sauce. Barbecue sauce. Teriyaki sauce. Soy sauce, including reduced sodium. Steak sauce. Fish sauce. Oyster sauce. Cocktail sauce. Horseradish. Ketchup and mustard. Meat flavorings and tenderizers. Bouillon cubes. Hot sauce. Tabasco sauce. Marinades. Taco seasonings. Relishes.  Fats and Oils Butter, stick margarine, lard, shortening and bacon fat. Coconut, palm kernel, or palm oils. Regular salad dressings.  Pickles and olives. Salted popcorn and pretzels.  The items listed above may not be a complete list of foods and beverages to avoid.

## 2019-02-03 NOTE — Progress Notes (Signed)
[facility] PREP Weekly Session   Patient Details  Name: Marvin Carter MRN: [id number] Date of Birth: 03/12/1955 Age: 63 y.o. PCP: Unk Pinto, MD  Vitals:   02/03/19 1820  Weight: 196 lb (88.9 kg)    [facility] Weekly seesion - 02/03/19 1800      Weekly Session   Topic Discussed  Stress management and problem solving    Minutes exercised this week  75 minutes   Cardio 60, strength 15      Fun things you did since last meeting: made soup Things you are grateful for: health Nutrition celebrations: chicken noodle soup  Barnett Hatter 02/03/2019, 6:21 PM

## 2019-02-04 LAB — HEMOGLOBIN A1C
Hgb A1c MFr Bld: 5.4 % of total Hgb (ref <5.7)
Mean Plasma Glucose: 108 (calc)
eAG (mmol/L): 6 (calc)

## 2019-02-04 LAB — COMPLETE METABOLIC PANEL WITH GFR
AG Ratio: 1.8 (calc) (ref 1.0–2.5)
ALT: 36 U/L (ref 9–46)
AST: 40 U/L — ABNORMAL HIGH (ref 10–35)
Albumin: 4.4 g/dL (ref 3.6–5.1)
Alkaline phosphatase (APISO): 38 U/L (ref 35–144)
BUN: 19 mg/dL (ref 7–25)
CO2: 26 mmol/L (ref 20–32)
Calcium: 9.7 mg/dL (ref 8.6–10.3)
Chloride: 101 mmol/L (ref 98–110)
Creat: 1.24 mg/dL (ref 0.70–1.25)
GFR, Est African American: 71 mL/min/{1.73_m2} (ref >=60)
GFR, Est Non African American: 61 mL/min/{1.73_m2} (ref >=60)
Globulin: 2.5 g/dL (calc) (ref 1.9–3.7)
Glucose, Bld: 93 mg/dL (ref 65–99)
Potassium: 3.8 mmol/L (ref 3.5–5.3)
Sodium: 140 mmol/L (ref 135–146)
Total Bilirubin: 0.8 mg/dL (ref 0.2–1.2)
Total Protein: 6.9 g/dL (ref 6.1–8.1)

## 2019-02-04 LAB — LIPID PANEL
Cholesterol: 163 mg/dL
HDL: 79 mg/dL
LDL Cholesterol (Calc): 70 mg/dL
Non-HDL Cholesterol (Calc): 84 mg/dL
Total CHOL/HDL Ratio: 2.1 (calc)
Triglycerides: 60 mg/dL

## 2019-02-04 LAB — VITAMIN D 25 HYDROXY (VIT D DEFICIENCY, FRACTURES): Vit D, 25-Hydroxy: 87 ng/mL (ref 30–100)

## 2019-02-04 LAB — TSH: TSH: 1.04 mIU/L (ref 0.40–4.50)

## 2019-02-04 LAB — MAGNESIUM: Magnesium: 1.5 mg/dL (ref 1.5–2.5)

## 2019-02-11 NOTE — Progress Notes (Signed)
[facility] PREP Weekly Session   Patient Details  Name: Marvin Carter MRN: [id number] Date of Birth: 1955/11/15 Age: 63 y.o. PCP: Unk Pinto, MD  Vitals:   02/10/19 1430  Weight: 196 lb 12.8 oz (89.3 kg)    [facility] Weekly seesion - 02/11/19 0600      Weekly Session   Topic Discussed  --   half cardio/half strength training day    Minutes exercised this week  60 minutes   30 min of cardio, 30 min of strength     Fun things you did since last meeting: rode to [place] you are grateful for: health Nutrition celebration for the week: staying in my weight Barriers/struggles: back   Barnett Hatter 02/11/2019, 6:54 AM

## 2019-02-18 ENCOUNTER — Other Ambulatory Visit: Payer: Self-pay | Admitting: Internal Medicine

## 2019-02-18 MED ORDER — CYCLOBENZAPRINE HCL 10 MG PO TABS: ORAL_TABLET | ORAL | 3 refills | Status: DC

## 2019-02-20 HISTORY — PX: ELBOW SURGERY: SHX618

## 2019-02-20 HISTORY — PX: CARPAL TUNNEL RELEASE: SHX101

## 2019-02-26 ENCOUNTER — Other Ambulatory Visit: Payer: Self-pay | Admitting: Internal Medicine

## 2019-02-26 MED ORDER — DEXAMETHASONE 4 MG PO TABS: ORAL_TABLET | ORAL | 0 refills | Status: DC

## 2019-02-26 MED ORDER — AZITHROMYCIN 250 MG PO TABS: ORAL_TABLET | ORAL | 0 refills | Status: DC

## 2019-03-25 ENCOUNTER — Other Ambulatory Visit: Payer: Self-pay | Admitting: Internal Medicine

## 2019-03-25 MED ORDER — CYCLOBENZAPRINE HCL 10 MG PO TABS: ORAL_TABLET | ORAL | 3 refills | Status: DC

## 2019-04-01 ENCOUNTER — Other Ambulatory Visit: Payer: Self-pay | Admitting: Internal Medicine

## 2019-04-01 DIAGNOSIS — F419 Anxiety disorder, unspecified: Secondary | ICD-10-CM

## 2019-04-01 MED ORDER — ALPRAZOLAM 1 MG PO TABS: ORAL_TABLET | ORAL | 0 refills | Status: DC

## 2019-04-03 NOTE — Progress Notes (Signed)
[facility]   Patient Details  Name: Wilgus Sween MRN: [id number] Date of Birth: [birth year] Age: 64 y.o. PCP: Unk Pinto, MD  Vitals:   04/03/19 1221  BP: 122/78  Pulse: 76  Weight: 193 lb 12.8 oz (87.9 kg)  Height: 5\' 8"  (1.727 m)     [facility] Eval - 04/03/19 1200      Measurement   Neck measurement  16.5 Inches    Waist Circumference  39.2 inches    Body fat  25.2 percent      Information for Trainer   Goals  Keep exercising and be dedicated       Mobility and Daily Activities   I find it easy to walk up or down two or more flights of stairs.  4    I have no trouble taking out the trash.  4    I do housework such as vacuuming and dusting on my own without difficulty.  4    I can easily lift a gallon of milk (8lbs).  4    I can easily walk a mile.  4    I have no trouble reaching into high cupboards or reaching down to pick up something from the floor.  4    I do not have trouble doing out-door work such as Armed forces logistics/support/administrative officer, raking leaves, or gardening.  4      Mobility and Daily Activities   I feel younger than my age.  3    I feel independent.  4    I feel energetic.  3    I live an active life.   3    I feel strong.  3    I feel healthy.  4    I feel active as other people my age.  3      How fit and strong are you.   Fit and Strong Total Score  51      Past Medical History:  Diagnosis Date  . Anxiety   . Arthritis   . Atrial flutter, paroxysmal ([place]) 01/04/2011  . CAD (coronary artery disease), native coronary artery 01/04/2011  . Diverticulitis   . Diverticulosis   . GERD (gastroesophageal reflux disease)   . Hemarthrosis of knee, right 01/04/2011  . Hemorrhoids   . History of meniscectomy of right knee 01/02/2011  . Hyperlipemia 01/04/2011  . Hyperlipidemia   . Hypertension   . Meniscus tear 01/02/2011  . NSVT (nonsustained ventricular tachycardia) ([place]) 01/02/2011  . Primary osteoarthritis of right knee 04/03/2017   . Right knee pain 01/02/2011  . Stented coronary artery 01/04/2011   LAD DES [place].  . Torn medial meniscus    right knee   Past Surgical History:  Procedure Laterality Date  . APPENDECTOMY    . COLONOSCOPY    . CORONARY ANGIOPLASTY WITH STENT PLACEMENT  2012  . HAND SURGERY     right  . KNEE ARTHROSCOPY     x 8 left  . KNEE ARTHROSCOPY Right 04/21/2012   Procedure: ARTHROSCOPY KNEE;  Surgeon: Lorn Junes, MD;  Location: [facility];  Service: Orthopedics;  Laterality: Right;  medial and lateral meniscectomies  . LEFT HEART CATHETERIZATION WITH CORONARY ANGIOGRAM N/A 01/03/2011   Procedure: LEFT HEART CATHETERIZATION WITH CORONARY ANGIOGRAM;  Surgeon: Lorretta Harp, MD;  Location: [facility] CATH LAB;  Service: Cardiovascular;  Laterality: N/A;  . PERCUTANEOUS CORONARY STENT INTERVENTION (PCI-S) N/A 01/03/2011   Procedure: PERCUTANEOUS CORONARY STENT INTERVENTION (PCI-S);  Surgeon:  Lorretta Harp, MD;  Location: [facility] CATH LAB;  Service: Cardiovascular;  Laterality: N/A;  . ROTATOR CUFF REPAIR     bilateral  . TONSILLECTOMY    . TOTAL KNEE ARTHROPLASTY Right 04/15/2017  . TOTAL KNEE ARTHROPLASTY Right 04/15/2017   Procedure: TOTAL KNEE ARTHROPLASTY;  Surgeon: Elsie Saas, MD;  Location: [facility];  Service: Orthopedics;  Laterality: Right;  . TRICEPS TENDON REPAIR Bilateral    Social History   Tobacco Use  Smoking Status Never Smoker  Smokeless Tobacco Never Used    Final Fit Test for PREP Improvements: Weight Blood pressure Balance Cardio march test Sit to stand Bicep curls  Encouraged to continue exercising. Considering changing gyms and pursuing best cost option.    Barnett Hatter 04/03/2019, 12:25 PM

## 2019-04-16 ENCOUNTER — Other Ambulatory Visit: Payer: Self-pay | Admitting: Internal Medicine

## 2019-04-16 DIAGNOSIS — E782 Mixed hyperlipidemia: Secondary | ICD-10-CM

## 2019-04-16 MED ORDER — ATORVASTATIN CALCIUM 40 MG PO TABS: ORAL_TABLET | ORAL | 3 refills | Status: DC

## 2019-04-21 ENCOUNTER — Other Ambulatory Visit: Payer: Self-pay | Admitting: Internal Medicine

## 2019-04-21 MED ORDER — BUPROPION HCL ER (XL) 300 MG PO TB24: ORAL_TABLET | ORAL | 0 refills | Status: DC

## 2019-05-02 ENCOUNTER — Other Ambulatory Visit: Payer: Self-pay | Admitting: Internal Medicine

## 2019-05-02 DIAGNOSIS — I10 Essential (primary) hypertension: Secondary | ICD-10-CM

## 2019-05-02 MED ORDER — OLMESARTAN MEDOXOMIL 40 MG PO TABS: ORAL_TABLET | ORAL | 3 refills | Status: DC

## 2019-05-07 ENCOUNTER — Encounter: Payer: Self-pay | Admitting: Internal Medicine

## 2019-05-07 NOTE — Patient Instructions (Signed)

## 2019-05-07 NOTE — Progress Notes (Signed)
Annual  Screening/Preventative Visit  & Comprehensive Evaluation & Examination     This very nice 64 y.o. MWM  presents for a Screening /Preventative Visit & comprehensive evaluation and management of multiple medical co-morbidities.  Patient has been followed for HTN, HLD, Prediabetes and Vitamin D Deficiency.     HTN predates circa 1989. Patient's BP has been controlled at home.  Today's BP is at goal - 118/80.  In 2009, patient had a Negative /Normal Heart Cath. Then in 2012, patient presented with ACS with PCA and DES planted.  Patient also has hx/o CKD improved CKD2 (GFR 61)   Patient denies any cardiac symptoms as chest pain, palpitations, shortness of breath, dizziness or ankle swelling.     Patient's hyperlipidemia is controlled with diet and medications. Patient denies myalgias or other medication SE's. Last lipids were at goal:  Lab Results  Component Value Date   CHOL 163 02/03/2019   HDL 79 02/03/2019   LDLCALC 70 02/03/2019   TRIG 60 02/03/2019   CHOLHDL 2.1 02/03/2019       Patient has hx/o prediabetes (A1c 5.8% / 2008)  and patient denies reactive hypoglycemic symptoms, visual blurring, diabetic polys or paresthesias. Last A1c was Normal & at goal:  Lab Results  Component Value Date   HGBA1C 5.4 02/03/2019        Finally, patient has history of Vitamin D Deficiency ("19" / 2009)  and last vitamin D was at goal:  Lab Results  Component Value Date   VD25OH 87 02/03/2019    Current Outpatient Medications on File Prior to Visit  Medication Sig  . acetaminophen (TYLENOL) 325 MG tablet Take 2 tablets (650 mg total) by mouth every 4 (four) hours as needed for mild pain ((score 1 to 3) or temp > 100.5).  Marland Kitchen ALPRAZolam (XANAX) 1 MG tablet Take 1/2-1 tablet ONLY if needed for Sleep &  limit to 5 days /week to avoid Addiction & Dementia  . Ascorbic Acid (VITAMIN C PO) Take 1 tablet by mouth daily as needed (for immune system support).  . ASPIRIN 81 PO Take 81 mg by mouth  daily.   Marland Kitchen atenolol (TENORMIN) 100 MG tablet Take 1 tablet Daily for BP  . atorvastatin (LIPITOR) 40 MG tablet Take 1 tablet Daily for Cholesterol  . buPROPion (WELLBUTRIN XL) 300 MG 24 hr tablet Take 1 tablet Daily for Mood, Focus & Concentration  . clopidogrel (PLAVIX) 75 MG tablet Take 1 tablet Daily to prevent Blood Clots  . diclofenac sodium (VOLTAREN) 1 % GEL Apply 2 to 4 grams 2 to 4 x / day  . diltiazem (CARTIA XT) 240 MG 24 hr capsule Take 1 capsule Daily for BP  . doxazosin (CARDURA) 8 MG tablet Take 1 tablet Daily for BP & Prostate  . furosemide (LASIX) 80 MG tablet Take 1 tablet daily for BP & Fluid Retention / Ankle Swelling  . meloxicam (MOBIC) 15 MG tablet Take 1/2 to 1 tablet Daily with Food for Pain & Inflammation & limit to 4-5 days /week  to avoid kidney damage  . Multiple Vitamin (MULTIVITAMIN WITH MINERALS) TABS Take 2 tablets by mouth daily. Mega Men Multivitamin.  . nitroGLYCERIN (NITROSTAT) 0.4 MG SL tablet Dissolve 1 tablet under tongue every 3 to 5 minutes as needed for Chest Pain  . olmesartan (BENICAR) 40 MG tablet Take 1/2  to 1 tablet Daily for BP  . pantoprazole (PROTONIX) 40 MG tablet Take 1 tablet Daily for Acid Indigestion & Heart Burn  .  tadalafil (CIALIS) 20 MG tablet Take 1/2 to 1 tablet every 2 to 3 days as needed for XXXX  . tamsulosin (FLOMAX) 0.4 MG CAPS capsule Take 1 capsule at Bedtime for Prostate / Urine Flow  . testosterone cypionate (DEPOTESTOSTERONE CYPIONATE) 200 MG/ML injection Inject 2 ml IM every 2 weeks  . cyclobenzaprine (FLEXERIL) 10 MG tablet Take 1/2 to 1 tablet 3 x /day as needed for Muscle Spasm (Patient not taking: Reported on 05/08/2019)  . famotidine (PEPCID) 40 MG tablet Take 1 tablet daily for acid indigestion & reflux (Patient not taking: Reported on 05/08/2019)  . gabapentin (NEURONTIN) 300 MG capsule Take 1 capsule 3 x /day as needed  for Nerve Pains in back (Patient not taking: Reported on 05/08/2019)   No current  facility-administered medications on file prior to visit.   Allergies  Allergen Reactions  . Hctz [Hydrochlorothiazide]   . Tape Rash    Clear tape   Past Medical History:  Diagnosis Date  . Anxiety   . Arthritis   . Atrial flutter, paroxysmal ([place]) 01/04/2011  . CAD (coronary artery disease), native coronary artery 01/04/2011  . Diverticulitis   . Diverticulosis   . GERD (gastroesophageal reflux disease)   . Hemarthrosis of knee, right 01/04/2011  . Hemorrhoids   . History of meniscectomy of right knee 01/02/2011  . Hyperlipemia 01/04/2011  . Hyperlipidemia   . Hypertension   . Meniscus tear 01/02/2011  . NSVT (nonsustained ventricular tachycardia) ([place]) 01/02/2011  . Primary osteoarthritis of right knee 04/03/2017  . Right knee pain 01/02/2011  . Stented coronary artery 01/04/2011   LAD DES Resolute.  . Torn medial meniscus    right knee   Health Maintenance  Topic Date Due  . COLONOSCOPY  10/25/2020  . TETANUS/TDAP  02/28/2025  . INFLUENZA VACCINE  Completed  . Hepatitis C Screening  Completed  . HIV Screening  Completed   Immunization History  Administered Date(s) Administered  . Influenza Split 12/16/2013, 12/02/2014  . Influenza Whole 12/09/2012  . Influenza, Seasonal, Injecte, Preservative Fre 12/01/2015  . Influenza-Unspecified 11/19/2016, 11/30/2017  . PPD Test 11/05/2013, 01/04/2015, 02/10/2016, 03/21/2017, 04/16/2018  . Td 02/19/2005  . Tdap 03/01/2015   Last Colon -  10/26/2010 - Dr Deatra Ina recc 10 yr f/u due Sept 2022.  Past Surgical History:  Procedure Laterality Date  . APPENDECTOMY    . COLONOSCOPY    . CORONARY ANGIOPLASTY WITH STENT PLACEMENT  2012  . HAND SURGERY     right  . KNEE ARTHROSCOPY     x 8 left  . KNEE ARTHROSCOPY Right 04/21/2012   Procedure: ARTHROSCOPY KNEE;  Surgeon: Lorn Junes, MD;  Location: [place];  Service: Orthopedics;  Laterality: Right;  medial and lateral meniscectomies  . LEFT HEART CATHETERIZATION WITH CORONARY  ANGIOGRAM N/A 01/03/2011   Procedure: LEFT HEART CATHETERIZATION WITH CORONARY ANGIOGRAM;  Surgeon: Lorretta Harp, MD;  Location: [facility] CATH LAB;  Service: Cardiovascular;  Laterality: N/A;  . PERCUTANEOUS CORONARY STENT INTERVENTION (PCI-S) N/A 01/03/2011   Procedure: PERCUTANEOUS CORONARY STENT INTERVENTION (PCI-S);  Surgeon: Lorretta Harp, MD;  Location: [facility] CATH LAB;  Service: Cardiovascular;  Laterality: N/A;  . ROTATOR CUFF REPAIR     bilateral  . TONSILLECTOMY    . TOTAL KNEE ARTHROPLASTY Right 04/15/2017  . TOTAL KNEE ARTHROPLASTY Right 04/15/2017   Procedure: TOTAL KNEE ARTHROPLASTY;  Surgeon: Elsie Saas, MD;  Location: [place];  Service: Orthopedics;  Laterality: Right;  . TRICEPS TENDON REPAIR Bilateral  Family History  Problem Relation Age of Onset  . Diabetes Mother   . Heart disease Mother   . Heart attack Mother   . Heart disease Father   . Coronary artery disease Father   . Obesity Brother   . Diabetes Brother   . HIV/AIDS Brother   . Hypertension Brother    Social History   Socioeconomic History  . Marital status: Married    Spouse name: Maudie Mercury  . Number of children: 2  Occupational History  . Occupation: Self Employed    Comment: Education administrator: [employer]  Tobacco Use  . Smoking status: Never Smoker  . Smokeless tobacco: Never Used  Substance and Sexual Activity  . Alcohol use: Yes    Alcohol/week: 10.0 standard drinks    Types: 10 Cans of beer per week    Comment: 1-2 beers a night  . Drug use: No  . Sexual activity: Not on file     ROS Constitutional: Denies fever, chills, weight loss/gain, headaches, insomnia,  night sweats or change in appetite. Does c/o fatigue. Eyes: Denies redness, blurred vision, diplopia, discharge, itchy or watery eyes.  ENT: Denies discharge, congestion, post nasal drip, epistaxis, sore throat, earache, hearing loss, dental pain, Tinnitus, Vertigo, Sinus pain or snoring.  Cardio: Denies chest pain,  palpitations, irregular heartbeat, syncope, dyspnea, diaphoresis, orthopnea, PND, claudication or edema Respiratory: denies cough, dyspnea, DOE, pleurisy, hoarseness, laryngitis or wheezing.  Gastrointestinal: Denies dysphagia, heartburn, reflux, water brash, pain, cramps, nausea, vomiting, bloating, diarrhea, constipation, hematemesis, melena, hematochezia, jaundice or hemorrhoids Genitourinary: Denies dysuria, frequency,discharge, hematuria or flank pain. Has urgency, nocturia x 2-3 & occasional hesitancy. Musculoskeletal: Denies arthralgia, myalgia, stiffness, Jt. Swelling, pain, limp or strain/sprain. Denies Falls. Skin: Denies puritis, rash, hives, warts, acne, eczema or change in skin lesion Neuro: No weakness, tremor, incoordination, spasms, paresthesia or pain Psychiatric: Denies confusion, memory loss or sensory loss. Denies Depression. Endocrine: Denies change in weight, skin, hair change, nocturia, and paresthesia, diabetic polys, visual blurring or hyper / hypo glycemic episodes.  Heme/Lymph: No excessive bleeding, bruising or enlarged lymph nodes.  Physical Exam  BP 118/80   Pulse 60   Temp 97.8 F (36.6 C)   Resp 16   Ht 5\' 7"  (1.702 m)   Wt 198 lb 9.6 oz (90.1 kg)   BMI 31.11 kg/m   General Appearance: Well nourished and well groomed and in no apparent distress.  Eyes: PERRLA, EOMs, conjunctiva no swelling or erythema, normal fundi and vessels. Sinuses: No frontal/maxillary tenderness ENT/Mouth: EACs patent / TMs  nl. Nares clear without erythema, swelling, mucoid exudates. Oral hygiene is good. No erythema, swelling, or exudate. Tongue normal, non-obstructing. Tonsils not swollen or erythematous. Hearing normal.  Neck: Supple, thyroid not palpable. No bruits, nodes or JVD. Respiratory: Respiratory effort normal.  BS equal and clear bilateral without rales, rhonci, wheezing or stridor. Cardio: Heart sounds are normal with regular rate and rhythm and no murmurs, rubs or  gallops. Peripheral pulses are normal and equal bilaterally without edema. No aortic or femoral bruits. Chest: symmetric with normal excursions and percussion.  Abdomen: Soft, with Nl bowel sounds. Nontender, no guarding, rebound, hernias, masses, or organomegaly.  Lymphatics: Non tender without lymphadenopathy.  Musculoskeletal: Full ROM all peripheral extremities, joint stability, 5/5 strength, and normal gait. Skin: Warm and dry without rashes, lesions, cyanosis, clubbing or  ecchymosis.  Neuro: Cranial nerves intact, reflexes equal bilaterally. Normal muscle tone, no cerebellar symptoms. Sensation intact.  Pysch: Alert and  oriented X 3 with normal affect, insight and judgment appropriate.   Assessment and Plan  1. Annual Preventative/Screening Exam   2. Essential hypertension  - EKG 12-Lead - [place], RETROPERITNL ABD,  LTD - Urinalysis, Routine w reflex microscopic - Microalbumin / creatinine urine ratio - CBC with Differential/Platelet - COMPLETE METABOLIC PANEL WITH GFR - Magnesium - TSH  3. Hyperlipidemia, mixed  - EKG 12-Lead - [place], RETROPERITNL ABD,  LTD - Lipid panel - TSH  4. Abnormal glucose  - EKG 12-Lead - [place], RETROPERITNL ABD,  LTD - Hemoglobin A1c - Insulin, random  5. Vitamin D deficiency  - VITAMIN D 25 Hydroxy  6. Coronary artery disease involving native coronary  artery of native heart without angina pectoris  - EKG 12-Lead - Lipid panel  7. Gastroesophageal reflux disease  - CBC with Differential/Platelet  8. Testosterone deficiency  - Testosterone  9. BPH with obstruction/lower urinary tract symptoms  - PSA  10. Screening for colorectal cancer  - POC Hemoccult Bld/Stl   11. Prostate cancer screening  - PSA  12. Screening for ischemic heart disease  - EKG 12-Lead  13. FHx: heart disease  - EKG 12-Lead  14. Screening for abdominal aortic aneurysm  - [place], RETROPERITNL ABD,  LTD  15. Fatigue  - Iron,Total/Total Iron  Binding Cap - Vitamin B12 - CBC with Differential/Platelet - TSH  16. Medication management  - Urinalysis, Routine w reflex microscopic - Microalbumin / creatinine urine ratio - TSH - Hemoglobin A1c - Insulin, random - VITAMIN D 25 Hydroxy - Testosterone         Patient was counseled in prudent diet, weight control to achieve/maintain BMI less than 25, BP monitoring, regular exercise and medications as discussed.  Discussed med effects and SE's. Routine screening labs and tests as requested with regular follow-up as recommended. Over 40 minutes of exam, counseling, chart review and high complex critical decision making was performed   Kirtland Bouchard, MD

## 2019-05-08 ENCOUNTER — Other Ambulatory Visit: Payer: Self-pay

## 2019-05-08 ENCOUNTER — Encounter: Payer: Self-pay | Admitting: Internal Medicine

## 2019-05-08 ENCOUNTER — Ambulatory Visit: Payer: 59 | Admitting: Internal Medicine

## 2019-05-08 VITALS — BP 118/80 | HR 60 | Temp 97.8°F | Resp 16 | Ht 67.0 in | Wt 198.6 lb

## 2019-05-08 DIAGNOSIS — R7309 Other abnormal glucose: Secondary | ICD-10-CM

## 2019-05-08 DIAGNOSIS — Z79899 Other long term (current) drug therapy: Secondary | ICD-10-CM

## 2019-05-08 DIAGNOSIS — E782 Mixed hyperlipidemia: Secondary | ICD-10-CM

## 2019-05-08 DIAGNOSIS — E559 Vitamin D deficiency, unspecified: Secondary | ICD-10-CM

## 2019-05-08 DIAGNOSIS — Z125 Encounter for screening for malignant neoplasm of prostate: Secondary | ICD-10-CM

## 2019-05-08 DIAGNOSIS — I10 Essential (primary) hypertension: Secondary | ICD-10-CM

## 2019-05-08 DIAGNOSIS — Z8249 Family history of ischemic heart disease and other diseases of the circulatory system: Secondary | ICD-10-CM

## 2019-05-08 DIAGNOSIS — Z136 Encounter for screening for cardiovascular disorders: Secondary | ICD-10-CM

## 2019-05-08 DIAGNOSIS — N138 Other obstructive and reflux uropathy: Secondary | ICD-10-CM

## 2019-05-08 DIAGNOSIS — Z1211 Encounter for screening for malignant neoplasm of colon: Secondary | ICD-10-CM

## 2019-05-08 DIAGNOSIS — R5383 Other fatigue: Secondary | ICD-10-CM

## 2019-05-08 DIAGNOSIS — K219 Gastro-esophageal reflux disease without esophagitis: Secondary | ICD-10-CM

## 2019-05-08 DIAGNOSIS — I2510 Atherosclerotic heart disease of native coronary artery without angina pectoris: Secondary | ICD-10-CM

## 2019-05-08 DIAGNOSIS — Z0001 Encounter for general adult medical examination with abnormal findings: Secondary | ICD-10-CM

## 2019-05-08 DIAGNOSIS — E349 Endocrine disorder, unspecified: Secondary | ICD-10-CM

## 2019-05-11 ENCOUNTER — Other Ambulatory Visit: Payer: Self-pay | Admitting: Internal Medicine

## 2019-05-11 DIAGNOSIS — I10 Essential (primary) hypertension: Secondary | ICD-10-CM

## 2019-05-11 LAB — COMPLETE METABOLIC PANEL WITH GFR
AG Ratio: 1.9 (calc) (ref 1.0–2.5)
ALT: 24 U/L (ref 9–46)
AST: 37 U/L — ABNORMAL HIGH (ref 10–35)
Albumin: 4.3 g/dL (ref 3.6–5.1)
Alkaline phosphatase (APISO): 41 U/L (ref 35–144)
BUN/Creatinine Ratio: 20 (calc) (ref 6–22)
BUN: 26 mg/dL — ABNORMAL HIGH (ref 7–25)
CO2: 27 mmol/L (ref 20–32)
Calcium: 9.5 mg/dL (ref 8.6–10.3)
Chloride: 102 mmol/L (ref 98–110)
Creat: 1.33 mg/dL — ABNORMAL HIGH (ref 0.70–1.25)
GFR, Est African American: 65 mL/min/{1.73_m2} (ref >=60)
GFR, Est Non African American: 56 mL/min/{1.73_m2} — ABNORMAL LOW (ref >=60)
Globulin: 2.3 g/dL (calc) (ref 1.9–3.7)
Glucose, Bld: 100 mg/dL — ABNORMAL HIGH (ref 65–99)
Potassium: 4.7 mmol/L (ref 3.5–5.3)
Sodium: 137 mmol/L (ref 135–146)
Total Bilirubin: 0.7 mg/dL (ref 0.2–1.2)
Total Protein: 6.6 g/dL (ref 6.1–8.1)

## 2019-05-11 LAB — LIPID PANEL
Cholesterol: 153 mg/dL (ref <200)
HDL: 63 mg/dL (ref >=40)
LDL Cholesterol (Calc): 76 mg/dL (calc)
Non-HDL Cholesterol (Calc): 90 mg/dL (calc) (ref <130)
Total CHOL/HDL Ratio: 2.4 (calc) (ref <5.0)
Triglycerides: 62 mg/dL (ref <150)

## 2019-05-11 LAB — CBC WITH DIFFERENTIAL/PLATELET
Absolute Monocytes: 743 cells/uL (ref 200–950)
Basophils Absolute: 19 cells/uL (ref 0–200)
Basophils Relative: 0.3 %
Eosinophils Absolute: 176 cells/uL (ref 15–500)
Eosinophils Relative: 2.8 %
HCT: 37.6 % — ABNORMAL LOW (ref 38.5–50.0)
Hemoglobin: 11.9 g/dL — ABNORMAL LOW (ref 13.2–17.1)
Lymphs Abs: 1279 cells/uL (ref 850–3900)
MCH: 28.6 pg (ref 27.0–33.0)
MCHC: 31.6 g/dL — ABNORMAL LOW (ref 32.0–36.0)
MCV: 90.4 fL (ref 80.0–100.0)
MPV: 11.5 fL (ref 7.5–12.5)
Monocytes Relative: 11.8 %
Neutro Abs: 4082 cells/uL (ref 1500–7800)
Neutrophils Relative %: 64.8 %
Platelets: 197 10*3/uL (ref 140–400)
RBC: 4.16 10*6/uL — ABNORMAL LOW (ref 4.20–5.80)
RDW: 17.7 % — ABNORMAL HIGH (ref 11.0–15.0)
Total Lymphocyte: 20.3 %
WBC: 6.3 10*3/uL (ref 3.8–10.8)

## 2019-05-11 LAB — HEMOGLOBIN A1C
Hgb A1c MFr Bld: 5.3 % of total Hgb (ref <5.7)
Mean Plasma Glucose: 105 (calc)
eAG (mmol/L): 5.8 (calc)

## 2019-05-11 LAB — IRON, TOTAL/TOTAL IRON BINDING CAP
%SAT: 37 % (calc) (ref 20–48)
Iron: 113 ug/dL (ref 50–180)
TIBC: 305 mcg/dL (calc) (ref 250–425)

## 2019-05-11 LAB — URINALYSIS, ROUTINE W REFLEX MICROSCOPIC
Bilirubin Urine: NEGATIVE
Glucose, UA: NEGATIVE
Hgb urine dipstick: NEGATIVE
Ketones, ur: NEGATIVE
Leukocytes,Ua: NEGATIVE
Nitrite: NEGATIVE
Protein, ur: NEGATIVE
Specific Gravity, Urine: 1.008 (ref 1.001–1.03)
pH: 5.5 (ref 5.0–8.0)

## 2019-05-11 LAB — MICROALBUMIN / CREATININE URINE RATIO
Creatinine, Urine: 56 mg/dL (ref 20–320)
Microalb Creat Ratio: 5 mcg/mg creat (ref <30)
Microalb, Ur: 0.3 mg/dL

## 2019-05-11 LAB — INSULIN, RANDOM: Insulin: 4.3 u[IU]/mL

## 2019-05-11 LAB — TSH: TSH: 1.25 mIU/L (ref 0.40–4.50)

## 2019-05-11 LAB — PSA: PSA: 1.1 ng/mL (ref <=4.0)

## 2019-05-11 LAB — VITAMIN D 25 HYDROXY (VIT D DEFICIENCY, FRACTURES): Vit D, 25-Hydroxy: 98 ng/mL (ref 30–100)

## 2019-05-11 LAB — VITAMIN B12: Vitamin B-12: >2000 pg/mL — ABNORMAL HIGH (ref 200–1100)

## 2019-05-11 LAB — MAGNESIUM: Magnesium: 1.5 mg/dL (ref 1.5–2.5)

## 2019-05-11 LAB — TESTOSTERONE: Testosterone: 542 ng/dL (ref 250–827)

## 2019-05-11 MED ORDER — DOXAZOSIN MESYLATE 8 MG PO TABS: ORAL_TABLET | ORAL | 0 refills | Status: DC

## 2019-05-13 ENCOUNTER — Telehealth: Payer: Self-pay | Admitting: Internal Medicine

## 2019-05-13 ENCOUNTER — Other Ambulatory Visit: Payer: Self-pay | Admitting: Internal Medicine

## 2019-05-13 DIAGNOSIS — G5621 Lesion of ulnar nerve, right upper limb: Secondary | ICD-10-CM

## 2019-05-13 NOTE — Telephone Encounter (Signed)
patient called to request referral to specialist for Right fingers/hand/arm numbness, tingling, weakness x 3-4mths. Having difficulty buttoning pants. Patinet states he discussed at office visit.

## 2019-06-26 ENCOUNTER — Other Ambulatory Visit: Payer: Self-pay | Admitting: Internal Medicine

## 2019-06-26 DIAGNOSIS — I10 Essential (primary) hypertension: Secondary | ICD-10-CM

## 2019-06-26 DIAGNOSIS — F419 Anxiety disorder, unspecified: Secondary | ICD-10-CM

## 2019-06-26 MED ORDER — ATENOLOL 100 MG PO TABS: ORAL_TABLET | ORAL | 3 refills | Status: DC

## 2019-06-26 MED ORDER — ALPRAZOLAM 1 MG PO TABS: ORAL_TABLET | ORAL | 0 refills | Status: DC

## 2019-07-14 ENCOUNTER — Other Ambulatory Visit: Payer: Self-pay | Admitting: *Deleted

## 2019-07-14 ENCOUNTER — Other Ambulatory Visit: Payer: Self-pay | Admitting: Internal Medicine

## 2019-07-14 DIAGNOSIS — K219 Gastro-esophageal reflux disease without esophagitis: Secondary | ICD-10-CM

## 2019-07-14 MED ORDER — BUPROPION HCL ER (XL) 300 MG PO TB24: ORAL_TABLET | ORAL | 0 refills | Status: DC

## 2019-07-14 MED ORDER — PANTOPRAZOLE SODIUM 40 MG PO TBEC: DELAYED_RELEASE_TABLET | ORAL | 3 refills | Status: DC

## 2019-07-27 ENCOUNTER — Telehealth: Payer: Self-pay | Admitting: *Deleted

## 2019-07-27 NOTE — Telephone Encounter (Signed)
Testosterone approval from [facility] faxed to [facility] in [place], [place].

## 2019-08-17 NOTE — Progress Notes (Signed)
FOLLOW UP  Assessment and Plan:   CAD Followed by cardiology, stopped cardiac rehab due to covid Control blood pressure, cholesterol, glucose, increase exercise.   NSVT/A. Flutter Rate controlled today; monitor  continue diltiazem, atenolol, plavix  Followed by cardiology   Hypertension Well controlled with current medications  Monitor blood pressure at home; patient to call if consistently greater than 130/80 Continue DASH diet.   Reminder to go to the ER if any CP, SOB, nausea, dizziness, severe HA, changes vision/speech, left arm numbness and tingling and jaw pain.  Cholesterol Currently at goal; continue statin therapy;  Continue low cholesterol diet and exercise.  Check lipid panel.   Other abnormal glucose Recent A1Cs at goal Discussed diet/exercise, weight management  Defer A1C; check CMP  Obesity with co morbidities Long discussion about weight loss, diet, and exercise Recommended diet heavy in fruits and veggies and low in animal meats, cheeses, and dairy products, appropriate calorie intake Discussed ideal weight for height  Will follow up in 3 months  Vitamin D Def At goal at last visit; continue supplementation to maintain goal of 60-100 Defer Vit D level  Hypogonadism - continue replacement therapy, check testosterone levels as needed.   GERD Well managed on current medications  Discussed diet, avoiding triggers and other lifestyle changes  Insomnia Patient is on Benzos, have attempted taper and has trialed trazodone, gabapentin, benadryl apparently with very little success; discussed he should continue to limit benzo use to <5 days/week to limit tolerance/additions; NO DOSAGE INCREASE  good sleep hygiene discussed, increase day time activity   Continue diet and meds as discussed. Further disposition pending results of labs. Discussed med's effects and SE's.   Over 30 minutes of exam, counseling, chart review, and critical decision making was  performed.   Future Appointments  Date Time Provider [place]  11/19/2019 10:30 AM Unk Pinto, MD [facility] None  05/17/2020 10:00 AM Unk Pinto, MD [facility] None    ----------------------------------------------------------------------------------------------------------------------  HPI 64 y.o. male  presents for 3 month follow up on hypertension, cholesterol, glucose management, anxiety, weight and vitamin D deficiency.    Planning R hand surgery for nerve decompression next due due to atrophy by Dr. Grandville Silos.   He is following with [facility] for spinal stenosis, was getting injections, did PT with improved improved sx. Currently monitoring -    he has a diagnosis of anxiety and is currently on wellbutrin 300 mg daily and xanax 1mg  daily PRN, reports symptoms are well controlled on current regimen. he reports he typically takes this medication at night to assist with sleep. Trazodone and gabapentin have been prescribed at night for sleep but reports didn't help. Doesn't want to d/c Wellbutrin to due to focus benefit as he is still working.   he has a diagnosis of GERD which is currently managed by protonixhe reports symptoms is currently well controlled, and denies breakthrough reflux, burning in chest, hoarseness or cough.  Had breakthrough with famotidine.   BMI is Body mass index is 30.73 kg/m., he has been working on diet and exercise.  Wt Readings from Last 3 Encounters:  08/19/19 196 lb 3.2 oz (89 kg)  05/08/19 198 lb 9.6 oz (90.1 kg)  04/03/19 193 lb 12.8 oz (87.9 kg)   Patient had a negative heart cath in 2009, and then in 2012 pesented with ACS and had PCA/DES implanted, Dr. Gwenlyn Found continues to follow.  He has had episodes of NSVT/a. Flutter; he is on cardizem, plavix and olmesartan  His blood pressure  has been controlled at home, today their BP is BP: 110/74  He does workout. He denies chest pain, shortness of breath, dizziness.   He is on  cholesterol medication (atorvastatin 40 mg daily) and denies myalgias. His cholesterol is not at goal. The cholesterol last visit was:   Lab Results  Component Value Date   CHOL 153 05/08/2019   HDL 63 05/08/2019   LDLCALC 76 05/08/2019   TRIG 62 05/08/2019   CHOLHDL 2.4 05/08/2019    He has been working on diet and exercise for glucose management, and denies foot ulcerations, increased appetite, nausea, paresthesia of the feet, polydipsia, polyuria, visual disturbances, vomiting and weight loss. Last A1C in the office was:  Lab Results  Component Value Date   HGBA1C 5.3 05/08/2019   Patient is on Vitamin D supplement.   Lab Results  Component Value Date   VD25OH 98 05/08/2019     He has a history of testosterone deficiency and is on testosterone replacement, 400 mg every 14 days last injection was 8 days ago. He states that the testosterone helps with his energy, libido, muscle mass. Lab Results  Component Value Date   TESTOSTERONE 542 05/08/2019    Current Medications:  Current Outpatient Medications on File Prior to Visit  Medication Sig  . acetaminophen (TYLENOL) 325 MG tablet Take 2 tablets (650 mg total) by mouth every 4 (four) hours as needed for mild pain ((score 1 to 3) or temp > 100.5).  Marland Kitchen ALPRAZolam (XANAX) 1 MG tablet Take 1/2-1 tablet ONLY if needed for Sleep &  limit to 5 days /week to avoid Addiction & Dementia  . Ascorbic Acid (VITAMIN C PO) Take 1 tablet by mouth daily as needed (for immune system support).  . ASPIRIN 81 PO Take 81 mg by mouth daily.   Marland Kitchen atenolol (TENORMIN) 100 MG tablet Take 1 tablet Daily for BP  . atorvastatin (LIPITOR) 40 MG tablet Take 1 tablet Daily for Cholesterol  . buPROPion (WELLBUTRIN XL) 300 MG 24 hr tablet Take 1 tablet Daily for Mood, Focus & Concentration  . clopidogrel (PLAVIX) 75 MG tablet Take 1 tablet Daily to prevent Blood Clots  . cyclobenzaprine (FLEXERIL) 10 MG tablet Take 1/2 to 1 tablet 3 x /day as needed for Muscle Spasm   . diclofenac sodium (VOLTAREN) 1 % GEL Apply 2 to 4 grams 2 to 4 x / day  . diltiazem (CARTIA XT) 240 MG 24 hr capsule Take 1 capsule Daily for BP  . doxazosin (CARDURA) 8 MG tablet Take 1 tablet Daily for BP & Prostate  . furosemide (LASIX) 80 MG tablet Take 1 tablet daily for BP & Fluid Retention / Ankle Swelling  . meloxicam (MOBIC) 15 MG tablet Take 1/2 to 1 tablet Daily with Food for Pain & Inflammation & limit to 4-5 days /week  to avoid kidney damage  . Multiple Vitamin (MULTIVITAMIN WITH MINERALS) TABS Take 2 tablets by mouth daily. Mega Men Multivitamin.  . nitroGLYCERIN (NITROSTAT) 0.4 MG SL tablet Dissolve 1 tablet under tongue every 3 to 5 minutes as needed for Chest Pain  . olmesartan (BENICAR) 40 MG tablet Take 1/2  to 1 tablet Daily for BP  . pantoprazole (PROTONIX) 40 MG tablet Take 1 tablet Daily for Acid Indigestion & Heart Burn  . tadalafil (CIALIS) 20 MG tablet Take 1/2 to 1 tablet every 2 to 3 days as needed for XXXX  . tamsulosin (FLOMAX) 0.4 MG CAPS capsule Take 1 capsule at Bedtime for Prostate /  Urine Flow  . testosterone cypionate (DEPOTESTOSTERONE CYPIONATE) 200 MG/ML injection Inject 2 ml IM every 2 weeks  . famotidine (PEPCID) 40 MG tablet Take 1 tablet daily for acid indigestion & reflux (Patient not taking: Reported on 08/19/2019)   No current facility-administered medications on file prior to visit.     Allergies:  Allergies  Allergen Reactions  . Hctz [Hydrochlorothiazide]   . Tape Rash    Clear tape     Medical History:  Past Medical History:  Diagnosis Date  . Anxiety   . Arthritis   . Atrial flutter, paroxysmal ([place]) 01/04/2011  . CAD (coronary artery disease), native coronary artery 01/04/2011  . Diverticulitis   . Diverticulosis   . GERD (gastroesophageal reflux disease)   . Hemarthrosis of knee, right 01/04/2011  . Hemorrhoids   . History of meniscectomy of right knee 01/02/2011  . Hyperlipemia 01/04/2011  . Hyperlipidemia   .  Hypertension   . Meniscus tear 01/02/2011  . NSVT (nonsustained ventricular tachycardia) ([place]) 01/02/2011  . Primary osteoarthritis of right knee 04/03/2017  . Right knee pain 01/02/2011  . Stented coronary artery 01/04/2011   LAD DES Resolute.  . Torn medial meniscus    right knee   Family history- Reviewed and unchanged Social history- Reviewed and unchanged   Review of Systems:  Review of Systems  Constitutional: Negative for malaise/fatigue and weight loss.  HENT: Negative for hearing loss and tinnitus.   Eyes: Negative for blurred vision and double vision.  Respiratory: Negative for cough, shortness of breath and wheezing.   Cardiovascular: Negative for chest pain, palpitations, orthopnea, claudication and leg swelling.  Gastrointestinal: Negative for abdominal pain, blood in stool, constipation, diarrhea, heartburn, melena, nausea and vomiting.  Genitourinary: Negative.   Musculoskeletal: Positive for back pain (spinal stenosis per ortho). Negative for joint pain and myalgias.  Skin: Negative for rash.  Neurological: Negative for dizziness, tingling, sensory change, weakness and headaches.  Endo/Heme/Allergies: Negative for polydipsia.  Psychiatric/Behavioral: Negative for depression and substance abuse. The patient has insomnia. The patient is not nervous/anxious.   All other systems reviewed and are negative.   Physical Exam: BP 110/74   Pulse 76   Temp (!) 97.5 F (36.4 C)   Wt 196 lb 3.2 oz (89 kg)   SpO2 97%   BMI 30.73 kg/m  Wt Readings from Last 3 Encounters:  08/19/19 196 lb 3.2 oz (89 kg)  05/08/19 198 lb 9.6 oz (90.1 kg)  04/03/19 193 lb 12.8 oz (87.9 kg)   General Appearance: Well nourished, in no apparent distress. Eyes: PERRLA, EOMs, conjunctiva no swelling or erythema Sinuses: No Frontal/maxillary tenderness ENT/Mouth: Ext aud canals clear, TMs without erythema, bulging. No erythema, swelling, or exudate on post pharynx.  Tonsils not swollen or  erythematous. Hearing normal.  Neck: Supple, thyroid normal.  Respiratory: Respiratory effort normal, BS equal bilaterally without rales, rhonchi, wheezing or stridor.  Cardio: RRR with no MRGs. Brisk peripheral pulses without edema.  Abdomen: Soft, + BS.  Non tender, no guarding, rebound, hernias, masses. Lymphatics: Non tender without lymphadenopathy.  Musculoskeletal: Full ROM, 5/5 strength, Normal gait, lumbar non-tender, no muscle spasms, neg straight leg raise Skin: Warm, dry without rashes, lesions, ecchymosis.  Neuro: Cranial nerves intact. No cerebellar symptoms.  Psych: Awake and oriented X 3, normal affect, Insight and Judgment appropriate.    Izora Ribas, NP 10:56 AM [facility]

## 2019-08-19 ENCOUNTER — Other Ambulatory Visit: Payer: Self-pay

## 2019-08-19 ENCOUNTER — Ambulatory Visit: Payer: 59 | Admitting: Adult Health

## 2019-08-19 ENCOUNTER — Encounter: Payer: Self-pay | Admitting: Adult Health

## 2019-08-19 VITALS — BP 110/74 | HR 76 | Temp 97.5°F | Wt 196.2 lb

## 2019-08-19 DIAGNOSIS — N183 Chronic kidney disease, stage 3 unspecified: Secondary | ICD-10-CM

## 2019-08-19 DIAGNOSIS — Z79899 Other long term (current) drug therapy: Secondary | ICD-10-CM

## 2019-08-19 DIAGNOSIS — E782 Mixed hyperlipidemia: Secondary | ICD-10-CM

## 2019-08-19 DIAGNOSIS — R7309 Other abnormal glucose: Secondary | ICD-10-CM

## 2019-08-19 DIAGNOSIS — I10 Essential (primary) hypertension: Secondary | ICD-10-CM

## 2019-08-19 DIAGNOSIS — I130 Hypertensive heart and chronic kidney disease with heart failure and stage 1 through stage 4 chronic kidney disease, or unspecified chronic kidney disease: Secondary | ICD-10-CM | POA: Diagnosis not present

## 2019-08-19 DIAGNOSIS — E669 Obesity, unspecified: Secondary | ICD-10-CM

## 2019-08-19 DIAGNOSIS — I4892 Unspecified atrial flutter: Secondary | ICD-10-CM

## 2019-08-19 DIAGNOSIS — I2510 Atherosclerotic heart disease of native coronary artery without angina pectoris: Secondary | ICD-10-CM | POA: Diagnosis not present

## 2019-08-19 DIAGNOSIS — E349 Endocrine disorder, unspecified: Secondary | ICD-10-CM

## 2019-08-19 DIAGNOSIS — Z955 Presence of coronary angioplasty implant and graft: Secondary | ICD-10-CM

## 2019-08-19 DIAGNOSIS — F419 Anxiety disorder, unspecified: Secondary | ICD-10-CM

## 2019-08-19 DIAGNOSIS — K219 Gastro-esophageal reflux disease without esophagitis: Secondary | ICD-10-CM

## 2019-08-19 DIAGNOSIS — E559 Vitamin D deficiency, unspecified: Secondary | ICD-10-CM

## 2019-08-19 DIAGNOSIS — I4729 Other ventricular tachycardia: Secondary | ICD-10-CM

## 2019-08-19 DIAGNOSIS — I472 Ventricular tachycardia: Secondary | ICD-10-CM

## 2019-08-19 MED ORDER — TADALAFIL 20 MG PO TABS: ORAL_TABLET | ORAL | 11 refills | Status: DC

## 2019-08-19 NOTE — Patient Instructions (Addendum)
Goals    . Exercise 150 min/wk Moderate Activity    . LDL CALC < 70    . Weight (lb) < 190 lb (86.2 kg)          NEW GUIDELINES FOR BENOZOS  New guidelines suggest the benzodiazepines are best short term, with prolonged use they lead to physical and psychological dependence. In addition, evidence suggest that for insomnia the effectiveness wanes in 4 weeks and the risks out weight their benefits. Use of these agents have been associated with dementia, falls, motor vehicle accidents and physical addiction. Decreasing these medication have been proven to show improvements in cognition, alertness, decrease of falls and daytime sedation.   Would recommend a taper to avoid taking daily once able - ideally to limit to 5 days/week or less   symptoms of withdrawal include, insomnia, anxiety, irritability, sweating and stomach or intestinal symptoms like diarrhea or nausea.    Perhaps can trial taper off once no longer working and can get off of or reduce wellbutrin dose -    A great goal to work towards is aiming to get in a serving daily of some of the most nutritionally dense foods - G- BOMBS daily

## 2019-08-20 ENCOUNTER — Other Ambulatory Visit: Payer: Self-pay | Admitting: Adult Health

## 2019-08-20 ENCOUNTER — Encounter: Payer: Self-pay | Admitting: Adult Health

## 2019-08-20 DIAGNOSIS — R7989 Other specified abnormal findings of blood chemistry: Secondary | ICD-10-CM

## 2019-08-20 LAB — CBC WITH DIFFERENTIAL/PLATELET
Absolute Monocytes: 700 cells/uL (ref 200–950)
Basophils Absolute: 11 cells/uL (ref 0–200)
Basophils Relative: 0.2 %
Eosinophils Absolute: 148 cells/uL (ref 15–500)
Eosinophils Relative: 2.8 %
HCT: 39.6 % (ref 38.5–50.0)
Hemoglobin: 13.1 g/dL — ABNORMAL LOW (ref 13.2–17.1)
Lymphs Abs: 1473 cells/uL (ref 850–3900)
MCH: 31.5 pg (ref 27.0–33.0)
MCHC: 33.1 g/dL (ref 32.0–36.0)
MCV: 95.2 fL (ref 80.0–100.0)
MPV: 10.7 fL (ref 7.5–12.5)
Monocytes Relative: 13.2 %
Neutro Abs: 2968 cells/uL (ref 1500–7800)
Neutrophils Relative %: 56 %
Platelets: 178 10*3/uL (ref 140–400)
RBC: 4.16 10*6/uL — ABNORMAL LOW (ref 4.20–5.80)
RDW: 13.1 % (ref 11.0–15.0)
Total Lymphocyte: 27.8 %
WBC: 5.3 10*3/uL (ref 3.8–10.8)

## 2019-08-20 LAB — COMPLETE METABOLIC PANEL WITH GFR
AG Ratio: 1.7 (calc) (ref 1.0–2.5)
ALT: 32 U/L (ref 9–46)
AST: 45 U/L — ABNORMAL HIGH (ref 10–35)
Albumin: 4.6 g/dL (ref 3.6–5.1)
Alkaline phosphatase (APISO): 47 U/L (ref 35–144)
BUN: 19 mg/dL (ref 7–25)
CO2: 26 mmol/L (ref 20–32)
Calcium: 10.5 mg/dL — ABNORMAL HIGH (ref 8.6–10.3)
Chloride: 105 mmol/L (ref 98–110)
Creat: 1.25 mg/dL (ref 0.70–1.25)
GFR, Est African American: 70 mL/min/{1.73_m2} (ref >=60)
GFR, Est Non African American: 60 mL/min/{1.73_m2} (ref >=60)
Globulin: 2.7 g/dL (calc) (ref 1.9–3.7)
Glucose, Bld: 100 mg/dL — ABNORMAL HIGH (ref 65–99)
Potassium: 4.1 mmol/L (ref 3.5–5.3)
Sodium: 139 mmol/L (ref 135–146)
Total Bilirubin: 0.7 mg/dL (ref 0.2–1.2)
Total Protein: 7.3 g/dL (ref 6.1–8.1)

## 2019-08-20 LAB — LIPID PANEL
Cholesterol: 152 mg/dL (ref <200)
HDL: 53 mg/dL (ref >=40)
LDL Cholesterol (Calc): 79 mg/dL (calc)
Non-HDL Cholesterol (Calc): 99 mg/dL (calc) (ref <130)
Total CHOL/HDL Ratio: 2.9 (calc) (ref <5.0)
Triglycerides: 118 mg/dL (ref <150)

## 2019-08-20 LAB — MAGNESIUM: Magnesium: 1.4 mg/dL — ABNORMAL LOW (ref 1.5–2.5)

## 2019-08-20 LAB — TSH: TSH: 0.96 mIU/L (ref 0.40–4.50)

## 2019-08-20 MED ORDER — MAGNESIUM 250 MG PO TABS: 1.0000 | ORAL_TABLET | Freq: Every day | ORAL | 1 refills | Status: DC

## 2019-08-20 MED ORDER — ROSUVASTATIN CALCIUM 40 MG PO TABS: 40.0000 mg | ORAL_TABLET | Freq: Every day | ORAL | 1 refills | Status: DC

## 2019-08-27 ENCOUNTER — Telehealth: Payer: Self-pay

## 2019-08-27 NOTE — Telephone Encounter (Signed)
Dan Europe from [place] office called asking about whether or not patient should stop taking his Plavix prior to his carpal tunnel and ulnar neoroplasty of elbow surgeries. Dan Europe states that she spoke with patient this morning and has not taken his plavix for two days. Spoke with provider here, and she states that patient should be off the blood thinner for at least 5-7 days. [place] office advised and its at the surgeon's discretion.

## 2019-09-02 ENCOUNTER — Other Ambulatory Visit: Payer: Self-pay | Admitting: Internal Medicine

## 2019-09-02 DIAGNOSIS — I10 Essential (primary) hypertension: Secondary | ICD-10-CM

## 2019-09-02 MED ORDER — DOXAZOSIN MESYLATE 8 MG PO TABS: ORAL_TABLET | ORAL | 0 refills | Status: DC

## 2019-09-14 ENCOUNTER — Other Ambulatory Visit: Payer: Self-pay | Admitting: *Deleted

## 2019-09-14 DIAGNOSIS — I2510 Atherosclerotic heart disease of native coronary artery without angina pectoris: Secondary | ICD-10-CM

## 2019-09-14 MED ORDER — CLOPIDOGREL BISULFATE 75 MG PO TABS: ORAL_TABLET | ORAL | 3 refills | Status: DC

## 2019-09-24 ENCOUNTER — Other Ambulatory Visit: Payer: Self-pay | Admitting: Internal Medicine

## 2019-09-24 DIAGNOSIS — F419 Anxiety disorder, unspecified: Secondary | ICD-10-CM

## 2019-09-24 DIAGNOSIS — E349 Endocrine disorder, unspecified: Secondary | ICD-10-CM

## 2019-09-24 MED ORDER — TESTOSTERONE CYPIONATE 200 MG/ML IM SOLN: INTRAMUSCULAR | 3 refills | Status: DC

## 2019-09-24 MED ORDER — ALPRAZOLAM 1 MG PO TABS: ORAL_TABLET | ORAL | 0 refills | Status: DC

## 2019-10-11 ENCOUNTER — Other Ambulatory Visit: Payer: Self-pay | Admitting: Internal Medicine

## 2019-11-13 NOTE — Progress Notes (Signed)
FOLLOW UP  Assessment and Plan:   CAD Followed by cardiology, stopped cardiac rehab due to covid Control blood pressure, cholesterol, glucose, increase exercise.   NSVT/A. Flutter Rate controlled; monitor  continue diltiazem, atenolol, plavix/bASA  Followed by cardiology   Hypertension Well controlled with current medications  Monitor blood pressure at home; patient to call if consistently greater than 130/80 Continue DASH diet.   Reminder to go to the ER if any CP, SOB, nausea, dizziness, severe HA, changes vision/speech, left arm numbness and tingling and jaw pain.  Cholesterol Currently at goal; continue statin therapy;  Continue low cholesterol diet and exercise.  Check lipid panel.   Other abnormal glucose Recent A1Cs at goal Discussed diet/exercise, weight management  Defer A1C; check CMP  Obesity with co morbidities Long discussion about weight loss, diet, and exercise Recommended diet heavy in fruits and veggies and low in animal meats, cheeses, and dairy products, appropriate calorie intake Discussed ideal weight for height  Will follow up in 3 months  Vitamin D Def At goal at last visit; continue supplementation to maintain goal of 60-100 Defer Vit D level  Hypogonadism - continue replacement therapy, check testosterone levels as needed.   GERD Well managed on current medications  Discussed diet, avoiding triggers and other lifestyle changes  Insomnia Patient is on Benzos, have attempted taper and has trialed trazodone, gabapentin, benadryl apparently with very little success; discussed he should continue to limit benzo use to <5 days/week to limit tolerance/additions; NO DOSAGE INCREASE  Will work aggressively on this once retired  good sleep hygiene discussed, increase day time activity  Elevated LFTs Reduce alcohol, schedule abd [place] - phone number given  Continue diet and meds as discussed. Further disposition pending results of labs. Discussed  med's effects and SE's.   Over 30 minutes of exam, counseling, chart review, and critical decision making was performed.   Future Appointments  Date Time Provider Brady  05/17/2020 10:00 AM Unk Pinto, MD [facility] None    ----------------------------------------------------------------------------------------------------------------------  HPI 64 y.o. male  presents for 3 month follow up on hypertension, cholesterol, glucose management, anxiety, weight and vitamin D deficiency.    Had R carpal tunnel and R elbow nerve decompression 2 months ago  due to atrophy by Dr. Grandville Silos, strength is improved. Reports noted mildly tender lump to R wrist at base of thumb worse since then, notably doesn't clearly transilluminate today, has follow up with surgeon scheduled next month, will discuss.    He is following with [facility] for spinal stenosis, was getting injections, did PT with improved improved sx. Currently monitoring -    he has a diagnosis of anxiety and is currently on wellbutrin 300 mg daily and xanax 1mg  daily PRN, reports symptoms are well controlled on current regimen. he reports he typically takes this medication at night to assist with sleep. Trazodone and gabapentin have been prescribed at night for sleep but reports didn't help. Doesn't want to d/c Wellbutrin to due to focus benefit as he is still working.   he has a diagnosis of GERD which is currently managed by protonix. Had breakthrough with famotidine.   BMI is Body mass index is 30.7 kg/m., he has been working on diet and exercise.  Wt Readings from Last 3 Encounters:  11/16/19 196 lb (88.9 kg)  08/19/19 196 lb 3.2 oz (89 kg)  05/08/19 198 lb 9.6 oz (90.1 kg)   Patient had a negative heart cath in 2009, and then in 2012 pesented with ACS  and had PCA/DES implanted, Dr. Gwenlyn Found following. He has had episodes of NSVT/a. Flutter; he is on cardizem, plavix/bASA, and olmesartan.  His blood pressure has been  controlled at home, today their BP is BP: 118/76  He does workout. He denies chest pain, shortness of breath, dizziness.   He is on cholesterol medication (atorvastatin 40 mg daily switched to rosuvastatin 40 mg daily- states hasn't switched, taking atorvastatin until runs out) and denies myalgias. His cholesterol is not at goal of LDL <70. The cholesterol last visit was:   Lab Results  Component Value Date   CHOL 152 08/19/2019   HDL 53 08/19/2019   LDLCALC 79 08/19/2019   TRIG 118 08/19/2019   CHOLHDL 2.9 08/19/2019    He has been working on diet and exercise for hx of prediabetes (5.7% in 2012, 5.8% in 2015, well controlled since by lifestyle), and denies foot ulcerations, increased appetite, nausea, paresthesia of the feet, polydipsia, polyuria, visual disturbances, vomiting and weight loss. Last A1C in the office was:  Lab Results  Component Value Date   HGBA1C 5.3 05/08/2019   Patient is on Vitamin D supplement.   Lab Results  Component Value Date   VD25OH 98 05/08/2019     He has a history of testosterone deficiency and is on testosterone replacement, 400 mg every 14 days last injection was 10 days ago. He states that the testosterone helps with his energy, libido, muscle mass. Lab Results  Component Value Date   TESTOSTERONE 542 05/08/2019   Negative hepatitis panel 2015, recent normal iron. NO recent imaging - [place] abd ordered to be scheduled by patient - does admit to ~60 alcoholic drinks weekly  Lab Results  Component Value Date   ALT 32 08/19/2019   AST 45 (H) 08/19/2019   ALKPHOS 45 04/04/2017   BILITOT 0.7 08/19/2019       Current Medications:  Current Outpatient Medications on File Prior to Visit  Medication Sig  . acetaminophen (TYLENOL) 325 MG tablet Take 2 tablets (650 mg total) by mouth every 4 (four) hours as needed for mild pain ((score 1 to 3) or temp > 100.5).  Marland Kitchen ALPRAZolam (XANAX) 1 MG tablet Take 1/2-1 tablet ONLY if needed for Sleep &  limit to 5  days /week to avoid Addiction & Dementia  . Ascorbic Acid (VITAMIN C PO) Take 1 tablet by mouth daily as needed (for immune system support).  . ASPIRIN 81 PO Take 81 mg by mouth daily.   Marland Kitchen atenolol (TENORMIN) 100 MG tablet Take 1 tablet Daily for BP  . buPROPion (WELLBUTRIN XL) 300 MG 24 hr tablet TAKE 1 TABLET BY MOUTH DAILY FOR MOOD OR FOCUS OR CONCENTRATION  . clopidogrel (PLAVIX) 75 MG tablet Take 1 tablet Daily to prevent Blood Clots  . cyclobenzaprine (FLEXERIL) 10 MG tablet Take 1/2 to 1 tablet 3 x /day as needed for Muscle Spasm  . diclofenac sodium (VOLTAREN) 1 % GEL Apply 2 to 4 grams 2 to 4 x / day  . diltiazem (CARTIA XT) 240 MG 24 hr capsule Take 1 capsule Daily for BP  . doxazosin (CARDURA) 8 MG tablet Take 1 tablet at Bedtime for BP & Prostate  . furosemide (LASIX) 80 MG tablet Take 1 tablet daily for BP & Fluid Retention / Ankle Swelling  . Magnesium 250 MG TABS Take 1 tablet (250 mg total) by mouth daily with supper.  . meloxicam (MOBIC) 15 MG tablet Take 1/2 to 1 tablet Daily with Food for Pain &  Inflammation & limit to 4-5 days /week  to avoid kidney damage  . Multiple Vitamin (MULTIVITAMIN WITH MINERALS) TABS Take 2 tablets by mouth daily. Mega Men Multivitamin.  . nitroGLYCERIN (NITROSTAT) 0.4 MG SL tablet Dissolve 1 tablet under tongue every 3 to 5 minutes as needed for Chest Pain  . olmesartan (BENICAR) 40 MG tablet Take 1/2  to 1 tablet Daily for BP  . pantoprazole (PROTONIX) 40 MG tablet Take 1 tablet Daily for Acid Indigestion & Heart Burn  . tadalafil (CIALIS) 20 MG tablet Take 1/2 to 1 tablet every 2 to 3 days as needed for XXXX  . tamsulosin (FLOMAX) 0.4 MG CAPS capsule Take 1 capsule at Bedtime for Prostate / Urine Flow  . testosterone cypionate (DEPOTESTOSTERONE CYPIONATE) 200 MG/ML injection Inject 2 ml IM every 2 weeks  . famotidine (PEPCID) 40 MG tablet Take 1 tablet daily for acid indigestion & reflux (Patient not taking: Reported on 08/19/2019)  . rosuvastatin  (CRESTOR) 40 MG tablet Take 1 tablet (40 mg total) by mouth daily. (Patient not taking: Reported on 11/16/2019)   No current facility-administered medications on file prior to visit.     Allergies:  Allergies  Allergen Reactions  . Hctz [Hydrochlorothiazide]   . Tape Rash    Clear tape     Medical History:  Past Medical History:  Diagnosis Date  . Anxiety   . Arthritis   . Atrial flutter, paroxysmal ([place]) 01/04/2011  . CAD (coronary artery disease), native coronary artery 01/04/2011  . Diverticulitis   . Diverticulosis   . GERD (gastroesophageal reflux disease)   . Hemarthrosis of knee, right 01/04/2011  . Hemorrhoids   . History of meniscectomy of right knee 01/02/2011  . Hyperlipemia 01/04/2011  . Hyperlipidemia   . Hypertension   . Meniscus tear 01/02/2011  . NSVT (nonsustained ventricular tachycardia) ([place]) 01/02/2011  . Primary osteoarthritis of right knee 04/03/2017  . Right knee pain 01/02/2011  . Stented coronary artery 01/04/2011   LAD DES [place].  . Torn medial meniscus    right knee   Family history- Reviewed and unchanged Social history- Reviewed and unchanged   Review of Systems:  Review of Systems  Constitutional: Negative for malaise/fatigue and weight loss.  HENT: Negative for hearing loss and tinnitus.   Eyes: Negative for blurred vision and double vision.  Respiratory: Negative for cough, shortness of breath and wheezing.   Cardiovascular: Negative for chest pain, palpitations, orthopnea, claudication and leg swelling.  Gastrointestinal: Negative for abdominal pain, blood in stool, constipation, diarrhea, heartburn, melena, nausea and vomiting.  Genitourinary: Negative.   Musculoskeletal: Positive for back pain (spinal stenosis per ortho). Negative for joint pain and myalgias.  Skin: Negative for rash.  Neurological: Negative for dizziness, tingling, sensory change, weakness and headaches.  Endo/Heme/Allergies: Negative for polydipsia.   Psychiatric/Behavioral: Negative for depression and substance abuse. The patient has insomnia. The patient is not nervous/anxious.   All other systems reviewed and are negative.   Physical Exam: BP 118/76   Pulse 70   Temp 98.1 F (36.7 C)   Wt 196 lb (88.9 kg)   SpO2 94%   BMI 30.70 kg/m  Wt Readings from Last 3 Encounters:  11/16/19 196 lb (88.9 kg)  08/19/19 196 lb 3.2 oz (89 kg)  05/08/19 198 lb 9.6 oz (90.1 kg)   General Appearance: Well nourished, in no apparent distress. Eyes: PERRLA, EOMs, conjunctiva no swelling or erythema Sinuses: No Frontal/maxillary tenderness ENT/Mouth: Ext aud canals clear, TMs without erythema, bulging.  No erythema, swelling, or exudate on post pharynx.  Tonsils not swollen or erythematous. Hearing normal.  Neck: Supple, thyroid normal.  Respiratory: Respiratory effort normal, BS equal bilaterally without rales, rhonchi, wheezing or stridor.  Cardio: RRR with no MRGs. Brisk peripheral pulses without edema.  Abdomen: Soft, + BS.  Non tender, no guarding, rebound, hernias, masses. Lymphatics: Non tender without lymphadenopathy.  Musculoskeletal: Full ROM, 5/5 strength, Normal gait, lumbar non-tender. R hand with atrophied muscles, smooth mobile lump to R wrist at base of thumb, does not easily transilluminate Skin: Warm, dry without rashes, lesions, ecchymosis.  Neuro: Cranial nerves intact. No cerebellar symptoms.  Psych: Awake and oriented X 3, normal affect, Insight and Judgment appropriate.    Izora Ribas, NP 10:22 AM [facility]

## 2019-11-16 ENCOUNTER — Ambulatory Visit: Payer: 59 | Admitting: Adult Health

## 2019-11-16 ENCOUNTER — Encounter: Payer: Self-pay | Admitting: Adult Health

## 2019-11-16 ENCOUNTER — Other Ambulatory Visit: Payer: Self-pay

## 2019-11-16 VITALS — BP 118/76 | HR 70 | Temp 98.1°F | Wt 196.0 lb

## 2019-11-16 DIAGNOSIS — E669 Obesity, unspecified: Secondary | ICD-10-CM

## 2019-11-16 DIAGNOSIS — I2510 Atherosclerotic heart disease of native coronary artery without angina pectoris: Secondary | ICD-10-CM | POA: Diagnosis not present

## 2019-11-16 DIAGNOSIS — R7989 Other specified abnormal findings of blood chemistry: Secondary | ICD-10-CM

## 2019-11-16 DIAGNOSIS — I10 Essential (primary) hypertension: Secondary | ICD-10-CM | POA: Diagnosis not present

## 2019-11-16 DIAGNOSIS — I4892 Unspecified atrial flutter: Secondary | ICD-10-CM

## 2019-11-16 DIAGNOSIS — I130 Hypertensive heart and chronic kidney disease with heart failure and stage 1 through stage 4 chronic kidney disease, or unspecified chronic kidney disease: Secondary | ICD-10-CM | POA: Diagnosis not present

## 2019-11-16 DIAGNOSIS — E559 Vitamin D deficiency, unspecified: Secondary | ICD-10-CM

## 2019-11-16 DIAGNOSIS — E782 Mixed hyperlipidemia: Secondary | ICD-10-CM

## 2019-11-16 DIAGNOSIS — R7309 Other abnormal glucose: Secondary | ICD-10-CM

## 2019-11-16 DIAGNOSIS — F419 Anxiety disorder, unspecified: Secondary | ICD-10-CM

## 2019-11-16 DIAGNOSIS — Z79899 Other long term (current) drug therapy: Secondary | ICD-10-CM

## 2019-11-16 DIAGNOSIS — N183 Chronic kidney disease, stage 3 unspecified: Secondary | ICD-10-CM

## 2019-11-16 DIAGNOSIS — E349 Endocrine disorder, unspecified: Secondary | ICD-10-CM

## 2019-11-16 NOTE — Patient Instructions (Addendum)
Goals    . Exercise 150 min/wk Moderate Activity    . LDL CALC < 70    . Weight (lb) < 190 lb (86.2 kg)         YOU CAN CALL TO MAKE AN ULTRASOUND..  I have put in an order for an ultrasound for you to have You can set them up at your convenience by calling this number [phone] You will likely have the ultrasound at [place] [address]  If you have any issues call our office and we will set this up for you.        Fatty Liver Disease  Fatty liver disease occurs when too much fat has built up in your liver cells. Fatty liver disease is also called hepatic steatosis or steatohepatitis. The liver removes harmful substances from your bloodstream and produces fluids that your body needs. It also helps your body use and store energy from the food you eat. In many cases, fatty liver disease does not cause symptoms or problems. It is often diagnosed when tests are being done for other reasons. However, over time, fatty liver can cause inflammation that may lead to more serious liver problems, such as scarring of the liver (cirrhosis) and liver failure. Fatty liver is associated with insulin resistance, increased body fat, high blood pressure (hypertension), and high cholesterol. These are features of metabolic syndrome and increase your risk for stroke, diabetes, and heart disease. What are the causes? This condition may be caused by:  Drinking too much alcohol.  Poor nutrition.  Obesity.  Cushing's syndrome.  Diabetes.  High cholesterol.  Certain drugs.  Poisons.  Some viral infections.  Pregnancy. What increases the risk? You are more likely to develop this condition if you:  Abuse alcohol.  Are overweight.  Have diabetes.  Have hepatitis.  Have a high triglyceride level.  Are pregnant. What are the signs or symptoms? Fatty liver disease often does not cause symptoms. If symptoms do develop, they can include:  Fatigue.  Weakness.  Weight  loss.  Confusion.  Abdominal pain.  Nausea and vomiting.  Yellowing of your skin and the white parts of your eyes (jaundice).  Itchy skin. How is this diagnosed? This condition may be diagnosed by:  A physical exam and medical history.  Blood tests.  Imaging tests, such as an ultrasound, CT scan, or MRI.  A liver biopsy. A small sample of liver tissue is removed using a needle. The sample is then looked at under a microscope. How is this treated? Fatty liver disease is often caused by other health conditions. Treatment for fatty liver may involve medicines and lifestyle changes to manage conditions such as:  Alcoholism.  High cholesterol.  Diabetes.  Being overweight or obese. Follow these instructions at home:   Do not drink alcohol. If you have trouble quitting, ask your health care provider how to safely quit with the help of medicine or a supervised program. This is important to keep your condition from getting worse.  Eat a healthy diet as told by your health care provider. Ask your health care provider about working with a diet and nutrition specialist (dietitian) to develop an eating plan.  Exercise regularly. This can help you lose weight and control your cholesterol and diabetes. Talk to your health care provider about an exercise plan and which activities are best for you.  Take over-the-counter and prescription medicines only as told by your health care provider.  Keep all follow-up  visits as told by your health care provider. This is important. Contact a health care provider if: You have trouble controlling your:  Blood sugar. This is especially important if you have diabetes.  Cholesterol.  Drinking of alcohol. Get help right away if:  You have abdominal pain.  You have jaundice.  You have nausea and vomiting.  You vomit blood or material that looks like coffee grounds.  You have stools that are black, tar-like, or bloody. Summary  Fatty  liver disease develops when too much fat builds up in the cells of your liver.  Fatty liver disease often causes no symptoms or problems. However, over time, fatty liver can cause inflammation that may lead to more serious liver problems, such as scarring of the liver (cirrhosis).  You are more likely to develop this condition if you abuse alcohol, are pregnant, are overweight, have diabetes, have hepatitis, or have high triglyceride levels.  Contact your health care provider if you have trouble controlling your weight, blood sugar, cholesterol, or drinking of alcohol. This information is not intended to replace advice given to you by your health care provider. Make sure you discuss any questions you have with your health care provider. Document Revised: 01/18/2017 Document Reviewed: 11/14/2016 Elsevier Patient Education  2020 [employer].

## 2019-11-17 ENCOUNTER — Other Ambulatory Visit: Payer: Self-pay | Admitting: Adult Health

## 2019-11-17 LAB — COMPLETE METABOLIC PANEL WITH GFR
AG Ratio: 1.8 (calc) (ref 1.0–2.5)
ALT: 48 U/L — ABNORMAL HIGH (ref 9–46)
AST: 59 U/L — ABNORMAL HIGH (ref 10–35)
Albumin: 4.5 g/dL (ref 3.6–5.1)
Alkaline phosphatase (APISO): 42 U/L (ref 35–144)
BUN/Creatinine Ratio: 17 (calc) (ref 6–22)
BUN: 23 mg/dL (ref 7–25)
CO2: 26 mmol/L (ref 20–32)
Calcium: 10.2 mg/dL (ref 8.6–10.3)
Chloride: 103 mmol/L (ref 98–110)
Creat: 1.32 mg/dL — ABNORMAL HIGH (ref 0.70–1.25)
GFR, Est African American: 66 mL/min/{1.73_m2} (ref >=60)
GFR, Est Non African American: 57 mL/min/{1.73_m2} — ABNORMAL LOW (ref >=60)
Globulin: 2.5 g/dL (calc) (ref 1.9–3.7)
Glucose, Bld: 94 mg/dL (ref 65–99)
Potassium: 4.5 mmol/L (ref 3.5–5.3)
Sodium: 138 mmol/L (ref 135–146)
Total Bilirubin: 0.7 mg/dL (ref 0.2–1.2)
Total Protein: 7 g/dL (ref 6.1–8.1)

## 2019-11-17 LAB — HEMOGLOBIN A1C
Hgb A1c MFr Bld: 5.1 % of total Hgb (ref <5.7)
Mean Plasma Glucose: 100 (calc)
eAG (mmol/L): 5.5 (calc)

## 2019-11-17 LAB — CBC WITH DIFFERENTIAL/PLATELET
Absolute Monocytes: 725 {cells}/uL (ref 200–950)
Basophils Absolute: 10 {cells}/uL (ref 0–200)
Basophils Relative: 0.2 %
Eosinophils Absolute: 172 {cells}/uL (ref 15–500)
Eosinophils Relative: 3.5 %
HCT: 39.5 % (ref 38.5–50.0)
Hemoglobin: 13 g/dL — ABNORMAL LOW (ref 13.2–17.1)
Lymphs Abs: 1142 {cells}/uL (ref 850–3900)
MCH: 32.3 pg (ref 27.0–33.0)
MCHC: 32.9 g/dL (ref 32.0–36.0)
MCV: 98.3 fL (ref 80.0–100.0)
MPV: 10.7 fL (ref 7.5–12.5)
Monocytes Relative: 14.8 %
Neutro Abs: 2852 {cells}/uL (ref 1500–7800)
Neutrophils Relative %: 58.2 %
Platelets: 196 10*3/uL (ref 140–400)
RBC: 4.02 Million/uL — ABNORMAL LOW (ref 4.20–5.80)
RDW: 12.8 % (ref 11.0–15.0)
Total Lymphocyte: 23.3 %
WBC: 4.9 10*3/uL (ref 3.8–10.8)

## 2019-11-17 LAB — LIPID PANEL
Cholesterol: 139 mg/dL (ref <200)
HDL: 62 mg/dL (ref >=40)
LDL Cholesterol (Calc): 63 mg/dL (calc)
Non-HDL Cholesterol (Calc): 77 mg/dL (calc) (ref <130)
Total CHOL/HDL Ratio: 2.2 (calc) (ref <5.0)
Triglycerides: 62 mg/dL (ref <150)

## 2019-11-17 LAB — TSH: TSH: 1.02 mIU/L (ref 0.40–4.50)

## 2019-11-17 LAB — MAGNESIUM: Magnesium: 1.3 mg/dL — ABNORMAL LOW (ref 1.5–2.5)

## 2019-11-17 MED ORDER — MAGNESIUM 250 MG PO TABS: 1.0000 | ORAL_TABLET | Freq: Two times a day (BID) | ORAL | 1 refills | Status: DC

## 2019-11-19 ENCOUNTER — Other Ambulatory Visit: Payer: Self-pay | Admitting: Internal Medicine

## 2019-11-19 ENCOUNTER — Ambulatory Visit: Payer: 59 | Admitting: Internal Medicine

## 2019-11-20 ENCOUNTER — Other Ambulatory Visit: Payer: Self-pay | Admitting: Internal Medicine

## 2019-11-20 MED ORDER — TAMSULOSIN HCL 0.4 MG PO CAPS
ORAL_CAPSULE | ORAL | 0 refills | Status: DC
Start: 2019-11-20 — End: 2020-11-11

## 2019-12-01 ENCOUNTER — Ambulatory Visit
Admission: RE | Admit: 2019-12-01 | Discharge: 2019-12-01 | Disposition: A | Discharge status: Home / Self Care | Payer: 59 | Source: Ambulatory Visit | Attending: Adult Health | Admitting: Adult Health

## 2019-12-01 DIAGNOSIS — R7989 Other specified abnormal findings of blood chemistry: Secondary | ICD-10-CM

## 2019-12-22 ENCOUNTER — Other Ambulatory Visit: Payer: Self-pay | Admitting: Internal Medicine

## 2019-12-22 DIAGNOSIS — F419 Anxiety disorder, unspecified: Secondary | ICD-10-CM

## 2019-12-22 MED ORDER — ALPRAZOLAM 1 MG PO TABS: ORAL_TABLET | ORAL | 0 refills | Status: DC

## 2020-01-12 ENCOUNTER — Other Ambulatory Visit: Payer: Self-pay | Admitting: Internal Medicine

## 2020-03-22 ENCOUNTER — Other Ambulatory Visit: Payer: Self-pay | Admitting: Internal Medicine

## 2020-03-22 DIAGNOSIS — F419 Anxiety disorder, unspecified: Secondary | ICD-10-CM

## 2020-03-22 MED ORDER — ALPRAZOLAM 1 MG PO TABS: ORAL_TABLET | ORAL | 0 refills | Status: DC

## 2020-04-11 ENCOUNTER — Other Ambulatory Visit: Payer: Self-pay | Admitting: Internal Medicine

## 2020-04-11 ENCOUNTER — Other Ambulatory Visit: Payer: Self-pay | Admitting: Adult Health

## 2020-04-11 DIAGNOSIS — I10 Essential (primary) hypertension: Secondary | ICD-10-CM

## 2020-04-11 DIAGNOSIS — E782 Mixed hyperlipidemia: Secondary | ICD-10-CM

## 2020-04-11 MED ORDER — DOXAZOSIN MESYLATE 8 MG PO TABS: ORAL_TABLET | ORAL | 0 refills | Status: DC

## 2020-05-02 ENCOUNTER — Other Ambulatory Visit: Payer: Self-pay | Admitting: Internal Medicine

## 2020-05-02 DIAGNOSIS — E349 Endocrine disorder, unspecified: Secondary | ICD-10-CM

## 2020-05-02 MED ORDER — TESTOSTERONE CYPIONATE 200 MG/ML IM SOLN: INTRAMUSCULAR | 1 refills | Status: DC

## 2020-05-16 ENCOUNTER — Encounter: Payer: Self-pay | Admitting: Internal Medicine

## 2020-05-16 NOTE — Progress Notes (Signed)
Annual  Screening/Preventative Visit  & Comprehensive Evaluation & Examination                                          This very nice 65 y.o. MWM presents for a Screening /Preventative Visit & comprehensive evaluation and management of multiple medical co-morbidities.  Patient has been followed for HTN, HLD, Prediabetes and Vitamin D Deficiency.      HTN predates since     . Patient's BP has been controlled at home.  Today's BP has high normal diastolic - 401/02.  In 2009, patient had a Negative /Normal Heart Cath. Then in 2012, patient presented with ACS with PCA and DES planted.  Patient has HTCVD with CKD3a  (GFR 57).   Patient denies any cardiac symptoms as chest pain, palpitations, shortness of breath, dizziness or ankle swelling.      Patient's hyperlipidemia is controlled with diet and Atorvastatin. Patient denies myalgias or other medication SE's. Last lipids were at goal:  Lab Results  Component Value Date   CHOL 139 11/16/2019   HDL 62 11/16/2019   LDLCALC 63 11/16/2019   TRIG 62 11/16/2019   CHOLHDL 2.2 11/16/2019        Patient has hx/o prediabetes  (A1c 5.8% /2008) and patient denies reactive hypoglycemic symptoms, visual blurring, diabetic polys or paresthesias. Last A1c was normal & at goal:   Lab Results  Component Value Date   HGBA1C 5.1 11/16/2019                   Patient has Low Testosterone (level "308" with Nl range 350-890) and has been on parenteral replacement since Aug 2009 administering his own injections. Patient reports improved sense of well-being on treatment.       Finally, patient has history of Vitamin D Deficiency ("19" /2009) and last vitamin D was at goal:    Lab Results  Component Value Date   VD25OH 98 05/08/2019    Current Outpatient Medications on File Prior to Visit  Medication Sig  . acetaminophen (TYLENOL) 325 MG tablet Take 2 tablets every 4 hours as needed for mild pain   . ALPRAZolam  1 MG tablet Take  1/2-1 tablet   ONLY  if needed for Sleep   . VITAMIN C Take 1 tablet\ daily as needed\  . ASPIRIN 81 PO Take  daily.   Marland Kitchen atenolol \\100  MG tablet Take  1 tablet  Daily  for BP  . atorvastatin 40 MG tablet TAKE 1 TABLET DAILY   . buPROPion-XL 300 MG 24 hr tablet Take  1 tablet  every Morning    . Clopidogrel  75 MG tablet Take 1 tablet Daily to prevent Blood Clots  . cyclobenzaprine  10 MG tablet Take 1/2 to 1 tablet 3 x /day as needed   . diclofenac 1 % GEL Apply 2 to 4 grams 2 to 4 x / day  . diltiazem CD 240 MG Take  1 capsule  Daily  for BP  . doxazosin  8 MG tablet Take  1 tablet   at Bedtime  . furosemide  80 MG tablet TAKE 1 TABLET  EVERY DAY   . Magnesium 400 MG CAPS Take 2 capsules  daily.  . meloxicam 5 MG tablet TAKE 1/2 TO 1 TABLET EVERY DAY   . Multiple Vitamin  Take 2 tablets  daily  .  NITROSTAT 0.4 MG SL tablet  as needed for Chest Pain  . olmesartan  40 MG tablet Take 1/2  to 1 tablet Daily for BP  . pantoprazole40 MG tablet Take 1 tablet Daily   . tadalafil (CIALIS) 20 MG tablet Take 1/2 to 1 tablet every 2 to 3 days as needed   . tamsulosin 0.4 MG CAPS capsule Take  1 capsule at Bedtime  . testosterone cypio 200  Mg/ml injec Inject 2 ml IM every 2 weeks . . . or . . . 1 ml IM every week     Allergies  Allergen Reactions  . Hctz [Hydrochlorothiazide]   . Tape Rash    Past Medical History:  Diagnosis Date  . Anxiety   . Atrial flutter, paroxysmal ([place]) 01/04/2011  . CAD (coronary artery disease), native coronary artery 01/04/2011  . Diverticulitis   . Diverticulosis   . GERD (gastroesophageal reflux disease)   . History of meniscectomy of right knee 01/02/2011  . Hypertension   . NSVT (nonsustained ventricular tachycardia) ([place]) 01/02/2011  . Primary osteoarthritis of right knee 04/03/2017  . Stented coronary artery 01/04/2011   LAD DES Resolute.    Health Maintenance  Topic Date Due  . INFLUENZA VACCINE  09/20/2019  . COVID-19 Vaccine (3 - Booster for Pfizer series)  12/12/2019  . PNA vac Low Risk Adult (1 of 2 - PCV13) Never done  . COLONOSCOPY (Pts 45-27yrs Insurance coverage will need to be confirmed)  10/25/2020  . TETANUS/TDAP  02/28/2025  . Hepatitis C Screening  Completed  . HIV Screening  Completed  . HPV VACCINES  Aged Out    Immunization History  Administered Date(s) Administered  . Influenza Split 12/16/2013, 12/02/2014  . Influenza Whole 12/09/2012  . Influenza, Seasonal, Injecte, Preservative Fre 12/01/2015  . Influenza-Unspecified 11/19/2016, 11/30/2017  . PFIZER(Purple Top)SARS-COV-2 Vaccination 05/21/2019, 06/12/2019  . PPD Test 11/05/2013, 01/04/2015, 02/10/2016, 03/21/2017, 04/16/2018  . Td 02/19/2005  . Tdap 03/01/2015    Last Colon - 10/26/2010 - Dr Deatra Ina recc 10 yr f/u due Sept 2022.  Past Surgical History:  Procedure Laterality Date  . APPENDECTOMY    . CARPAL TUNNEL RELEASE Right 2021   Dr. Grandville Silos  . COLONOSCOPY    . CORONARY ANGIOPLASTY WITH STENT PLACEMENT  2012  . ELBOW SURGERY Right 2021   Cyst excision/nerve decompression, Dr. Grandville Silos  . HAND SURGERY     right  . KNEE ARTHROSCOPY     x 8 left  . KNEE ARTHROSCOPY Right 04/21/2012   Procedure: ARTHROSCOPY KNEE;  Surgeon: Lorn Junes, MD;  Location: [facility];  Service: Orthopedics;  Laterality: Right;  medial and lateral meniscectomies  . LEFT HEART CATHETERIZATION \ N/A 01/03/2011   P\LEFT HEART CATHETERIZATION \\Jonathan  Adora Fridge, MD  . PERCUTANEOUS CORONARY STENT INTERVENTION (PCI-S) N/A 01/03/2011   PERCUTANEOUS CORONARY STENT INTERVENTION (PCI-S);  Surgeon: Lorretta Harp, MD;  Location: [facility];  Service: Cardiovascular;  Laterality: N/A;  . ROTATOR CUFF REPAIR     bilateral  . TONSILLECTOMY    . TOTAL KNEE ARTHROPLASTY Right 04/15/2017  . TOTAL KNEE ARTHROPLASTY Right 04/15/2017   Procedure: TOTAL KNEE ARTHROPLASTY;  Surgeon: Elsie Saas, MD;  Location: [facility];  Service: Orthopedics;  Laterality: Right;  . TRICEPS TENDON REPAIR Bilateral      Family History  Problem Relation Age of Onset  . Diabetes Mother   . Heart disease Mother   . Heart attack Mother   . Heart disease Father   .  Coronary artery disease Father   . Obesity Brother   . Diabetes Brother   . HIV/AIDS Brother   . Hypertension Brother     Social History   Socioeconomic History  . Marital status: Married    Spouse name: Maudie Mercury   . Number of children: 2 children  Occupational History  . 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 Smoking status: Never Smoker  . Smokeless tobacco: Never Used  Vaping Use  . Vaping Use: Never used  Substance and Sexual Activity  . Alcohol use: Yes    Alcohol/week: 10.0 standard drinks    Types: 10 Cans of beer per week    Comment: 1-2 beers a night  . Drug use: No  . Sexual activity: Not on file    ROS Constitutional: Denies fever, chills, weight loss/gain, headaches, insomnia,  night sweats or change in appetite. Does c/o fatigue. Eyes: Denies redness, blurred vision, diplopia, discharge, itchy or watery eyes.  ENT: Denies discharge, congestion, post nasal drip, epistaxis, sore throat, earache, hearing loss, dental pain, Tinnitus, Vertigo, Sinus pain or snoring.  Cardio: Denies chest pain, palpitations, irregular heartbeat, syncope, dyspnea, diaphoresis, orthopnea, PND, claudication or edema Respiratory: denies cough, dyspnea, DOE, pleurisy, hoarseness, laryngitis or wheezing.  Gastrointestinal: Denies dysphagia, heartburn, reflux, water brash, pain, cramps, nausea, vomiting, bloating, diarrhea, constipation, hematemesis, melena, hematochezia, jaundice or hemorrhoids Genitourinary: Denies dysuria, frequency, urgency, nocturia, hesitancy, discharge, hematuria or flank pain Musculoskeletal: Denies arthralgia, myalgia, stiffness, Jt. Swelling, pain, limp or strain/sprain. Denies Falls. Skin: Denies puritis, rash, hives, warts, acne, eczema or change in skin lesion Neuro: No  weakness, tremor, incoordination, spasms, paresthesia or pain Psychiatric: Denies confusion, memory loss or sensory loss. Denies Depression. Endocrine: Denies change in weight, skin, hair change, nocturia, and paresthesia, diabetic polys, visual blurring or hyper / hypo glycemic episodes.  Heme/Lymph: No excessive bleeding, bruising or enlarged lymph nodes.  Physical Exam  BP 126/90   Pulse (!) 54   Temp 98.1 F (36.7 C)   Resp 16   Ht 5\' 7"  (1.702 m)   Wt 193 lb (87.5 kg)   SpO2 99%   BMI 30.23 kg/m   General Appearance: Well nourished and well groomed and in no apparent distress.  Eyes: PERRLA, EOMs, conjunctiva no swelling or erythema, normal fundi and vessels. Sinuses: No frontal/maxillary tenderness ENT/Mouth: EACs patent / TMs  nl. Nares clear without erythema, swelling, mucoid exudates. Oral hygiene is good. No erythema, swelling, or exudate. Tongue normal, non-obstructing. Tonsils not swollen or erythematous. Hearing normal.  Neck: Supple, thyroid not palpable. No bruits, nodes or JVD. Respiratory: Respiratory effort normal.  BS equal and clear bilateral without rales, rhonci, wheezing or stridor. Cardio: Heart sounds are normal with regular rate and rhythm and no murmurs, rubs or gallops. Peripheral pulses are normal and equal bilaterally without edema. No aortic or femoral bruits. Chest: symmetric with normal excursions and percussion.  Abdomen: Soft, with Nl bowel sounds. Nontender, no guarding, rebound, hernias, masses, or organomegaly.  Lymphatics: Non tender without lymphadenopathy.  Musculoskeletal: Full ROM all peripheral extremities, joint stability, 5/5 strength, and normal gait. Skin: Warm and dry without rashes, lesions, cyanosis, clubbing or  ecchymosis.  Neuro: Cranial nerves intact, reflexes equal bilaterally. Normal muscle tone, no cerebellar symptoms. Sensation intact.  Pysch: Alert and oriented X 3 with normal affect, insight and judgment appropriate.    Assessment and Plan  1. Annual Preventative/Screening Exam    2. Essential hypertension  -  EKG 12-Lead - [place], RETROPERITNL ABD,  LTD - Urinalysis, Routine w reflex microscopic - Microalbumin / creatinine urine ratio - CBC with Differential/Platelet - COMPLETE METABOLIC PANEL WITH GFR - Magnesium - TSH  3. Hyperlipidemia, mixed  - EKG 12-Lead - [place], RETROPERITNL ABD,  LTD - Lipid panel - TSH  4. Abnormal glucose  - EKG 12-Lead - [place], RETROPERITNL ABD,  LTD - Hemoglobin A1c - Insulin, random  5. Vitamin D deficiency  - VITAMIN D 25 Hydroxy   6. Coronary artery disease involving native coronary  artery of native heart without angina pectoris  - EKG 12-Lead - Lipid panel  7. Chronic renal impairment, stage 3a (HCC)  - Urinalysis, Routine w reflex microscopic - Microalbumin / creatinine urine ratio - PTH, intact and calcium - COMPLETE METABOLIC PANEL WITH GFR  8. Gastroesophageal reflux disease  - CBC with Differential/Platelet  9. Testosterone deficiency  - Testosterone  10. BPH with obstruction/lower urinary tract symptoms  - PSA  11. Screening for colorectal cancer  - POC Hemoccult Bld/Stl   12. Prostate cancer screening  - PSA  13. Screening for ischemic heart disease  - EKG 12-Lead  14. FHx: heart disease  - EKG 12-Lead - [place], RETROPERITNL ABD,  LTD  15. Screening for abdominal aortic aneurysm  - [place], RETROPERITNL ABD,  LTD  16. Medication management  - Urinalysis, Routine w reflex microscopic - Microalbumin / creatinine urine ratio - Testosterone - CBC with Differential/Platelet - COMPLETE METABOLIC PANEL WITH GFR - Magnesium - Lipid panel - TSH - Hemoglobin A1c - Insulin, random - VITAMIN D 25 Hydroxy  17. Fatigue  - Testosterone - CBC with Differential/Platelet - TSH        Patient was counseled in prudent diet, weight control to achieve/maintain BMI less than 25, BP monitoring, regular exercise and medications as  discussed.  Discussed med effects and SE's. Routine screening labs and tests as requested with regular follow-up as recommended. Over 40 minutes of exam, counseling, chart review and high complex critical decision making was performed   Kirtland Bouchard, MD

## 2020-05-16 NOTE — Patient Instructions (Signed)

## 2020-05-17 ENCOUNTER — Other Ambulatory Visit: Payer: Self-pay

## 2020-05-17 ENCOUNTER — Ambulatory Visit: Payer: 59 | Admitting: Internal Medicine

## 2020-05-17 VITALS — BP 126/90 | HR 54 | Temp 98.1°F | Resp 16 | Ht 67.0 in | Wt 193.0 lb

## 2020-05-17 DIAGNOSIS — E782 Mixed hyperlipidemia: Secondary | ICD-10-CM

## 2020-05-17 DIAGNOSIS — Z0000 Encounter for general adult medical examination without abnormal findings: Secondary | ICD-10-CM | POA: Diagnosis not present

## 2020-05-17 DIAGNOSIS — Z8249 Family history of ischemic heart disease and other diseases of the circulatory system: Secondary | ICD-10-CM

## 2020-05-17 DIAGNOSIS — E559 Vitamin D deficiency, unspecified: Secondary | ICD-10-CM

## 2020-05-17 DIAGNOSIS — N401 Enlarged prostate with lower urinary tract symptoms: Secondary | ICD-10-CM

## 2020-05-17 DIAGNOSIS — R7309 Other abnormal glucose: Secondary | ICD-10-CM

## 2020-05-17 DIAGNOSIS — R5383 Other fatigue: Secondary | ICD-10-CM

## 2020-05-17 DIAGNOSIS — N138 Other obstructive and reflux uropathy: Secondary | ICD-10-CM

## 2020-05-17 DIAGNOSIS — I10 Essential (primary) hypertension: Secondary | ICD-10-CM | POA: Diagnosis not present

## 2020-05-17 DIAGNOSIS — I2510 Atherosclerotic heart disease of native coronary artery without angina pectoris: Secondary | ICD-10-CM | POA: Diagnosis not present

## 2020-05-17 DIAGNOSIS — E349 Endocrine disorder, unspecified: Secondary | ICD-10-CM

## 2020-05-17 DIAGNOSIS — Z136 Encounter for screening for cardiovascular disorders: Secondary | ICD-10-CM | POA: Diagnosis not present

## 2020-05-17 DIAGNOSIS — Z125 Encounter for screening for malignant neoplasm of prostate: Secondary | ICD-10-CM

## 2020-05-17 DIAGNOSIS — Z1211 Encounter for screening for malignant neoplasm of colon: Secondary | ICD-10-CM

## 2020-05-17 DIAGNOSIS — N1831 Chronic kidney disease, stage 3a: Secondary | ICD-10-CM

## 2020-05-17 DIAGNOSIS — K219 Gastro-esophageal reflux disease without esophagitis: Secondary | ICD-10-CM

## 2020-05-17 DIAGNOSIS — Z79899 Other long term (current) drug therapy: Secondary | ICD-10-CM

## 2020-05-17 DIAGNOSIS — Z0001 Encounter for general adult medical examination with abnormal findings: Secondary | ICD-10-CM

## 2020-05-17 NOTE — Progress Notes (Signed)
AortaScan < 3 cm. Within normal limits, per Dr McKeown. 

## 2020-05-18 LAB — COMPLETE METABOLIC PANEL WITH GFR
AG Ratio: 1.7 (calc) (ref 1.0–2.5)
ALT: 30 U/L (ref 9–46)
AST: 39 U/L — ABNORMAL HIGH (ref 10–35)
Albumin: 4.2 g/dL (ref 3.6–5.1)
Alkaline phosphatase (APISO): 36 U/L (ref 35–144)
BUN: 19 mg/dL (ref 7–25)
CO2: 27 mmol/L (ref 20–32)
Calcium: 9.7 mg/dL (ref 8.6–10.3)
Chloride: 103 mmol/L (ref 98–110)
Creat: 1.25 mg/dL (ref 0.70–1.25)
GFR, Est African American: 70 mL/min/{1.73_m2} (ref >=60)
GFR, Est Non African American: 60 mL/min/{1.73_m2} (ref >=60)
Globulin: 2.5 g/dL (calc) (ref 1.9–3.7)
Glucose, Bld: 97 mg/dL (ref 65–99)
Potassium: 4.2 mmol/L (ref 3.5–5.3)
Sodium: 138 mmol/L (ref 135–146)
Total Bilirubin: 0.6 mg/dL (ref 0.2–1.2)
Total Protein: 6.7 g/dL (ref 6.1–8.1)

## 2020-05-18 LAB — TSH: TSH: 1.04 mIU/L (ref 0.40–4.50)

## 2020-05-18 LAB — LIPID PANEL
Cholesterol: 145 mg/dL (ref <200)
HDL: 75 mg/dL (ref >=40)
LDL Cholesterol (Calc): 56 mg/dL (calc)
Non-HDL Cholesterol (Calc): 70 mg/dL (calc) (ref <130)
Total CHOL/HDL Ratio: 1.9 (calc) (ref <5.0)
Triglycerides: 48 mg/dL (ref <150)

## 2020-05-18 LAB — URINALYSIS, ROUTINE W REFLEX MICROSCOPIC
Bilirubin Urine: NEGATIVE
Glucose, UA: NEGATIVE
Hgb urine dipstick: NEGATIVE
Ketones, ur: NEGATIVE
Leukocytes,Ua: NEGATIVE
Nitrite: NEGATIVE
Protein, ur: NEGATIVE
Specific Gravity, Urine: 1.009 (ref 1.001–1.03)
pH: 7 (ref 5.0–8.0)

## 2020-05-18 LAB — CBC WITH DIFFERENTIAL/PLATELET
Absolute Monocytes: 734 cells/uL (ref 200–950)
Basophils Absolute: 20 cells/uL (ref 0–200)
Basophils Relative: 0.4 %
Eosinophils Absolute: 102 cells/uL (ref 15–500)
Eosinophils Relative: 2 %
HCT: 36.8 % — ABNORMAL LOW (ref 38.5–50.0)
Hemoglobin: 11.6 g/dL — ABNORMAL LOW (ref 13.2–17.1)
Lymphs Abs: 1387 cells/uL (ref 850–3900)
MCH: 28 pg (ref 27.0–33.0)
MCHC: 31.5 g/dL — ABNORMAL LOW (ref 32.0–36.0)
MCV: 88.7 fL (ref 80.0–100.0)
MPV: 10.2 fL (ref 7.5–12.5)
Monocytes Relative: 14.4 %
Neutro Abs: 2856 cells/uL (ref 1500–7800)
Neutrophils Relative %: 56 %
Platelets: 237 10*3/uL (ref 140–400)
RBC: 4.15 10*6/uL — ABNORMAL LOW (ref 4.20–5.80)
RDW: 14.5 % (ref 11.0–15.0)
Total Lymphocyte: 27.2 %
WBC: 5.1 10*3/uL (ref 3.8–10.8)

## 2020-05-18 LAB — PTH, INTACT AND CALCIUM
Calcium: 9.7 mg/dL (ref 8.6–10.3)
PTH: 9 pg/mL — ABNORMAL LOW (ref 16–77)

## 2020-05-18 LAB — PSA: PSA: 1.16 ng/mL (ref <=4.0)

## 2020-05-18 LAB — MICROALBUMIN / CREATININE URINE RATIO
Creatinine, Urine: 55 mg/dL (ref 20–320)
Microalb, Ur: <0.2 mg/dL

## 2020-05-18 LAB — HEMOGLOBIN A1C
Hgb A1c MFr Bld: 5.2 % of total Hgb (ref <5.7)
Mean Plasma Glucose: 103 mg/dL
eAG (mmol/L): 5.7 mmol/L

## 2020-05-18 LAB — MAGNESIUM: Magnesium: 1.5 mg/dL (ref 1.5–2.5)

## 2020-05-18 LAB — VITAMIN D 25 HYDROXY (VIT D DEFICIENCY, FRACTURES): Vit D, 25-Hydroxy: 126 ng/mL — ABNORMAL HIGH (ref 30–100)

## 2020-05-18 LAB — TESTOSTERONE: Testosterone: 1658 ng/dL — ABNORMAL HIGH (ref 250–827)

## 2020-05-18 LAB — INSULIN, RANDOM: Insulin: 5.3 u[IU]/mL

## 2020-05-18 NOTE — Progress Notes (Signed)
============================================================ - Test results slightly outside the reference range are not unusual. If there is anything important, I will review this with you,  otherwise it is considered normal test values.  If you have further questions,  please do not hesitate to contact me at the office or via My Chart.  ============================================================ ============================================================  -  Vitamin D = 126 - Elevated - Stop for 1 week   - Please let me know how much Vitamin D that you are taking , so                                                                          I can recommend dose change  ============================================================ ============================================================  -  PSA - Low - Great  ! ============================================================ ============================================================  -  Testosterone elevated from recent injection ============================================================ ============================================================  -  PTH - is a hormone that regulated calcium balance & is Normal & OK  ============================================================ ============================================================  -  CBC shows mild anemia again which is most likely due to your Protonix  relatively reducing Iron absorption by lowering acid levels needed  to  absorb iron, So Recommend take an OTC iron supplement 2 x /day  ============================================================ ============================================================  -  Kidney functions still low stage 2 & stable.  ============================================================ ============================================================  -   Magnesium  -   1.5  -  STILL very  low- goal is betw 2.0 - 2.5,   - So..............Marland Kitchen   Recommend that you take                                                       Magnesium 500 mg tablet - 2 tablets Daily  - also important to eat lots of  leafy green vegetables   - spinach - Kale - collards - greens - okra - asparagus  - broccoli - quinoa - squash - almonds   - black, red, white beans  -  peas - green beans ============================================================ ============================================================  -  Total chol = 145 and LDL Chol = 56 - Both  Excellent   - Very low risk for Heart Attack  / Stroke ========================================================  - Thyroid Normal  ============================================================ ============================================================  -  All Else - CBC - Kidneys - Electrolytes - Liver - Magnesium & Thyroid    - all  Normal / OK ===========================================================   -  Vitamin D = 126 - Elevated - Stop for 1 week   - Please let me know how much Vitamin D that you are taking , so                                                                          I can recommend dose change  ============================================================ ============================================================

## 2020-06-04 ENCOUNTER — Other Ambulatory Visit: Payer: Self-pay | Admitting: Internal Medicine

## 2020-06-04 MED ORDER — FINASTERIDE 5 MG PO TABS: ORAL_TABLET | ORAL | 3 refills | Status: DC

## 2020-06-20 ENCOUNTER — Other Ambulatory Visit: Payer: Self-pay | Admitting: Internal Medicine

## 2020-06-20 DIAGNOSIS — F419 Anxiety disorder, unspecified: Secondary | ICD-10-CM

## 2020-06-20 MED ORDER — ALPRAZOLAM 1 MG PO TABS
ORAL_TABLET | ORAL | 0 refills | Status: DC
Start: 2020-06-20 — End: 2020-09-20

## 2020-07-10 ENCOUNTER — Other Ambulatory Visit: Payer: Self-pay | Admitting: Internal Medicine

## 2020-07-10 DIAGNOSIS — I10 Essential (primary) hypertension: Secondary | ICD-10-CM

## 2020-07-10 DIAGNOSIS — K219 Gastro-esophageal reflux disease without esophagitis: Secondary | ICD-10-CM

## 2020-07-19 ENCOUNTER — Other Ambulatory Visit: Payer: Self-pay | Admitting: Internal Medicine

## 2020-07-19 DIAGNOSIS — E349 Endocrine disorder, unspecified: Secondary | ICD-10-CM

## 2020-07-26 ENCOUNTER — Other Ambulatory Visit: Payer: Self-pay

## 2020-07-26 ENCOUNTER — Ambulatory Visit: Payer: 59 | Attending: Internal Medicine

## 2020-07-26 ENCOUNTER — Other Ambulatory Visit (HOSPITAL_BASED_OUTPATIENT_CLINIC_OR_DEPARTMENT_OTHER): Payer: Self-pay

## 2020-07-26 DIAGNOSIS — Z23 Encounter for immunization: Secondary | ICD-10-CM

## 2020-07-26 MED ORDER — PFIZER-BIONT COVID-19 VAC-TRIS 30 MCG/0.3ML IM SUSP
INTRAMUSCULAR | 0 refills | Status: DC
  Filled 2020-07-26: qty 0.3, 1d supply, fill #0

## 2020-07-26 NOTE — Progress Notes (Signed)
   Covid-19 Vaccination Clinic  Name:  Marvin Carter    MRN: [id number] DOB: 07/20/1955  07/26/2020  [name] was observed post Covid-19 immunization for 15 minutes without incident. He was provided with Vaccine Information Sheet and instruction to access the V-Safe system.   [name] was instructed to call 911 with any severe reactions post vaccine: Marland Kitchen Difficulty breathing  . Swelling of face and throat  . A fast heartbeat  . A bad rash all over body  . Dizziness and weakness   Immunizations Administered    Name Date Dose VIS Date Route   PFIZER Comrnaty(Gray TOP) Covid-19 Vaccine 07/26/2020 12:44 PM 0.3 mL 01/28/2020 Intramuscular   Manufacturer: [place]   Lot: [id number]   [place]: [phone]

## 2020-08-20 ENCOUNTER — Other Ambulatory Visit: Payer: Self-pay | Admitting: Internal Medicine

## 2020-08-20 DIAGNOSIS — N529 Male erectile dysfunction, unspecified: Secondary | ICD-10-CM

## 2020-08-20 MED ORDER — TADALAFIL 20 MG PO TABS: ORAL_TABLET | ORAL | 0 refills | Status: DC

## 2020-09-12 ENCOUNTER — Other Ambulatory Visit: Payer: Self-pay | Admitting: Internal Medicine

## 2020-09-12 DIAGNOSIS — I2510 Atherosclerotic heart disease of native coronary artery without angina pectoris: Secondary | ICD-10-CM

## 2020-09-12 DIAGNOSIS — I10 Essential (primary) hypertension: Secondary | ICD-10-CM

## 2020-09-12 MED ORDER — OLMESARTAN MEDOXOMIL 40 MG PO TABS
ORAL_TABLET | ORAL | 3 refills | Status: DC
Start: 2020-09-12 — End: 2022-03-15

## 2020-09-12 MED ORDER — CLOPIDOGREL BISULFATE 75 MG PO TABS
ORAL_TABLET | ORAL | 3 refills | Status: DC
Start: 2020-09-12 — End: 2021-08-31

## 2020-09-20 ENCOUNTER — Other Ambulatory Visit: Payer: Self-pay | Admitting: Internal Medicine

## 2020-09-20 DIAGNOSIS — F419 Anxiety disorder, unspecified: Secondary | ICD-10-CM

## 2020-09-20 MED ORDER — ALPRAZOLAM 1 MG PO TABS: ORAL_TABLET | ORAL | 0 refills | Status: DC

## 2020-10-07 ENCOUNTER — Encounter: Payer: Self-pay | Admitting: Gastroenterology

## 2020-10-25 ENCOUNTER — Encounter: Payer: Self-pay | Admitting: Gastroenterology

## 2020-10-25 ENCOUNTER — Telehealth: Payer: Self-pay

## 2020-10-25 ENCOUNTER — Ambulatory Visit (INDEPENDENT_AMBULATORY_CARE_PROVIDER_SITE_OTHER): Payer: 59 | Admitting: Gastroenterology

## 2020-10-25 VITALS — BP 110/70 | HR 61 | Ht 68.0 in | Wt 192.0 lb

## 2020-10-25 DIAGNOSIS — Z7902 Long term (current) use of antithrombotics/antiplatelets: Secondary | ICD-10-CM | POA: Diagnosis not present

## 2020-10-25 DIAGNOSIS — Z1211 Encounter for screening for malignant neoplasm of colon: Secondary | ICD-10-CM | POA: Diagnosis not present

## 2020-10-25 DIAGNOSIS — Z955 Presence of coronary angioplasty implant and graft: Secondary | ICD-10-CM | POA: Diagnosis not present

## 2020-10-25 DIAGNOSIS — Z1212 Encounter for screening for malignant neoplasm of rectum: Secondary | ICD-10-CM

## 2020-10-25 DIAGNOSIS — K219 Gastro-esophageal reflux disease without esophagitis: Secondary | ICD-10-CM

## 2020-10-25 MED ORDER — SUTAB 1479-225-188 MG PO TABS: 1.0000 | ORAL_TABLET | Freq: Once | ORAL | 0 refills | Status: AC

## 2020-10-25 NOTE — Telephone Encounter (Signed)
   Jeremi Kress [birth year] [id number]  Dear Dr.Berry:  We have scheduled the above named patient for a(n) Colonoscopy procedure. Our records show that (s)he is on anticoagulation therapy.  Please advise as to whether the patient may come off their therapy of Plavix 5 days prior to their procedure which is scheduled for 11/15/20.  Please route your response to [facility] or fax response to [phone].  Sincerely,    [facility]

## 2020-10-25 NOTE — Progress Notes (Signed)
HPI : Marvin Carter is a very pleasant 65 year old male history of coronary artery disease status post stenting in 2012 who presents to our clinic to discuss scheduling screening colonoscopy.  He went to his last colonoscopy in 2012 at our facility which was notable for internal hemorrhoids, but otherwise normal.  The patient denies any chronic GI symptoms to include abdominal pain, constipation, diarrhea or blood in the stool.  He used to see blood in the stool frequently, which is why he had a colonoscopy in 2012.  He has regular bowel movements usually 2 to 3/day.  His weight is stable. He has a history of GERD for which he takes Protonix.  This controls his symptoms very well.  He has no history of smoking and no family history of esophageal cancer. The patient underwent coronary stenting in 2012 and has been on dual antiplatelet therapy ever since then.  He has held his Plavix in the past for other surgeries such as his knee replacement.  He has not had any bleeding complications.  He denies any further heart problems since the stent was placed.  He denies any chronic symptoms of chest pain/tightness, shortness of breath or lightheadedness/dizziness.  He exercises regularly.    Past Medical History:  Diagnosis Date   Anxiety    Atrial flutter, paroxysmal ([place]) 01/04/2011   CAD (coronary artery disease), native coronary artery 01/04/2011   Diverticulitis    Diverticulosis    GERD (gastroesophageal reflux disease)    History of meniscectomy of right knee 01/02/2011   Hypertension    NSVT (nonsustained ventricular tachycardia) ([place]) 01/02/2011   Primary osteoarthritis of right knee 04/03/2017   Stented coronary artery 01/04/2011   LAD DES [place].     Past Surgical History:  Procedure Laterality Date   APPENDECTOMY     CARPAL TUNNEL RELEASE Right 2021   Dr. Grandville Silos   COLONOSCOPY     CORONARY ANGIOPLASTY WITH STENT PLACEMENT  2012   ELBOW SURGERY Right 2021   Cyst excision/nerve  decompression, Dr. Grandville Silos   HAND SURGERY     right   KNEE ARTHROSCOPY     x 8 left   KNEE ARTHROSCOPY Right 04/21/2012   Procedure: ARTHROSCOPY KNEE;  Surgeon: Lorn Junes, MD;  Location: [facility];  Service: Orthopedics;  Laterality: Right;  medial and lateral meniscectomies   LEFT HEART CATHETERIZATION WITH CORONARY ANGIOGRAM N/A 01/03/2011   Procedure: LEFT HEART CATHETERIZATION WITH CORONARY ANGIOGRAM;  Surgeon: Lorretta Harp, MD;  Location: [facility] CATH LAB;  Service: Cardiovascular;  Laterality: N/A;   PERCUTANEOUS CORONARY STENT INTERVENTION (PCI-S) N/A 01/03/2011   Procedure: PERCUTANEOUS CORONARY STENT INTERVENTION (PCI-S);  Surgeon: Lorretta Harp, MD;  Location: [facility] CATH LAB;  Service: Cardiovascular;  Laterality: N/A;   ROTATOR CUFF REPAIR     bilateral   TONSILLECTOMY     TOTAL KNEE ARTHROPLASTY Right 04/15/2017   TOTAL KNEE ARTHROPLASTY Right 04/15/2017   Procedure: TOTAL KNEE ARTHROPLASTY;  Surgeon: Elsie Saas, MD;  Location: [place];  Service: Orthopedics;  Laterality: Right;   TRICEPS TENDON REPAIR Bilateral    Family History  Problem Relation Age of Onset   Diabetes Mother    Heart disease Mother    Heart attack Mother    Heart disease Father    Coronary artery disease Father    Obesity Brother    Diabetes Brother    HIV/AIDS Brother    Hypertension Brother    Social History   Tobacco Use   Smoking status:  Never   Smokeless tobacco: Never  Vaping Use   Vaping Use: Never used  Substance Use Topics   Alcohol use: Yes    Alcohol/week: 10.0 standard drinks    Types: 10 Cans of beer per week    Comment: 1-2 beers a night   Drug use: No   Current Outpatient Medications  Medication Sig Dispense Refill   acetaminophen (TYLENOL) 325 MG tablet Take 2 tablets (650 mg total) by mouth every 4 (four) hours as needed for mild pain ((score 1 to 3) or temp > 100.5).     ALPRAZolam (XANAX) 1 MG tablet Take  1/2-1 tablet  ONLY  if needed for Sleep &  limit to 5 days  /week to avoid Addiction & Dementia 90 tablet 0   Ascorbic Acid (VITAMIN C PO) Take 1 tablet by mouth daily as needed (for immune system support).     ASPIRIN 81 PO Take 81 mg by mouth daily.      atenolol (TENORMIN) 100 MG tablet Take  1 tablet  Daily  for BP 90 tablet 3   atorvastatin (LIPITOR) 40 MG tablet TAKE 1 TABLET BY MOUTH DAILY FOR CHOLESTEROL 90 tablet 3   buPROPion (WELLBUTRIN XL) 300 MG 24 hr tablet Take 1 tablet every Morning for Mood, Focus AND Concentration 90 tablet 3   clopidogrel (PLAVIX) 75 MG tablet Take 1 tablet Daily to prevent Blood Clots 90 tablet 3   COVID-19 mRNA Vac-TriS, Pfizer, (PFIZER-BIONT COVID-19 VAC-TRIS) SUSP injection Inject into the muscle. 0.3 mL 0   cyclobenzaprine (FLEXERIL) 10 MG tablet Take 1/2 to 1 tablet 3 x /day as needed for Muscle Spasm 90 tablet 3   diclofenac sodium (VOLTAREN) 1 % GEL Apply 2 to 4 grams 2 to 4 x / day 100 g 11   diltiazem (CARDIZEM CD) 240 MG 24 hr capsule Take 1 tablet  Daily  for BP 90 capsule 3   doxazosin (CARDURA) 8 MG tablet Take  1 tablet   at Bedtime  for BP &Prostate 90 tablet 3   finasteride (PROSCAR) 5 MG tablet Take  1 tablet  Daily  for Prostate 90 tablet 3   furosemide (LASIX) 80 MG tablet TAKE 1 TABLET BY MOUTH EVERY DAY FOR BLOOD PRESSURE AND FLUID RETENTION/ANKLE SWELLING 90 tablet 3   Magnesium 400 MG CAPS Take 2 capsules by mouth daily.     meloxicam (MOBIC) 15 MG tablet TAKE 1/2 TO 1 TABLET BY MOUTH EVERY DAY WITH FOOD FOR PAIN AND INFLAMMATION AND LIMIT TO 4-5 DAYS/WEEK TO AVOID KIDNEY DAMAGE 90 tablet 3   Multiple Vitamin (MULTIVITAMIN WITH MINERALS) TABS Take 2 tablets by mouth daily. Mega Men Multivitamin.     nitroGLYCERIN (NITROSTAT) 0.4 MG SL tablet Dissolve 1 tablet under tongue every 3 to 5 minutes as needed for Chest Pain 25 tablet 99   olmesartan (BENICAR) 40 MG tablet Take 1/2  to 1 tablet Daily for BP 90 tablet 3   pantoprazole (PROTONIX) 40 MG tablet TAKE 1 TABLET BY MOUTH DAILY FOR ACID  INDIGESTION& HEART BURN 90 tablet 3   tadalafil (CIALIS) 20 MG tablet Take 1/2 to 1 tablet every 2 to 3 days as needed for XXXX 30 tablet 0   tamsulosin (FLOMAX) 0.4 MG CAPS capsule Take     1 capsule      at Bedtime       for Prostate 90 capsule 0   testosterone cypionate (DEPOTESTOSTERONE CYPIONATE) 200 MG/ML injection INJECT 2 MLS INTRAMUSCULARLY EVERY 2  WEEKS 10 mL 2   No current facility-administered medications for this visit.   Allergies  Allergen Reactions   Hctz [Hydrochlorothiazide]    Tape Rash    Clear tape     Review of Systems: All systems reviewed and negative except where noted in HPI.    No results found.  Physical Exam: BP 110/70   Pulse 61   Ht '5\' 8"'$  (1.727 m)   Wt 192 lb (87.1 kg)   BMI 29.19 kg/m  Constitutional: Pleasant,well-developed, Caucasian male in no acute distress. HEENT: Normocephalic and atraumatic. Conjunctivae are normal. No scleral icterus.  Mallampati 2 Cardiovascular: Normal rate, regular rhythm.  Pulmonary/chest: Effort normal and breath sounds normal. No wheezing, rales or rhonchi. Abdominal: Soft, nondistended, nontender. Bowel sounds active throughout. There are no masses palpable. No hepatomegaly. Extremities: no edema Neurological: Alert and oriented to person place and time. Skin: Skin is warm and dry. No rashes noted. Psychiatric: Normal mood and affect. Behavior is normal.  CBC    Component Value Date/Time   WBC 5.1 05/17/2020 1006   RBC 4.15 (L) 05/17/2020 1006   HGB 11.6 (L) 05/17/2020 1006   HCT 36.8 (L) 05/17/2020 1006   PLT 237 05/17/2020 1006   MCV 88.7 05/17/2020 1006   MCH 28.0 05/17/2020 1006   MCHC 31.5 (L) 05/17/2020 1006   RDW 14.5 05/17/2020 1006   LYMPHSABS 1,387 05/17/2020 1006   MONOABS 1.1 (H) 04/04/2017 1140   EOSABS 102 05/17/2020 1006   BASOSABS 20 05/17/2020 1006    CMP     Component Value Date/Time   NA 138 05/17/2020 1006   K 4.2 05/17/2020 1006   CL 103 05/17/2020 1006   CO2 27  05/17/2020 1006   GLUCOSE 97 05/17/2020 1006   BUN 19 05/17/2020 1006   CREATININE 1.25 05/17/2020 1006   CALCIUM 9.7 05/17/2020 1006   CALCIUM 9.7 05/17/2020 1006   PROT 6.7 05/17/2020 1006   ALBUMIN 4.1 04/04/2017 1140   AST 39 (H) 05/17/2020 1006   ALT 30 05/17/2020 1006   ALKPHOS 45 04/04/2017 1140   BILITOT 0.6 05/17/2020 1006   GFRNONAA 60 05/17/2020 1006   GFRAA 70 05/17/2020 1006     ASSESSMENT AND PLAN: 65 year old male due for average risk screening colonoscopy.  He had a colonoscopy 10 years ago which was unremarkable.  He denies any chronic GI symptoms.  He has no family history of colon cancer.  He is on dual antiplatelet therapy for a remote history of a coronary stent.  He has held his Plavix in the past for surgical procedures without problems.  I recommend we proceed with scheduling a routine screening colonoscopy.  We will send a letter to his cardiologist to ensure that it is okay for him to hold his Plavix for 5 to 7 days in the perioperative period.  The patient can continue taking his Protonix for his GERD.  He is not obese, has no smoking history and no family history of esophageal cancer.  He is low risk for Barrett's.  Colon cancer screening - Schedule for screening colonoscopy - Hold Plavix 5 days prior to a colonoscopy  GERD - Continue Protonix daily  The details, risks (including bleeding, perforation, infection, missed lesions, medication reactions and possible hospitalization or surgery if complications occur), benefits, and alternatives to colonoscopy with possible biopsy and possible polypectomy were discussed with the patient and he consents to proceed.    Agueda Houpt E. [name], MD [facility]   CC: Unk Pinto, MD

## 2020-10-25 NOTE — Patient Instructions (Signed)
If you are age 66 or older, your body mass index should be between 23-30. Your Body mass index is 29.19 kg/m. If this is out of the aforementioned range listed, please consider follow up with your Primary Care Provider.  If you are age 56 or younger, your body mass index should be between 19-25. Your Body mass index is 29.19 kg/m. If this is out of the aformentioned range listed, please consider follow up with your Primary Care Provider.   You have been scheduled for a colonoscopy. Please follow written instructions given to you at your visit today.  Please pick up your prep supplies at the pharmacy within the next 1-3 days. If you use inhalers (even only as needed), please bring them with you on the day of your procedure.   The [place] [facility] providers would like to encourage you to use [facility] to communicate with providers for non-urgent requests or questions.  Due to long hold times on the telephone, sending your provider a message by [facility] may be a faster and more efficient way to get a response.  Please allow 48 business hours for a response.  Please remember that this is for non-urgent requests.   It was a pleasure to see you today!  Thank you for trusting me with your gastrointestinal care!    Scott E.[name], MD

## 2020-10-26 NOTE — Telephone Encounter (Signed)
Left VM for patient to call back

## 2020-10-28 NOTE — Telephone Encounter (Signed)
Spoke with patient and let him know to hold Plavix 5 days prior to his procedure. Patient stated understanding and had no questions at the end of call.

## 2020-11-11 ENCOUNTER — Other Ambulatory Visit: Payer: Self-pay | Admitting: Adult Health

## 2020-11-15 ENCOUNTER — Encounter: Payer: Self-pay | Admitting: Gastroenterology

## 2020-11-15 ENCOUNTER — Other Ambulatory Visit: Payer: Self-pay

## 2020-11-15 ENCOUNTER — Ambulatory Visit (AMBULATORY_SURGERY_CENTER): Payer: 59 | Admitting: Gastroenterology

## 2020-11-15 VITALS — BP 118/71 | HR 60 | Temp 98.9°F | Resp 13 | Ht 68.0 in | Wt 192.0 lb

## 2020-11-15 DIAGNOSIS — Z1211 Encounter for screening for malignant neoplasm of colon: Secondary | ICD-10-CM

## 2020-11-15 DIAGNOSIS — Z1212 Encounter for screening for malignant neoplasm of rectum: Secondary | ICD-10-CM | POA: Diagnosis not present

## 2020-11-15 DIAGNOSIS — K635 Polyp of colon: Secondary | ICD-10-CM

## 2020-11-15 DIAGNOSIS — D125 Benign neoplasm of sigmoid colon: Secondary | ICD-10-CM

## 2020-11-15 DIAGNOSIS — D123 Benign neoplasm of transverse colon: Secondary | ICD-10-CM

## 2020-11-15 MED ORDER — SODIUM CHLORIDE 0.9 % IV SOLN: 500.0000 mL | Freq: Once | INTRAVENOUS | Status: DC

## 2020-11-15 NOTE — Progress Notes (Signed)
History and Physical Interval Note:  No changes in the patient's medical history or symptoms since his clinic visit Sept 6th.  Last dose of Plavix Sept 22nd  11/15/2020 8:20 AM  Marvin Carter  has presented today for endoscopic procedure(s), with the diagnosis of  Encounter Diagnosis  Name Primary?   Screening for colorectal cancer Yes  .  The various methods of evaluation and treatment have been discussed with the patient and/or family. After consideration of risks, benefits and other options for treatment, the patient has consented to  the endoscopic procedure(s).   The patient's history has been reviewed, patient examined, no change in status, stable for endoscopic procedure(s).  I have reviewed the patient's chart and labs.  Questions were answered to the patient's satisfaction.    Donika Butner E. [name], MD [facility]

## 2020-11-15 NOTE — Progress Notes (Signed)
Called to room to assist during endoscopic procedure.  Patient ID and intended procedure confirmed with present staff. Received instructions for my participation in the procedure from the performing physician.  

## 2020-11-15 NOTE — Progress Notes (Signed)
Report given to PACU, vss 

## 2020-11-15 NOTE — Op Note (Addendum)
[place] Patient Name: Marvin Carter Procedure Date: 11/15/2020 8:21 AM MRN: [id number] Endoscopist: Nicki Reaper E. [name] , MD Age: 65 Referring MD:  Date of Birth: May 19, 1955 Gender: Male Account #: [id number] Procedure:                Colonoscopy Indications:              Screening for colorectal malignant neoplasm Medicines:                Monitored Anesthesia Care Procedure:                Pre-Anesthesia Assessment:                           - Prior to the procedure, a History and Physical                            was performed, and patient medications and                            allergies were reviewed. The patient's tolerance of                            previous anesthesia was also reviewed. The risks                            and benefits of the procedure and the sedation                            options and risks were discussed with the patient.                            All questions were answered, and informed consent                            was obtained. Prior Anticoagulants: The patient has                            taken Plavix (clopidogrel), last dose was 5 days                            prior to procedure. ASA Grade Assessment: III - A                            patient with severe systemic disease. After                            reviewing the risks and benefits, the patient was                            deemed in satisfactory condition to undergo the                            procedure.  After obtaining informed consent, the colonoscope                            was passed under direct vision. Throughout the                            procedure, the patient's blood pressure, pulse, and                            oxygen saturations were monitored continuously. The                            CF [id number] #[id number] was introduced through the anus                            and advanced to the the terminal ileum, with                             identification of the appendiceal orifice and IC                            valve. The colonoscopy was performed without                            difficulty. The patient tolerated the procedure                            well. The quality of the bowel preparation was                            adequate. The terminal ileum, ileocecal valve,                            appendiceal orifice, and rectum were photographed. Scope In: 8:35:32 AM Scope Out: 8:47:46 AM Scope Withdrawal Time: 0 hours 10 minutes 34 seconds  Total Procedure Duration: 0 hours 12 minutes 14 seconds  Findings:                 Hemorrhoids were found on perianal exam.                           The digital rectal exam was normal. Pertinent                            negatives include normal sphincter tone and no                            palpable rectal lesions.                           A 3 mm polyp was found in the transverse colon. The                            polyp was sessile. The polyp was removed with a  cold snare. Resection and retrieval were complete.                            Estimated blood loss was minimal.                           A 2 mm polyp was found in the sigmoid colon. The                            polyp was flat. The polyp was removed with a cold                            snare. Resection and retrieval were complete.                            Estimated blood loss was minimal.                           The exam was otherwise normal throughout the                            examined colon.                           Non-bleeding internal hemorrhoids were found during                            retroflexion. The hemorrhoids were Grade I                            (internal hemorrhoids that do not prolapse).                           The terminal ileum appeared normal. Complications:            No immediate complications. Estimated Blood Loss:      Estimated blood loss: none. Impression:               - External hemorrhoids found on perianal exam.                           - One 3 mm polyp in the transverse colon, removed                            with a cold snare. Resected and retrieved.                           - One 2 mm polyp in the sigmoid colon, removed with                            a cold snare. Resected and retrieved.                           - Non-bleeding internal hemorrhoids.                           -  The examined portion of the ileum was normal. Recommendation:           - Patient has a contact number available for                            emergencies. The signs and symptoms of potential                            delayed complications were discussed with the                            patient. Return to normal activities tomorrow.                            Written discharge instructions were provided to the                            patient.                           - Resume previous diet.                           - Continue present medications.                           - Okay to resume Plavix tomorrow.                           - Repeat colonoscopy in 10 years for surveillance. Stina Gane E. [name], MD 11/15/2020 8:54:51 AM This report has been signed electronically.

## 2020-11-15 NOTE — Progress Notes (Signed)
Vitals-CW ?

## 2020-11-15 NOTE — Patient Instructions (Signed)
Resume previous diet and continue present medications. Repeat Colonoscopy in 10 years for surveillance.  YOU HAD AN ENDOSCOPIC PROCEDURE TODAY AT [facility]:   Refer to the procedure report that was given to you for any specific questions about what was found during the examination.  If the procedure report does not answer your questions, please call your gastroenterologist to clarify.  If you requested that your care partner not be given the details of your procedure findings, then the procedure report has been included in a sealed envelope for you to review at your convenience later.  YOU SHOULD EXPECT: Some feelings of bloating in the abdomen. Passage of more gas than usual.  Walking can help get rid of the air that was put into your GI tract during the procedure and reduce the bloating. If you had a lower endoscopy (such as a colonoscopy or flexible sigmoidoscopy) you may notice spotting of blood in your stool or on the toilet paper. If you underwent a bowel prep for your procedure, you may not have a normal bowel movement for a few days.  Please Note:  You might notice some irritation and congestion in your nose or some drainage.  This is from the oxygen used during your procedure.  There is no need for concern and it should clear up in a day or so.  SYMPTOMS TO REPORT IMMEDIATELY:  Following lower endoscopy (colonoscopy or flexible sigmoidoscopy):  Excessive amounts of blood in the stool  Significant tenderness or worsening of abdominal pains  Swelling of the abdomen that is new, acute  Fever of 100F or higher  For urgent or emergent issues, a gastroenterologist can be reached at any hour by calling [phone]. Do not use MyChart messaging for urgent concerns.    DIET:  We do recommend a small meal at first, but then you may proceed to your regular diet.  Drink plenty of fluids but you should avoid alcoholic beverages for 24 hours.  ACTIVITY:  You should plan to  take it easy for the rest of today and you should NOT DRIVE or use heavy machinery until tomorrow (because of the sedation medicines used during the test).    FOLLOW UP: Our staff will call the number listed on your records 48-72 hours following your procedure to check on you and address any questions or concerns that you may have regarding the information given to you following your procedure. If we do not reach you, we will leave a message.  We will attempt to reach you two times.  During this call, we will ask if you have developed any symptoms of COVID 19. If you develop any symptoms (ie: fever, flu-like symptoms, shortness of breath, cough etc.) before then, please call [phone].  If you test positive for Covid 19 in the 2 weeks post procedure, please call and report this information to [place].    If any biopsies were taken you will be contacted by phone or by letter within the next 1-3 weeks.  Please call us at [phone] if you have not heard about the biopsies in 3 weeks.    SIGNATURES/CONFIDENTIALITY: You and/or your care partner have signed paperwork which will be entered into your electronic medical record.  These signatures attest to the fact that that the information above on your After Visit Summary has been reviewed and is understood.  Full responsibility of the confidentiality of this discharge information lies with you and/or your care-partner.

## 2020-11-17 ENCOUNTER — Telehealth: Payer: Self-pay

## 2020-11-17 NOTE — Telephone Encounter (Signed)
  Follow up Call-  Call back number 11/15/2020  Post procedure Call Back phone  # [phone]  Permission to leave phone message Yes  Some recent data might be hidden     Patient questions:  Do you have a fever, pain , or abdominal swelling? No. Pain Score  0 *  Have you tolerated food without any problems? Yes.    Have you been able to return to your normal activities? Yes.    Do you have any questions about your discharge instructions: Diet   No. Medications  No. Follow up visit  No.  Do you have questions or concerns about your Care? No.  Actions: * If pain score is 4 or above: No action needed, pain <4.  Have you developed a fever since your procedure? no  2.   Have you had an respiratory symptoms (SOB or cough) since your procedure? no  3.   Have you tested positive for COVID 19 since your procedure no  4.   Have you had any family members/close contacts diagnosed with the COVID 19 since your procedure?  no   If yes to any of these questions please route to Joylene John, RN and Joella Prince, RN

## 2020-11-18 IMAGING — US US ABDOMEN COMPLETE
1 series · 14 of 25 positions shown · non-contrast
Comparison: None.

CLINICAL DATA: Elevated liver function tests.

EXAM:
ABDOMEN ULTRASOUND COMPLETE

[Series 1: us abdomen complete · 0.20mm/px · 14 of 74 slices shown]
[im 1/74]
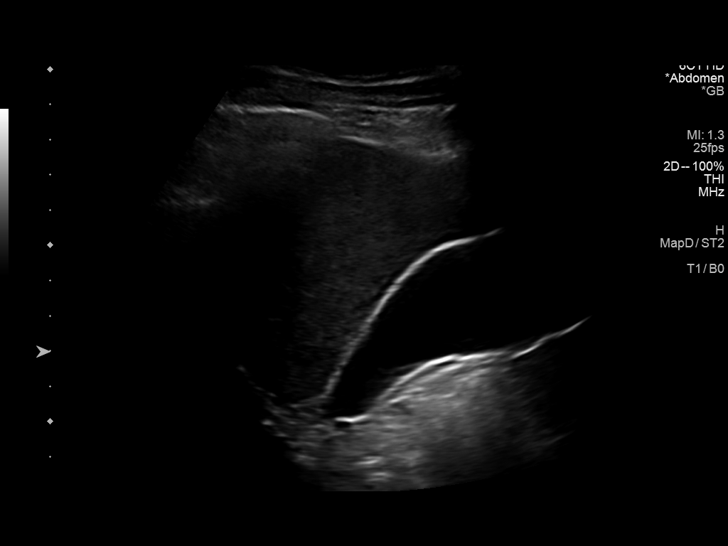
[im 7/74]
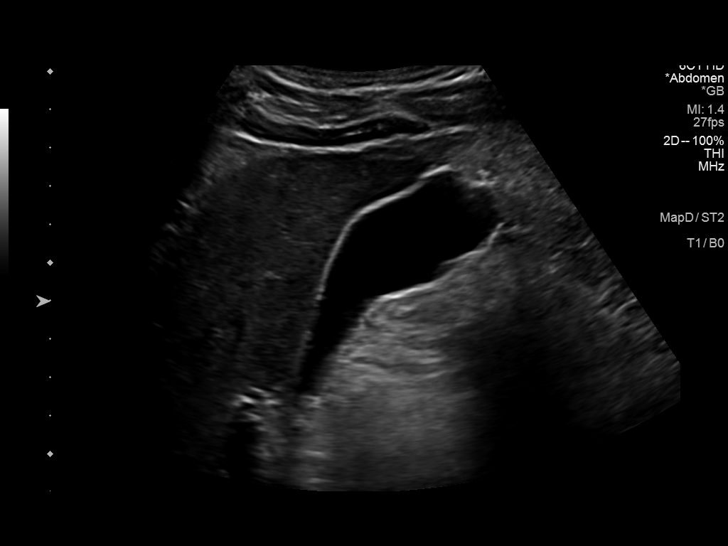
[im 13/74]
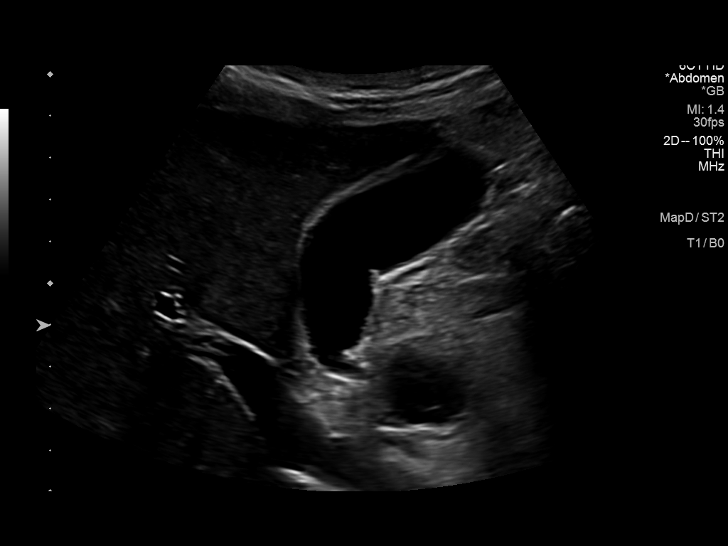
[im 19/74]
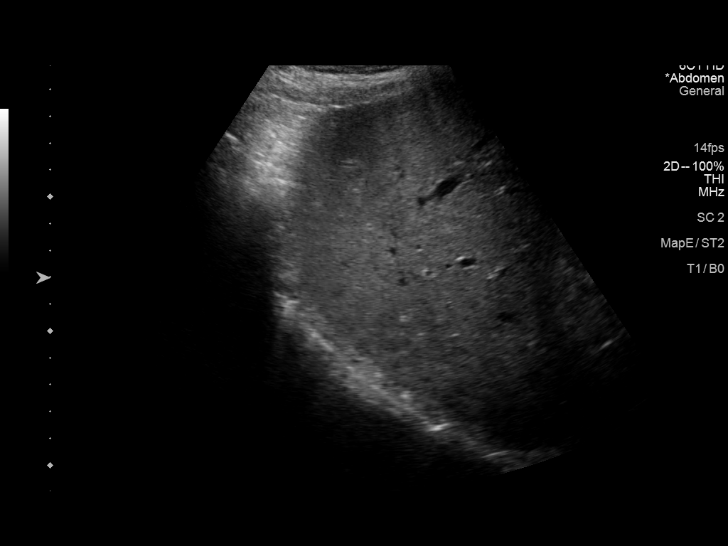
[im 25/74]
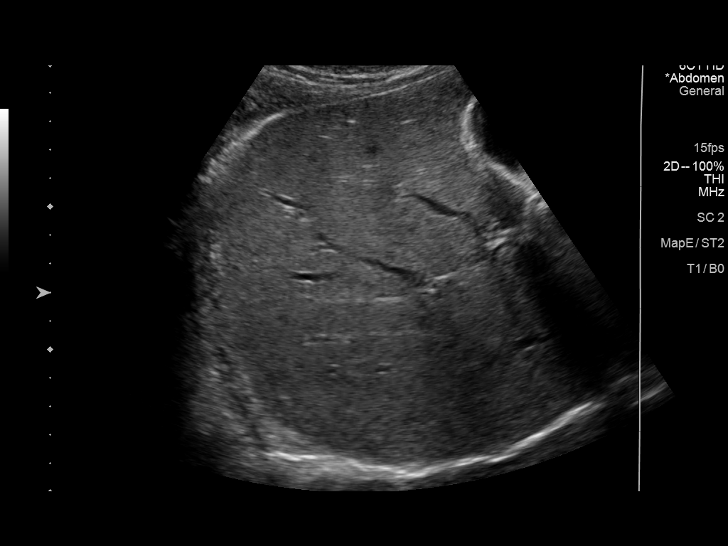
[im 28/74]
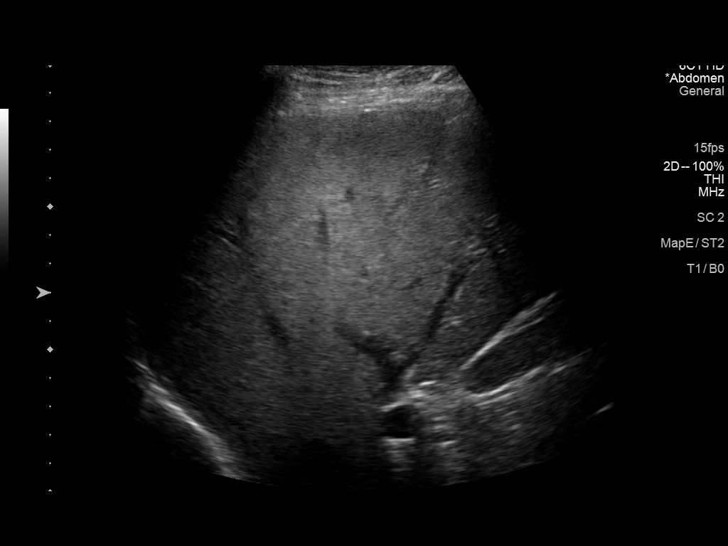
[im 34/74]
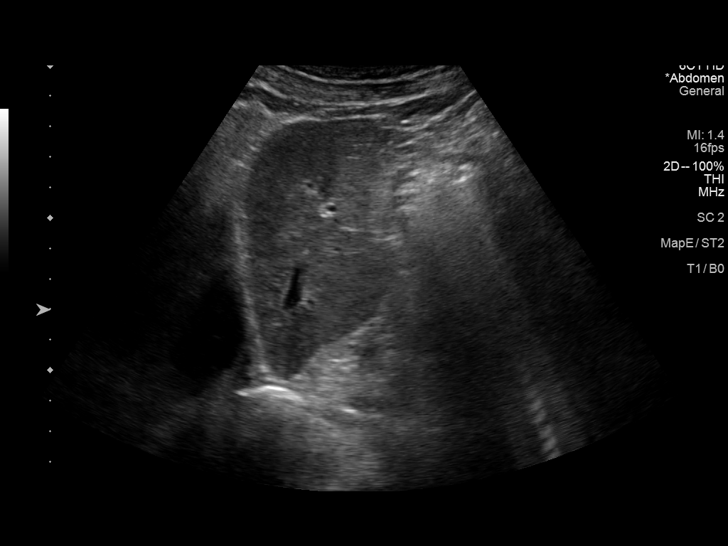
[im 40/74]
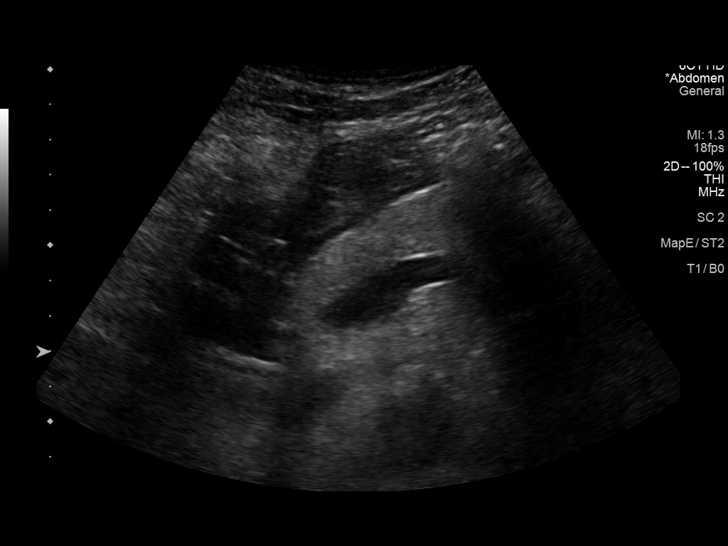
[im 46/74]
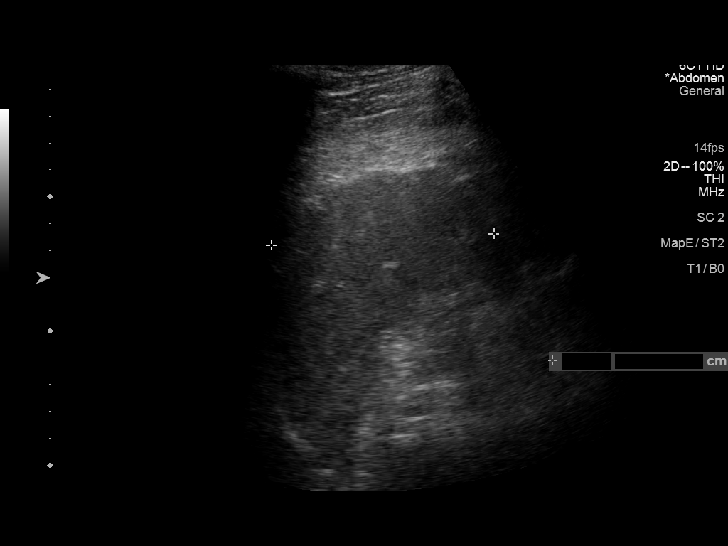
[im 49/74]
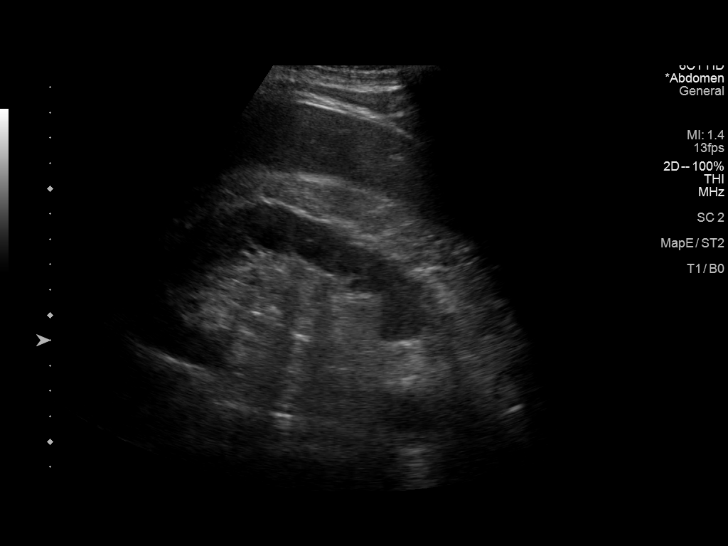
[im 55/74]
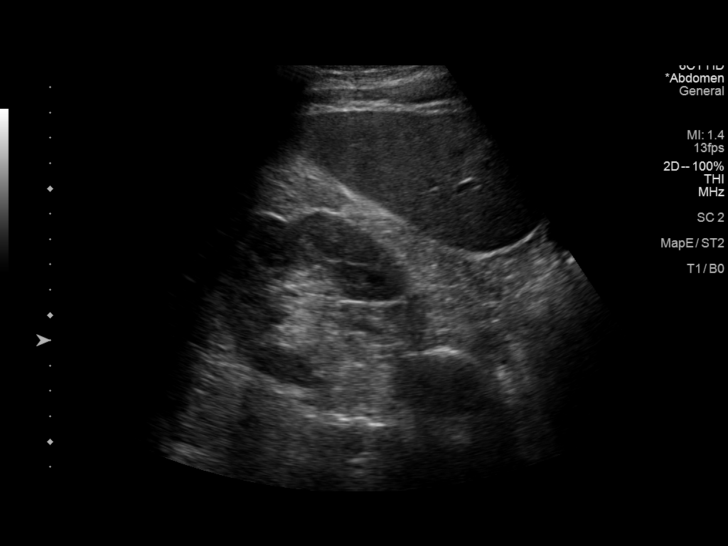
[im 61/74]
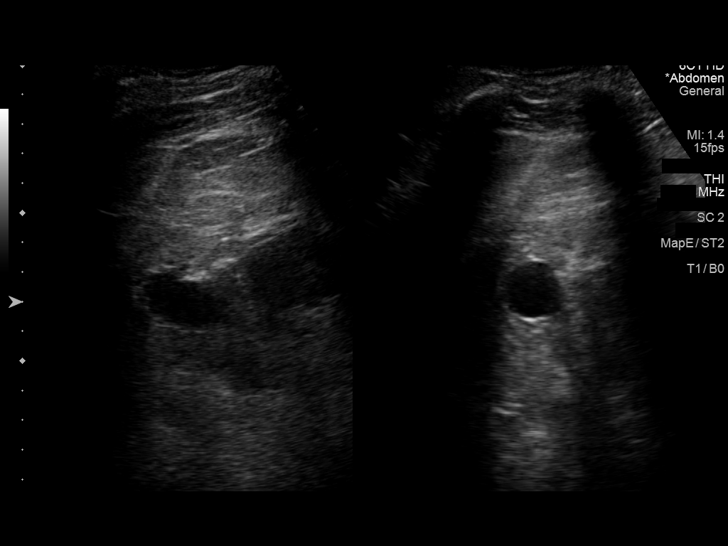
[im 67/74]
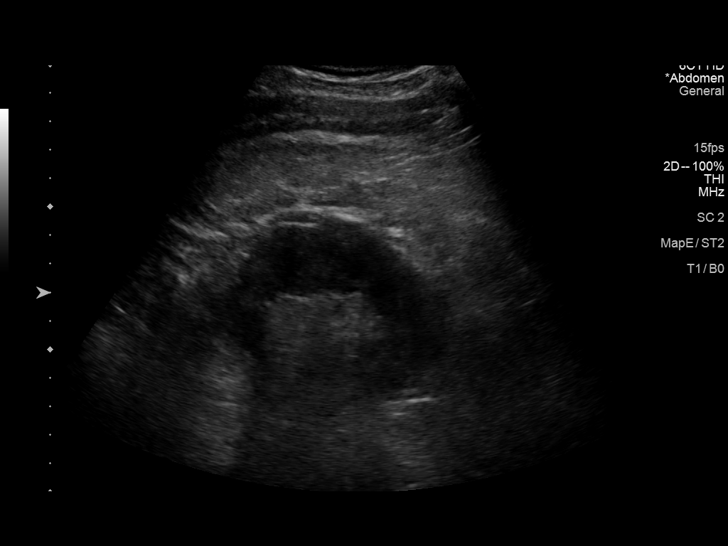
[im 74/74]
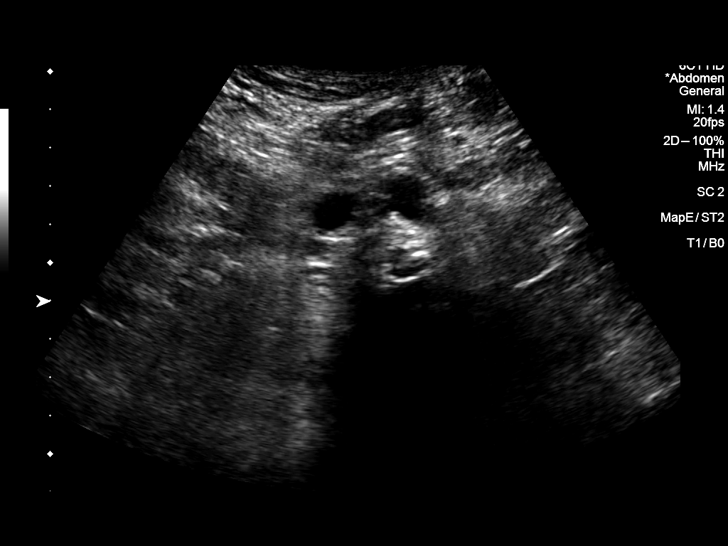

[14 of 25 positions shown; findings below may reference images not displayed]

FINDINGS: Gallbladder: No gallstones or wall thickening visualized. No
sonographic Murphy sign noted by sonographer.

Common bile duct: Diameter: 6 mm which is within normal limits.

Liver: No focal lesion identified. Within normal limits in
parenchymal echogenicity. Portal vein is patent on color Doppler
imaging with normal direction of blood flow towards the liver.

IVC: Not well visualized.

Pancreas: Visualized portion unremarkable.

Spleen: Size and appearance within normal limits.

Right Kidney: Length: 14.5 cm. Echogenicity within normal limits. No
mass or hydronephrosis visualized.

Left Kidney: Length: 13.5 cm. 3 cm simple cyst is noted.
Echogenicity within normal limits. No mass or hydronephrosis
visualized.

Abdominal aorta: No aneurysm visualized.

Other findings: None.
IMPRESSION: No significant abnormality seen in the abdomen.

## 2020-11-21 ENCOUNTER — Ambulatory Visit: Payer: 59 | Admitting: Adult Health

## 2020-11-21 ENCOUNTER — Other Ambulatory Visit: Payer: Self-pay | Admitting: Internal Medicine

## 2020-11-21 DIAGNOSIS — N529 Male erectile dysfunction, unspecified: Secondary | ICD-10-CM

## 2020-11-21 MED ORDER — TADALAFIL 20 MG PO TABS: ORAL_TABLET | ORAL | 1 refills | Status: DC

## 2020-11-21 NOTE — Progress Notes (Signed)
Future Appointments  Date Time Provider [place]  11/22/2020 11:30 AM Unk Pinto, MD [facility] None  05/22/2021  3:00 PM Unk Pinto, MD [facility] None    History of Present Illness:       This very nice 65 y.o. MWM presents for 6 month follow up with HTN, HLD, Pre-Diabetes and Vitamin D Deficiency.  Patient had Colonoscopy on 11/15/2020 By Dr Dustin Flock and recommended a 10 year f/u Colon.        Patient is treated for HTN  (1989) & BP has been controlled at home. Today's BP is borderline high - 130/90.  Patient had a Negative Heart Cath in 2009 and in 2012 presented with ACS and had PCA/Stent.  Patient has CKD2-3a  (GFR  56-60 ) attributed to his HTVD.  Patient has had no complaints of any cardiac type chest pain, palpitations, dyspnea / orthopnea / PND, dizziness, claudication, or dependent edema.       Hyperlipidemia is controlled with diet & Atorvastatin. Patient denies myalgias or other med SE's. Last Lipids were at goal:  Lab Results  Component Value Date   CHOL 145 05/17/2020   HDL 75 05/17/2020   LDLCALC 56 05/17/2020   TRIG 48 05/17/2020   CHOLHDL 1.9 05/17/2020     Also, the patient has history of PreDiabetes (A1c 5.8% /2008) and has had no symptoms of reactive hypoglycemia, diabetic polys, paresthesias or visual blurring.  Last A1c was normal & at goal:  Lab Results  Component Value Date   HGBA1C 5.2 05/17/2020           Patient has hx/o Testosterone Deficiency & is on parenteral replacement with improved sense of well being.                                                      Further, the patient also has history of Vitamin D Deficiency  ("19" /2009) and supplements vitamin D without any suspected side-effects. Last vitamin D was elevated & dose was decreased:  Lab Results  Component Value Date   VD25OH 126 (H) 05/17/2020     Current Outpatient Medications on File Prior to Visit  Medication Sig   acetaminophen (TYLENOL) 325 MG  tablet Take 2 tablets (650 mg total) by mouth every 4 (four) hours as needed for mild pain ((score 1 to 3) or temp > 100.5).   ALPRAZolam (XANAX) 1 MG tablet Take  1/2-1 tablet  ONLY  if needed for Sleep &  limit to 5 days /week to avoid Addiction & Dementia   Ascorbic Acid (VITAMIN C PO) Take 1 tablet by mouth daily as needed (for immune system support).   ASPIRIN 81 PO Take 81 mg by mouth daily.    atenolol (TENORMIN) 100 MG tablet Take  1 tablet  Daily  for BP   atorvastatin (LIPITOR) 40 MG tablet TAKE 1 TABLET BY MOUTH DAILY FOR CHOLESTEROL   buPROPion (WELLBUTRIN XL) 300 MG 24 hr tablet Take 1 tablet every Morning for Mood, Focus AND Concentration   clopidogrel (PLAVIX) 75 MG tablet Take 1 tablet Daily to prevent Blood Clots   COVID-19 mRNA Vac-TriS, Pfizer, (PFIZER-BIONT COVID-19 VAC-TRIS) SUSP injection Inject into the muscle. (Patient not taking: Reported on 11/15/2020)   cyclobenzaprine (FLEXERIL) 10 MG tablet Take 1/2 to 1 tablet 3 x /day as needed  for Muscle Spasm   diclofenac sodium (VOLTAREN) 1 % GEL Apply 2 to 4 grams 2 to 4 x / day (Patient not taking: Reported on 11/15/2020)   diltiazem (CARDIZEM CD) 240 MG 24 hr capsule Take 1 tablet  Daily  for BP   doxazosin (CARDURA) 8 MG tablet Take  1 tablet   at Bedtime  for BP &Prostate   finasteride (PROSCAR) 5 MG tablet Take  1 tablet  Daily  for Prostate   furosemide (LASIX) 80 MG tablet TAKE 1 TABLET BY MOUTH EVERY DAY FOR BLOOD PRESSURE AND FLUID RETENTION/ANKLE SWELLING   Magnesium 400 MG CAPS Take 2 capsules  daily.   meloxicam (MOBIC) 15 MG tablet TAKE 1/2 TO 1 TABLET EVERY DAY    Multiple Vitamin (MULTIVITAMIN WITH MINERALS) TABS Take 2 tablets by mouth daily. Mega Men Multivitamin.   NITROSTAT 0.4 MG SL tablet as needed for Chest Pain   olmesartan (BENICAR) 40 MG tablet Take 1/2  to 1 tablet Daily for BP   pantoprazole (PROTONIX) 40 MG tablet TAKE 1 TABLET BY MOUTH DAILY FOR ACID INDIGESTION& HEART BURN   tadalafil (CIALIS) 20 MG  tablet Take 1/2 to 1 tablet every 2 to 3 days as needed for XXXX   tamsulosin (FLOMAX) 0.4 MG CAPS capsule TAKE ONE CAPSULE BY MOUTH AT BEDTIME FOR PROSTATE/ URINE FLOW   testosterone cypionate (DEPOTESTOSTERONE CYPIONATE) 200 MG/ML injection INJECT 2 MLS INTRAMUSCULARLY EVERY 2 WEEKS    Allergies  Allergen Reactions   Hctz [Hydrochlorothiazide]    Tape Rash    Clear tape     PMHx:   Past Medical History:  Diagnosis Date   Anxiety    Atrial flutter, paroxysmal ([facility]) 01/04/2011   CAD (coronary artery disease), native coronary artery 01/04/2011   Diverticulitis    Diverticulosis    GERD (gastroesophageal reflux disease)    History of meniscectomy of right knee 01/02/2011   Hypertension    NSVT (nonsustained ventricular tachycardia) ([place]) 01/02/2011   Primary osteoarthritis of right knee 04/03/2017   Stented coronary artery 01/04/2011   LAD DES Resolute.     Immunization History  Administered Date(s) Administered   Influenza Split 12/16/2013, 12/02/2014   Influenza Whole 12/09/2012   Influenza, Seasonal, Injecte, Preservative Fre 12/01/2015   Influenza-Unspecified 11/19/2016, 11/30/2017   PFIZER Comirnaty(Gray Top)Covid-19 Tri-Sucrose Vaccine 07/26/2020   PFIZER(Purple Top)SARS-COV-2 Vaccination 05/21/2019, 06/12/2019   PPD Test 11/05/2013, 01/04/2015, 02/10/2016, 03/21/2017, 04/16/2018   Td 02/19/2005   Tdap 03/01/2015     Past Surgical History:  Procedure Laterality Date   APPENDECTOMY     CARPAL TUNNEL RELEASE Right 2021   Dr. Grandville Silos   COLONOSCOPY     CORONARY ANGIOPLASTY WITH STENT PLACEMENT  2012   ELBOW SURGERY Right 2021   Cyst excision/nerve decompression, Dr. Grandville Silos   HAND SURGERY     right   KNEE ARTHROSCOPY     x 8 left   KNEE ARTHROSCOPY Right 04/21/2012   Procedure: ARTHROSCOPY KNEE;  Surgeon: Lorn Junes, MD;  Location: [place];  Service: Orthopedics;  Laterality: Right;  medial and lateral meniscectomies   LEFT HEART CATHETERIZATION WITH  CORONARY ANGIOGRAM N/A 01/03/2011   Procedure: LEFT HEART CATHETERIZATION WITH CORONARY ANGIOGRAM;  Surgeon: Lorretta Harp, MD;  Location: [facility] CATH LAB;  Service: Cardiovascular;  Laterality: N/A;   PERCUTANEOUS CORONARY STENT INTERVENTION (PCI-S) N/A 01/03/2011   Procedure: PERCUTANEOUS CORONARY STENT INTERVENTION (PCI-S);  Surgeon: Lorretta Harp, MD;  Location[facility] CATH LAB;  Service: Cardiovascular;  Laterality:  N/A;   ROTATOR CUFF REPAIR     bilateral   TONSILLECTOMY     TOTAL KNEE ARTHROPLASTY Right 04/15/2017   TOTAL KNEE ARTHROPLASTY Right 04/15/2017   Procedure: TOTAL KNEE ARTHROPLASTY;  Surgeon: Elsie Saas, MD;  Location: [place];  Service: Orthopedics;  Laterality: Right;   TRICEPS TENDON REPAIR Bilateral     FHx:    Reviewed / unchanged  SHx:    Reviewed / unchanged   Systems Review:  Constitutional: Denies fever, chills, wt changes, headaches, insomnia, fatigue, night sweats, change in appetite. Eyes: Denies redness, blurred vision, diplopia, discharge, itchy, watery eyes.  ENT: Denies discharge, congestion, post nasal drip, epistaxis, sore throat, earache, hearing loss, dental pain, tinnitus, vertigo, sinus pain, snoring.  CV: Denies chest pain, palpitations, irregular heartbeat, syncope, dyspnea, diaphoresis, orthopnea, PND, claudication or edema. Respiratory: denies cough, dyspnea, DOE, pleurisy, hoarseness, laryngitis, wheezing.  Gastrointestinal: Denies dysphagia, odynophagia, heartburn, reflux, water brash, abdominal pain or cramps, nausea, vomiting, bloating, diarrhea, constipation, hematemesis, melena, hematochezia  or hemorrhoids. Genitourinary: Denies dysuria, frequency, urgency, nocturia, hesitancy, discharge, hematuria or flank pain. Musculoskeletal: Denies arthralgias, myalgias, stiffness, jt. swelling, pain, limping or strain/sprain.  Skin: Denies pruritus, rash, hives, warts, acne, eczema or change in skin lesion(s). Neuro: No weakness, tremor,  incoordination, spasms, paresthesia or pain. Psychiatric: Denies confusion, memory loss or sensory loss. Endo: Denies change in weight, skin or hair change.  Heme/Lymph: No excessive bleeding, bruising or enlarged lymph nodes.  Physical Exam  BP 130/90   Pulse 69   Temp (!) 97.5 F (36.4 C)   Resp 16   Ht 5\' 4"  (1.626 m)   Wt 186 lb 9.6 oz (84.6 kg)   SpO2 96%   BMI 32.03 kg/m   Appears  well nourished, well groomed  and in no distress.  Eyes: PERRLA, EOMs, conjunctiva no swelling or erythema. Sinuses: No frontal/maxillary tenderness ENT/Mouth: EAC's clear, TM's nl w/o erythema, bulging. Nares clear w/o erythema, swelling, exudates. Oropharynx clear without erythema or exudates. Oral hygiene is good. Tongue normal, non obstructing. Hearing intact.  Neck: Supple. Thyroid not palpable. Car 2+/2+ without bruits, nodes or JVD. Chest: Respirations nl with BS clear & equal w/o rales, rhonchi, wheezing or stridor.  Cor: Heart sounds normal w/ regular rate and rhythm without sig. murmurs, gallops, clicks or rubs. Peripheral pulses normal and equal  without edema.  Abdomen: Soft & bowel sounds normal. Non-tender w/o guarding, rebound, hernias, masses or organomegaly.  Lymphatics: Unremarkable.  Musculoskeletal: Full ROM all peripheral extremities, joint stability, 5/5 strength and normal gait.  Skin: Warm, dry without exposed rashes, lesions or ecchymosis apparent.  Neuro: Cranial nerves intact, reflexes equal bilaterally. Sensory-motor testing grossly intact. Tendon reflexes grossly intact.  Pysch: Alert & oriented x 3.  Insight and judgement nl & appropriate. No ideations.  Assessment and Plan:  1. Essential hypertension  - Continue medication, monitor blood pressure at home.  - Continue DASH diet.  Reminder to go to the ER if any CP,  SOB, nausea, dizziness, severe HA, changes vision/speech.   - CBC with Differential/Platelet - COMPLETE METABOLIC PANEL WITH GFR - Magnesium -  TSH  2. Hyperlipidemia, mixed  - Continue diet/meds, exercise,& lifestyle modifications.  - Continue monitor periodic cholesterol/liver & renal functions    - Lipid panel - TSH  3. Abnormal glucose  - Continue diet, exercise  - Lifestyle modifications.  - Monitor appropriate labs    - Hemoglobin A1c - Insulin, random  4. Vitamin D deficiency  - Continue supplementation.   -  VITAMIN D 25 Hydroxy   5. Coronary artery disease involving native coronary  artery of native heart without angina pectoris  - Lipid panel  6. Gastroesophageal reflux disease  - CBC with Differential/Platelet  7. Testosterone deficiency  - Testosterone  8. Medication management  - CBC with Differential/Platelet - COMPLETE METABOLIC PANEL WITH GFR - Magnesium - Lipid panel - TSH - Hemoglobin A1c - Insulin, random - VITAMIN D 25 Hydroxy         Discussed  regular exercise, BP monitoring, weight control to achieve/maintain BMI less than 25 and discussed med and SE's. Recommended labs to assess and monitor clinical status with further disposition pending results of labs.  I discussed the assessment and treatment plan with the patient. The patient was provided an opportunity to ask questions and all were answered. The patient agreed with the plan and demonstrated an understanding of the instructions.  I provided over 30 minutes of exam, counseling, chart review and  complex critical decision making.        The patient was advised to call back or seek an in-person evaluation if the symptoms worsen or if the condition fails to improve as anticipated.   Kirtland Bouchard, MD

## 2020-11-22 ENCOUNTER — Ambulatory Visit: Payer: 59 | Admitting: Internal Medicine

## 2020-11-22 ENCOUNTER — Other Ambulatory Visit: Payer: Self-pay

## 2020-11-22 ENCOUNTER — Ambulatory Visit: Payer: 59 | Admitting: Adult Health

## 2020-11-22 VITALS — BP 130/90 | HR 69 | Temp 97.5°F | Resp 16 | Ht 64.0 in | Wt 186.6 lb

## 2020-11-22 DIAGNOSIS — E559 Vitamin D deficiency, unspecified: Secondary | ICD-10-CM

## 2020-11-22 DIAGNOSIS — Z79899 Other long term (current) drug therapy: Secondary | ICD-10-CM

## 2020-11-22 DIAGNOSIS — I10 Essential (primary) hypertension: Secondary | ICD-10-CM

## 2020-11-22 DIAGNOSIS — E782 Mixed hyperlipidemia: Secondary | ICD-10-CM | POA: Diagnosis not present

## 2020-11-22 DIAGNOSIS — I2510 Atherosclerotic heart disease of native coronary artery without angina pectoris: Secondary | ICD-10-CM

## 2020-11-22 DIAGNOSIS — R7309 Other abnormal glucose: Secondary | ICD-10-CM

## 2020-11-22 DIAGNOSIS — E349 Endocrine disorder, unspecified: Secondary | ICD-10-CM

## 2020-11-22 DIAGNOSIS — K219 Gastro-esophageal reflux disease without esophagitis: Secondary | ICD-10-CM

## 2020-11-22 NOTE — Patient Instructions (Signed)

## 2020-11-23 ENCOUNTER — Encounter: Payer: Self-pay | Admitting: Gastroenterology

## 2020-11-23 LAB — CBC WITH DIFFERENTIAL/PLATELET
Absolute Monocytes: 615 {cells}/uL (ref 200–950)
Basophils Absolute: 11 {cells}/uL (ref 0–200)
Basophils Relative: 0.2 %
Eosinophils Absolute: 127 {cells}/uL (ref 15–500)
Eosinophils Relative: 2.4 %
HCT: 43 % (ref 38.5–50.0)
Hemoglobin: 14.5 g/dL (ref 13.2–17.1)
Lymphs Abs: 1717 {cells}/uL (ref 850–3900)
MCH: 33 pg (ref 27.0–33.0)
MCHC: 33.7 g/dL (ref 32.0–36.0)
MCV: 97.9 fL (ref 80.0–100.0)
MPV: 10.7 fL (ref 7.5–12.5)
Monocytes Relative: 11.6 %
Neutro Abs: 2830 {cells}/uL (ref 1500–7800)
Neutrophils Relative %: 53.4 %
Platelets: 180 10*3/uL (ref 140–400)
RBC: 4.39 Million/uL (ref 4.20–5.80)
RDW: 12.6 % (ref 11.0–15.0)
Total Lymphocyte: 32.4 %
WBC: 5.3 10*3/uL (ref 3.8–10.8)

## 2020-11-23 LAB — HEMOGLOBIN A1C
Hgb A1c MFr Bld: 5.1 %{Hb}
Mean Plasma Glucose: 100 mg/dL
eAG (mmol/L): 5.5 mmol/L

## 2020-11-23 LAB — COMPLETE METABOLIC PANEL WITH GFR
AG Ratio: 1.8 (calc) (ref 1.0–2.5)
ALT: 43 U/L (ref 9–46)
AST: 56 U/L — ABNORMAL HIGH (ref 10–35)
Albumin: 4.4 g/dL (ref 3.6–5.1)
Alkaline phosphatase (APISO): 51 U/L (ref 35–144)
BUN: 22 mg/dL (ref 7–25)
CO2: 23 mmol/L (ref 20–32)
Calcium: 9.9 mg/dL (ref 8.6–10.3)
Chloride: 105 mmol/L (ref 98–110)
Creat: 1.2 mg/dL (ref 0.70–1.35)
Globulin: 2.4 g/dL (calc) (ref 1.9–3.7)
Glucose, Bld: 93 mg/dL (ref 65–99)
Potassium: 4.1 mmol/L (ref 3.5–5.3)
Sodium: 138 mmol/L (ref 135–146)
Total Bilirubin: 0.8 mg/dL (ref 0.2–1.2)
Total Protein: 6.8 g/dL (ref 6.1–8.1)
eGFR: 67 mL/min/{1.73_m2} (ref >=60)

## 2020-11-23 LAB — LIPID PANEL
Cholesterol: 125 mg/dL
HDL: 48 mg/dL
LDL Cholesterol (Calc): 58 mg/dL
Non-HDL Cholesterol (Calc): 77 mg/dL
Total CHOL/HDL Ratio: 2.6 (calc)
Triglycerides: 109 mg/dL

## 2020-11-23 LAB — VITAMIN D 25 HYDROXY (VIT D DEFICIENCY, FRACTURES): Vit D, 25-Hydroxy: 121 ng/mL — ABNORMAL HIGH (ref 30–100)

## 2020-11-23 LAB — MAGNESIUM: Magnesium: 1.6 mg/dL (ref 1.5–2.5)

## 2020-11-23 LAB — TSH: TSH: 0.91 m[IU]/L (ref 0.40–4.50)

## 2020-11-23 LAB — TESTOSTERONE: Testosterone: 494 ng/dL (ref 250–827)

## 2020-11-23 LAB — INSULIN, RANDOM: Insulin: 3.6 u[IU]/mL

## 2020-11-23 NOTE — Progress Notes (Signed)
============================================================ -   Test results slightly outside the reference range are not unusual. If there is anything important, I will review this with you,  otherwise it is considered normal test values.  If you have further questions,  please do not hesitate to contact me at the office or via My Chart.  ============================================================ ============================================================  -   Magnesium  -   1.6   -  very  low- goal is betw 2.0 - 2.5,   - So..............Marland Kitchen  Recommend that you take  Magnesium 500 mg tablet daily   - also important to eat lots of  leafy green vegetables   - spinach - Kale - collards - greens - okra - asparagus  - broccoli - quinoa - squash - almonds   - black, red, white beans  -  peas - green beans ============================================================ ============================================================  - Total Chol = 125   & LDL Chol = 58  -      Both       Excellent   - Very low risk for Heart Attack  / Stroke ============================================================ ============================================================  -  A1c - Normal - No Diabetes  - Great ! ============================================================ ============================================================  - Vitamin D = 121 is too High  !  -- Vitamin D goal is between 70-100.   - So Please   STOP     your Vitamin D !   xxxxxxxxxxxxxxxxxxxxxxxxxxxxxxxxxxxxxxxxxxxx[id number] [id number]   - It's not clear from your Med list how much Vitamin D that you are taking.   - Please notify the office how much Vit D that you are taking,                                                          So I can advise how to adjust your dose      xxxxxxxxxxxxxxxxxxxxxxxxxxxxxxxxxxxxxxxx[id number] [id number]   - It is very important as a natural anti-inflammatory and helping the  immune system protect against viral infections, like the Covid-19   helping hair, skin, and nails, as well as reducing stroke and  heart attack risk.   - It helps your bones and helps with mood.  - It also decreases numerous cancer risks so please  take it as directed.   - Low Vit D is associated with a 200-300% higher risk for  CANCER   and 200-300% higher risk for HEART   ATTACK  &  STROKE.    - It is also associated with higher death rate at younger ages,   autoimmune diseases like Rheumatoid arthritis, Lupus,  Multiple Sclerosis.     - Also many other serious conditions, like depression, Alzheimer's  Dementia, infertility, muscle aches, fatigue, fibromyalgia   - just to name a few. ============================================================ ============================================================  - Testosterone - OK - In Normal Range  ============================================================ ============================================================  - All Else - CBC - Kidneys - Electrolytes - Liver - Magnesium & Thyroid    - all  Normal / OK ============================================================

## 2020-11-26 ENCOUNTER — Encounter: Payer: Self-pay | Admitting: Internal Medicine

## 2020-12-02 ENCOUNTER — Ambulatory Visit: Payer: 59 | Attending: Internal Medicine

## 2020-12-02 ENCOUNTER — Other Ambulatory Visit (HOSPITAL_BASED_OUTPATIENT_CLINIC_OR_DEPARTMENT_OTHER): Payer: Self-pay

## 2020-12-02 DIAGNOSIS — Z23 Encounter for immunization: Secondary | ICD-10-CM

## 2020-12-02 MED ORDER — PFIZER COVID-19 VAC BIVALENT 30 MCG/0.3ML IM SUSP
INTRAMUSCULAR | 0 refills | Status: DC
  Filled 2020-12-02: qty 0.3, 1d supply, fill #0

## 2020-12-02 NOTE — Progress Notes (Signed)
   Covid-19 Vaccination Clinic  Name:  Zymere Patlan    MRN: [id number] DOB: 1955/09/05  12/02/2020  Mr. Folger was observed post Covid-19 immunization for 15 minutes without incident. He was provided with Vaccine Information Sheet and instruction to access the V-Safe system.   Mr. Ende was instructed to call 911 with any severe reactions post vaccine: Difficulty breathing  Swelling of face and throat  A fast heartbeat  A bad rash all over body  Dizziness and weakness

## 2020-12-18 ENCOUNTER — Other Ambulatory Visit: Payer: Self-pay | Admitting: Internal Medicine

## 2020-12-18 DIAGNOSIS — F419 Anxiety disorder, unspecified: Secondary | ICD-10-CM

## 2020-12-18 MED ORDER — ALPRAZOLAM 1 MG PO TABS: ORAL_TABLET | ORAL | 0 refills | Status: DC

## 2021-01-06 ENCOUNTER — Other Ambulatory Visit: Payer: Self-pay | Admitting: Adult Health

## 2021-01-31 ENCOUNTER — Encounter: Payer: Self-pay | Admitting: Internal Medicine

## 2021-01-31 ENCOUNTER — Other Ambulatory Visit: Payer: Self-pay | Admitting: Internal Medicine

## 2021-01-31 DIAGNOSIS — F5101 Primary insomnia: Secondary | ICD-10-CM

## 2021-01-31 DIAGNOSIS — E349 Endocrine disorder, unspecified: Secondary | ICD-10-CM

## 2021-01-31 MED ORDER — TESTOSTERONE CYPIONATE 200 MG/ML IM SOLN: INTRAMUSCULAR | 2 refills | Status: DC

## 2021-01-31 MED ORDER — TRAZODONE HCL 150 MG PO TABS: ORAL_TABLET | ORAL | 0 refills | Status: DC

## 2021-03-14 ENCOUNTER — Encounter: Payer: Self-pay | Admitting: Internal Medicine

## 2021-03-15 ENCOUNTER — Other Ambulatory Visit: Payer: Self-pay | Admitting: Internal Medicine

## 2021-03-15 DIAGNOSIS — F419 Anxiety disorder, unspecified: Secondary | ICD-10-CM

## 2021-03-15 MED ORDER — ALPRAZOLAM 0.5 MG PO TABS: ORAL_TABLET | ORAL | 0 refills | Status: DC

## 2021-04-06 ENCOUNTER — Other Ambulatory Visit: Payer: Self-pay | Admitting: Adult Health

## 2021-04-06 DIAGNOSIS — E782 Mixed hyperlipidemia: Secondary | ICD-10-CM

## 2021-04-25 ENCOUNTER — Encounter: Payer: Self-pay | Admitting: Internal Medicine

## 2021-04-25 ENCOUNTER — Other Ambulatory Visit: Payer: Self-pay | Admitting: Internal Medicine

## 2021-04-25 DIAGNOSIS — F419 Anxiety disorder, unspecified: Secondary | ICD-10-CM

## 2021-04-25 MED ORDER — ALPRAZOLAM 0.5 MG PO TABS: ORAL_TABLET | ORAL | 0 refills | Status: DC

## 2021-05-17 ENCOUNTER — Other Ambulatory Visit: Payer: Self-pay | Admitting: Adult Health

## 2021-05-21 NOTE — Patient Instructions (Signed)

## 2021-05-21 NOTE — Progress Notes (Signed)
? ?Annual  Screening/Preventative Visit  ?& Comprehensive Evaluation & Examination ? ?Future Appointments  ?Date Time Provider Department  ?05/22/2021  3:00 PM Unk Pinto, MD [facility]  ?05/29/2022  3:00 PM Unk Pinto, MD [facility]  ? ?    ?     This very nice 66 y.o. MWM presents for a Screening /Preventative Visit & comprehensive evaluation and management of multiple medical co-morbidities.  Patient has been followed for HTN, HLD, Prediabetes, Testosterone Deficiency  and Vitamin D Deficiency. Patient has GERD controlled on his Pantoprazole.  ? ? ?    HTN predates since 1989. Patient's BP has been controlled at home.   In 2009, patient hasd a Negative / Normal Heart Cath.  In 2012, he presented with ACS  & had emergency PCA/ DES . Patient has done well since w/o suspect CP. He does have CKD3a (GFR 57) attributed to HTVD . Today's BP is at goal - with high normal diastolic - 481/85. Patient denies any cardiac symptoms as chest pain, palpitations, shortness of breath, dizziness or ankle swelling. ? ? ?    Patient's hyperlipidemia is controlled with diet and Atorvastatin. Patient denies myalgias or other medication SE's. Last lipids were at goal : ? ?Lab Results  ?Component Value Date  ? CHOL 125 11/22/2020  ? HDL 48 11/22/2020  ? [place] 58 11/22/2020  ? TRIG 109 11/22/2020  ? CHOLHDL 2.6 11/22/2020  ? ? ? ?    Patient has hx/o prediabetes (A1c 5.8% /2008) and patient denies reactive hypoglycemic symptoms, visual blurring, diabetic polys or paresthesias. Last A1c was normal & at goal : ?  ?Lab Results  ?Component Value Date  ? HGBA1C 5.1 11/22/2020  ?  ? ?                                      Patient Low Testosterone  Deficiency ("308" with Nl range 350-890 in 2009 ) and has been on parenteral replacement  administering his own injections. Patient reports improved sense of well-being on treatment.  ? ? ?    Finally, patient has history of Vitamin D Deficiency  ("19" /2009) and last vitamin D was elevated  & dose was decreased : ?  ?Lab Results  ?Component Value Date  ? VD25OH 121 (H) 11/22/2020  ? ? ? ?Current Outpatient Medications on File Prior to Visit  ?Medication Sig  ? acetaminophen 325 MG  Take 2 tablets' \\every'$  4 hours as needed  ? ALPRAZolam 0.5 MG  Take  1/2-1 tablet  ONLY  if needed f  ? VITAMIN C  Take 1 tablet daily   ? ASPIRIN 81  Take  daily.   ? atenolol (100 MG  Take  1 tablet  Daily  for BP  ? atorvastatin 40 MG  TAKE 1 TABLET DAILY  ? buPROPion - XL 300 MG 2 Take 1 tablet every Morning   ? clopidogrel (PLAVIX) 75 MG  Take 1 tablet Daily to prevent Blood Clots  ? cyclobenzaprine 10 MG  Take 1/2 to 1 tablet 3 x /day as needed  Muscle Spasm  ? diclofenac 1 % GEL Apply 2 to 4 grams 2 to 4 x / day  ? diltiazem  CD 240 MG 24 hr  Take 1 tablet  Daily   ? doxazosin  8 MG  Take  1 tablet   at Bedtime    ? furosemide 80 MG  TAKE 1 TABLET  EVERY DAY   ? Magnesium 400 MG  Take 2 capsules by mouth daily.  ? meloxicam  15 MG  TAKE 1/2 TO 1 TABLET EVERY DAY   ? Mega Men Multivitamin Take 2 tablets daily.   ? NITROSTAT 0.4 MG SL  as needed for Chest Pain  ? olmesartan  40 MG  Take 1/2  to 1 tablet Daily for BP  ? pantoprazole  40 MG TAKE 1 TABLET DAILY   ? tadalafil (CIALIS) 20 MG  Take 1/2 to 1 tablet every 2 to 3 days as needed   ? tamsulosin  0.4 MG CAPS  TAKE 1 CAPSULE  AT BEDTIME   ? testosterone cypio 200 MG/ML inj INJECT 1 MLS IM EVERY WEEK  ? traZODone  150 MG tablet Take  1/2 to 1 tablet  1 hour  before Bedtime as needed for Sleep  ? ? ? ?Allergies  ?Allergen Reactions  ? Hctz [Hydrochlorothiazide]   ? Tape Rash  ?  Clear tape  ? ? ? ?Past Medical History:  ?Diagnosis Date  ? Anxiety   ? Atrial flutter, paroxysmal ([place]) 01/04/2011  ? CAD (coronary artery disease), native coronary artery 01/04/2011  ? Diverticulitis   ? Diverticulosis   ? GERD (gastroesophageal reflux disease)   ? History of meniscectomy of right knee 01/02/2011  ? Hypertension   ? NSVT (nonsustained ventricular tachycardia) 01/02/2011  ?  Primary osteoarthritis of right knee 04/03/2017  ? Stented coronary artery 01/04/2011  ? LAD DES Resolute.  ? ? ? ?Health Maintenance  ?Topic Date Due  ? Zoster Vaccines- Shingrix (1 of 2) Never done  ? Pneumonia Vaccine 15+ Years old (1 - PCV) Never done  ? INFLUENZA VACCINE  09/19/2021  ? TETANUS/TDAP  02/28/2025  ? COVID-19 Vaccine  Completed  ? Hepatitis C Screening  Completed  ? HPV VACCINES  Aged Out  ? ? ? ?Immunization History  ?Administered Date(s) Administered  ? Influenza  12/16/2013, 12/02/2014  ? Influenza  12/09/2012  ? Influenza 12/01/2015  ? Influenza 11/19/2016, 11/30/2017  ? PFIZER Covid-19 Vaccine 07/26/2020  ? PFIZER SARS-COV-2 Vacc 05/21/2019, 06/12/2019  ? PPD Test 02/10/2016, 03/21/2017, 04/16/2018  ? Pfizer [employer]  12/02/2020  ? Td 02/19/2005  ? Tdap 03/01/2015  ? ? ?Last Colon - 11/15/2020  - Dr Dustin Flock Recc 10 year f/u due Oct 2032. ? ? ?Past Surgical History:  ?Procedure Laterality Date  ? APPENDECTOMY    ? CARPAL TUNNEL RELEASE Right 2021  ? Dr. Grandville Silos  ? COLONOSCOPY    ? CORONARY ANGIOPLASTY WITH STENT PLACEMENT  2012  ? ELBOW SURGERY Right 2021  ? Cyst excision/nerve decompression, Dr. Grandville Silos  ? HAND SURGERY    ? right  ? KNEE ARTHROSCOPY    ? x 8 left  ? KNEE ARTHROSCOPY Right 04/21/2012  ?  ARTHROSCOPY KNEE;  Lorn Junes, MD Right;  medial and lateral meniscectomies  ? LEFT HEART CATH WITH COR  ANGIOGRAM N/A 01/03/2011  ? LEFT HEART CATHETERIZATION WITH CORONARY ANGIOGRAM;  Lorretta Harp, MD  ? PERCUTANEOUS CORONARY STENT INTERVENTION (PCI-S) N/A 01/03/2011  ? PERCUTANEOUS CORONARY STENT INTERVENTION (PCI-S);  Lorretta Harp, MD  ? ROTATOR CUFF REPAIR    ? bilateral  ? TONSILLECTOMY    ? TOTAL KNEE ARTHROPLASTY Right 04/15/2017  ? TOTAL KNEE ARTHROPLASTY Right 04/15/2017  ? TOTAL KNEE ARTHROPLASTY;   Elsie Saas, MD;   ? TRICEPS TENDON REPAIR Bilateral   ? ? ? ?  Family History  ?Problem Relation Age of Onset  ? Diabetes Mother   ? Heart disease  Mother   ? Heart attack Mother   ? Heart disease Father   ? Coronary artery disease Father   ? Obesity Brother   ? Diabetes Brother   ? HIV/AIDS Brother   ? Hypertension Brother   ? Colon cancer Neg Hx   ? Esophageal cancer Neg Hx   ? Stomach cancer Neg Hx   ? Rectal cancer Neg Hx   ? ? ? ?Social History  ? ?Tobacco Use  ? Smoking status: Never  ? Smokeless tobacco: Never  ?Vaping Use  ? Vaping Use: Never used  ?Substance Use Topics  ? Alcohol use: Yes  ?  Alcohol/week: 10.0 standard drinks  ?  Types: 10 Cans of beer per week  ?  Comment: 1-2 beers a night  ? Drug use: No  ? ? ? ? ROS ?Constitutional: Denies fever, chills, weight loss/gain, headaches, insomnia,  night sweats or change in appetite. Does c/o fatigue. ?Eyes: Denies redness, blurred vision, diplopia, discharge, itchy or watery eyes.  ?ENT: Denies discharge, congestion, post nasal drip, epistaxis, sore throat, earache, hearing loss, dental pain, Tinnitus, Vertigo, Sinus pain or snoring.  ?Cardio: Denies chest pain, palpitations, irregular heartbeat, syncope, dyspnea, diaphoresis, orthopnea, PND, claudication or edema ?Respiratory: denies cough, dyspnea, DOE, pleurisy, hoarseness, laryngitis or wheezing.  ?Gastrointestinal: Denies dysphagia, heartburn, reflux, water brash, pain, cramps, nausea, vomiting, bloating, diarrhea, constipation, hematemesis, melena, hematochezia, jaundice or hemorrhoids ?Genitourinary: Denies dysuria, frequency, discharge, hematuria or flank pain. Has urgency, nocturia x 1-2 & occasional hesitancy. ?Musculoskeletal: Denies arthralgia, myalgia, stiffness, Jt. Swelling, pain, limp or strain/sprain. Denies Falls. ?Skin: Denies puritis, rash, hives, warts, acne, eczema or change in skin lesion ?Neuro: No weakness, tremor, incoordination, spasms, paresthesia or pain ?Psychiatric: Denies confusion, memory loss or sensory loss. Denies Depression. ?Endocrine: Denies change in weight, skin, hair change, nocturia, and paresthesia, diabetic  polys, visual blurring or hyper / hypo glycemic episodes.  ?Heme/Lymph: No excessive bleeding, bruising or enlarged lymph nodes. ? ? ?Physical Exam ? ?BP 136/88   Pulse 68   Temp (!) 97.3 ?F (36.3 ?C)

## 2021-05-22 ENCOUNTER — Ambulatory Visit: Payer: 59 | Admitting: Internal Medicine

## 2021-05-22 ENCOUNTER — Other Ambulatory Visit: Payer: Self-pay | Admitting: Internal Medicine

## 2021-05-22 ENCOUNTER — Encounter: Payer: Self-pay | Admitting: Internal Medicine

## 2021-05-22 VITALS — BP 136/88 | HR 68 | Temp 97.3°F | Ht 66.5 in | Wt 186.2 lb

## 2021-05-22 DIAGNOSIS — I2510 Atherosclerotic heart disease of native coronary artery without angina pectoris: Secondary | ICD-10-CM | POA: Diagnosis not present

## 2021-05-22 DIAGNOSIS — Z136 Encounter for screening for cardiovascular disorders: Secondary | ICD-10-CM | POA: Diagnosis not present

## 2021-05-22 DIAGNOSIS — E349 Endocrine disorder, unspecified: Secondary | ICD-10-CM

## 2021-05-22 DIAGNOSIS — Z0000 Encounter for general adult medical examination without abnormal findings: Secondary | ICD-10-CM

## 2021-05-22 DIAGNOSIS — K219 Gastro-esophageal reflux disease without esophagitis: Secondary | ICD-10-CM

## 2021-05-22 DIAGNOSIS — I10 Essential (primary) hypertension: Secondary | ICD-10-CM

## 2021-05-22 DIAGNOSIS — E559 Vitamin D deficiency, unspecified: Secondary | ICD-10-CM

## 2021-05-22 DIAGNOSIS — R7309 Other abnormal glucose: Secondary | ICD-10-CM

## 2021-05-22 DIAGNOSIS — Z0001 Encounter for general adult medical examination with abnormal findings: Secondary | ICD-10-CM

## 2021-05-22 DIAGNOSIS — Z8249 Family history of ischemic heart disease and other diseases of the circulatory system: Secondary | ICD-10-CM

## 2021-05-22 DIAGNOSIS — Z955 Presence of coronary angioplasty implant and graft: Secondary | ICD-10-CM

## 2021-05-22 DIAGNOSIS — Z79899 Other long term (current) drug therapy: Secondary | ICD-10-CM

## 2021-05-22 DIAGNOSIS — N138 Other obstructive and reflux uropathy: Secondary | ICD-10-CM

## 2021-05-22 DIAGNOSIS — N1831 Chronic kidney disease, stage 3a: Secondary | ICD-10-CM

## 2021-05-22 DIAGNOSIS — E782 Mixed hyperlipidemia: Secondary | ICD-10-CM

## 2021-05-22 DIAGNOSIS — Z125 Encounter for screening for malignant neoplasm of prostate: Secondary | ICD-10-CM

## 2021-05-22 MED ORDER — NITROGLYCERIN 0.4 MG SL SUBL: SUBLINGUAL_TABLET | SUBLINGUAL | 1 refills | Status: AC

## 2021-05-23 LAB — VITAMIN D 25 HYDROXY (VIT D DEFICIENCY, FRACTURES): Vit D, 25-Hydroxy: 108 ng/mL — ABNORMAL HIGH (ref 30–100)

## 2021-05-23 LAB — URINALYSIS, ROUTINE W REFLEX MICROSCOPIC
Bilirubin Urine: NEGATIVE
Glucose, UA: NEGATIVE
Hgb urine dipstick: NEGATIVE
Ketones, ur: NEGATIVE
Leukocytes,Ua: NEGATIVE
Nitrite: NEGATIVE
Protein, ur: NEGATIVE
Specific Gravity, Urine: 1.013 (ref 1.001–1.035)
pH: 5.5 (ref 5.0–8.0)

## 2021-05-23 LAB — PTH, INTACT AND CALCIUM
Calcium: 9.6 mg/dL (ref 8.6–10.3)
PTH: 18 pg/mL (ref 16–77)

## 2021-05-23 LAB — COMPLETE METABOLIC PANEL WITH GFR
AG Ratio: 1.6 (calc) (ref 1.0–2.5)
ALT: 43 U/L (ref 9–46)
AST: 53 U/L — ABNORMAL HIGH (ref 10–35)
Albumin: 4.4 g/dL (ref 3.6–5.1)
Alkaline phosphatase (APISO): 55 U/L (ref 35–144)
BUN/Creatinine Ratio: 18 (calc) (ref 6–22)
BUN: 26 mg/dL — ABNORMAL HIGH (ref 7–25)
CO2: 21 mmol/L (ref 20–32)
Calcium: 9.6 mg/dL (ref 8.6–10.3)
Chloride: 104 mmol/L (ref 98–110)
Creat: 1.43 mg/dL — ABNORMAL HIGH (ref 0.70–1.35)
Globulin: 2.7 g/dL (calc) (ref 1.9–3.7)
Glucose, Bld: 81 mg/dL (ref 65–99)
Potassium: 3.9 mmol/L (ref 3.5–5.3)
Sodium: 139 mmol/L (ref 135–146)
Total Bilirubin: 0.7 mg/dL (ref 0.2–1.2)
Total Protein: 7.1 g/dL (ref 6.1–8.1)
eGFR: 54 mL/min/{1.73_m2} — ABNORMAL LOW (ref >=60)

## 2021-05-23 LAB — TESTOSTERONE: Testosterone: 726 ng/dL (ref 250–827)

## 2021-05-23 LAB — MICROALBUMIN / CREATININE URINE RATIO
Creatinine, Urine: 95 mg/dL (ref 20–320)
Microalb Creat Ratio: 2 mcg/mg creat (ref <30)
Microalb, Ur: 0.2 mg/dL

## 2021-05-23 LAB — CBC WITH DIFFERENTIAL/PLATELET
Absolute Monocytes: 897 cells/uL (ref 200–950)
Basophils Absolute: 12 cells/uL (ref 0–200)
Basophils Relative: 0.2 %
Eosinophils Absolute: 140 cells/uL (ref 15–500)
Eosinophils Relative: 2.3 %
HCT: 43.1 % (ref 38.5–50.0)
Hemoglobin: 14.5 g/dL (ref 13.2–17.1)
Lymphs Abs: 1568 cells/uL (ref 850–3900)
MCH: 33.6 pg — ABNORMAL HIGH (ref 27.0–33.0)
MCHC: 33.6 g/dL (ref 32.0–36.0)
MCV: 99.8 fL (ref 80.0–100.0)
MPV: 10.4 fL (ref 7.5–12.5)
Monocytes Relative: 14.7 %
Neutro Abs: 3483 cells/uL (ref 1500–7800)
Neutrophils Relative %: 57.1 %
Platelets: 187 10*3/uL (ref 140–400)
RBC: 4.32 10*6/uL (ref 4.20–5.80)
RDW: 12.7 % (ref 11.0–15.0)
Total Lymphocyte: 25.7 %
WBC: 6.1 10*3/uL (ref 3.8–10.8)

## 2021-05-23 LAB — LIPID PANEL
Cholesterol: 140 mg/dL (ref <200)
HDL: 47 mg/dL (ref >=40)
LDL Cholesterol (Calc): 68 mg/dL (calc)
Non-HDL Cholesterol (Calc): 93 mg/dL (calc) (ref <130)
Total CHOL/HDL Ratio: 3 (calc) (ref <5.0)
Triglycerides: 170 mg/dL — ABNORMAL HIGH (ref <150)

## 2021-05-23 LAB — HEMOGLOBIN A1C
Hgb A1c MFr Bld: 5.3 % of total Hgb (ref <5.7)
Mean Plasma Glucose: 105 mg/dL
eAG (mmol/L): 5.8 mmol/L

## 2021-05-23 LAB — TSH: TSH: 1.09 mIU/L (ref 0.40–4.50)

## 2021-05-23 LAB — INSULIN, RANDOM: Insulin: 7 u[IU]/mL

## 2021-05-23 LAB — PSA: PSA: 1.37 ng/mL (ref <=4.00)

## 2021-05-23 LAB — MAGNESIUM: Magnesium: 1.5 mg/dL (ref 1.5–2.5)

## 2021-05-23 NOTE — Progress Notes (Signed)
<><><><><><><><><><><><><><><><><><><><><><><><><><><><><><><><><> ?<><><><><><><><><><><><><><><><><><><><><><><><><><><><><><><><><> ?-   Test results slightly outside the reference range are not unusual. ?If there is anything important, I will review this with you,  ?otherwise it is considered normal test values.  ?If you have further questions,  ?please do not hesitate to contact me at the office or via My Chart.  ?<><><><><><><><><><><><><><><><><><><><><><><><><><><><><><><><><> ?<><><><><><><><><><><><><><><><><><><><><><><><><><><><><><><><><> ? ?-    Vitamin D = 108  - Borderline elevated   ?                                             - please let me know your Daily dose,  ?                                                                             so I can adjust your dosing schedule  ? ?<><><><><><><><><><><><><><><><><><><><><><><><><><><><><><><><><> ?<><><><><><><><><><><><><><><><><><><><><><><><><><><><><><><><><> ? ?- Kidney functions still look a little dehydrated  ? ? Very important to drink adequate amounts of fluids to prevent permanent damage   ? ?- Recommend drink at least 6 bottles (16 ounces) of fluids /water /day = 96 Oz ~100 oz ? ?- 100 oz = 3,000 cc or 3 liters / day  - >> That's 1 &1/2 bottles of a 2 liter soda bottle /day !  ?<><><><><><><><><><><><><><><><><><><><><><><><><><><><><><><><><> ?<><><><><><><><><><><><><><><><><><><><><><><><><><><><><><><><><> ? ? ?-  Total  Chol =  140       - Ecellent  ?           (  Ideal  or  Goal is less than 180  !  )  ? ?- and  ? ?-  Bad / Dangerous LDL  Chol =   68 -  also Excellent ?            (  Ideal  or  Goal is less than 70  !  )  ? ?- Keep up the great work !  ?<><><><><><><><><><><><><><><><><><><><><><><><><><><><><><><><><> ?<><><><><><><><><><><><><><><><><><><><><><><><><><><><><><><><><> ? ?-  U/A - Negative ?  <><><><><><><><><><><><><><><><><><><><><><><><><><><><><><><><><> ?<><><><><><><><><><><><><><><><><><><><><><><><><><><><><><><><><> ? ?-    PSA  - Low  -  Great ! ?<><><><><><><><><><><><><><><><><><><><><><><><><><><><><><><><><> ?<><><><><><><><><><><><><><><><><><><><><><><><><><><><><><><><><> ? ?-    Testosterone - Normal - Great ?<><><><><><><><><><><><><><><><><><><><><><><><><><><><><><><><><> ?<><><><><><><><><><><><><><><><><><><><><><><><><><><><><><><><><> ? ?-    PTH hormone that regulates calcium balance & is Normal  ? ?<><><><><><><><><><><><><><><><><><><><><><><><><><><><><><><><><> ?<><><><><><><><><><><><><><><><><><><><><><><><><><><><><><><><><> ? ? ? ? ? ? ? ? ? ? ? ? ? ? ? ? ? ?

## 2021-06-03 ENCOUNTER — Encounter: Payer: Self-pay | Admitting: Internal Medicine

## 2021-06-10 ENCOUNTER — Other Ambulatory Visit: Payer: Self-pay | Admitting: Nurse Practitioner

## 2021-06-10 ENCOUNTER — Encounter: Payer: Self-pay | Admitting: Internal Medicine

## 2021-06-10 DIAGNOSIS — N529 Male erectile dysfunction, unspecified: Secondary | ICD-10-CM

## 2021-06-10 MED ORDER — TADALAFIL 20 MG PO TABS: ORAL_TABLET | ORAL | 1 refills | Status: DC

## 2021-07-05 ENCOUNTER — Other Ambulatory Visit: Payer: Self-pay | Admitting: Internal Medicine

## 2021-07-05 ENCOUNTER — Encounter: Payer: Self-pay | Admitting: Internal Medicine

## 2021-07-05 DIAGNOSIS — K219 Gastro-esophageal reflux disease without esophagitis: Secondary | ICD-10-CM

## 2021-07-05 DIAGNOSIS — I10 Essential (primary) hypertension: Secondary | ICD-10-CM

## 2021-07-12 ENCOUNTER — Other Ambulatory Visit: Payer: Self-pay | Admitting: Nurse Practitioner

## 2021-07-12 ENCOUNTER — Encounter: Payer: Self-pay | Admitting: Internal Medicine

## 2021-07-12 ENCOUNTER — Other Ambulatory Visit: Payer: Self-pay | Admitting: Internal Medicine

## 2021-07-12 DIAGNOSIS — F419 Anxiety disorder, unspecified: Secondary | ICD-10-CM

## 2021-07-16 ENCOUNTER — Other Ambulatory Visit: Payer: Self-pay | Admitting: Internal Medicine

## 2021-07-16 DIAGNOSIS — F419 Anxiety disorder, unspecified: Secondary | ICD-10-CM

## 2021-07-18 ENCOUNTER — Encounter: Payer: Self-pay | Admitting: Internal Medicine

## 2021-07-18 MED ORDER — ALPRAZOLAM 0.5 MG PO TABS: ORAL_TABLET | ORAL | 0 refills | Status: DC

## 2021-07-20 ENCOUNTER — Encounter: Payer: Self-pay | Admitting: Internal Medicine

## 2021-08-05 ENCOUNTER — Encounter: Payer: Self-pay | Admitting: Internal Medicine

## 2021-08-30 ENCOUNTER — Ambulatory Visit: Payer: 59 | Admitting: Nurse Practitioner

## 2021-08-30 ENCOUNTER — Encounter: Payer: Self-pay | Admitting: Nurse Practitioner

## 2021-08-30 VITALS — BP 130/87 | HR 57 | Temp 97.3°F | Resp 16 | Wt 184.4 lb

## 2021-08-30 DIAGNOSIS — I4892 Unspecified atrial flutter: Secondary | ICD-10-CM

## 2021-08-30 DIAGNOSIS — I4729 Other ventricular tachycardia: Secondary | ICD-10-CM

## 2021-08-30 DIAGNOSIS — E349 Endocrine disorder, unspecified: Secondary | ICD-10-CM

## 2021-08-30 DIAGNOSIS — I2510 Atherosclerotic heart disease of native coronary artery without angina pectoris: Secondary | ICD-10-CM

## 2021-08-30 DIAGNOSIS — M48061 Spinal stenosis, lumbar region without neurogenic claudication: Secondary | ICD-10-CM

## 2021-08-30 DIAGNOSIS — I130 Hypertensive heart and chronic kidney disease with heart failure and stage 1 through stage 4 chronic kidney disease, or unspecified chronic kidney disease: Secondary | ICD-10-CM

## 2021-08-30 DIAGNOSIS — K219 Gastro-esophageal reflux disease without esophagitis: Secondary | ICD-10-CM

## 2021-08-30 DIAGNOSIS — E559 Vitamin D deficiency, unspecified: Secondary | ICD-10-CM

## 2021-08-30 DIAGNOSIS — K5790 Diverticulosis of intestine, part unspecified, without perforation or abscess without bleeding: Secondary | ICD-10-CM

## 2021-08-30 DIAGNOSIS — Z0000 Encounter for general adult medical examination without abnormal findings: Secondary | ICD-10-CM

## 2021-08-30 DIAGNOSIS — R7309 Other abnormal glucose: Secondary | ICD-10-CM

## 2021-08-30 DIAGNOSIS — Z79899 Other long term (current) drug therapy: Secondary | ICD-10-CM

## 2021-08-30 NOTE — Progress Notes (Signed)
WELCOME TO MEDICARE ANNUAL WELLNESS VISIT AND FOLLOW UP Assessment:   1. Welcome to Medicare preventive visit Due Annually  2. NSVT (nonsustained ventricular tachycardia) (HCC) Controlled. No longer follows with Cardiology, medications managed here.   Continue medications; Clopidogrel, Diltiazem, Doxazosin, Nitroglycerin, Olmesartan Continue to monitor   3. Atrial flutter, paroxysmal (HCC) Controlled. No longer follows with Cardiology, medications managed here.   Continue medications; Clopidogrel, Diltiazem, Doxazosin, Nitroglycerin, Olmesartan Continue to monitor  4. Coronary artery disease involving native coronary artery of native heart without angina pectoris Has stent placed Controlled. Continue medications; Clopidogrel, Diltiazem, Doxazosin, Nitroglycerin, Olmesartan Continue to monitor  5. Benign hypertensive heart and CKD, stage 3 (GFR 30-59), w CHF ([place]) Discussed how what you eat and drink can aide in kidney protection. Stay well hydrated. Avoid high salt foods. Avoid NSAIDS. Keep BP and BG well controlled.   Take medications as prescribed. Remain active and exercise as tolerated daily. Maintain weight.  Continue to monitor.   6. Gastroesophageal reflux disease, unspecified whether esophagitis present Continue medications; Pantoprazole No suspected reflux complications ([place]/stricture). Lifestyle modification:  wt loss, avoid meals 2-3h before bedtime. Consider eliminating food triggers:  chocolate, caffeine, EtOH, acid/spicy food.   7. Diverticulosis No recent flare. Discussed high fiber diet. Stay well hydrated. Continue to monitor   8. Other abnormal glucose (hx of prediabetes) Education: Reviewed 'ABCs' of diabetes management  A1C (<7) Blood pressure (<130/80) Cholesterol (LDL <70) Continue Eye Exam yearly  Continue Dental Exam Q6 mo Discussed dietary recommendations Discussed Physical Activity recommendations  9. Vitamin D deficiency At  goal. Continue supplement. Continue to monitor   10. Testosterone deficiency Continue weekly injections  11. Spinal stenosis of lumbar region, unspecified whether neurogenic claudication present Lumbar ablation 09/2020 Follows with Marvin Carter Discussed Gabapentin Enacarbil Extended Release is s/s fail to improve. Continue to monitor   12. Medication management All medications discussed and reviewed in full. All questions and concerns regarding medications addressed.     Over 30 minutes of exam, counseling, chart review, and critical decision making was performed  Future Appointments  Date Time Provider [place]  12/05/2021  9:30 AM Marvin Pinto, MD [facility] None  05/29/2022  3:00 PM Marvin Pinto, MD [facility] None     Plan:   During the course of the visit the patient was educated and counseled about appropriate screening and preventive services including:   Pneumococcal vaccine  Influenza vaccine Prevnar 13 Td vaccine Screening electrocardiogram Colorectal cancer screening Diabetes screening Glaucoma screening Nutrition counseling    Subjective:  Marvin Carter is a 66 y.o. male who presents for a Welcome to [employer] Visit and 3 month follow up. He has Prolapsed external hemorrhoids; Hemorrhoids, internal, with bleeding; NSVT (nonsustained ventricular tachycardia) ([place]); Atrial flutter, paroxysmal ([place]); CAD (coronary artery disease), native coronary artery; Stented coronary artery; Essential hypertension; GERD (gastroesophageal reflux disease); Diverticulosis; Other abnormal glucose (hx of prediabetes); Vitamin D deficiency; Medication management; Testosterone deficiency; Drug-induced erectile dysfunction; Primary osteoarthritis of right knee; Benign hypertensive heart and CKD, stage 3 (GFR 30-59), w CHF ([place]); Obesity (BMI 30.0-34.9); and Spinal stenosis of lumbar region on their problem list.  Overall patient reports feeling  well.  He had a recent lumbar ablation 09/2020.  Has seen some mild improvement as times passes but continues to have difficulty standing or walking for long periods of time.  He has a follow up scheduled to further discuss tmt options.  He continues to take Tylenol, Flexeril, Mobic.    He receives weekly testosterone  injections.    BMI is Body mass index is 29.32 kg/m., he has been working on diet and exercise.  He works out at [employer] 3 times a week. Wt Readings from Last 3 Encounters:  08/30/21 184 lb 6.4 oz (83.6 kg)  05/22/21 186 lb 3.2 oz (84.5 kg)  11/22/20 186 lb 9.6 oz (84.6 kg)     His blood pressure has been controlled at home, today their BP is BP: 130/87 He does workout. He denies chest pain, shortness of breath, dizziness.  He is on cholesterol medication Atorvastatin and denies myalgias. His cholesterol is at goal other than elevated triglycerides. The cholesterol last visit was:   Lab Results  Component Value Date   CHOL 140 05/22/2021   HDL 47 05/22/2021   LDLCALC 68 05/22/2021   TRIG 170 (H) 05/22/2021   CHOLHDL 3.0 05/22/2021   He has been working on diet and exercise for management of prediabetes, and denies polydipsia and polyuria. Last A1C in the office was:  Lab Results  Component Value Date   HGBA1C 5.3 05/22/2021   Last GFR Lab Results  Component Value Date   EGFR 54 (L) 05/22/2021   Patient is on Vitamin D supplement.   Lab Results  Component Value Date   VD25OH 108 (H) 05/22/2021     He has a hx of GERD which is controlled with Pantoprazole and lifestyle modifications.  He follows with Dr. Candis Carter.  He is UTD w/colonoscopy.    Medication Review:  Current Outpatient Medications (Endocrine & Metabolic):    testosterone cypionate (DEPOTESTOSTERONE CYPIONATE) 200 MG/ML injection, INJECT 1 MLS INTRAMUSCULARLY EVERY WEEK  Current Outpatient Medications (Cardiovascular):    atenolol (TENORMIN) 100 MG tablet, TAKE 1 TABLET BY MOUTH DAILY FOR BLOOD  PRESSURE   atorvastatin (LIPITOR) 40 MG tablet, TAKE 1 TABLET BY MOUTH DAILY FOR CHOLESTEROL   diltiazem (CARDIZEM CD) 240 MG 24 hr capsule, TAKE 1 CAPSULE BY MOUTH DAILY FOR BLOOD PRESSURE   doxazosin (CARDURA) 8 MG tablet, TAKE 1 TABLET BY MOUTH AT BEDTIME FOR BLOOD PRESSURE OR PROSTATE   furosemide (LASIX) 80 MG tablet, TAKE 1 TABLET BY MOUTH EVERY DAY FOR BLOOD PRESSURE AND FLUID RETENTION OR ANKLE SWELLING   nitroGLYCERIN (NITROSTAT) 0.4 MG SL tablet, Dissolve 1 tablet under tongue every 3 to 5 minutes as needed for Chest Pain ( Do NOT take with Cialis )   olmesartan (BENICAR) 40 MG tablet, Take 1/2  to 1 tablet Daily for BP   tadalafil (CIALIS) 20 MG tablet, Take 1/2 to 1 tablet every 2 to 3 days as needed for XXXX   Current Outpatient Medications (Analgesics):    acetaminophen (TYLENOL) 325 MG tablet, Take 2 tablets (650 mg total) by mouth every 4 (four) hours as needed for mild pain ((score 1 to 3) or temp > 100.5).   ASPIRIN 81 PO, Take 81 mg by mouth daily.    meloxicam (MOBIC) 15 MG tablet, TAKE 1/2 TO 1 TABLET BY MOUTH EVERY DAY WITH FOOD FOR PAIN AND INFLAMMATION AND LIMIT TO 4-5 DAYS/WEEK TO AVOID KIDNEY DAMAGE  Current Outpatient Medications (Hematological):    clopidogrel (PLAVIX) 75 MG tablet, Take 1 tablet Daily to prevent Blood Clots  Current Outpatient Medications (Other):    ALPRAZolam (XANAX) 0.5 MG tablet, Take  1/2-1 tablet  ONLY  if needed for Sleep &  limit to 5 days /week to avoid Addiction & Dementia (( Should last 12 weeks til August 22 ))   Ascorbic Acid (VITAMIN C  PO), Take 1 tablet by mouth daily as needed (for immune system support).   buPROPion (WELLBUTRIN XL) 300 MG 24 hr tablet, TAKE 1 TABLET BY MOUTH EVERY MORNING FOR MOOD OR FOCUS OR CONCENTRATION   COVID-19 mRNA bivalent vaccine, Pfizer, (PFIZER COVID-19 VAC BIVALENT) injection, Inject into the muscle.   cyclobenzaprine (FLEXERIL) 10 MG tablet, Take 1/2 to 1 tablet 3 x /day as needed for Muscle Spasm    diclofenac sodium (VOLTAREN) 1 % GEL, Apply 2 to 4 grams 2 to 4 x / day   Magnesium 400 MG CAPS, Take 2 capsules by mouth daily.   Multiple Vitamin (MULTIVITAMIN WITH MINERALS) TABS, Take 2 tablets by mouth daily. Mega Men Multivitamin.   pantoprazole (PROTONIX) 40 MG tablet, TAKE 1 TABLET BY MOUTH DAILY FOR STOMACH ACID OR INDIGESTION OR HEART BURN   tamsulosin (FLOMAX) 0.4 MG CAPS capsule, TAKE 1 CAPSULE BY MOUTH AT BEDTIME FOR PROSTATE/URINE FLOW   traZODone (DESYREL) 150 MG tablet, Take  1/2 to 1 tablet  1 hour  before Bedtime as needed for Sleep (Patient not taking: Reported on 05/22/2021)  Allergies: Allergies  Allergen Reactions   Hctz [Hydrochlorothiazide]    Tape Rash    Clear tape    Current Problems (verified) has Prolapsed external hemorrhoids; Hemorrhoids, internal, with bleeding; NSVT (nonsustained ventricular tachycardia) ([place]); Atrial flutter, paroxysmal ([place]); CAD (coronary artery disease), native coronary artery; Stented coronary artery; Essential hypertension; GERD (gastroesophageal reflux disease); Diverticulosis; Other abnormal glucose (hx of prediabetes); Vitamin D deficiency; Medication management; Testosterone deficiency; Drug-induced erectile dysfunction; Primary osteoarthritis of right knee; Benign hypertensive heart and CKD, stage 3 (GFR 30-59), w CHF ([place]); Obesity (BMI 30.0-34.9); and Spinal stenosis of lumbar region on their problem list.  Screening Tests Immunization History  Administered Date(s) Administered   Influenza Split 12/16/2013, 12/02/2014   Influenza Whole 12/09/2012   Influenza, Seasonal, Injecte, Preservative Fre 12/01/2015   Influenza-Unspecified 11/19/2016, 11/30/2017   PFIZER Comirnaty(Gray Top)Covid-19 Tri-Sucrose Vaccine 07/26/2020   PFIZER(Purple Top)SARS-COV-2 Vaccination 05/21/2019, 06/12/2019   PPD Test 11/05/2013, 01/04/2015, 02/10/2016, 03/21/2017, 04/16/2018   Pfizer Covid-19 Vaccine Bivalent Booster 50yr & up 12/02/2020   Td  02/19/2005   Tdap 03/01/2015   Health Maintenance  Topic Date Due   Zoster Vaccines- Shingrix (1 of 2) Never done   Pneumonia Vaccine 67 Years old (1 - PCV) Never done   INFLUENZA VACCINE  09/19/2021   TETANUS/TDAP  02/28/2025   COLONOSCOPY (Pts 45-457yrInsurance coverage will need to be confirmed)  11/16/2030   COVID-19 Vaccine  Completed   Hepatitis C Screening  Completed   HPV VACCINES  Aged Out    Last colonoscopy: 10/2020  - External hemorrhoids found on perianal exam. - One 3 mm polyp in the transverse colon, removed with a cold snare. Resected andretrieved. - One 2 mm polyp in the sigmoid colon, removed with a cold snare. Resected and retrieved. - Non-bleeding internal hemorrhoids. - The examined portion of the ileum was normal. - Repeat colonoscopy in 10 years for surveillance.  Names of Other Physician/Practitioners you currently use: 1. [facility] here for primary care 2. Dr. KnJacqulynn Cadeteye doctor, last visit 03/2021 3. Dr. AdDelma Officer dentist, last visit 04/2021 4. None, derm, last visit - Due for annual skin check.   Patient Care Team: McUnk PintoMD as PCP - General (Internal Medicine) BeLorretta HarpMD as PCP - Cardiology (Cardiology) KaInda CastleMD (Inactive) as Consulting Physician (Gastroenterology)  Surgical: He  has a past  surgical history that includes Appendectomy; Knee arthroscopy; Rotator cuff repair; Hand surgery; Tonsillectomy; Knee arthroscopy (Right, 04/21/2012); Coronary angioplasty with stent (2012); left heart catheterization with coronary angiogram (N/A, 01/03/2011); percutaneous coronary stent intervention (pci-s) (N/A, 01/03/2011); Colonoscopy; Triceps tendon repair (Bilateral); Total knee arthroplasty (Right, 04/15/2017); Total knee arthroplasty (Right, 04/15/2017); Carpal tunnel release (Right, 2021); and Elbow surgery (Right, 2021). Family His family history includes Coronary artery disease in his  father; Diabetes in his brother and mother; HIV/AIDS in his brother; Heart attack in his mother; Heart disease in his father and mother; Hypertension in his brother; Obesity in his brother. Social history  He reports that he has never smoked. He has never used smokeless tobacco. He reports current alcohol use of about 10.0 standard drinks of alcohol per week. He reports that he does not use drugs.  MEDICARE WELLNESS OBJECTIVES: Physical activity: Exercise limited by: None identified Cardiac risk factors: Cardiac Risk Factors include: advanced age (>47mn, >>66women);male gender;dyslipidemia Depression/mood screen:      08/30/2021    2:08 PM  Depression screen PHQ 2/9  Decreased Interest 0  Down, Depressed, Hopeless 0  PHQ - 2 Score 0    ADLs:     08/30/2021    2:06 PM 05/22/2021   12:02 AM  In your present state of health, do you have any difficulty performing the following activities:  Hearing? 0 0  Vision? 0 0  Difficulty concentrating or making decisions? 0 0  Walking or climbing stairs? 0 0  Dressing or bathing? 0 0  Doing errands, shopping? 0 0  Preparing Food and eating ? N   Using the Toilet? N   In the past six months, have you accidently leaked urine? N   Do you have problems with loss of bowel control? N   Managing your Medications? N   Managing your Finances? N   Housekeeping or managing your Housekeeping? N      Cognitive Testing  Alert? Yes  Normal Appearance?Yes  Oriented to person? Yes  Place? Yes   Time? Yes  Recall of three objects?  Yes  Can perform simple calculations? Yes  Displays appropriate judgment?Yes  Can read the correct time from a watch face?Yes  EOL planning: Does Patient Have a Medical Advance Directive?: Yes Type of Advance Directive: Living will Does patient want to make changes to medical advance directive?: No - Patient declined   Objective:   Today's Vitals   08/30/21 0946  BP: 130/87  Pulse: (!) 57  Resp: 16  Temp: (!) 97.3 F  (36.3 C)  SpO2: 99%  Weight: 184 lb 6.4 oz (83.6 kg)   Body mass index is 29.32 kg/m.  General appearance: alert, no distress, WD/WN, male HEENT: normocephalic, sclerae anicteric, TMs pearly, nares patent, no discharge or erythema, pharynx normal Oral cavity: MMM, no lesions Neck: supple, no lymphadenopathy, no thyromegaly, no masses Heart: RRR, normal S1, S2, no murmurs Lungs: CTA bilaterally, no wheezes, rhonchi, or rales Abdomen: +bs, soft, non tender, non distended, no masses, no hepatomegaly, no splenomegaly Musculoskeletal: nontender, no swelling, no obvious deformity Extremities: no edema, no cyanosis, no clubbing Pulses: 2+ symmetric, upper and lower extremities, normal cap refill Neurological: alert, oriented x 3, CN2-12 intact, strength normal upper extremities and lower extremities, sensation normal throughout, DTRs 2+ throughout, no cerebellar signs, gait normal Psychiatric: normal affect, behavior normal, pleasant   EKG: 05/22/2021 NSR AAA [place] Not completed  Medicare Attestation I have personally reviewed: The patient's medical and social history Their use  of alcohol, tobacco or illicit drugs Their current medications and supplements The patient's functional ability including ADLs,fall risks, home safety risks, cognitive, and hearing and visual impairment Diet and physical activities Evidence for depression or mood disorders  The patient's weight, height, BMI, and visual acuity have been recorded in the chart.  I have made referrals, counseling, and provided education to the patient based on review of the above and I have provided the patient with a written personalized care plan for preventive services.     Darrol Jump, NP   08/30/2021

## 2021-08-30 NOTE — Patient Instructions (Signed)
Gabapentin Enacarbil Extended-Release Tablets What is this medication? GABAPENTIN (GA ba pen tin) treats nerve pain. It may also be used to treat restless legs syndrome (RLS). It works by calming overactive nerves in your body. This medicine may be used for other purposes; ask your health care provider or pharmacist if you have questions. COMMON BRAND NAME(S): Horizant What should I tell my care team before I take this medication? They need to know if you have any of these conditions: Alcohol or substance use disorder Kidney disease Lung or breathing disease Suicidal thoughts, plans, or attempt; a previous suicide attempt by you or a family member An unusual or allergic reaction to gabapentin, other medications, foods, dyes, or preservatives Pregnant or trying to get pregnant Breast-feeding How should I use this medication? Take this medication by mouth with a glass of water. Follow the directions on the prescription label. Take this medication with food. Do not cut, crush, or chew this medication. Take your medication at regular intervals. Do not take it more often than directed. Do not stop taking except on your care team's advice. A special MedGuide will be given to you by the pharmacist with each prescription and refill. Be sure to read this information carefully each time. Talk to your care team about the use of this medication in children. Special care may be needed. Overdosage: If you think you have taken too much of this medicine contact a poison control center or emergency room at once. NOTE: This medicine is only for you. Do not share this medicine with others. What if I miss a dose? If you miss a dose, wait and take the next dose at your regular time. Do not take double or extra doses. What may interact with this medication? Alcohol Antihistamines for allergy, cough, and cold Certain medications for anxiety or sleep Certain medications for depression like amitriptyline,  fluoxetine, sertraline Certain medications for seizures like phenobarbital, primidone Certain medications for stomach problems General anesthetics like halothane, isoflurane, methoxyflurane, propofol Local anesthetics like lidocaine, pramoxine, tetracaine Medications that relax muscles for surgery Narcotic medications for pain Phenothiazines like chlorpromazine, mesoridazine, prochlorperazine, thioridazine This list may not describe all possible interactions. Give your health care provider a list of all the medicines, herbs, non-prescription drugs, or dietary supplements you use. Also tell them if you smoke, drink alcohol, or use illegal drugs. Some items may interact with your medicine. What should I watch for while using this medication? Tell your care team if your symptoms do not start to get better or if they get worse. Do not stop taking except on your care team's advice. You may develop a severe reaction if you do. This medication may cause serious skin reactions. They can happen weeks to months after starting the medication. Contact your care team right away if you notice fevers or flu-like symptoms with a rash. The rash may be red or purple and then turn into blisters or peeling of the skin. Or, you might notice a red rash with swelling of the face, lips or lymph nodes in your neck or under your arms. You may get drowsy, dizzy, or have blurred vision. Do not drive, use machinery, or do anything that needs mental alertness until you know how this medication affects you. To reduce dizzy or fainting spells, do not sit or stand up quickly, especially if you are an older patient. Alcohol can increase drowsiness and dizziness. Watch for new or worsening thoughts of suicide or depression. This includes sudden changes in mood,  behaviors, or thoughts. These changes can happen at any time but are more common in the beginning of treatment or after a change in dose. Call your care team right away if you  experience these thoughts or worsening depression. Your mouth may get dry. Chewing sugarless gum or sucking hard candy, and drinking plenty of water may help. What side effects may I notice from receiving this medication? Side effects that you should report to your care team as soon as possible: Allergic reactions or angioedema--skin rash, itching, hives, swelling of the face, eyes, lips, tongue, arms, or legs, trouble swallowing or breathing Rash, fever, and swollen lymph nodes Thoughts of suicide or self harm, worsening mood, feelings of depression Trouble breathing Side effects that usually do not require medical attention (report to your care team if they continue or are bothersome): Dizziness Drowsiness Headache This list may not describe all possible side effects. Call your doctor for medical advice about side effects. You may report side effects to FDA at 1-800-FDA-1088. Where should I keep my medication? Keep out of the reach of children and pets. Store between 15 and 30 degrees C (59 and 86 degrees F). Throw away any unused medication after the expiration date. Keep this medication dry and away from moisture. NOTE: This sheet is a summary. It may not cover all possible information. If you have questions about this medicine, talk to your doctor, pharmacist, or health care provider.  2023 Elsevier/Gold Standard (2020-02-16 00:00:00)

## 2021-08-31 ENCOUNTER — Other Ambulatory Visit: Payer: Self-pay

## 2021-08-31 DIAGNOSIS — I2510 Atherosclerotic heart disease of native coronary artery without angina pectoris: Secondary | ICD-10-CM

## 2021-08-31 MED ORDER — CLOPIDOGREL BISULFATE 75 MG PO TABS: ORAL_TABLET | ORAL | 3 refills | Status: DC

## 2021-09-19 ENCOUNTER — Other Ambulatory Visit: Payer: Self-pay | Admitting: Internal Medicine

## 2021-09-19 ENCOUNTER — Encounter: Payer: Self-pay | Admitting: Internal Medicine

## 2021-09-19 DIAGNOSIS — E349 Endocrine disorder, unspecified: Secondary | ICD-10-CM

## 2021-09-19 MED ORDER — TESTOSTERONE CYPIONATE 200 MG/ML IM SOLN: INTRAMUSCULAR | 2 refills | Status: DC

## 2021-10-10 ENCOUNTER — Other Ambulatory Visit: Payer: Self-pay | Admitting: Internal Medicine

## 2021-10-10 ENCOUNTER — Encounter: Payer: Self-pay | Admitting: Internal Medicine

## 2021-10-10 DIAGNOSIS — F419 Anxiety disorder, unspecified: Secondary | ICD-10-CM

## 2021-10-10 MED ORDER — ALPRAZOLAM 0.5 MG PO TABS: ORAL_TABLET | ORAL | 0 refills | Status: DC

## 2021-11-09 ENCOUNTER — Other Ambulatory Visit: Payer: Self-pay

## 2021-11-09 ENCOUNTER — Ambulatory Visit (INDEPENDENT_AMBULATORY_CARE_PROVIDER_SITE_OTHER): Payer: 59 | Admitting: Nurse Practitioner

## 2021-11-09 ENCOUNTER — Encounter: Payer: Self-pay | Admitting: Nurse Practitioner

## 2021-11-09 VITALS — BP 110/68 | HR 62 | Temp 97.9°F | Ht 66.5 in | Wt 191.0 lb

## 2021-11-09 DIAGNOSIS — M791 Myalgia, unspecified site: Secondary | ICD-10-CM | POA: Diagnosis not present

## 2021-11-09 DIAGNOSIS — R5383 Other fatigue: Secondary | ICD-10-CM | POA: Diagnosis not present

## 2021-11-09 DIAGNOSIS — Z1152 Encounter for screening for COVID-19: Secondary | ICD-10-CM

## 2021-11-09 LAB — POC COVID19 BINAXNOW: SARS Coronavirus 2 Ag: NEGATIVE

## 2021-11-09 MED ORDER — MELOXICAM 15 MG PO TABS: ORAL_TABLET | ORAL | 3 refills | Status: DC

## 2021-11-09 MED ORDER — AZITHROMYCIN 250 MG PO TABS: ORAL_TABLET | ORAL | 1 refills | Status: DC

## 2021-11-09 NOTE — Progress Notes (Signed)
Assessment and Plan:  Marvin Carter was seen today for a episodic visit.  Diagnoses and all order for this visit:  1. Encounter for screening for COVID-19 Considering patient will be leaving to go out of town and wife is currently Covid positive I will provide prophylactic abx tmt if symptoms persist or worsen. Do not take unless symptoms worsen.  - POC COVID-19 NEGATIVE - azithromycin (ZITHROMAX) 250 MG tablet; Take 2 tablets (500 mg) on Day 1 followed by 1 tablet  (250 mg) daily until complete.  Dispense: 6 each; Refill: 1  2. Other fatigue Stay well hydrated. Rest  3.  Muscle soreness Tylenol OTC PRN as directed.  Notify office for further evaluation and treatment, questions or concerns if s/s fail to improve. The risks and benefits of my recommendations, as well as other treatment options were discussed with the patient today. Questions were answered.  Further disposition pending results of labs. Discussed med's effects and SE's.    Over 15 minutes of exam, counseling, chart review, and critical decision making was performed.   Future Appointments  Date Time Provider [place]  12/05/2021  9:30 AM Unk Pinto, MD [facility] None  05/29/2022  3:00 PM Unk Pinto, MD [facility] None    ------------------------------------------------------------------------------------------------------------------   HPI BP 110/68   Pulse 62   Temp 97.9 F (36.6 C)   Ht 5' 6.5" (1.689 m)   Wt 191 lb (86.6 kg)   SpO2 97%   BMI 30.37 kg/m   Patient reports to office stating that wife tested positive for Covid two days ago.  Yesterday he started to feel muscle soreness and fatigue.  He reports today for a Covid test and possible anaphylactic treatment if symptoms worsen as he will leaving to go out of town on Saturday.  Past Medical History:  Diagnosis Date   Anxiety    Atrial flutter, paroxysmal ([place]) 01/04/2011   CAD (coronary artery disease), native coronary  artery 01/04/2011   Diverticulitis    Diverticulosis    GERD (gastroesophageal reflux disease)    History of meniscectomy of right knee 01/02/2011   Hypertension    NSVT (nonsustained ventricular tachycardia) ([place]) 01/02/2011   Primary osteoarthritis of right knee 04/03/2017   Stented coronary artery 01/04/2011   LAD DES Resolute.     Allergies  Allergen Reactions   Hctz [Hydrochlorothiazide]    Tape Rash    Clear tape    Current Outpatient Medications on File Prior to Visit  Medication Sig   acetaminophen (TYLENOL) 325 MG tablet Take 2 tablets (650 mg total) by mouth every 4 (four) hours as needed for mild pain ((score 1 to 3) or temp > 100.5).   ALPRAZolam (XANAX) 0.5 MG tablet Take  1/2-1 tablet  ONLY  if needed for Sleep &  limit to 5 days /week to avoid Addiction & Dementia (( Should last 12 weeks til November 14 ))   Ascorbic Acid (VITAMIN C PO) Take 1 tablet by mouth daily as needed (for immune system support).   ASPIRIN 81 PO Take 81 mg by mouth daily.    atenolol (TENORMIN) 100 MG tablet TAKE 1 TABLET BY MOUTH DAILY FOR BLOOD PRESSURE   atorvastatin (LIPITOR) 40 MG tablet TAKE 1 TABLET BY MOUTH DAILY FOR CHOLESTEROL   buPROPion (WELLBUTRIN XL) 300 MG 24 hr tablet TAKE 1 TABLET BY MOUTH EVERY MORNING FOR MOOD OR FOCUS OR CONCENTRATION   clopidogrel (PLAVIX) 75 MG tablet Take 1 tablet Daily to prevent Blood Clots   COVID-19  mRNA bivalent vaccine, Pfizer, (PFIZER COVID-19 VAC BIVALENT) injection Inject into the muscle.   cyclobenzaprine (FLEXERIL) 10 MG tablet Take 1/2 to 1 tablet 3 x /day as needed for Muscle Spasm   diclofenac sodium (VOLTAREN) 1 % GEL Apply 2 to 4 grams 2 to 4 x / day   diltiazem (CARDIZEM CD) 240 MG 24 hr capsule TAKE 1 CAPSULE BY MOUTH DAILY FOR BLOOD PRESSURE   doxazosin (CARDURA) 8 MG tablet TAKE 1 TABLET BY MOUTH AT BEDTIME FOR BLOOD PRESSURE OR PROSTATE   furosemide (LASIX) 80 MG tablet TAKE 1 TABLET BY MOUTH EVERY DAY FOR BLOOD PRESSURE AND FLUID  RETENTION OR ANKLE SWELLING   Magnesium 400 MG CAPS Take 2 capsules by mouth daily.   Multiple Vitamin (MULTIVITAMIN WITH MINERALS) TABS Take 2 tablets by mouth daily. Mega Men Multivitamin.   nitroGLYCERIN (NITROSTAT) 0.4 MG SL tablet Dissolve 1 tablet under tongue every 3 to 5 minutes as needed for Chest Pain ( Do NOT take with Cialis )   olmesartan (BENICAR) 40 MG tablet Take 1/2  to 1 tablet Daily for BP   pantoprazole (PROTONIX) 40 MG tablet TAKE 1 TABLET BY MOUTH DAILY FOR STOMACH ACID OR INDIGESTION OR HEART BURN   tadalafil (CIALIS) 20 MG tablet Take 1/2 to 1 tablet every 2 to 3 days as needed for XXXX   tamsulosin (FLOMAX) 0.4 MG CAPS capsule TAKE 1 CAPSULE BY MOUTH AT BEDTIME FOR PROSTATE/URINE FLOW   testosterone cypionate (DEPOTESTOSTERONE CYPIONATE) 200 MG/ML injection INJECT 1 MLS INTRAMUSCULARLY EVERY WEEK   No current facility-administered medications on file prior to visit.    ROS: all negative except what is noted in the HPI.   Physical Exam:  BP 110/68   Pulse 62   Temp 97.9 F (36.6 C)   Ht 5' 6.5" (1.689 m)   Wt 191 lb (86.6 kg)   SpO2 97%   BMI 30.37 kg/m   General Appearance: NAD.  Awake, conversant and cooperative. Eyes: PERRLA, EOMs intact.  Sclera white.  Conjunctiva without erythema. Sinuses: No frontal/maxillary tenderness.  No nasal discharge. Nares patent.  ENT/Mouth: Ext aud canals clear.  Bilateral TMs w/DOL and without erythema or bulging. Hearing intact.  Posterior pharynx without swelling or exudate.  Tonsils without swelling or erythema.  Neck: Supple.  No masses, nodules or thyromegaly. Respiratory: Effort is regular with non-labored breathing. Breath sounds are equal bilaterally without rales, rhonchi, wheezing or stridor.  Cardio: RRR with no MRGs. Brisk peripheral pulses without edema.  Abdomen: Active BS in all four quadrants.  Soft and non-tender without guarding, rebound tenderness, hernias or masses. Lymphatics: Non tender without  lymphadenopathy.  Musculoskeletal: Full ROM, 5/5 strength, normal ambulation.  No clubbing or cyanosis. Skin: Appropriate color for ethnicity. Warm without rashes, lesions, ecchymosis, ulcers.  Neuro: CN II-XII grossly normal. Normal muscle tone without cerebellar symptoms and intact sensation.   Psych: AO X 3,  appropriate mood and affect, insight and judgment.     Darrol Jump, NP 1:07 PM [facility]

## 2021-11-09 NOTE — Patient Instructions (Signed)

## 2021-11-16 ENCOUNTER — Other Ambulatory Visit: Payer: Self-pay | Admitting: Nurse Practitioner

## 2021-12-04 NOTE — Patient Instructions (Signed)

## 2021-12-04 NOTE — Progress Notes (Unsigned)
Future Appointments  Date Time Provider [place]  11/22/2020 11:30 AM Unk Pinto, MD [facility] None  05/22/2021  3:00 PM Unk Pinto, MD [facility] None    History of Present Illness:       This very nice 66 y.o. MWM presents for 6 month follow up with HTN, HLD, Pre-Diabetes and Vitamin D Deficiency.          Patient is treated for HTN  (1989) & BP has been controlled at home. Today's BP is                       .  Patient had a Negative Heart Cath in 2009 and in 2012 presented with ACS and had PCA/Stent.  Patient has CKD2-3a  (GFR  56-60 ) attributed to his HTVD.  Patient has had no complaints of any cardiac type chest pain, palpitations, dyspnea Vertell Limber /PND, dizziness, claudication or dependent edema.        Hyperlipidemia is controlled with diet & Atorvastatin. Patient denies myalgias or other med SE's. Last Lipids were at goal:  Lab Results  Component Value Date   CHOL 140 05/22/2021   HDL 47 05/22/2021   LDLCALC 68 05/22/2021   TRIG 170 (H) 05/22/2021   CHOLHDL 3.0 05/22/2021     Also, the patient has history of PreDiabetes (A1c 5.8% /2008) and has had no symptoms of reactive hypoglycemia, diabetic polys, paresthesias or visual blurring.  Last A1c was normal & at goal:  Lab Results  Component Value Date   HGBA1C 5.3 05/22/2021             Patient has hx/o Testosterone Deficiency & is on parenteral replacement with improved sense of well being.                                                        Further, the patient also has history of Vitamin D Deficiency  ("19" /2009) and supplements vitamin D without any suspected side-effects. Last vitamin D was elevated & dose was decreased:  Lab Results  Component Value Date   VD25OH 108 (H) 05/22/2021        Current Outpatient Medications  Medication Instructions   acetaminophen (TYLENOL) 650 mg, Oral, Every 4 hours PRN   ALPRAZolam (XANAX) 0.5 MG tablet Take  1/2-1 tablet  ONLY  if needed     Ascorbic Acid (VITAMIN C PO) 1 tablet Daily PRN   ASPIRIN 81  Daily   atenolol (TENORMIN) 100 MG tablet TAKE 1 TABLET  DAILY    atorvastatin (LIPITOR) 40 MG tablet TAKE 1 TABLET  DAILY    buPROPion  XL 300 MG 24 hr tablet TAKE 1 TABLET EVERY MORNING    clopidogrel (PLAVIX) 75 MG tablet Take 1 tablet Daily to prevent Blood Clots   cyclobenzaprine  10 MG tablet Take 1/2 to 1 tablet 3 x /day as needed    diclofenac 1 % GEL Apply 2 to 4 grams 2 to 4 x / day   Diltiazem CD 240 MG 24 hr capsule TAKE 1 CAPSULE  DAILY   doxazosin (CARDURA) 8 MG tablet TAKE 1 TABLET  AT BEDTIME    furosemide (LASIX) 80 MG tablet TAKE 1 TABLET EVERY DAY    Magnesium 400 MG CAPS 2 capsules  Daily   meloxicam (  MOBIC) 15 MG tablet TAKE 1/2 TO 1 TABLET EVERY DAY   Multiple Vitamin ( 2 tablets,  Daily, Mega Men Multivitamin.   nitroGLYCERIN 0.4 MG SL tablet Dissolve 1 tablet under tongue every 3 to 5 minutes as needed for Chest Pain ( Do NOT take with Cialis )   olmesartan (BENICAR) 40 MG tablet Take 1/2  to 1 tablet Daily for BP   pantoprazole (PROTONIX) 40 MG tablet TAKE 1 TABLET DAILY    tadalafil (CIALIS) 20 MG tablet Take 1/2 to 1 tablet every 2 to 3 days as needed   tamsulosin (FLOMAX) 0.4 MG CAPS capsule TAKE 1 CAPSULE  EVERY NIGHT   testosterone cypio  200 MG/ML injection INJECT 1 MLS IM EVERY WEEK     Allergies  Allergen Reactions   Hctz [Hydrochlorothiazide]    Tape Rash    Clear tape     PMHx:   Past Medical History:  Diagnosis Date   Anxiety    Atrial flutter, paroxysmal ([facility]) 01/04/2011   CAD (coronary artery disease), native coronary artery 01/04/2011   Diverticulitis    Diverticulosis    GERD (gastroesophageal reflux disease)    History of meniscectomy of right knee 01/02/2011   Hypertension    NSVT (nonsustained ventricular tachycardia) ([place]) 01/02/2011   Primary osteoarthritis of right knee 04/03/2017   Stented coronary artery 01/04/2011   LAD DES Resolute.     Immunization  History  Administered Date(s) Administered   Influenza Split 12/16/2013, 12/02/2014   Influenza Whole 12/09/2012   Influenza, Seasonal, Injecte, Preservative Fre 12/01/2015   Influenza-Unspecified 11/19/2016, 11/30/2017   PFIZER Comirnaty(Gray Top)Covid-19 Tri-Sucrose Vaccine 07/26/2020   PFIZER(Purple Top)SARS-COV-2 Vaccination 05/21/2019, 06/12/2019   PPD Test 11/05/2013, 01/04/2015, 02/10/2016, 03/21/2017, 04/16/2018   Td 02/19/2005   Tdap 03/01/2015     Past Surgical History:  Procedure Laterality Date   APPENDECTOMY     CARPAL TUNNEL RELEASE Right 2021   Dr. Grandville Silos   COLONOSCOPY     CORONARY ANGIOPLASTY WITH STENT PLACEMENT  2012   ELBOW SURGERY Right 2021   Cyst excision/nerve decompression, Dr. Grandville Silos   HAND SURGERY     right   KNEE ARTHROSCOPY     x 8 left   KNEE ARTHROSCOPY Right 04/21/2012   Procedure: ARTHROSCOPY KNEE;  Surgeon: Lorn Junes, MD;  Location: [facility];  Service: Orthopedics;  Laterality: Right;  medial and lateral meniscectomies   LEFT HEART CATHETERIZATION WITH CORONARY ANGIOGRAM N/A 01/03/2011   Procedure: LEFT HEART CATHETERIZATION WITH CORONARY ANGIOGRAM;  Surgeon: Lorretta Harp, MD;  Location[facility];  Service: Cardiovascular;  Laterality: N/A;   PERCUTANEOUS CORONARY STENT INTERVENTION (PCI-S) N/A 01/03/2011   Procedure: PERCUTANEOUS CORONARY STENT INTERVENTION (PCI-S);  Surgeon: Lorretta Harp, MD;  Location: [facility] CATH LAB;  Service: Cardiovascular;  Laterality: N/A;   ROTATOR CUFF REPAIR     bilateral   TONSILLECTOMY     TOTAL KNEE ARTHROPLASTY Right 04/15/2017   TOTAL KNEE ARTHROPLASTY Right 04/15/2017   Procedure: TOTAL KNEE ARTHROPLASTY;  Surgeon: Elsie Saas, MD;  Location: [facility];  Service: Orthopedics;  Laterality: Right;   TRICEPS TENDON REPAIR Bilateral     FHx:    Reviewed / unchanged  SHx:    Reviewed / unchanged   Systems Review:  Constitutional: Denies fever, chills, wt changes, headaches, insomnia, fatigue,  night sweats, change in appetite. Eyes: Denies redness, blurred vision, diplopia, discharge, itchy, watery eyes.  ENT: Denies discharge, congestion, post nasal drip, epistaxis, sore throat, earache, hearing loss,  dental pain, tinnitus, vertigo, sinus pain, snoring.  CV: Denies chest pain, palpitations, irregular heartbeat, syncope, dyspnea, diaphoresis, orthopnea, PND, claudication or edema. Respiratory: denies cough, dyspnea, DOE, pleurisy, hoarseness, laryngitis, wheezing.  Gastrointestinal: Denies dysphagia, odynophagia, heartburn, reflux, water brash, abdominal pain or cramps, nausea, vomiting, bloating, diarrhea, constipation, hematemesis, melena, hematochezia  or hemorrhoids. Genitourinary: Denies dysuria, frequency, urgency, nocturia, hesitancy, discharge, hematuria or flank pain. Musculoskeletal: Denies arthralgias, myalgias, stiffness, jt. swelling, pain, limping or strain/sprain.  Skin: Denies pruritus, rash, hives, warts, acne, eczema or change in skin lesion(s). Neuro: No weakness, tremor, incoordination, spasms, paresthesia or pain. Psychiatric: Denies confusion, memory loss or sensory loss. Endo: Denies change in weight, skin or hair change.  Heme/Lymph: No excessive bleeding, bruising or enlarged lymph nodes.  Physical Exam  There were no vitals taken for this visit.  Appears  well nourished, well groomed  and in no distress.  Eyes: PERRLA, EOMs, conjunctiva no swelling or erythema. Sinuses: No frontal/maxillary tenderness ENT/Mouth: EAC's clear, TM's nl w/o erythema, bulging. Nares clear w/o erythema, swelling, exudates. Oropharynx clear without erythema or exudates. Oral hygiene is good. Tongue normal, non obstructing. Hearing intact.  Neck: Supple. Thyroid not palpable. Car 2+/2+ without bruits, nodes or JVD. Chest: Respirations nl with BS clear & equal w/o rales, rhonchi, wheezing or stridor.  Cor: Heart sounds normal w/ regular rate and rhythm without sig. murmurs,  gallops, clicks or rubs. Peripheral pulses normal and equal  without edema.  Abdomen: Soft & bowel sounds normal. Non-tender w/o guarding, rebound, hernias, masses or organomegaly.  Lymphatics: Unremarkable.  Musculoskeletal: Full ROM all peripheral extremities, joint stability, 5/5 strength and normal gait.  Skin: Warm, dry without exposed rashes, lesions or ecchymosis apparent.  Neuro: Cranial nerves intact, reflexes equal bilaterally. Sensory-motor testing grossly intact. Tendon reflexes grossly intact.  Pysch: Alert & oriented x 3.  Insight and judgement nl & appropriate. No ideations.  Assessment and Plan:   1. Essential hypertension   - Continue medication, monitor blood pressure at home.  - Continue DASH diet.  Reminder to go to the ER if any CP,  SOB, nausea, dizziness, severe HA, changes vision/speech.   - CBC with Differential/Platelet - COMPLETE METABOLIC PANEL WITH GFR - Magnesium - TSH  2. Hyperlipidemia, mixed   - Continue diet/meds, exercise,& lifestyle modifications.  - Continue monitor periodic cholesterol/liver & renal functions    - Lipid panel - TSH  3. Abnormal glucose   - Continue diet, exercise  - Lifestyle modifications.  - Monitor appropriate labs    - Hemoglobin A1c - Insulin, random  4. Vitamin D deficiency  - VITAMIN D 25 Hydroxy   5. Chronic renal impairment, stage 3a ([facility])  - COMPLETE METABOLIC PANEL WITH GFR  6. Testosterone deficiency  - Testosterone  7. Medication management  - CBC with Differential/Platelet - COMPLETE METABOLIC PANEL WITH GFR - Magnesium - Lipid panel - TSH - Hemoglobin A1c - Insulin, random - VITAMIN D 25 Hydroxy  - Testosterone         Discussed  regular exercise, BP monitoring, weight control to achieve/maintain BMI less than 25 and discussed med and SE's. Recommended labs to assess and monitor clinical status with further disposition pending results of labs.  I discussed the assessment and treatment  plan with the patient. The patient was provided an opportunity to ask questions and all were answered. The patient agreed with the plan and demonstrated an understanding of the instructions.  I provided over 30 minutes of exam, counseling, chart review  and  complex critical decision making.        The patient was advised to call back or seek an in-person evaluation if the symptoms worsen or if the condition fails to improve as anticipated.   Kirtland Bouchard, MD

## 2021-12-05 ENCOUNTER — Ambulatory Visit: Payer: 59 | Admitting: Internal Medicine

## 2021-12-05 ENCOUNTER — Encounter: Payer: Self-pay | Admitting: Internal Medicine

## 2021-12-05 VITALS — BP 128/80 | HR 55 | Temp 97.9°F | Resp 16 | Ht 66.5 in | Wt 186.6 lb

## 2021-12-05 DIAGNOSIS — Z79899 Other long term (current) drug therapy: Secondary | ICD-10-CM

## 2021-12-05 DIAGNOSIS — E559 Vitamin D deficiency, unspecified: Secondary | ICD-10-CM | POA: Diagnosis not present

## 2021-12-05 DIAGNOSIS — I10 Essential (primary) hypertension: Secondary | ICD-10-CM | POA: Diagnosis not present

## 2021-12-05 DIAGNOSIS — R7309 Other abnormal glucose: Secondary | ICD-10-CM

## 2021-12-05 DIAGNOSIS — N1831 Chronic kidney disease, stage 3a: Secondary | ICD-10-CM

## 2021-12-05 DIAGNOSIS — E349 Endocrine disorder, unspecified: Secondary | ICD-10-CM

## 2021-12-05 DIAGNOSIS — E782 Mixed hyperlipidemia: Secondary | ICD-10-CM | POA: Diagnosis not present

## 2021-12-06 LAB — COMPLETE METABOLIC PANEL WITH GFR
AG Ratio: 1.6 (calc) (ref 1.0–2.5)
ALT: 35 U/L (ref 9–46)
AST: 37 U/L — ABNORMAL HIGH (ref 10–35)
Albumin: 4.6 g/dL (ref 3.6–5.1)
Alkaline phosphatase (APISO): 50 U/L (ref 35–144)
BUN/Creatinine Ratio: 19 (calc) (ref 6–22)
BUN: 26 mg/dL — ABNORMAL HIGH (ref 7–25)
CO2: 25 mmol/L (ref 20–32)
Calcium: 9.8 mg/dL (ref 8.6–10.3)
Chloride: 104 mmol/L (ref 98–110)
Creat: 1.35 mg/dL (ref 0.70–1.35)
Globulin: 2.8 g/dL (calc) (ref 1.9–3.7)
Glucose, Bld: 86 mg/dL (ref 65–99)
Potassium: 4.2 mmol/L (ref 3.5–5.3)
Sodium: 141 mmol/L (ref 135–146)
Total Bilirubin: 1.1 mg/dL (ref 0.2–1.2)
Total Protein: 7.4 g/dL (ref 6.1–8.1)
eGFR: 58 mL/min/{1.73_m2} — ABNORMAL LOW (ref >=60)

## 2021-12-06 LAB — CBC WITH DIFFERENTIAL/PLATELET
Absolute Monocytes: 830 cells/uL (ref 200–950)
Basophils Absolute: 32 cells/uL (ref 0–200)
Basophils Relative: 0.4 %
Eosinophils Absolute: 182 cells/uL (ref 15–500)
Eosinophils Relative: 2.3 %
HCT: 43.2 % (ref 38.5–50.0)
Hemoglobin: 14.8 g/dL (ref 13.2–17.1)
Lymphs Abs: 1738 cells/uL (ref 850–3900)
MCH: 34.4 pg — ABNORMAL HIGH (ref 27.0–33.0)
MCHC: 34.3 g/dL (ref 32.0–36.0)
MCV: 100.5 fL — ABNORMAL HIGH (ref 80.0–100.0)
MPV: 10.5 fL (ref 7.5–12.5)
Monocytes Relative: 10.5 %
Neutro Abs: 5119 cells/uL (ref 1500–7800)
Neutrophils Relative %: 64.8 %
Platelets: 195 10*3/uL (ref 140–400)
RBC: 4.3 10*6/uL (ref 4.20–5.80)
RDW: 11.8 % (ref 11.0–15.0)
Total Lymphocyte: 22 %
WBC: 7.9 10*3/uL (ref 3.8–10.8)

## 2021-12-06 LAB — INSULIN, RANDOM: Insulin: 4.5 u[IU]/mL

## 2021-12-06 LAB — LIPID PANEL
Cholesterol: 132 mg/dL (ref <200)
HDL: 58 mg/dL (ref >=40)
LDL Cholesterol (Calc): 58 mg/dL (calc)
Non-HDL Cholesterol (Calc): 74 mg/dL (calc) (ref <130)
Total CHOL/HDL Ratio: 2.3 (calc) (ref <5.0)
Triglycerides: 79 mg/dL (ref <150)

## 2021-12-06 LAB — HEMOGLOBIN A1C
Hgb A1c MFr Bld: 5.3 % of total Hgb (ref <5.7)
Mean Plasma Glucose: 105 mg/dL
eAG (mmol/L): 5.8 mmol/L

## 2021-12-06 LAB — VITAMIN D 25 HYDROXY (VIT D DEFICIENCY, FRACTURES): Vit D, 25-Hydroxy: 101 ng/mL — ABNORMAL HIGH (ref 30–100)

## 2021-12-06 LAB — MAGNESIUM: Magnesium: 1.6 mg/dL (ref 1.5–2.5)

## 2021-12-06 LAB — TESTOSTERONE: Testosterone: 2946 ng/dL — ABNORMAL HIGH (ref 250–827)

## 2021-12-06 LAB — TSH: TSH: 1.12 mIU/L (ref 0.40–4.50)

## 2021-12-06 NOTE — Progress Notes (Signed)
<><><><><><><><><><><><><><><><><><><><><><><><><><><><><><><><><> <><><><><><><><><><><><><><><><><><><><><><><><><><><><><><><><><> -   Test results slightly outside the reference range are not unusual. If there is anything important, I will review this with you,  otherwise it is considered normal test values.  If you have further questions,  please do not hesitate to contact me at the office or via My Chart.  <><><><><><><><><><><><><><><><><><><><><><><><><><><><><><><><><> <><><><><><><><><><><><><><><><><><><><><><><><><><><><><><><><><>  -  Testosterone level is extremely high from shot the previous day <><><><><><><><><><><><><><><><><><><><><><><><><><><><><><><><><>  - Kidney functions still look a little dehydrated  (Again)    Very important to drink adequate amounts of fluids to prevent permanent damage    - Recommend drink at least 6 bottles (16 ounces) of fluids /water /day = 96 Oz ~100 oz  - 100 oz = 3,000 cc or 3 liters / day  - >> That's 1 &1/2 bottles of a 2 liter soda bottle /day !  <><><><><><><><><><><><><><><><><><><><><><><><><><><><><><><><><>  - Vitamin D = 101 - Excellent    -   Please keep dose same  <><><><><><><><><><><><><><><><><><><><><><><><><><><><><><><><><>  -  Total  Chol =  132     - Excellent            (  Ideal  or  Goal is less than 180  !  )  & -  Bad / Dangerous LDL  Chol =  58   - also Excellent             (  Ideal  or  Goal is less than 70  !  )   - Very low risk for Heart Attack  / Stroke <><><><><><><><><><><><><><><><><><><><><><><><><><><><><><><><><>  -  A1c - Normal - No Diabetes  - Great !  <><><><><><><><><><><><><><><><><><><><><><><><><><><><><><><><><>  -  All Else - CBC - Kidneys - Electrolytes - Liver - Magnesium & Thyroid    - all  Normal /  OK <><><><><><><><><><><><><><><><><><><><><><><><><><><><><><><><><> <><><><><><><><><><><><><><><><><><><><><><><><><><><><><><><><><>

## 2021-12-26 ENCOUNTER — Encounter: Payer: Self-pay | Admitting: Internal Medicine

## 2021-12-31 ENCOUNTER — Encounter: Payer: Self-pay | Admitting: Internal Medicine

## 2021-12-31 ENCOUNTER — Other Ambulatory Visit: Payer: Self-pay | Admitting: Internal Medicine

## 2021-12-31 DIAGNOSIS — F419 Anxiety disorder, unspecified: Secondary | ICD-10-CM

## 2022-01-01 ENCOUNTER — Other Ambulatory Visit: Payer: Self-pay

## 2022-01-01 ENCOUNTER — Encounter: Payer: Self-pay | Admitting: Internal Medicine

## 2022-01-01 MED ORDER — FUROSEMIDE 80 MG PO TABS: ORAL_TABLET | ORAL | 3 refills | Status: DC

## 2022-01-01 MED ORDER — ALPRAZOLAM 0.5 MG PO TABS: ORAL_TABLET | ORAL | 0 refills | Status: DC

## 2022-02-13 ENCOUNTER — Other Ambulatory Visit: Payer: Self-pay | Admitting: Internal Medicine

## 2022-02-13 ENCOUNTER — Encounter: Payer: Self-pay | Admitting: Internal Medicine

## 2022-02-13 DIAGNOSIS — N529 Male erectile dysfunction, unspecified: Secondary | ICD-10-CM

## 2022-02-13 MED ORDER — TADALAFIL 20 MG PO TABS: ORAL_TABLET | ORAL | 1 refills | Status: DC

## 2022-02-14 ENCOUNTER — Encounter: Payer: Self-pay | Admitting: Internal Medicine

## 2022-02-15 NOTE — Telephone Encounter (Signed)
Will need a visit to check for flu

## 2022-03-08 ENCOUNTER — Encounter: Payer: Self-pay | Admitting: Internal Medicine

## 2022-03-08 DIAGNOSIS — F419 Anxiety disorder, unspecified: Secondary | ICD-10-CM

## 2022-03-08 DIAGNOSIS — I2510 Atherosclerotic heart disease of native coronary artery without angina pectoris: Secondary | ICD-10-CM

## 2022-03-08 MED ORDER — ALPRAZOLAM 0.5 MG PO TABS: ORAL_TABLET | ORAL | 0 refills | Status: DC

## 2022-03-08 MED ORDER — CLOPIDOGREL BISULFATE 75 MG PO TABS: ORAL_TABLET | ORAL | 3 refills | Status: DC

## 2022-03-14 NOTE — Progress Notes (Signed)
FOLLOW UP Assessment:   Essential hypertension Continue medications Discussed DASH (Dietary Approaches to Stop Hypertension) DASH diet is lower in sodium than a typical American diet. Cut back on foods that are high in saturated fat, cholesterol, and trans fats. Eat more whole-grain foods, fish, poultry, and nuts Remain active and exercise as tolerated daily.  Monitor BP at home-Call if greater than 130/80.  Check CMP/CBC  NSVT (nonsustained ventricular tachycardia) (HCC) Controlled. No longer follows with Cardiology,  Continue medications; Clopidogrel, Atenolol, Diltiazem, Doxazosin, Furosemide Continue to monitor  Atrial flutter, paroxysmal (HCC) Controlled. No longer follows with Cardiology Continue  medications as directed  Coronary artery disease involving native coronary artery of native heart without angina pectoris Has stent placed Controlled. Continue medications Continue to monitor  Hyperlipidemia Discussed lifestyle modifications. Recommended diet heavy in fruits and veggies, omega 3's. Decrease consumption of animal meats, cheeses, and dairy products. Remain active and exercise as tolerated. Continue to monitor. Check lipids/TSH   Benign hypertensive heart and CKD, stage 3 (GFR 30-59), w CHF (HCC) Discussed how what you eat and drink can aide in kidney protection. Stay well hydrated. Avoid high salt foods. Avoid NSAIDS. Keep BP and BG well controlled.   Take medications as prescribed. Remain active and exercise as tolerated daily. Maintain weight.  Continue to monitor.   Gastroesophageal reflux disease, unspecified whether esophagitis present Continue medications; Pantoprazole No suspected reflux complications (Barret/stricture). Lifestyle modification:  wt loss, avoid meals 2-3h before bedtime. Consider eliminating food triggers:  chocolate, caffeine, EtOH, acid/spicy food.   Diverticulosis No recent flare. Discussed high fiber diet. Stay well  hydrated. Continue to monitor   Other abnormal glucose (hx of prediabetes) Education: Reviewed 'ABCs' of diabetes management  A1C (<7) Blood pressure (<130/80) Cholesterol (LDL <70) Continue Eye Exam yearly  Continue Dental Exam Q6 mo Discussed dietary recommendations Discussed Physical Activity recommendations  Vitamin D deficiency At goal. Continue supplement. Continue to monitor   Testosterone deficiency Continue weekly injections Continue to monitor levels  Spinal stenosis of lumbar region, unspecified whether neurogenic claudication present Follows with [facility] Continue to monitor  Medication management All medications discussed and reviewed in full. All questions and concerns regarding medications addressed.    Orders Placed This Encounter  Procedures   CBC with Differential/Platelet   COMPLETE METABOLIC PANEL WITH GFR   Lipid panel   TSH   Hemoglobin A1c   VITAMIN D 25 Hydroxy (Vit-D Deficiency, Fractures)    Notify office for further evaluation and treatment, questions or concerns if any reported s/s fail to improve.   The patient was advised to call back or seek an in-person evaluation if any symptoms worsen or if the condition fails to improve as anticipated.   Further disposition pending results of labs. Discussed med's effects and SE's.    I discussed the assessment and treatment plan with the patient. The patient was provided an opportunity to ask questions and all were answered. The patient agreed with the plan and demonstrated an understanding of the instructions.  Discussed med's effects and SE's. Screening labs and tests as requested with regular follow-up as recommended.  I provided 20 minutes of face-to-face time during this encounter including counseling, chart review, and critical decision making was preformed.   Future Appointments  Date Time Provider Department Center  03/15/2022  9:30 AM Adela Glimpse, NP [facility] None   07/04/2022 10:00 AM Lucky Cowboy, MD [facility] None     Subjective:  Marvin Carter is a 67 y.o. male who presents for a  general 3 month follow up. He has Prolapsed external hemorrhoids; Hemorrhoids, internal, with bleeding; NSVT (nonsustained ventricular tachycardia) (HCC); Atrial flutter, paroxysmal (HCC); CAD (coronary artery disease), native coronary artery; Stented coronary artery; Essential hypertension; GERD (gastroesophageal reflux disease); Diverticulosis; Other abnormal glucose (hx of prediabetes); Vitamin D deficiency; Medication management; Testosterone deficiency; Drug-induced erectile dysfunction; Primary osteoarthritis of right knee; Benign hypertensive heart and CKD, stage 3 (GFR 30-59), w CHF (HCC); Obesity (BMI 30.0-34.9); and Spinal stenosis of lumbar region on their problem list.  Overall patient reports feeling well.  He had a recent lumbar ablation 09/2020.  Has seen some mild improvement as times passes but continues to have difficulty standing or walking for long periods of time.  Saw [facility] 09/11/23 for back pain an dleft sided neck pain. He had a bilateral lumbar radiofrequency neurotomy procedure 07/31/21.  He had pain initially after the first 3 weeks then then pain started to calm.  He is now doing well overall.  Continues on meloxicam 7.5 mg and Advil Tylenol. Has Flexeril PRN. He will take Oxy 5 mg PRN. Does not wish to pursue any further surgical intervention at this time.  He receives weekly testosterone injections.    BMI is There is no height or weight on file to calculate BMI., he has been working on diet and exercise.  He works out at [employer] 3 times a week. Wt Readings from Last 3 Encounters:  12/05/21 186 lb 9.6 oz (84.6 kg)  11/09/21 191 lb (86.6 kg)  08/30/21 184 lb 6.4 oz (83.6 kg)   His blood pressure has been controlled at home, today their BP is   He does workout. He denies chest pain, shortness of breath, dizziness.  He is on  cholesterol medication Atorvastatin and denies myalgias. His cholesterol is at goal other than elevated triglycerides. The cholesterol last visit was:   Lab Results  Component Value Date   CHOL 132 12/05/2021   HDL 58 12/05/2021   LDLCALC 58 12/05/2021   TRIG 79 12/05/2021   CHOLHDL 2.3 12/05/2021   He has been working on diet and exercise for management of prediabetes, and denies polydipsia and polyuria. Last A1C in the office was:  Lab Results  Component Value Date   HGBA1C 5.3 12/05/2021   Last GFR Lab Results  Component Value Date   EGFR 58 (L) 12/05/2021   Patient is on Vitamin D supplement.   Lab Results  Component Value Date   VD25OH 101 (H) 12/05/2021     He has a hx of GERD which is controlled with Pantoprazole and lifestyle modifications.  He follows with Dr. Tomasa Rand.  He is UTD w/colonoscopy.    Medication Review:  Current Outpatient Medications (Endocrine & Metabolic):    testosterone cypionate (DEPOTESTOSTERONE CYPIONATE) 200 MG/ML injection, INJECT 1 MLS INTRAMUSCULARLY EVERY WEEK  Current Outpatient Medications (Cardiovascular):    atenolol (TENORMIN) 100 MG tablet, TAKE 1 TABLET BY MOUTH DAILY FOR BLOOD PRESSURE   atorvastatin (LIPITOR) 40 MG tablet, TAKE 1 TABLET BY MOUTH DAILY FOR CHOLESTEROL   diltiazem (CARDIZEM CD) 240 MG 24 hr capsule, TAKE 1 CAPSULE BY MOUTH DAILY FOR BLOOD PRESSURE   doxazosin (CARDURA) 8 MG tablet, TAKE 1 TABLET BY MOUTH AT BEDTIME FOR BLOOD PRESSURE OR PROSTATE   furosemide (LASIX) 80 MG tablet, TAKE 1 TABLET BY MOUTH EVERY DAY FOR BLOOD PRESSURE AND FLUID RETENTION OR ANKLE SWELLING   nitroGLYCERIN (NITROSTAT) 0.4 MG SL tablet, Dissolve 1 tablet under tongue every 3 to 5 minutes  as needed for Chest Pain ( Do NOT take with Cialis )   olmesartan (BENICAR) 40 MG tablet, Take 1/2  to 1 tablet Daily for BP   tadalafil (CIALIS) 20 MG tablet, Take 1/2 to 1 tablet every 2 to 3 days as needed for XXXX   Current Outpatient Medications  (Analgesics):    acetaminophen (TYLENOL) 325 MG tablet, Take 2 tablets (650 mg total) by mouth every 4 (four) hours as needed for mild pain ((score 1 to 3) or temp > 100.5).   ASPIRIN 81 PO, Take 81 mg by mouth daily.    meloxicam (MOBIC) 15 MG tablet, TAKE 1/2 TO 1 TABLET BY MOUTH EVERY DAY WITH FOOD FOR PAIN AND INFLAMMATION AND LIMIT TO 4-5 DAYS/WEEK TO AVOID KIDNEY DAMAGE  Current Outpatient Medications (Hematological):    clopidogrel (PLAVIX) 75 MG tablet, Take 1 tablet Daily to prevent Blood Clots  Current Outpatient Medications (Other):    ALPRAZolam (XANAX) 0.5 MG tablet, Take  1/2-1 tablet  ONLY  if needed for Sleep &  limit to 5 days /week to avoid Addiction & Dementia   Ascorbic Acid (VITAMIN C PO), Take 1 tablet by mouth daily as needed (for immune system support).   buPROPion (WELLBUTRIN XL) 300 MG 24 hr tablet, TAKE 1 TABLET BY MOUTH EVERY MORNING FOR MOOD OR FOCUS OR CONCENTRATION   COVID-19 mRNA bivalent vaccine, Pfizer, (PFIZER COVID-19 VAC BIVALENT) injection, Inject into the muscle.   cyclobenzaprine (FLEXERIL) 10 MG tablet, Take 1/2 to 1 tablet 3 x /day as needed for Muscle Spasm   diclofenac sodium (VOLTAREN) 1 % GEL, Apply 2 to 4 grams 2 to 4 x / day   Magnesium 400 MG CAPS, Take 2 capsules by mouth daily.   Multiple Vitamin (MULTIVITAMIN WITH MINERALS) TABS, Take 2 tablets by mouth daily. Mega Men Multivitamin.   pantoprazole (PROTONIX) 40 MG tablet, TAKE 1 TABLET BY MOUTH DAILY FOR STOMACH ACID OR INDIGESTION OR HEART BURN   tamsulosin (FLOMAX) 0.4 MG CAPS capsule, TAKE 1 CAPSULE BY MOUTH EVERY NIGHT AT BEDTIME FOR PROSTATE/URINE FLOW  Allergies: Allergies  Allergen Reactions   Hctz [Hydrochlorothiazide]    Tape Rash    Clear tape    Current Problems (verified) has Prolapsed external hemorrhoids; Hemorrhoids, internal, with bleeding; NSVT (nonsustained ventricular tachycardia) (HCC); Atrial flutter, paroxysmal (HCC); CAD (coronary artery disease), native coronary  artery; Stented coronary artery; Essential hypertension; GERD (gastroesophageal reflux disease); Diverticulosis; Other abnormal glucose (hx of prediabetes); Vitamin D deficiency; Medication management; Testosterone deficiency; Drug-induced erectile dysfunction; Primary osteoarthritis of right knee; Benign hypertensive heart and CKD, stage 3 (GFR 30-59), w CHF (HCC); Obesity (BMI 30.0-34.9); and Spinal stenosis of lumbar region on their problem list.  Screening Tests Immunization History  Administered Date(s) Administered   Influenza Split 12/16/2013, 12/02/2014   Influenza Whole 12/09/2012   Influenza, Seasonal, Injecte, Preservative Fre 12/01/2015   Influenza-Unspecified 11/19/2016, 11/30/2017   PFIZER Comirnaty(Gray Top)Covid-19 Tri-Sucrose Vaccine 07/26/2020   PFIZER(Purple Top)SARS-COV-2 Vaccination 05/21/2019, 06/12/2019   PPD Test 11/05/2013, 01/04/2015, 02/10/2016, 03/21/2017, 04/16/2018   Pfizer Covid-19 Vaccine Bivalent Booster 64yrs & up 12/02/2020   Td 02/19/2005   Tdap 03/01/2015   Health Maintenance  Topic Date Due   Zoster Vaccines- Shingrix (1 of 2) Never done   Pneumonia Vaccine 10+ Years old (1 - PCV) Never done   INFLUENZA VACCINE  09/19/2021   COVID-19 Vaccine (5 - 2023-24 season) 10/20/2021   DTaP/Tdap/Td (3 - Td or Tdap) 02/28/2025   COLONOSCOPY (Pts 45-68yrs Insurance coverage  will need to be confirmed)  11/16/2030   Hepatitis C Screening  Completed   HPV VACCINES  Aged Out      Names of Other Physician/Practitioners you currently use: 1. [facility] here for primary care 2. Dr. Prudy Feeler, eye doctor, last visit 03/2021 3. Dr. Imagene Riches , dentist, last visit 04/2021 4. None, derm, last visit - Due for annual skin check.   Patient Care Team: Lucky Cowboy, MD as PCP - General (Internal Medicine) Runell Gess, MD as PCP - Cardiology (Cardiology) Louis Meckel, MD (Inactive) as Consulting Physician  (Gastroenterology)  Surgical: He  has a past surgical history that includes Appendectomy; Knee arthroscopy; Rotator cuff repair; Hand surgery; Tonsillectomy; Knee arthroscopy (Right, 04/21/2012); Coronary angioplasty with stent (2012); left heart catheterization with coronary angiogram (N/A, 01/03/2011); percutaneous coronary stent intervention (pci-s) (N/A, 01/03/2011); Colonoscopy; Triceps tendon repair (Bilateral); Total knee arthroplasty (Right, 04/15/2017); Total knee arthroplasty (Right, 04/15/2017); Carpal tunnel release (Right, 2021); and Elbow surgery (Right, 2021). Family His family history includes Coronary artery disease in his father; Diabetes in his brother and mother; HIV/AIDS in his brother; Heart attack in his mother; Heart disease in his father and mother; Hypertension in his brother; Obesity in his brother. Social history  He reports that he has never smoked. He has never used smokeless tobacco. He reports current alcohol use of about 10.0 standard drinks of alcohol per week. He reports that he does not use drugs.  Objective:   There were no vitals filed for this visit.  There is no height or weight on file to calculate BMI.  General appearance: alert, no distress, WD/WN, male HEENT: normocephalic, sclerae anicteric, TMs pearly, nares patent, no discharge or erythema, pharynx normal Oral cavity: MMM, no lesions Neck: supple, no lymphadenopathy, no thyromegaly, no masses Heart: RRR, normal S1, S2, no murmurs Lungs: CTA bilaterally, no wheezes, rhonchi, or rales Abdomen: +bs, soft, non tender, non distended, no masses, no hepatomegaly, no splenomegaly Musculoskeletal: nontender, no swelling, no obvious deformity Extremities: no edema, no cyanosis, no clubbing Pulses: 2+ symmetric, upper and lower extremities, normal cap refill Neurological: alert, oriented x 3, CN2-12 intact, strength normal upper extremities and lower extremities, sensation normal throughout, DTRs 2+  throughout, no cerebellar signs, gait normal Psychiatric: normal affect, behavior normal, pleasant   Tibor Lemmons, NP   03/14/2022

## 2022-03-15 ENCOUNTER — Ambulatory Visit (INDEPENDENT_AMBULATORY_CARE_PROVIDER_SITE_OTHER): Payer: 59 | Admitting: Nurse Practitioner

## 2022-03-15 ENCOUNTER — Encounter: Payer: Self-pay | Admitting: Nurse Practitioner

## 2022-03-15 VITALS — BP 128/88 | HR 71 | Temp 98.2°F | Ht 66.5 in | Wt 188.0 lb

## 2022-03-15 DIAGNOSIS — K5790 Diverticulosis of intestine, part unspecified, without perforation or abscess without bleeding: Secondary | ICD-10-CM

## 2022-03-15 DIAGNOSIS — I2510 Atherosclerotic heart disease of native coronary artery without angina pectoris: Secondary | ICD-10-CM

## 2022-03-15 DIAGNOSIS — I4729 Other ventricular tachycardia: Secondary | ICD-10-CM

## 2022-03-15 DIAGNOSIS — I10 Essential (primary) hypertension: Secondary | ICD-10-CM

## 2022-03-15 DIAGNOSIS — I4892 Unspecified atrial flutter: Secondary | ICD-10-CM

## 2022-03-15 DIAGNOSIS — M48061 Spinal stenosis, lumbar region without neurogenic claudication: Secondary | ICD-10-CM

## 2022-03-15 DIAGNOSIS — E782 Mixed hyperlipidemia: Secondary | ICD-10-CM

## 2022-03-15 DIAGNOSIS — Z79899 Other long term (current) drug therapy: Secondary | ICD-10-CM

## 2022-03-15 DIAGNOSIS — N183 Chronic kidney disease, stage 3 unspecified: Secondary | ICD-10-CM

## 2022-03-15 DIAGNOSIS — K219 Gastro-esophageal reflux disease without esophagitis: Secondary | ICD-10-CM

## 2022-03-15 DIAGNOSIS — R7309 Other abnormal glucose: Secondary | ICD-10-CM

## 2022-03-15 DIAGNOSIS — E349 Endocrine disorder, unspecified: Secondary | ICD-10-CM

## 2022-03-15 DIAGNOSIS — E559 Vitamin D deficiency, unspecified: Secondary | ICD-10-CM

## 2022-03-15 DIAGNOSIS — I130 Hypertensive heart and chronic kidney disease with heart failure and stage 1 through stage 4 chronic kidney disease, or unspecified chronic kidney disease: Secondary | ICD-10-CM

## 2022-03-15 MED ORDER — OLMESARTAN MEDOXOMIL 40 MG PO TABS: ORAL_TABLET | ORAL | 3 refills | Status: AC

## 2022-03-15 NOTE — Patient Instructions (Signed)

## 2022-03-16 LAB — COMPLETE METABOLIC PANEL WITH GFR
AG Ratio: 1.7 (calc) (ref 1.0–2.5)
ALT: 34 U/L (ref 9–46)
AST: 43 U/L — ABNORMAL HIGH (ref 10–35)
Albumin: 4.5 g/dL (ref 3.6–5.1)
Alkaline phosphatase (APISO): 58 U/L (ref 35–144)
BUN/Creatinine Ratio: 14 (calc) (ref 6–22)
BUN: 20 mg/dL (ref 7–25)
CO2: 24 mmol/L (ref 20–32)
Calcium: 10 mg/dL (ref 8.6–10.3)
Chloride: 101 mmol/L (ref 98–110)
Creat: 1.4 mg/dL — ABNORMAL HIGH (ref 0.70–1.35)
Globulin: 2.7 g/dL (calc) (ref 1.9–3.7)
Glucose, Bld: 90 mg/dL (ref 65–99)
Potassium: 4.3 mmol/L (ref 3.5–5.3)
Sodium: 135 mmol/L (ref 135–146)
Total Bilirubin: 0.9 mg/dL (ref 0.2–1.2)
Total Protein: 7.2 g/dL (ref 6.1–8.1)
eGFR: 55 mL/min/{1.73_m2} — ABNORMAL LOW (ref >=60)

## 2022-03-16 LAB — CBC WITH DIFFERENTIAL/PLATELET
Absolute Monocytes: 825 cells/uL (ref 200–950)
Basophils Absolute: 22 cells/uL (ref 0–200)
Basophils Relative: 0.4 %
Eosinophils Absolute: 204 cells/uL (ref 15–500)
Eosinophils Relative: 3.7 %
HCT: 43 % (ref 38.5–50.0)
Hemoglobin: 14.7 g/dL (ref 13.2–17.1)
Lymphs Abs: 1463 cells/uL (ref 850–3900)
MCH: 34.2 pg — ABNORMAL HIGH (ref 27.0–33.0)
MCHC: 34.2 g/dL (ref 32.0–36.0)
MCV: 100 fL (ref 80.0–100.0)
MPV: 9.9 fL (ref 7.5–12.5)
Monocytes Relative: 15 %
Neutro Abs: 2987 cells/uL (ref 1500–7800)
Neutrophils Relative %: 54.3 %
Platelets: 168 10*3/uL (ref 140–400)
RBC: 4.3 10*6/uL (ref 4.20–5.80)
RDW: 12.6 % (ref 11.0–15.0)
Total Lymphocyte: 26.6 %
WBC: 5.5 10*3/uL (ref 3.8–10.8)

## 2022-03-16 LAB — HEMOGLOBIN A1C
Hgb A1c MFr Bld: 5.4 % of total Hgb (ref <5.7)
Mean Plasma Glucose: 108 mg/dL
eAG (mmol/L): 6 mmol/L

## 2022-03-16 LAB — LIPID PANEL
Cholesterol: 126 mg/dL (ref <200)
HDL: 62 mg/dL (ref >=40)
LDL Cholesterol (Calc): 50 mg/dL (calc)
Non-HDL Cholesterol (Calc): 64 mg/dL (calc) (ref <130)
Total CHOL/HDL Ratio: 2 (calc) (ref <5.0)
Triglycerides: 61 mg/dL (ref <150)

## 2022-03-16 LAB — TSH: TSH: 0.88 mIU/L (ref 0.40–4.50)

## 2022-03-16 LAB — VITAMIN D 25 HYDROXY (VIT D DEFICIENCY, FRACTURES): Vit D, 25-Hydroxy: 104 ng/mL — ABNORMAL HIGH (ref 30–100)

## 2022-04-01 ENCOUNTER — Other Ambulatory Visit: Payer: Self-pay | Admitting: Internal Medicine

## 2022-04-01 DIAGNOSIS — E782 Mixed hyperlipidemia: Secondary | ICD-10-CM

## 2022-04-01 MED ORDER — ATORVASTATIN CALCIUM 40 MG PO TABS: ORAL_TABLET | ORAL | 3 refills | Status: DC

## 2022-04-16 ENCOUNTER — Encounter: Payer: Self-pay | Admitting: Internal Medicine

## 2022-04-16 ENCOUNTER — Other Ambulatory Visit: Payer: Self-pay | Admitting: Internal Medicine

## 2022-04-16 DIAGNOSIS — E349 Endocrine disorder, unspecified: Secondary | ICD-10-CM

## 2022-04-16 MED ORDER — TESTOSTERONE CYPIONATE 200 MG/ML IM SOLN: INTRAMUSCULAR | 2 refills | Status: DC

## 2022-05-09 ENCOUNTER — Encounter: Payer: Self-pay | Admitting: Internal Medicine

## 2022-05-14 ENCOUNTER — Other Ambulatory Visit: Payer: Self-pay | Admitting: Nurse Practitioner

## 2022-05-14 ENCOUNTER — Encounter: Payer: Self-pay | Admitting: Nurse Practitioner

## 2022-05-14 DIAGNOSIS — F419 Anxiety disorder, unspecified: Secondary | ICD-10-CM

## 2022-05-15 MED ORDER — ALPRAZOLAM 0.5 MG PO TABS: ORAL_TABLET | ORAL | 0 refills | Status: DC

## 2022-05-23 ENCOUNTER — Encounter: Payer: Self-pay | Admitting: Internal Medicine

## 2022-05-23 ENCOUNTER — Other Ambulatory Visit: Payer: Self-pay | Admitting: Nurse Practitioner

## 2022-05-23 DIAGNOSIS — N401 Enlarged prostate with lower urinary tract symptoms: Secondary | ICD-10-CM

## 2022-05-23 MED ORDER — TAMSULOSIN HCL 0.4 MG PO CAPS: ORAL_CAPSULE | ORAL | 1 refills | Status: DC

## 2022-05-27 NOTE — Progress Notes (Unsigned)
Future Appointments  Date Time Provider Department  05/28/2022 11:30 AM Lucky Cowboy, MD [facility]  07/04/2022 10:00 AM Lucky Cowboy, MD [facility]    History of Present Illness:      The patient is a  very nice 67 y.o. MWM  with HTN, HLD, Pre-Diabetes and Vitamin D Deficiency presenting with c/o pain in his left testicle.   Denies injury.    Current Outpatient Medications  Medication Instructions   acetaminophen  650 mg Every 4 hours PRN   ALPRAZolam  0.5 MG tablet Take  1/2-1 tablet  ONLY  if needed   VITAMIN C  1 tablet Daily PRN   ASPIRIN 81  Daily   atenolol 100 MG tablet TAKE 1 TABLET DAILY    atorvastatin 40 MG tablet Take  1 tablet  Daily    buPROPion  XL 300 MG 24 hr tablet TAKE 1 TABLET  EVERY MORNING    clopidogrel  75 MG tablet Take 1 tablet Daily to prevent Blood Clots   cyclobenzaprine  10 MG tablet Take 1/2 to 1 tablet 3 x /day as needed    diclofenac  1 % GEL Apply 2 to 4 grams 2 to 4 x / day   diltiazem CD 240 MG  TAKE 1 CAPSULE DAILY    Doxazosin  8 MG tablet TAKE 1 TABLET AT BEDTIME   furosemide  80 MG tablet TAKE 1 TABLET  EVERY DAY    Magnesium 400 MG CAPS 2 capsules  Daily   meloxicam  15 MG tablet TAKE 1/2 TO 1 TABLET EVERY DAY   Mega Men Multivitamin 2 tablets Daily,.   nitroGLYCERIN  0.4 MG SL tablet as needed for Chest Pain    olmesartan  40 MG tablet Take 1/2  to 1 tablet Daily for BP   pantoprazole 40 MG tablet TAKE 1 TABLET  DAILY   tadalafis 20 MG tablet Take 1/2 to 1 tablet every 2 to 3 days as needed    tamsulosin  0.4 MG CAPS capsule TAKE 1 CAPSULE  AT BEDTIME    testosterone cypio 200 MG/ML injection INJECT 1 ML   IM EVERY WEEK     Allergies  Allergen Reactions   Hctz [Hydrochlorothiazide]    Tape Rash    Clear tape     Problem list He has Prolapsed external hemorrhoids; Hemorrhoids, internal, with bleeding; NSVT (nonsustained ventricular tachycardia) (HCC); Atrial flutter, paroxysmal (HCC); CAD (coronary artery disease),  native coronary artery; Stented coronary artery; Essential hypertension; GERD (gastroesophageal reflux disease); Diverticulosis; Other abnormal glucose (hx of prediabetes); Vitamin D deficiency; Medication management; Testosterone deficiency; Drug-induced erectile dysfunction; Primary osteoarthritis of right knee; Benign hypertensive heart and CKD, stage 3 (GFR 30-59), w CHF; Obesity (BMI 30.0-34.9); and Spinal stenosis of lumbar region on their problem list.   Observations/Objective:  BP 130/80   Pulse 66   Temp 97.9 F (36.6 C)   Resp 16   Ht 5' 6.5" (1.689 m)   Wt 187 lb 6.4 oz (85 kg)   SpO2 97%   BMI 29.79 kg/m   GU focused exam finds no Inguinal Hernia. Rt testes atrophic & Rt  epididymis /cords are Normal . Left testes atrophic with a tender cyst in the Lt mid epididymis. Lt cords are normal.    Assessment and Plan:   1. Left epididymitis  - doxycyclin 100 MG capsule;  Take 1 capsule 2 x /day with meals for Infection   Dispense: 60 capsule; Refill: 0   Follow  Up Instructions:        I discussed the assessment and treatment plan with the patient. The patient was provided an opportunity to ask questions and all were answered. The patient agreed with the plan and demonstrated an understanding of the instructions.       The patient was advised to call back or seek an in-person evaluation if the symptoms worsen or if the condition fails to improve as anticipated.    Kirtland Bouchard, MD

## 2022-05-28 ENCOUNTER — Encounter: Payer: Self-pay | Admitting: Internal Medicine

## 2022-05-28 ENCOUNTER — Ambulatory Visit (INDEPENDENT_AMBULATORY_CARE_PROVIDER_SITE_OTHER): Payer: 59 | Admitting: Internal Medicine

## 2022-05-28 VITALS — BP 130/80 | HR 66 | Temp 97.9°F | Resp 16 | Ht 66.5 in | Wt 187.4 lb

## 2022-05-28 DIAGNOSIS — N451 Epididymitis: Secondary | ICD-10-CM | POA: Diagnosis not present

## 2022-05-28 MED ORDER — DOXYCYCLINE HYCLATE 100 MG PO CAPS: ORAL_CAPSULE | ORAL | 0 refills | Status: DC

## 2022-05-28 NOTE — Patient Instructions (Signed)
Epididymitis  Epididymitis is inflammation or swelling of the epididymis. This is caused by an infection. The epididymis is a cord-like structure that is located along the top and back part of the testicle. It collects and stores sperm from the testicle. This condition can also cause pain and swelling of the testicle and scrotum. Symptoms usually start suddenly (acute epididymitis). Sometimes epididymitis starts gradually and lasts for a while (chronic epididymitis). Chronic epididymitis may be harder to treat. What are the causes? In men ages 20-40, this condition is usually caused by a bacterial infection or a sexually transmitted infection (STI), such as gonorrhea or chlamydia. In men 40 and older, this condition is usually caused by bacteria from a urinary blockage or from abnormalities in the urinary system. These can result from: Having a tube placed into the bladder (urinary catheter). Having an enlarged or inflamed prostate gland. Having recently had urinary tract surgery. Having a problem with a backward flow of urine (retrograde). In men who have a condition that weakens the body's defense system (immune system), such as human immunodeficiency virus (HIV), this condition can be caused by: Other bacteria, including tuberculosis and syphilis. Viruses. Fungi. Sometimes this condition occurs without infection. This may happen because of trauma or repetitive activities such as sports. What increases the risk? You are more likely to develop this condition if you have: Unprotected sex with more than one partner. Anal sex. Had recent surgery. A urinary catheter. Urinary problems. A suppressed immune system. What are the signs or symptoms? This condition usually begins suddenly with chills, fever, and pain behind the scrotum and in the testicle. Other symptoms include: Swelling of the scrotum, testicle, or both. Pain when ejaculating or urinating. Pain in the back or  abdomen. Nausea. Itching and discharge from the penis. A frequent need to pass urine. Redness, increased warmth, and tenderness of the scrotum. How is this diagnosed? Your health care provider can diagnose this condition based on your symptoms and medical history. Your health care provider will also do a physical exam to check your scrotum and testicle for swelling, pain, and redness. You may also have other tests, including: Testing of discharge from the penis. Testing your urine for infections, such as STIs. Ultrasound to check for blood flow and inflammation. Your health care provider may test you for other STIs, including HIV. How is this treated? Treatment for this condition depends on the cause. If your condition is caused by a bacterial infection, oral antibiotic medicine may be prescribed. If the bacterial infection has spread to your blood, you may need to receive IV antibiotics. For both bacterial and nonbacterial epididymitis, you may be treated with: Rest. Elevation of the scrotum. Pain medicines. Anti-inflammatory medicines. Surgery may be needed if: You have pus buildup in the scrotum (abscess). You have epididymitis that has not responded to other treatments. Follow these instructions at home: Medicines Take over-the-counter and prescription medicines only as told by your health care provider. If you were prescribed an antibiotic medicine, take it as told by your health care provider. Do not stop taking the antibiotic even if your condition improves. Sexual activity If your epididymitis was caused by an STI, avoid sexual activity until your treatment is complete. Inform your sexual partner or partners if you test positive for an STI. They may need to be treated. Do not engage in sexual activity with your partner or partners until their treatment is completed. Managing pain and swelling  If directed, raise (elevate) your scrotum and apply ice.   To do this: Put ice in a  plastic bag. Place a small towel or pillow between your legs. Rest your scrotum on the pillow or towel. Place another towel between your skin and the plastic bag. Leave the ice on for 20 minutes, 2-3 times a day. Remove the ice if your skin turns bright red. This is very important. If you cannot feel pain, heat, or cold, you have a greater risk of damage to the area. Keep your scrotum elevated and supported while resting. Ask your health care provider if you should wear a scrotal support, such as a jockstrap. Wear it as told by your health care provider. Try taking a sitz bath to help with discomfort. This is a warm water bath that is taken while you are sitting down. The water should come up to your hips and should cover your buttocks. Do this 3-4 times per day or as told by your health care provider. General instructions Drink enough fluid to keep your urine pale yellow. Return to your normal activities as told by your health care provider. Ask your health care provider what activities are safe for you. Keep all follow-up visits. This is important. Contact a health care provider if: You have a fever. Your pain medicine is not helping. Your pain is getting worse. Your symptoms do not improve within 3 days. Summary Epididymitis is inflammation or swelling of the epididymis. This is caused by an infection. This condition can also cause pain and swelling of the testicle and scrotum. Treatment for this condition depends on the cause. If your condition is caused by a bacterial infection, oral antibiotic medicine may be prescribed. Inform your sexual partner or partners if you test positive for an STI. They may need to be treated. Do not engage in sexual activity with your partner or partners until their treatment is completed. Contact a health care provider if your symptoms do not improve within 3 days. This information is not intended to replace advice given to you by your health care provider.  Make sure you discuss any questions you have with your health care provider. Document Revised: 09/14/2020 Document Reviewed: 09/14/2020 Elsevier Patient Education  2023 [employer]  

## 2022-05-29 ENCOUNTER — Encounter: Payer: 59 | Admitting: Internal Medicine

## 2022-06-17 ENCOUNTER — Other Ambulatory Visit: Payer: Self-pay | Admitting: Internal Medicine

## 2022-06-17 DIAGNOSIS — I10 Essential (primary) hypertension: Secondary | ICD-10-CM

## 2022-06-17 MED ORDER — DILTIAZEM HCL ER COATED BEADS 240 MG PO CP24: ORAL_CAPSULE | ORAL | 3 refills | Status: DC

## 2022-06-20 ENCOUNTER — Other Ambulatory Visit: Payer: Self-pay | Admitting: Internal Medicine

## 2022-06-20 DIAGNOSIS — I10 Essential (primary) hypertension: Secondary | ICD-10-CM

## 2022-06-20 MED ORDER — DILTIAZEM HCL ER 240 MG PO TB24: ORAL_TABLET | ORAL | 3 refills | Status: DC

## 2022-07-02 ENCOUNTER — Other Ambulatory Visit: Payer: Self-pay | Admitting: Internal Medicine

## 2022-07-02 DIAGNOSIS — K219 Gastro-esophageal reflux disease without esophagitis: Secondary | ICD-10-CM

## 2022-07-02 DIAGNOSIS — I10 Essential (primary) hypertension: Secondary | ICD-10-CM

## 2022-07-02 DIAGNOSIS — F341 Dysthymic disorder: Secondary | ICD-10-CM

## 2022-07-02 MED ORDER — ATENOLOL 100 MG PO TABS: ORAL_TABLET | ORAL | 3 refills | Status: DC

## 2022-07-02 MED ORDER — PANTOPRAZOLE SODIUM 40 MG PO TBEC: DELAYED_RELEASE_TABLET | ORAL | 3 refills | Status: AC

## 2022-07-02 MED ORDER — BUPROPION HCL ER (XL) 300 MG PO TB24: ORAL_TABLET | ORAL | 3 refills | Status: DC

## 2022-07-04 ENCOUNTER — Ambulatory Visit: Payer: 59 | Admitting: Internal Medicine

## 2022-07-04 ENCOUNTER — Encounter: Payer: Self-pay | Admitting: Internal Medicine

## 2022-07-04 VITALS — BP 138/86 | HR 91 | Temp 97.9°F | Resp 16 | Ht 66.5 in | Wt 184.8 lb

## 2022-07-04 DIAGNOSIS — E559 Vitamin D deficiency, unspecified: Secondary | ICD-10-CM

## 2022-07-04 DIAGNOSIS — Z136 Encounter for screening for cardiovascular disorders: Secondary | ICD-10-CM | POA: Diagnosis not present

## 2022-07-04 DIAGNOSIS — E782 Mixed hyperlipidemia: Secondary | ICD-10-CM

## 2022-07-04 DIAGNOSIS — I10 Essential (primary) hypertension: Secondary | ICD-10-CM

## 2022-07-04 DIAGNOSIS — Z1211 Encounter for screening for malignant neoplasm of colon: Secondary | ICD-10-CM

## 2022-07-04 DIAGNOSIS — E349 Endocrine disorder, unspecified: Secondary | ICD-10-CM

## 2022-07-04 DIAGNOSIS — R7309 Other abnormal glucose: Secondary | ICD-10-CM

## 2022-07-04 DIAGNOSIS — Z0001 Encounter for general adult medical examination with abnormal findings: Secondary | ICD-10-CM

## 2022-07-04 DIAGNOSIS — Z0000 Encounter for general adult medical examination without abnormal findings: Secondary | ICD-10-CM

## 2022-07-04 DIAGNOSIS — N1831 Chronic kidney disease, stage 3a: Secondary | ICD-10-CM

## 2022-07-04 DIAGNOSIS — Z8249 Family history of ischemic heart disease and other diseases of the circulatory system: Secondary | ICD-10-CM

## 2022-07-04 DIAGNOSIS — Z125 Encounter for screening for malignant neoplasm of prostate: Secondary | ICD-10-CM

## 2022-07-04 DIAGNOSIS — N138 Other obstructive and reflux uropathy: Secondary | ICD-10-CM

## 2022-07-04 DIAGNOSIS — I2510 Atherosclerotic heart disease of native coronary artery without angina pectoris: Secondary | ICD-10-CM

## 2022-07-04 DIAGNOSIS — Z79899 Other long term (current) drug therapy: Secondary | ICD-10-CM

## 2022-07-04 LAB — CBC WITH DIFFERENTIAL/PLATELET
Basophils Absolute: 17 cells/uL (ref 0–200)
Basophils Relative: 0.3 %
Eosinophils Absolute: 93 cells/uL (ref 15–500)
Eosinophils Relative: 1.6 %
HCT: 43.4 % (ref 38.5–50.0)
Hemoglobin: 14.7 g/dL (ref 13.2–17.1)
MCH: 33.9 pg — ABNORMAL HIGH (ref 27.0–33.0)
MCHC: 33.9 g/dL (ref 32.0–36.0)
Neutro Abs: 3405 cells/uL (ref 1500–7800)
WBC: 5.8 10*3/uL (ref 3.8–10.8)

## 2022-07-04 NOTE — Patient Instructions (Signed)

## 2022-07-04 NOTE — Progress Notes (Unsigned)
Annual  Screening/Preventative Visit  & Comprehensive Evaluation & Examination   Future Appointments  Date Time Provider Department  07/04/2022 10:00 AM Lucky Cowboy, MD [facility]  07/11/2023 11:00 AM Lucky Cowboy, MD [facility]            This very nice 67 y.o. MWM presents for a Screening /Preventative Visit & comprehensive evaluation and management of multiple medical co-morbidities.  Patient has been followed for HTN, HLD, Prediabetes, Testosterone Deficiency  and Vitamin D Deficiency. Patient has GERD controlled on his Pantoprazole.         HTN predates since 1989. Patient's BP has been controlled at home.   In 2009, patient hasd a Negative /Normal Heart Cath.  In 2012, he presented with ACS  & had emergency PCA/ DES . Patient has done well since w/o suspect CP. He does have CKD3a (GFR 57) attributed to HTVD . Today's BP is at goal -  136/86 . Patient denies any cardiac symptoms as chest pain, palpitations, shortness of breath, dizziness or ankle swelling.        Patient's hyperlipidemia is controlled with diet and Atorvastatin. Patient denies myalgias or other medication SE's. Last lipids were at goal :  Lab Results  Component Value Date   CHOL 126 03/15/2022   HDL 62 03/15/2022   LDLCALC 50 03/15/2022   TRIG 61 03/15/2022   CHOLHDL 2.0 03/15/2022         Patient has hx/o prediabetes (A1c 5.8% /2008) and patient denies reactive hypoglycemic symptoms, visual blurring, diabetic polys or paresthesias. Last A1c was normal & at goal :   Lab Results  Component Value Date   HGBA1C 5.4 03/15/2022                                           Patient Low Testosterone  Deficiency ("308" with Nl range 350-890 in 2009 ) and has been on parenteral replacement  administering his own injections. Patient reports improved sense of well-being on treatment.         Finally, patient has history of Vitamin D Deficiency  ("19" /2009) and last vitamin D was sl elevated & dose was  decreased :   Lab Results  Component Value Date   VD25OH 104 (H) 03/15/2022       Current Outpatient Medications on File Prior to Visit  Medication Sig   acetaminophen 325 MG  Take 2 tablets \\every  4 hours as needed   ALPRAZolam 0.5 MG  Take  1/2-1 tablet  ONLY  if needed f   VITAMIN C  Take 1 tablet daily    ASPIRIN 81  Take  daily.    atenolol (100 MG  Take  1 tablet  Daily  for BP   atorvastatin 40 MG  TAKE 1 TABLET DAILY   buPROPion - XL 300 MG 2 Take 1 tablet every Morning    clopidogrel (PLAVIX) 75 MG  Take 1 tablet Daily to prevent Blood Clots   cyclobenzaprine 10 MG  Take 1/2 to 1 tablet 3 x /day as needed  Muscle Spasm   diclofenac 1 % GEL Apply 2 to 4 grams 2 to 4 x / day   diltiazem  CD 240 MG 24 hr  Take 1 tablet  Daily    doxazosin  8 MG  Take  1 tablet   at Bedtime     furosemide  80 MG  TAKE 1 TABLET  EVERY DAY    Magnesium 400 MG  Take 2 capsules by mouth daily.   meloxicam  15 MG  TAKE 1/2 TO 1 TABLET EVERY DAY    Mega Men Multivitamin Take 2 tablets daily.    NITROSTAT 0.4 MG SL  as needed for Chest Pain   olmesartan  40 MG  Take 1/2  to 1 tablet Daily for BP   pantoprazole  40 MG TAKE 1 TABLET DAILY    tadalafil (CIALIS) 20 MG  Take 1/2 to 1 tablet every 2 to 3 days as needed    tamsulosin  0.4 MG CAPS  TAKE 1 CAPSULE  AT BEDTIME    testosterone cypio 200 MG/ML inj INJECT 1 MLS IM EVERY WEEK   traZODone  150 MG tablet Take  1/2 to 1 tablet  1 hour  before Bedtime as needed for Sleep     Allergies  Allergen Reactions   Hctz [Hydrochlorothiazide]    Tape Rash    Clear tape     Past Medical History:  Diagnosis Date   Anxiety    Atrial flutter, paroxysmal (HCC) 01/04/2011   CAD (coronary artery disease), native coronary artery 01/04/2011   Diverticulitis    Diverticulosis    GERD (gastroesophageal reflux disease)    History of meniscectomy of right knee 01/02/2011   Hypertension    NSVT (nonsustained ventricular tachycardia) 01/02/2011   Primary  osteoarthritis of right knee 04/03/2017   Stented coronary artery 01/04/2011   LAD DES Resolute.     Health Maintenance  Topic Date Due   Zoster Vaccines- Shingrix (1 of 2) Never done   Pneumonia Vaccine 53+ Years old (1 - PCV) Never done   INFLUENZA VACCINE  09/19/2021   TETANUS/TDAP  02/28/2025   COVID-19 Vaccine  Completed   Hepatitis C Screening  Completed   HPV VACCINES  Aged Out     Immunization History  Administered Date(s) Administered   Influenza  12/16/2013, 12/02/2014   Influenza  12/09/2012   Influenza 12/01/2015   Influenza 11/19/2016, 11/30/2017   PFIZER Covid-19 Vaccine 07/26/2020   PFIZER SARS-COV-2 Vacc 05/21/2019, 06/12/2019   PPD Test 02/10/2016, 03/21/2017, 04/16/2018   Pfizer Covid-19 Bivalent Booster  12/02/2020   Td 02/19/2005   Tdap 03/01/2015    Last Colon - 11/15/2020  - Dr Tiajuana Amass Recc 10 year f/u due Oct 2032.    Past Surgical History:  Procedure Laterality Date   APPENDECTOMY     CARPAL TUNNEL RELEASE Right 2021   Dr. Janee Morn   COLONOSCOPY     CORONARY ANGIOPLASTY WITH STENT PLACEMENT  2012   ELBOW SURGERY Right 2021   Cyst excision/nerve decompression, Dr. Janee Morn   HAND SURGERY     right   KNEE ARTHROSCOPY     x 8 left   KNEE ARTHROSCOPY Right 04/21/2012    ARTHROSCOPY KNEE;  Nilda Simmer, MD Right;  medial and lateral meniscectomies   LEFT HEART CATH WITH COR  ANGIOGRAM N/A 01/03/2011   LEFT HEART CATHETERIZATION WITH CORONARY ANGIOGRAM;  Runell Gess, MD   PERCUTANEOUS CORONARY STENT INTERVENTION (PCI-S) N/A 01/03/2011   PERCUTANEOUS CORONARY STENT INTERVENTION (PCI-S);  Runell Gess, MD   ROTATOR CUFF REPAIR     bilateral   TONSILLECTOMY     TOTAL KNEE ARTHROPLASTY Right 04/15/2017   TOTAL KNEE ARTHROPLASTY Right 04/15/2017   TOTAL KNEE ARTHROPLASTY;   Salvatore Marvel, MD;    TRICEPS TENDON REPAIR Bilateral  Family History  Problem Relation Age of Onset   Diabetes Mother    Heart disease Mother     Heart attack Mother    Heart disease Father    Coronary artery disease Father    Obesity Brother    Diabetes Brother    HIV/AIDS Brother    Hypertension Brother    Colon cancer Neg Hx    Esophageal cancer Neg Hx    Stomach cancer Neg Hx    Rectal cancer Neg Hx      Social History   Tobacco Use   Smoking status: Never   Smokeless tobacco: Never  Vaping Use   Vaping Use: Never used  Substance Use Topics   Alcohol use: Yes    Alcohol/week: 10.0 standard drinks    Types: 10 Cans of beer per week    Comment: 1-2 beers a night   Drug use: No      ROS Constitutional: Denies fever, chills, weight loss/gain, headaches, insomnia,  night sweats or change in appetite. Does c/o fatigue. Eyes: Denies redness, blurred vision, diplopia, discharge, itchy or watery eyes.  ENT: Denies discharge, congestion, post nasal drip, epistaxis, sore throat, earache, hearing loss, dental pain, Tinnitus, Vertigo, Sinus pain or snoring.  Cardio: Denies chest pain, palpitations, irregular heartbeat, syncope, dyspnea, diaphoresis, orthopnea, PND, claudication or edema Respiratory: denies cough, dyspnea, DOE, pleurisy, hoarseness, laryngitis or wheezing.  Gastrointestinal: Denies dysphagia, heartburn, reflux, water brash, pain, cramps, nausea, vomiting, bloating, diarrhea, constipation, hematemesis, melena, hematochezia, jaundice or hemorrhoids Genitourinary: Denies dysuria, frequency, discharge, hematuria or flank pain. Has urgency, nocturia x 1-2 & occasional hesitancy. Musculoskeletal: Denies arthralgia, myalgia, stiffness, Jt. Swelling, pain, limp or strain/sprain. Denies Falls. Skin: Denies puritis, rash, hives, warts, acne, eczema or change in skin lesion Neuro: No weakness, tremor, incoordination, spasms, paresthesia or pain Psychiatric: Denies confusion, memory loss or sensory loss. Denies Depression. Endocrine: Denies change in weight, skin, hair change, nocturia, and paresthesia, diabetic polys,  visual blurring or hyper / hypo glycemic episodes.  Heme/Lymph: No excessive bleeding, bruising or enlarged lymph nodes.   Physical Exam  BP 138/86   Pulse 91   Temp 97.9 F (36.6 C)   Resp 16   Ht 5' 6.5" (1.689 m)   Wt 184 lb 12.8 oz (83.8 kg)   SpO2 98%   BMI 29.38 kg/m   General Appearance: Well nourished and well groomed and in no apparent distress.  Eyes: PERRLA, EOMs, conjunctiva no swelling or erythema, normal fundi and vessels. Sinuses: No frontal/maxillary tenderness ENT/Mouth: EACs patent / TMs  nl. Nares clear without erythema, swelling, mucoid exudates. Oral hygiene is good. No erythema, swelling, or exudate. Tongue normal, non-obstructing. Tonsils not swollen or erythematous. Hearing normal.  Neck: Supple, thyroid not palpable. No bruits, nodes or JVD. Respiratory: Respiratory effort normal.  BS equal and clear bilateral without rales, rhonci, wheezing or stridor. Cardio: Heart sounds are normal with regular rate and rhythm and no murmurs, rubs or gallops. Peripheral pulses are normal and equal bilaterally without edema. No aortic or femoral bruits. Chest: symmetric with normal excursions and percussion.  Abdomen: Soft, with Nl bowel sounds. Nontender, no guarding, rebound, hernias, masses, or organomegaly.  Lymphatics: Non tender without lymphadenopathy.  Musculoskeletal: Full ROM all peripheral extremities, joint stability, 5/5 strength, and normal gait. Skin: Warm and dry without rashes, lesions, cyanosis, clubbing or  ecchymosis.  Neuro: Cranial nerves intact, reflexes equal bilaterally. Normal muscle tone, no cerebellar symptoms. Sensation intact.  Pysch: Alert and oriented X 3 with normal  affect, insight and judgment appropriate.   Assessment and Plan  1. Annual Preventative/Screening Exam    2. Essential hypertension  - EKG 12-Lead - [place], RETROPERITNL ABD,  LTD - Urinalysis, Routine w reflex microscopic - Microalbumin / creatinine urine ratio - CBC with  Differential/Platelet - COMPLETE METABOLIC PANEL WITH GFR - Magnesium - TSH  3. Hyperlipidemia, mixed  - EKG 12-Lead - [place], RETROPERITNL ABD,  LTD - Lipid panel - TSH  4. Abnormal glucose  - EKG 12-Lead - [place], RETROPERITNL ABD,  LTD - Hemoglobin A1c - Insulin, random  5. Vitamin D deficiency  - VITAMIN D 25 Hydroxy   6. Coronary artery disease involving native coronary  artery of native heart without angina pectoris  - EKG 12-Lead - Lipid panel  7. Testosterone deficiency  - Testosterone  8. Gastroesophageal reflux disease  - CBC with Differential/Platelet  9. Chronic renal impairment, stage 3a (HCC)  - Urinalysis, Routine w reflex microscopic - Microalbumin / creatinine urine ratio - PTH, intact and calcium  10. BPH with obstruction/lower urinary tract symptoms  - PSA  11. Prostate cancer screening  - PSA  12. Screening for ischemic heart disease  - EKG 12-Lead  13. FHx: heart disease  - EKG 12-Lead - [place], RETROPERITNL ABD,  LTD  14. Screening for abdominal aortic aneurysm  - [place], RETROPERITNL ABD,  LTD  15. Medication management  - Urinalysis, Routine w reflex microscopic - Microalbumin / creatinine urine ratio - Testosterone - CBC with Differential/Platelet - COMPLETE METABOLIC PANEL WITH GFR - Magnesium - Lipid panel - TSH - Hemoglobin A1c - Insulin, random - VITAMIN D 25 Hydroxy        Patient was counseled in prudent diet, weight control to achieve/maintain BMI less than 25, BP monitoring, regular exercise and medications as discussed.  Discussed med effects and SE's. Routine screening labs and tests as requested with regular follow-up as recommended. Over 40 minutes of exam, counseling, chart review and high complex critical decision making was performed   Marinus Maw, MD.

## 2022-07-05 LAB — COMPLETE METABOLIC PANEL WITH GFR
AG Ratio: 1.7 (calc) (ref 1.0–2.5)
ALT: 40 U/L (ref 9–46)
AST: 50 U/L — ABNORMAL HIGH (ref 10–35)
Albumin: 4.4 g/dL (ref 3.6–5.1)
Alkaline phosphatase (APISO): 43 U/L (ref 35–144)
BUN: 18 mg/dL (ref 7–25)
CO2: 27 mmol/L (ref 20–32)
Calcium: 9.8 mg/dL (ref 8.6–10.3)
Chloride: 102 mmol/L (ref 98–110)
Creat: 1.23 mg/dL (ref 0.70–1.35)
Globulin: 2.6 g/dL (calc) (ref 1.9–3.7)
Glucose, Bld: 94 mg/dL (ref 65–99)
Potassium: 4 mmol/L (ref 3.5–5.3)
Sodium: 138 mmol/L (ref 135–146)
Total Bilirubin: 0.9 mg/dL (ref 0.2–1.2)
Total Protein: 7 g/dL (ref 6.1–8.1)
eGFR: 64 mL/min/{1.73_m2} (ref >=60)

## 2022-07-05 LAB — MICROALBUMIN / CREATININE URINE RATIO
Creatinine, Urine: 69 mg/dL (ref 20–320)
Microalb, Ur: <0.2 mg/dL

## 2022-07-05 LAB — URINALYSIS, ROUTINE W REFLEX MICROSCOPIC
Bilirubin Urine: NEGATIVE
Glucose, UA: NEGATIVE
Hgb urine dipstick: NEGATIVE
Ketones, ur: NEGATIVE
Leukocytes,Ua: NEGATIVE
Nitrite: NEGATIVE
Protein, ur: NEGATIVE
Specific Gravity, Urine: 1.009 (ref 1.001–1.035)
pH: 5.5 (ref 5.0–8.0)

## 2022-07-05 LAB — CBC WITH DIFFERENTIAL/PLATELET
Absolute Monocytes: 760 cells/uL (ref 200–950)
Lymphs Abs: 1525 cells/uL (ref 850–3900)
MCV: 100 fL (ref 80.0–100.0)
MPV: 10.2 fL (ref 7.5–12.5)
Monocytes Relative: 13.1 %
Neutrophils Relative %: 58.7 %
Platelets: 187 10*3/uL (ref 140–400)
RBC: 4.34 10*6/uL (ref 4.20–5.80)
RDW: 12.3 % (ref 11.0–15.0)
Total Lymphocyte: 26.3 %

## 2022-07-05 LAB — TSH: TSH: 0.85 mIU/L (ref 0.40–4.50)

## 2022-07-05 LAB — LIPID PANEL
Cholesterol: 136 mg/dL (ref <200)
HDL: 56 mg/dL (ref >=40)
LDL Cholesterol (Calc): 64 mg/dL (calc)
Non-HDL Cholesterol (Calc): 80 mg/dL (calc) (ref <130)
Total CHOL/HDL Ratio: 2.4 (calc) (ref <5.0)
Triglycerides: 79 mg/dL (ref <150)

## 2022-07-05 LAB — VITAMIN D 25 HYDROXY (VIT D DEFICIENCY, FRACTURES): Vit D, 25-Hydroxy: 109 ng/mL — ABNORMAL HIGH (ref 30–100)

## 2022-07-05 LAB — INSULIN, RANDOM: Insulin: 6.8 u[IU]/mL

## 2022-07-05 LAB — PSA: PSA: 1.41 ng/mL (ref <=4.00)

## 2022-07-05 LAB — HEMOGLOBIN A1C
Hgb A1c MFr Bld: 5.3 % of total Hgb (ref <5.7)
Mean Plasma Glucose: 105 mg/dL
eAG (mmol/L): 5.8 mmol/L

## 2022-07-05 LAB — TESTOSTERONE: Testosterone: 1055 ng/dL — ABNORMAL HIGH (ref 250–827)

## 2022-07-05 LAB — MAGNESIUM: Magnesium: 1.4 mg/dL — ABNORMAL LOW (ref 1.5–2.5)

## 2022-07-06 NOTE — Progress Notes (Signed)
^<^<^<^<^<^<^<^<^<^<^<^<^<^<^<^<^<^<^<^<^<^<^<^<^<^<^<^<^<^<^<^<^<^<^<^<^ ^>^>^>^>^>^>^>^>^>^>^>>^>^>^>^>^>^>^>^>^>^>^>^>^>^>^>^>^>^>^>^>^>^>^>^>^>  -  Test results slightly outside the reference range are not unusual. If there is anything important, I will review this with you,  otherwise it is considered normal test values.  If you have further questions,  please do not hesitate to contact me at the office or via My Chart.   ^<^<^<^<^<^<^<^<^<^<^<^<^<^<^<^<^<^<^<^<^<^<^<^<^<^<^<^<^<^<^<^<^<^<^<^<^ ^>^>^>^>^>^>^>^>^>^>^>^>^>^>^>^>^>^>^>^>^>^>^>^>^>^>^>^>^>^>^>^>^>^>^>^>^  -  Magnesium  = 1.4 is  very  low- goal is betw 2.0 - 2.5,   - So....  Recommend that you take  Magnesium 500 mg tablet d  3 x /day with Meals  !  - also important to eat lots of  leafy green vegetables   - spinach - Kale - collards - greens - okra - asparagus  - broccoli - quinoa - squash - almonds   - black, red, white beans  -  peas - green beans ^>^>^>^>^>^>^>^>^>^>^>^>^>^>^>^>^>^>^>^>^>^>^>^>^>^>^>^>^>^>^>^>^>^>^>^>^  -  Vitamin D = 109 - Excellent - Please keep dose same  ^>^>^>^>^>^>^>^>^>^>^>^>^>^>^>^>^>^>^>^>^>^>^>^>^>^>^>^>^>^>^>^>^>^>^>^>^  - PSA - Low  - Great  - No Prostate cancee ^>^>^>^>^>^>^>^>^>^>^>^>^>^>^>^>^>^>^>^>^>^>^>^>^>^>^>^>^>^>^>^>^>^>^>^>^  - Chol = 136  & LDL  64  - Both  Excellent   - Very low risk for Heart Attack  / Stroke ^>^>^>^>^>^>^>^>^>^>^>^>^>^>^>^>^>^>^>^>^>^>^>^>^>^>^>^>^>^>^>^>^>^>^>^>^  - A1c - Normal - No  Diabetes - Great !   ^>^>^>^>^>^>^>^>^>^>^>^>^>^>^>^>^>^>^>^>^>^>^>^>^>^>^>^>^>^>^>^>^>^>^>^>^  -  Keep up the [place] Work  !   ^>^>^>^>^>^>^>^>^>^>^>^>^>^>^>^>^>^>^>^>^>^>^>^>^>^>^>^>^>^>^>^>^>^>^>^>^ ^>^>^>^>^>^>^>^>^>^>^>^>^>^>^>^>^>^>^>^>^>^>^>^>^>^>^>^>^>^>^>^>^>^>^>^>^

## 2022-07-15 ENCOUNTER — Other Ambulatory Visit: Payer: Self-pay | Admitting: Internal Medicine

## 2022-07-15 ENCOUNTER — Encounter: Payer: Self-pay | Admitting: Internal Medicine

## 2022-07-15 DIAGNOSIS — F419 Anxiety disorder, unspecified: Secondary | ICD-10-CM

## 2022-07-15 MED ORDER — ALPRAZOLAM 0.5 MG PO TABS: ORAL_TABLET | ORAL | 0 refills | Status: DC

## 2022-08-13 ENCOUNTER — Encounter: Payer: Self-pay | Admitting: Internal Medicine

## 2022-08-14 ENCOUNTER — Other Ambulatory Visit: Payer: Self-pay | Admitting: Internal Medicine

## 2022-08-14 DIAGNOSIS — F419 Anxiety disorder, unspecified: Secondary | ICD-10-CM

## 2022-08-14 MED ORDER — ALPRAZOLAM 0.25 MG PO TABS
ORAL_TABLET | ORAL | 0 refills | Status: DC
Start: 2022-08-14 — End: 2022-10-23

## 2022-08-17 ENCOUNTER — Encounter: Payer: Self-pay | Admitting: Internal Medicine

## 2022-08-20 ENCOUNTER — Other Ambulatory Visit: Payer: Self-pay | Admitting: Nurse Practitioner

## 2022-08-20 DIAGNOSIS — I10 Essential (primary) hypertension: Secondary | ICD-10-CM

## 2022-08-20 MED ORDER — DOXAZOSIN MESYLATE 8 MG PO TABS
ORAL_TABLET | ORAL | 3 refills | Status: DC
Start: 2022-08-20 — End: 2023-02-04

## 2022-08-27 ENCOUNTER — Encounter (INDEPENDENT_AMBULATORY_CARE_PROVIDER_SITE_OTHER): Payer: 59 | Admitting: Ophthalmology

## 2022-08-27 DIAGNOSIS — H35033 Hypertensive retinopathy, bilateral: Secondary | ICD-10-CM | POA: Diagnosis not present

## 2022-08-27 DIAGNOSIS — H353221 Exudative age-related macular degeneration, left eye, with active choroidal neovascularization: Secondary | ICD-10-CM

## 2022-08-27 DIAGNOSIS — H43813 Vitreous degeneration, bilateral: Secondary | ICD-10-CM

## 2022-08-27 DIAGNOSIS — H35712 Central serous chorioretinopathy, left eye: Secondary | ICD-10-CM

## 2022-08-27 DIAGNOSIS — I10 Essential (primary) hypertension: Secondary | ICD-10-CM | POA: Diagnosis not present

## 2022-09-24 ENCOUNTER — Encounter (INDEPENDENT_AMBULATORY_CARE_PROVIDER_SITE_OTHER): Payer: 59 | Admitting: Ophthalmology

## 2022-09-24 DIAGNOSIS — H353221 Exudative age-related macular degeneration, left eye, with active choroidal neovascularization: Secondary | ICD-10-CM

## 2022-09-24 DIAGNOSIS — I10 Essential (primary) hypertension: Secondary | ICD-10-CM

## 2022-09-24 DIAGNOSIS — H35712 Central serous chorioretinopathy, left eye: Secondary | ICD-10-CM

## 2022-09-24 DIAGNOSIS — H43813 Vitreous degeneration, bilateral: Secondary | ICD-10-CM

## 2022-09-24 DIAGNOSIS — H35033 Hypertensive retinopathy, bilateral: Secondary | ICD-10-CM | POA: Diagnosis not present

## 2022-10-23 ENCOUNTER — Encounter: Payer: Self-pay | Admitting: Internal Medicine

## 2022-10-23 ENCOUNTER — Other Ambulatory Visit: Payer: Self-pay | Admitting: Internal Medicine

## 2022-10-23 ENCOUNTER — Encounter (INDEPENDENT_AMBULATORY_CARE_PROVIDER_SITE_OTHER): Payer: 59 | Admitting: Ophthalmology

## 2022-10-23 DIAGNOSIS — H35033 Hypertensive retinopathy, bilateral: Secondary | ICD-10-CM

## 2022-10-23 DIAGNOSIS — H43813 Vitreous degeneration, bilateral: Secondary | ICD-10-CM

## 2022-10-23 DIAGNOSIS — I10 Essential (primary) hypertension: Secondary | ICD-10-CM | POA: Diagnosis not present

## 2022-10-23 DIAGNOSIS — H35712 Central serous chorioretinopathy, left eye: Secondary | ICD-10-CM | POA: Diagnosis not present

## 2022-10-23 DIAGNOSIS — H353221 Exudative age-related macular degeneration, left eye, with active choroidal neovascularization: Secondary | ICD-10-CM | POA: Diagnosis not present

## 2022-10-23 DIAGNOSIS — H2513 Age-related nuclear cataract, bilateral: Secondary | ICD-10-CM

## 2022-10-25 ENCOUNTER — Encounter: Payer: Self-pay | Admitting: Nurse Practitioner

## 2022-10-25 ENCOUNTER — Ambulatory Visit (INDEPENDENT_AMBULATORY_CARE_PROVIDER_SITE_OTHER): Payer: 59 | Admitting: Nurse Practitioner

## 2022-10-25 VITALS — BP 110/78 | HR 83 | Temp 98.1°F | Ht 66.5 in | Wt 174.8 lb

## 2022-10-25 DIAGNOSIS — I4892 Unspecified atrial flutter: Secondary | ICD-10-CM

## 2022-10-25 DIAGNOSIS — E349 Endocrine disorder, unspecified: Secondary | ICD-10-CM

## 2022-10-25 DIAGNOSIS — I2510 Atherosclerotic heart disease of native coronary artery without angina pectoris: Secondary | ICD-10-CM

## 2022-10-25 DIAGNOSIS — N1831 Chronic kidney disease, stage 3a: Secondary | ICD-10-CM

## 2022-10-25 DIAGNOSIS — I4729 Other ventricular tachycardia: Secondary | ICD-10-CM

## 2022-10-25 DIAGNOSIS — E782 Mixed hyperlipidemia: Secondary | ICD-10-CM

## 2022-10-25 DIAGNOSIS — I130 Hypertensive heart and chronic kidney disease with heart failure and stage 1 through stage 4 chronic kidney disease, or unspecified chronic kidney disease: Secondary | ICD-10-CM

## 2022-10-25 DIAGNOSIS — K5790 Diverticulosis of intestine, part unspecified, without perforation or abscess without bleeding: Secondary | ICD-10-CM

## 2022-10-25 DIAGNOSIS — M48061 Spinal stenosis, lumbar region without neurogenic claudication: Secondary | ICD-10-CM

## 2022-10-25 DIAGNOSIS — R7309 Other abnormal glucose: Secondary | ICD-10-CM

## 2022-10-25 DIAGNOSIS — I10 Essential (primary) hypertension: Secondary | ICD-10-CM | POA: Diagnosis not present

## 2022-10-25 DIAGNOSIS — N183 Chronic kidney disease, stage 3 unspecified: Secondary | ICD-10-CM

## 2022-10-25 DIAGNOSIS — Z79899 Other long term (current) drug therapy: Secondary | ICD-10-CM

## 2022-10-25 DIAGNOSIS — K219 Gastro-esophageal reflux disease without esophagitis: Secondary | ICD-10-CM

## 2022-10-25 DIAGNOSIS — E559 Vitamin D deficiency, unspecified: Secondary | ICD-10-CM

## 2022-10-25 NOTE — Progress Notes (Signed)
FOLLOW UP Assessment:   Essential hypertension Continue medications Discussed DASH (Dietary Approaches to Stop Hypertension) DASH diet is lower in sodium than a typical American diet. Cut back on foods that are high in saturated fat, cholesterol, and trans fats. Eat more whole-grain foods, fish, poultry, and nuts Remain active and exercise as tolerated daily.  Monitor BP at home-Call if greater than 130/80.  Check CMP/CBC  NSVT (nonsustained ventricular tachycardia) (HCC) Controlled. No longer follows with Cardiology,  Continue medications; Clopidogrel, Atenolol, Diltiazem, Doxazosin, Furosemide Continue to monitor  Atrial flutter, paroxysmal (HCC) Controlled. No longer follows with Cardiology Continue  medications as directed  Coronary artery disease involving native coronary artery of native heart without angina pectoris Has stent placed Controlled. Continue medications Continue to monitor  Hyperlipidemia Discussed lifestyle modifications. Recommended diet heavy in fruits and veggies, omega 3's. Decrease consumption of animal meats, cheeses, and dairy products. Remain active and exercise as tolerated. Continue to monitor. Check lipids/TSH  Benign hypertensive heart and CKD, stage 3 (GFR 30-59), w CHF (HCC) Discussed how what you eat and drink can aide in kidney protection. Stay well hydrated. Avoid high salt foods. Avoid NSAIDS. Keep BP and BG well controlled.   Take medications as prescribed. Remain active and exercise as tolerated daily. Maintain weight.  Continue to monitor.  Gastroesophageal reflux disease, unspecified whether esophagitis present Continue medications; Pantoprazole No suspected reflux complications (Barret/stricture). Lifestyle modification:  wt loss, avoid meals 2-3h before bedtime. Consider eliminating food triggers:  chocolate, caffeine, EtOH, acid/spicy food.   Diverticulosis No recent flare. Discussed high fiber diet. Stay well  hydrated. Continue to monitor  Other abnormal glucose (hx of prediabetes) Education: Reviewed 'ABCs' of diabetes management  A1C (<7) Blood pressure (<130/80) Cholesterol (LDL <70) Continue Eye Exam yearly  Continue Dental Exam Q6 mo Discussed dietary recommendations Discussed Physical Activity recommendations  Vitamin D deficiency Elevated last OV 06/2022 Continue supplement. Continue to monitor  Testosterone deficiency Continue weekly injections Continue to monitor levels  Spinal stenosis of lumbar region, unspecified whether neurogenic claudication present Follows with [facility] Continue to monitor  Medication management All medications discussed and reviewed in full. All questions and concerns regarding medications addressed.    Hypomagnesia Monitor levels   Weight loss Likely caloric deficit considering dietary modifications. Suggest food log to monitor caloric intake versus loss from exercise. Continue to monitor. Possible further work up with CT if weight loss continues.    Orders Placed This Encounter  Procedures   CBC with Differential/Platelet   COMPLETE METABOLIC PANEL WITH GFR   Magnesium   Lipid panel   VITAMIN D 25 Hydroxy (Vit-D Deficiency, Fractures)   Notify office for further evaluation and treatment, questions or concerns if any reported s/s fail to improve.   The patient was advised to call back or seek an in-person evaluation if any symptoms worsen or if the condition fails to improve as anticipated.   Further disposition pending results of labs. Discussed med's effects and SE's.    I discussed the assessment and treatment plan with the patient. The patient was provided an opportunity to ask questions and all were answered. The patient agreed with the plan and demonstrated an understanding of the instructions.  Discussed med's effects and SE's. Screening labs and tests as requested with regular follow-up as recommended.  I provided 20  minutes of face-to-face time during this encounter including counseling, chart review, and critical decision making was preformed.   Future Appointments  Date Time Provider Department Center  11/23/2022 12:20  PM Sherrie George, MD [facility] None  01/28/2023 10:30 AM Lucky Cowboy, MD [facility] None  04/29/2023 10:30 AM Adela Glimpse, NP [facility] None  08/01/2023 10:00 AM Lucky Cowboy, MD [facility] None     Subjective:  Marvin Carter is a 67 y.o. male who presents for a general 3 month follow up. He has Prolapsed external hemorrhoids; Hemorrhoids, internal, with bleeding; NSVT (nonsustained ventricular tachycardia) (HCC); Atrial flutter, paroxysmal (HCC); CAD (coronary artery disease), native coronary artery; Stented coronary artery; Essential hypertension; GERD (gastroesophageal reflux disease); Diverticulosis; Other abnormal glucose (hx of prediabetes); Vitamin D deficiency; Medication management; Testosterone deficiency; Drug-induced erectile dysfunction; Primary osteoarthritis of right knee; Benign hypertensive heart and CKD, stage 3 (GFR 30-59), w CHF (HCC); Obesity (BMI 30.0-34.9); and Spinal stenosis of lumbar region on their problem list.  Overall patient reports feeling well.  He is concerned for trending weight loss.  He admits to dietary modifications, splitting meals with wife, eating more fruits and veggies, as well as not feeling as hungry.  Works out daily in [employer] with cardio and weight lifting. No known cancer in family.  Recent PSA levels 06/2022 WNL, Colonoscopy UTD 2022 with 10 year recall.  Denies any other symptoms including blood in stool, urine, difficulty swallowing, abdominal pain, HA, vision changes.  Denies any new supplements or medications.  Denies BM changes.  He had a recent lumbar ablation 09/2020.  Has seen some mild improvement as times passes but continues to have difficulty standing or walking for long periods of time.  Saw [facility] 09/11/23  for back pain an dleft sided neck pain. He had a bilateral lumbar radiofrequency neurotomy procedure 07/31/21.  He had pain initially after the first 3 weeks then then pain started to calm.  He is now doing well overall.  Continues on meloxicam 7.5 mg and Advil Tylenol. Has Flexeril PRN. He will take Oxy 5 mg PRN. Does not wish to pursue any further surgical intervention at this time.  He receives weekly testosterone injections.    BMI is Body mass index is 27.79 kg/m., he has been working on diet and exercise.  He works out at [employer] 3 times a week. Wt Readings from Last 3 Encounters:  10/25/22 174 lb 12.8 oz (79.3 kg)  07/04/22 184 lb 12.8 oz (83.8 kg)  05/28/22 187 lb 6.4 oz (85 kg)   His blood pressure has been controlled at home, today their BP is BP: 110/78 He does workout. He denies chest pain, shortness of breath, dizziness.  He is on cholesterol medication Atorvastatin and denies myalgias. His cholesterol is at goal other than elevated triglycerides. The cholesterol last visit was:   Lab Results  Component Value Date   CHOL 136 07/04/2022   HDL 56 07/04/2022   LDLCALC 64 07/04/2022   TRIG 79 07/04/2022   CHOLHDL 2.4 07/04/2022   He has been working on diet and exercise for management of prediabetes, and denies polydipsia and polyuria. Last A1C in the office was:  Lab Results  Component Value Date   HGBA1C 5.3 07/04/2022   Last GFR Lab Results  Component Value Date   EGFR 64 07/04/2022   Patient is on Vitamin D supplement.   Lab Results  Component Value Date   VD25OH 109 (H) 07/04/2022     He has a hx of GERD which is controlled with Pantoprazole and lifestyle modifications.  He follows with Dr. Tomasa Rand.  He is UTD w/colonoscopy.    Medication Review:  Current Outpatient Medications (  Endocrine & Metabolic):    testosterone cypionate (DEPOTESTOSTERONE CYPIONATE) 200 MG/ML injection, INJECT 1 MLS INTRAMUSCULARLY EVERY WEEK  Current Outpatient Medications  (Cardiovascular):    atenolol (TENORMIN) 100 MG tablet, Take  1 tablet  Daily for BP                                                                                               /                                                  TAKE                                                     BY                                                              MOUTH   atorvastatin (LIPITOR) 40 MG tablet, Take  1 tablet  Daily  for Cholesterol                                                                      /                                           TAKE                                                     BY                                               MOUTH   diltiazem (CARDIZEM LA) 240 MG 24 hr tablet, Take 1 tablet every day for BP (Patient taking differently: Takes 1/2 tablet every day for BP)   doxazosin (CARDURA) 8 MG tablet, TAKE 1 TABLET BY MOUTH AT BEDTIME FOR BLOOD PRESSURE OR PROSTATE   furosemide (LASIX) 80 MG tablet, TAKE 1 TABLET BY MOUTH EVERY DAY FOR BLOOD PRESSURE AND FLUID RETENTION OR ANKLE SWELLING  nitroGLYCERIN (NITROSTAT) 0.4 MG SL tablet, Dissolve 1 tablet under tongue every 3 to 5 minutes as needed for Chest Pain ( Do NOT take with Cialis )   olmesartan (BENICAR) 40 MG tablet, Take 1/2  to 1 tablet Daily for BP   tadalafil (CIALIS) 20 MG tablet, Take 1/2 to 1 tablet every 2 to 3 days as needed for XXXX   Current Outpatient Medications (Analgesics):    acetaminophen (TYLENOL) 325 MG tablet, Take 2 tablets (650 mg total) by mouth every 4 (four) hours as needed for mild pain ((score 1 to 3) or temp > 100.5).   ASPIRIN 81 PO, Take 81 mg by mouth daily.    meloxicam (MOBIC) 15 MG tablet, TAKE 1/2 TO 1 TABLET BY MOUTH EVERY DAY WITH FOOD FOR PAIN AND INFLAMMATION AND LIMIT TO 4-5 DAYS/WEEK TO AVOID KIDNEY DAMAGE  Current Outpatient Medications (Hematological):    clopidogrel (PLAVIX) 75 MG tablet, Take 1 tablet Daily to prevent Blood Clots  Current Outpatient Medications (Other):     Ascorbic Acid (VITAMIN C PO), Take 1 tablet by mouth daily as needed (for immune system support).   buPROPion (WELLBUTRIN XL) 300 MG 24 hr tablet, Take  1 tablet every Morning for Moos, Focus & Concentration                                                    /                                              TAKE                                               BY                                                   MOUTH   cyclobenzaprine (FLEXERIL) 10 MG tablet, Take 1/2 to 1 tablet 3 x /day as needed for Muscle Spasm   diclofenac sodium (VOLTAREN) 1 % GEL, Apply 2 to 4 grams 2 to 4 x / day   Magnesium 400 MG CAPS, Take 2 capsules by mouth daily.   Multiple Vitamin (MULTIVITAMIN WITH MINERALS) TABS, Take 2 tablets by mouth daily. Mega Men Multivitamin.   pantoprazole (PROTONIX) 40 MG tablet, Take  1 tablet  Daily to Prevent Heartburn & Indigestion                                                                        /                          TAKE  BY                                                        MOUTH   tamsulosin (FLOMAX) 0.4 MG CAPS capsule, TAKE 1 CAPSULE BY MOUTH EVERY NIGHT AT BEDTIME FOR PROSTATE/URINE FLOW   traZODone (DESYREL) 150 MG tablet, Take by mouth at bedtime.  Allergies: Allergies  Allergen Reactions   Hctz [Hydrochlorothiazide]    Tape Rash    Clear tape    Current Problems (verified) has Prolapsed external hemorrhoids; Hemorrhoids, internal, with bleeding; NSVT (nonsustained ventricular tachycardia) (HCC); Atrial flutter, paroxysmal (HCC); CAD (coronary artery disease), native coronary artery; Stented coronary artery; Essential hypertension; GERD (gastroesophageal reflux disease); Diverticulosis; Other abnormal glucose (hx of prediabetes); Vitamin D deficiency; Medication management; Testosterone deficiency; Drug-induced erectile dysfunction; Primary osteoarthritis of right knee; Benign hypertensive heart and CKD, stage 3  (GFR 30-59), w CHF (HCC); Obesity (BMI 30.0-34.9); and Spinal stenosis of lumbar region on their problem list.  Screening Tests Immunization History  Administered Date(s) Administered   Influenza Split 12/16/2013, 12/02/2014   Influenza Whole 12/09/2012   Influenza, Seasonal, Injecte, Preservative Fre 12/01/2015   Influenza-Unspecified 11/19/2016, 11/30/2017   PFIZER Comirnaty(Gray Top)Covid-19 Tri-Sucrose Vaccine 07/26/2020   PFIZER(Purple Top)SARS-COV-2 Vaccination 05/21/2019, 06/12/2019   PPD Test 11/05/2013, 01/04/2015, 02/10/2016, 03/21/2017, 04/16/2018   Pfizer Covid-19 Vaccine Bivalent Booster 46yrs & up 12/02/2020   Td 02/19/2005   Tdap 03/01/2015   Health Maintenance  Topic Date Due   Zoster Vaccines- Shingrix (1 of 2) Never done   Pneumonia Vaccine 72+ Years old (1 of 1 - PCV) Never done   INFLUENZA VACCINE  09/20/2022   COVID-19 Vaccine (5 - 2023-24 season) 10/21/2022   DTaP/Tdap/Td (3 - Td or Tdap) 02/28/2025   Colonoscopy  11/16/2030   Hepatitis C Screening  Completed   HPV VACCINES  Aged Out    Patient Care Team: Lucky Cowboy, MD as PCP - General (Internal Medicine) Runell Gess, MD as PCP - Cardiology (Cardiology) Louis Meckel, MD (Inactive) as Consulting Physician (Gastroenterology)  Surgical: He  has a past surgical history that includes Appendectomy; Knee arthroscopy; Rotator cuff repair; Hand surgery; Tonsillectomy; Knee arthroscopy (Right, 04/21/2012); Coronary angioplasty with stent (2012); left heart catheterization with coronary angiogram (N/A, 01/03/2011); percutaneous coronary stent intervention (pci-s) (N/A, 01/03/2011); Colonoscopy; Triceps tendon repair (Bilateral); Total knee arthroplasty (Right, 04/15/2017); Total knee arthroplasty (Right, 04/15/2017); Carpal tunnel release (Right, 2021); and Elbow surgery (Right, 2021). Family His family history includes Coronary artery disease in his father; Diabetes in his brother and mother; HIV/AIDS in  his brother; Heart attack in his mother; Heart disease in his father and mother; Hypertension in his brother; Obesity in his brother. Social history  He reports that he has never smoked. He has never used smokeless tobacco. He reports current alcohol use of about 10.0 standard drinks of alcohol per week. He reports that he does not use drugs.  Objective:   Today's Vitals   10/25/22 1022  BP: 110/78  Pulse: 83  Temp: 98.1 F (36.7 C)  SpO2: 95%  Weight: 174 lb 12.8 oz (79.3 kg)  Height: 5' 6.5" (1.689 m)    Body mass index is 27.79 kg/m.  General appearance: alert, no distress, WD/WN, male HEENT: normocephalic, sclerae anicteric, TMs pearly, nares patent, no discharge or erythema, pharynx normal Oral  cavity: MMM, no lesions Neck: supple, no lymphadenopathy, no thyromegaly, no masses Heart: RRR, normal S1, S2, no murmurs Lungs: CTA bilaterally, no wheezes, rhonchi, or rales Abdomen: +bs, soft, non tender, non distended, no masses, no hepatomegaly, no splenomegaly Musculoskeletal: nontender, no swelling, no obvious deformity Extremities: no edema, no cyanosis, no clubbing Pulses: 2+ symmetric, upper and lower extremities, normal cap refill Neurological: alert, oriented x 3, CN2-12 intact, strength normal upper extremities and lower extremities, sensation normal throughout, DTRs 2+ throughout, no cerebellar signs, gait normal Psychiatric: normal affect, behavior normal, pleasant   Erving Sassano, NP   10/25/2022

## 2022-10-25 NOTE — Patient Instructions (Signed)
Calorie Counting for Weight Loss Calories are units of energy. Your body needs a certain number of calories from food to keep going throughout the day. When you eat or drink more calories than your body needs, your body stores the extra calories mostly as fat. When you eat or drink fewer calories than your body needs, your body burns fat to get the energy it needs. Calorie counting means keeping track of how many calories you eat and drink each day. Calorie counting can be helpful if you need to lose weight. If you eat fewer calories than your body needs, you should lose weight. Ask your health care provider what a healthy weight is for you. For calorie counting to work, you will need to eat the right number of calories each day to lose a healthy amount of weight per week. A dietitian can help you figure out how many calories you need in a day and will suggest ways to reach your calorie goal. A healthy amount of weight to lose each week is usually 1-2 lb (0.5-0.9 kg). This usually means that your daily calorie intake should be reduced by 500-750 calories. Eating 1,200-1,500 calories a day can help most women lose weight. Eating 1,500-1,800 calories a day can help most men lose weight. What do I need to know about calorie counting? Work with your health care provider or dietitian to determine how many calories you should get each day. To meet your daily calorie goal, you will need to: Find out how many calories are in each food that you would like to eat. Try to do this before you eat. Decide how much of the food you plan to eat. Keep a food log. Do this by writing down what you ate and how many calories it had. To successfully lose weight, it is important to balance calorie counting with a healthy lifestyle that includes regular activity. Where do I find calorie information?  The number of calories in a food can be found on a Nutrition Facts label. If a food does not have a Nutrition Facts label, try  to look up the calories online or ask your dietitian for help. Remember that calories are listed per serving. If you choose to have more than one serving of a food, you will have to multiply the calories per serving by the number of servings you plan to eat. For example, the label on a package of bread might say that a serving size is 1 slice and that there are 90 calories in a serving. If you eat 1 slice, you will have eaten 90 calories. If you eat 2 slices, you will have eaten 180 calories. How do I keep a food log? After each time that you eat, record the following in your food log as soon as possible: What you ate. Be sure to include toppings, sauces, and other extras on the food. How much you ate. This can be measured in cups, ounces, or number of items. How many calories were in each food and drink. The total number of calories in the food you ate. Keep your food log near you, such as in a pocket-sized notebook or on an app or website on your mobile phone. Some programs will calculate calories for you and show you how many calories you have left to meet your daily goal. What are some portion-control tips? Know how many calories are in a serving. This will help you know how many servings you can have of a certain   food. Use a measuring cup to measure serving sizes. You could also try weighing out portions on a kitchen scale. With time, you will be able to estimate serving sizes for some foods. Take time to put servings of different foods on your favorite plates or in your favorite bowls and cups so you know what a serving looks like. Try not to eat straight from a food's packaging, such as from a bag or box. Eating straight from the package makes it hard to see how much you are eating and can lead to overeating. Put the amount you would like to eat in a cup or on a plate to make sure you are eating the right portion. Use smaller plates, glasses, and bowls for smaller portions and to prevent  overeating. Try not to multitask. For example, avoid watching TV or using your computer while eating. If it is time to eat, sit down at a table and enjoy your food. This will help you recognize when you are full. It will also help you be more mindful of what and how much you are eating. What are tips for following this plan? Reading food labels Check the calorie count compared with the serving size. The serving size may be smaller than what you are used to eating. Check the source of the calories. Try to choose foods that are high in protein, fiber, and vitamins, and low in saturated fat, trans fat, and sodium. Shopping Read nutrition labels while you shop. This will help you make healthy decisions about which foods to buy. Pay attention to nutrition labels for low-fat or fat-free foods. These foods sometimes have the same number of calories or more calories than the full-fat versions. They also often have added sugar, starch, or salt to make up for flavor that was removed with the fat. Make a grocery list of lower-calorie foods and stick to it. Cooking Try to cook your favorite foods in a healthier way. For example, try baking instead of frying. Use low-fat dairy products. Meal planning Use more fruits and vegetables. One-half of your plate should be fruits and vegetables. Include lean proteins, such as chicken, turkey, and fish. Lifestyle Each week, aim to do one of the following: 150 minutes of moderate exercise, such as walking. 75 minutes of vigorous exercise, such as running. General information Know how many calories are in the foods you eat most often. This will help you calculate calorie counts faster. Find a way of tracking calories that works for you. Get creative. Try different apps or programs if writing down calories does not work for you. What foods should I eat?  Eat nutritious foods. It is better to have a nutritious, high-calorie food, such as an avocado, than a food with  few nutrients, such as a bag of potato chips. Use your calories on foods and drinks that will fill you up and will not leave you hungry soon after eating. Examples of foods that fill you up are nuts and nut butters, vegetables, lean proteins, and high-fiber foods such as whole grains. High-fiber foods are foods with more than 5 g of fiber per serving. Pay attention to calories in drinks. Low-calorie drinks include water and unsweetened drinks. The items listed above may not be a complete list of foods and beverages you can eat. Contact a dietitian for more information. What foods should I limit? Limit foods or drinks that are not good sources of vitamins, minerals, or protein or that are high in unhealthy fats. These   include: Candy. Other sweets. Sodas, specialty coffee drinks, alcohol, and juice. The items listed above may not be a complete list of foods and beverages you should avoid. Contact a dietitian for more information. How do I count calories when eating out? Pay attention to portions. Often, portions are much larger when eating out. Try these tips to keep portions smaller: Consider sharing a meal instead of getting your own. If you get your own meal, eat only half of it. Before you start eating, ask for a container and put half of your meal into it. When available, consider ordering smaller portions from the menu instead of full portions. Pay attention to your food and drink choices. Knowing the way food is cooked and what is included with the meal can help you eat fewer calories. If calories are listed on the menu, choose the lower-calorie options. Choose dishes that include vegetables, fruits, whole grains, low-fat dairy products, and lean proteins. Choose items that are boiled, broiled, grilled, or steamed. Avoid items that are buttered, battered, fried, or served with cream sauce. Items labeled as crispy are usually fried, unless stated otherwise. Choose water, low-fat milk,  unsweetened iced tea, or other drinks without added sugar. If you want an alcoholic beverage, choose a lower-calorie option, such as a glass of wine or light beer. Ask for dressings, sauces, and syrups on the side. These are usually high in calories, so you should limit the amount you eat. If you want a salad, choose a garden salad and ask for grilled meats. Avoid extra toppings such as bacon, cheese, or fried items. Ask for the dressing on the side, or ask for olive oil and vinegar or lemon to use as dressing. Estimate how many servings of a food you are given. Knowing serving sizes will help you be aware of how much food you are eating at restaurants. Where to find more information Centers for Disease Control and Prevention: www.cdc.gov U.[place]. Department of Agriculture: myplate.gov Summary Calorie counting means keeping track of how many calories you eat and drink each day. If you eat fewer calories than your body needs, you should lose weight. A healthy amount of weight to lose per week is usually 1-2 lb (0.5-0.9 kg). This usually means reducing your daily calorie intake by 500-750 calories. The number of calories in a food can be found on a Nutrition Facts label. If a food does not have a Nutrition Facts label, try to look up the calories online or ask your dietitian for help. Use smaller plates, glasses, and bowls for smaller portions and to prevent overeating. Use your calories on foods and drinks that will fill you up and not leave you hungry shortly after a meal. This information is not intended to replace advice given to you by your health care provider. Make sure you discuss any questions you have with your health care provider. Document Revised: 03/19/2019 Document Reviewed: 03/19/2019 Elsevier Patient Education  2023 Elsevier Inc.  

## 2022-10-26 ENCOUNTER — Other Ambulatory Visit: Payer: Self-pay | Admitting: Nurse Practitioner

## 2022-10-26 ENCOUNTER — Encounter: Payer: Self-pay | Admitting: Nurse Practitioner

## 2022-10-26 DIAGNOSIS — D7589 Other specified diseases of blood and blood-forming organs: Secondary | ICD-10-CM

## 2022-10-26 LAB — LIPID PANEL
Cholesterol: 149 mg/dL (ref <200)
HDL: 64 mg/dL (ref >=40)
LDL Cholesterol (Calc): 59 mg/dL
Non-HDL Cholesterol (Calc): 85 mg/dL (ref <130)
Total CHOL/HDL Ratio: 2.3 (calc) (ref <5.0)
Triglycerides: 191 mg/dL — ABNORMAL HIGH (ref <150)

## 2022-10-26 LAB — COMPLETE METABOLIC PANEL WITH GFR
AG Ratio: 1.8 (calc) (ref 1.0–2.5)
ALT: 37 U/L (ref 9–46)
AST: 48 U/L — ABNORMAL HIGH (ref 10–35)
Albumin: 4.6 g/dL (ref 3.6–5.1)
Alkaline phosphatase (APISO): 54 U/L (ref 35–144)
BUN/Creatinine Ratio: 15 (calc) (ref 6–22)
BUN: 21 mg/dL (ref 7–25)
CO2: 24 mmol/L (ref 20–32)
Calcium: 10 mg/dL (ref 8.6–10.3)
Chloride: 103 mmol/L (ref 98–110)
Creat: 1.37 mg/dL — ABNORMAL HIGH (ref 0.70–1.35)
Globulin: 2.6 g/dL (ref 1.9–3.7)
Glucose, Bld: 97 mg/dL (ref 65–99)
Potassium: 4.2 mmol/L (ref 3.5–5.3)
Sodium: 140 mmol/L (ref 135–146)
Total Bilirubin: 1.1 mg/dL (ref 0.2–1.2)
Total Protein: 7.2 g/dL (ref 6.1–8.1)
eGFR: 57 mL/min/{1.73_m2} — ABNORMAL LOW (ref >=60)

## 2022-10-26 LAB — CBC WITH DIFFERENTIAL/PLATELET
Absolute Monocytes: 826 {cells}/uL (ref 200–950)
Basophils Absolute: 20 {cells}/uL (ref 0–200)
Basophils Relative: 0.3 %
Eosinophils Absolute: 39 {cells}/uL (ref 15–500)
Eosinophils Relative: 0.6 %
HCT: 45.5 % (ref 38.5–50.0)
Hemoglobin: 15.6 g/dL (ref 13.2–17.1)
Lymphs Abs: 1326 {cells}/uL (ref 850–3900)
MCH: 35.2 pg — ABNORMAL HIGH (ref 27.0–33.0)
MCHC: 34.3 g/dL (ref 32.0–36.0)
MCV: 102.7 fL — ABNORMAL HIGH (ref 80.0–100.0)
MPV: 10.1 fL (ref 7.5–12.5)
Monocytes Relative: 12.7 %
Neutro Abs: 4290 {cells}/uL (ref 1500–7800)
Neutrophils Relative %: 66 %
Platelets: 202 10*3/uL (ref 140–400)
RBC: 4.43 10*6/uL (ref 4.20–5.80)
RDW: 12 % (ref 11.0–15.0)
Total Lymphocyte: 20.4 %
WBC: 6.5 10*3/uL (ref 3.8–10.8)

## 2022-10-26 LAB — MAGNESIUM: Magnesium: 1.5 mg/dL (ref 1.5–2.5)

## 2022-10-26 LAB — VITAMIN D 25 HYDROXY (VIT D DEFICIENCY, FRACTURES): Vit D, 25-Hydroxy: 93 ng/mL (ref 30–100)

## 2022-10-30 ENCOUNTER — Other Ambulatory Visit: Payer: 59

## 2022-10-30 ENCOUNTER — Ambulatory Visit (INDEPENDENT_AMBULATORY_CARE_PROVIDER_SITE_OTHER): Payer: 59

## 2022-10-30 DIAGNOSIS — D7589 Other specified diseases of blood and blood-forming organs: Secondary | ICD-10-CM

## 2022-10-30 NOTE — Progress Notes (Signed)
Patient presents to the office for a nurse visit to have labs done to recheck Macrocytosis.

## 2022-10-31 ENCOUNTER — Encounter: Payer: Self-pay | Admitting: Nurse Practitioner

## 2022-11-02 LAB — CBC WITH DIFFERENTIAL/PLATELET
Absolute Monocytes: 922 {cells}/uL (ref 200–950)
Basophils Absolute: 32 {cells}/uL (ref 0–200)
Basophils Relative: 0.5 %
Eosinophils Absolute: 58 {cells}/uL (ref 15–500)
Eosinophils Relative: 0.9 %
HCT: 41.6 % (ref 38.5–50.0)
Hemoglobin: 14.3 g/dL (ref 13.2–17.1)
Lymphs Abs: 1594 {cells}/uL (ref 850–3900)
MCH: 35.2 pg — ABNORMAL HIGH (ref 27.0–33.0)
MCHC: 34.4 g/dL (ref 32.0–36.0)
MCV: 102.5 fL — ABNORMAL HIGH (ref 80.0–100.0)
MPV: 10.1 fL (ref 7.5–12.5)
Monocytes Relative: 14.4 %
Neutro Abs: 3795 {cells}/uL (ref 1500–7800)
Neutrophils Relative %: 59.3 %
Platelets: 197 Thousand/uL (ref 140–400)
RBC: 4.06 Million/uL — ABNORMAL LOW (ref 4.20–5.80)
RDW: 11.9 % (ref 11.0–15.0)
Total Lymphocyte: 24.9 %
WBC: 6.4 Thousand/uL (ref 3.8–10.8)

## 2022-11-02 LAB — FOLATE RBC: RBC Folate: 581 ng/mL

## 2022-11-02 LAB — VITAMIN B12: Vitamin B-12: >2000 pg/mL — ABNORMAL HIGH (ref 200–1100)

## 2022-11-06 ENCOUNTER — Encounter: Payer: Self-pay | Admitting: Nurse Practitioner

## 2022-11-06 DIAGNOSIS — E349 Endocrine disorder, unspecified: Secondary | ICD-10-CM

## 2022-11-08 ENCOUNTER — Encounter: Payer: Self-pay | Admitting: Nurse Practitioner

## 2022-11-08 ENCOUNTER — Other Ambulatory Visit: Payer: Self-pay | Admitting: Nurse Practitioner

## 2022-11-08 DIAGNOSIS — E349 Endocrine disorder, unspecified: Secondary | ICD-10-CM

## 2022-11-08 DIAGNOSIS — N529 Male erectile dysfunction, unspecified: Secondary | ICD-10-CM

## 2022-11-08 MED ORDER — TESTOSTERONE CYPIONATE 200 MG/ML IM SOLN
INTRAMUSCULAR | 2 refills | Status: DC
Start: 2022-11-08 — End: 2023-02-04

## 2022-11-08 MED ORDER — TRAZODONE HCL 150 MG PO TABS: 150.0000 mg | ORAL_TABLET | Freq: Every day | ORAL | 1 refills | Status: DC

## 2022-11-08 MED ORDER — TESTOSTERONE CYPIONATE 200 MG/ML IM SOLN
INTRAMUSCULAR | 2 refills | Status: DC
Start: 2022-11-08 — End: 2022-11-08

## 2022-11-08 MED ORDER — TADALAFIL 20 MG PO TABS
ORAL_TABLET | ORAL | 1 refills | Status: AC
Start: 2022-11-08 — End: ?

## 2022-11-16 ENCOUNTER — Other Ambulatory Visit: Payer: Self-pay | Admitting: Nurse Practitioner

## 2022-11-16 DIAGNOSIS — N138 Other obstructive and reflux uropathy: Secondary | ICD-10-CM

## 2022-11-18 ENCOUNTER — Other Ambulatory Visit: Payer: Self-pay | Admitting: Internal Medicine

## 2022-11-18 DIAGNOSIS — N529 Male erectile dysfunction, unspecified: Secondary | ICD-10-CM

## 2022-11-18 MED ORDER — TADALAFIL 20 MG PO TABS: ORAL_TABLET | ORAL | 3 refills | Status: AC

## 2022-11-21 ENCOUNTER — Encounter: Payer: Self-pay | Admitting: Nurse Practitioner

## 2022-11-23 ENCOUNTER — Encounter (INDEPENDENT_AMBULATORY_CARE_PROVIDER_SITE_OTHER): Payer: 59 | Admitting: Ophthalmology

## 2022-11-23 DIAGNOSIS — H35712 Central serous chorioretinopathy, left eye: Secondary | ICD-10-CM | POA: Diagnosis not present

## 2022-11-23 DIAGNOSIS — H353221 Exudative age-related macular degeneration, left eye, with active choroidal neovascularization: Secondary | ICD-10-CM

## 2022-11-23 DIAGNOSIS — H353111 Nonexudative age-related macular degeneration, right eye, early dry stage: Secondary | ICD-10-CM | POA: Diagnosis not present

## 2022-11-23 DIAGNOSIS — H35033 Hypertensive retinopathy, bilateral: Secondary | ICD-10-CM

## 2022-11-23 DIAGNOSIS — H43813 Vitreous degeneration, bilateral: Secondary | ICD-10-CM

## 2022-11-23 DIAGNOSIS — I10 Essential (primary) hypertension: Secondary | ICD-10-CM | POA: Diagnosis not present

## 2022-12-11 ENCOUNTER — Encounter: Payer: Self-pay | Admitting: Internal Medicine

## 2022-12-12 ENCOUNTER — Telehealth: Payer: Self-pay

## 2022-12-12 NOTE — Telephone Encounter (Signed)
    Primary Cardiologist:Jonathan Allyson Sabal, MD  Chart reviewed as part of pre-operative protocol coverage. Because of Marvin Carter's past medical history and time since last visit, he/she will require a follow-up visit in order to better assess preoperative cardiovascular risk.  Pre-op covering staff: - Please schedule in office appointment and call patient to inform them. - Please contact requesting surgeon's office via preferred method (i.e, phone, fax) to inform them of need for appointment prior to surgery.  If applicable, this message will also be routed to pharmacy pool and/or primary cardiologist for input on holding anticoagulant/antiplatelet agent as requested below so that this information is available at time of patient's appointment.   Ronney Asters, NP  12/12/2022, 1:08 PM

## 2022-12-12 NOTE — Telephone Encounter (Signed)
Pt has appt 01/10/23 with Dr. [name] as new pt. I will update all parties involved.

## 2022-12-12 NOTE — Telephone Encounter (Signed)
Pre-operative Risk Assessment    Patient Name: Marvin Carter  DOB: 06-19-1955 MRN: [id number]      Request for Surgical Clearance    Procedure:   lt total knee replacement   Date of Surgery:  Clearance TBD                                 Surgeon:  Dr. Weber Cooks Surgeon's Group or Practice Name:  [facility] Phone number:  [phone] Fax number:  [phone]   Type of Clearance Requested:   - Medical    Type of Anesthesia:  Spinal   Additional requests/questions:    SignedDione Housekeeper   12/12/2022, 11:44 AM

## 2023-01-08 ENCOUNTER — Other Ambulatory Visit: Payer: Self-pay | Admitting: Nurse Practitioner

## 2023-01-09 DIAGNOSIS — F419 Anxiety disorder, unspecified: Secondary | ICD-10-CM | POA: Insufficient documentation

## 2023-01-09 DIAGNOSIS — I10 Essential (primary) hypertension: Secondary | ICD-10-CM | POA: Insufficient documentation

## 2023-01-09 DIAGNOSIS — K5792 Diverticulitis of intestine, part unspecified, without perforation or abscess without bleeding: Secondary | ICD-10-CM | POA: Insufficient documentation

## 2023-01-10 ENCOUNTER — Encounter: Payer: Self-pay | Admitting: Cardiology

## 2023-01-10 ENCOUNTER — Ambulatory Visit: Payer: 59 | Attending: Cardiology | Admitting: Cardiology

## 2023-01-10 VITALS — BP 122/76 | HR 56 | Ht 66.6 in | Wt 179.0 lb

## 2023-01-10 DIAGNOSIS — Z01810 Encounter for preprocedural cardiovascular examination: Secondary | ICD-10-CM | POA: Diagnosis not present

## 2023-01-10 DIAGNOSIS — I130 Hypertensive heart and chronic kidney disease with heart failure and stage 1 through stage 4 chronic kidney disease, or unspecified chronic kidney disease: Secondary | ICD-10-CM

## 2023-01-10 DIAGNOSIS — E782 Mixed hyperlipidemia: Secondary | ICD-10-CM

## 2023-01-10 DIAGNOSIS — I2510 Atherosclerotic heart disease of native coronary artery without angina pectoris: Secondary | ICD-10-CM

## 2023-01-10 DIAGNOSIS — N183 Chronic kidney disease, stage 3 unspecified: Secondary | ICD-10-CM

## 2023-01-10 NOTE — Progress Notes (Signed)
Cardiology Office Note:    Date:  01/10/2023   ID:  Geanie Cooley, DOB [birth year], MRN [id number]  PCP:  Lucky Cowboy, MD  Cardiologist:  Garwin Brothers, MD   Referring MD: Lucky Cowboy, MD    ASSESSMENT:    1. Preop cardiovascular exam   2. Coronary artery disease involving native coronary artery of native heart without angina pectoris   3. Benign hypertensive heart and CKD, stage 3 (GFR 30-59), w CHF (HCC)   4. Mixed dyslipidemia    PLAN:    In order of problems listed above:  Coronary artery disease: Preoperative cardiovascular evaluation: Secondary prevention stressed with the patient.  Importance of compliance with diet medication stressed and vocalized understanding.  He is asymptomatic but does not do much because of his knee issues.  Overall he is an active gentleman.  I recommended Lexiscan sestamibi.  If this is negative then he is not at high risk for coronary events during the aforementioned surgery.  Meticulous hemodynamic monitoring will further reduce the risk of coronary events.  Sublingual nitroglycerin prescription was sent, its protocol and 911 protocol explained and the patient vocalized understanding questions were answered to the patient's satisfaction Cardiac murmur: Echocardiogram will be done to assess murmur heard on auscultation. Essential hypertension: Blood pressure is stable and diet was emphasized. Mixed dyslipidemia: On lipid-lowering medications and followed by primary care.  Lipids were reviewed and discussed with him. There is history of atrial flutter in the chart.  Patient is not aware of this.  He is not keen on anticoagulation.  Benefits and potential risks explained and questions were answered to his satisfaction. Patient will be seen in follow-up appointment in 6 months or earlier if the patient has any concerns.    Medication Adjustments/Labs and Tests Ordered: Current medicines are reviewed at length with the patient today.   Concerns regarding medicines are outlined above.  Orders Placed This Encounter  Procedures   EKG 12-Lead   No orders of the defined types were placed in this encounter.    History of Present Illness:    Marvin Carter is a 67 y.o. male who is being seen today for the evaluation of coronary artery disease and to get established and preoperative cardiovascular evaluation at the request of Lucky Cowboy, MD. patient is a pleasant 67 year old male.  He has past medical history of coronary artery disease post stenting in the remote past.  He has been planning to undergo knee replacement surgery.  For this reason he is not very active because of his knee problems.  No chest pain orthopnea or PND.  He leads a sedentary lifestyle.  At the time of my evaluation, the patient is alert awake oriented and in no distress.  Past Medical History:  Diagnosis Date   Anxiety    Atrial flutter, paroxysmal (HCC) 01/04/2011   Benign hypertensive heart and CKD, stage 3 (GFR 30-59), w CHF (HCC) 10/13/2017   CAD (coronary artery disease), native coronary artery 01/04/2011   Diverticulitis    Diverticulosis    Drug-induced erectile dysfunction 03/01/2015   Essential hypertension    hypertension     GERD (gastroesophageal reflux disease)    Hemorrhoids, internal, with bleeding 11/08/2010   History of meniscectomy of right knee 01/02/2011   Hypertension    Medication management 04/30/2013   NSVT (nonsustained ventricular tachycardia) (HCC) 01/02/2011   Obesity (BMI 30.0-34.9) 01/10/2018   Other abnormal glucose (hx of prediabetes) 04/30/2013   5.7% in 2012, 5.8% in 2015  Primary osteoarthritis of right knee 04/03/2017   Prolapsed external hemorrhoids 10/19/2010   Spinal stenosis of lumbar region 07/28/2018   Stented coronary artery 01/04/2011   LAD DES Resolute.   Testosterone deficiency 04/30/2013   Vitamin D deficiency 04/30/2013    Past Surgical History:  Procedure Laterality Date    APPENDECTOMY     CARPAL TUNNEL RELEASE Right 2021   Dr. Janee Morn   COLONOSCOPY     CORONARY ANGIOPLASTY WITH STENT PLACEMENT  2012   ELBOW SURGERY Right 2021   Cyst excision/nerve decompression, Dr. Janee Morn   HAND SURGERY     right   KNEE ARTHROSCOPY     x 8 left   KNEE ARTHROSCOPY Right 04/21/2012   Procedure: ARTHROSCOPY KNEE;  Surgeon: Nilda Simmer, MD;  Location: [facility] OR;  Service: Orthopedics;  Laterality: Right;  medial and lateral meniscectomies   LEFT HEART CATHETERIZATION WITH CORONARY ANGIOGRAM N/A 01/03/2011   Procedure: LEFT HEART CATHETERIZATION WITH CORONARY ANGIOGRAM;  Surgeon: Runell Gess, MD;  [facility] CATH LAB;  Service: Cardiovascular;  Laterality: N/A;   PERCUTANEOUS CORONARY STENT INTERVENTION (PCI-S) N/A 01/03/2011   Procedure: PERCUTANEOUS CORONARY STENT INTERVENTION (PCI-S);  Surgeon: Runell Gess, MD;  Location: [facility] CATH LAB;  Service: Cardiovascular;  Laterality: N/A;   ROTATOR CUFF REPAIR     bilateral   TONSILLECTOMY     TOTAL KNEE ARTHROPLASTY Right 04/15/2017   TOTAL KNEE ARTHROPLASTY Right 04/15/2017   Procedure: TOTAL KNEE ARTHROPLASTY;  Surgeon: Salvatore Marvel, MD;  Location: [facility] OR;  Service: Orthopedics;  Laterality: Right;   TRICEPS TENDON REPAIR Bilateral     Current Medications: Current Meds  Medication Sig   acetaminophen (TYLENOL) 325 MG tablet Take 2 tablets (650 mg total) by mouth every 4 (four) hours as needed for mild pain ((score 1 to 3) or temp > 100.5).   atenolol (TENORMIN) 100 MG tablet Take  1 tablet  Daily for BP                                                                                               /                                                  TAKE                                                     BY                                                              MOUTH   atorvastatin (LIPITOR) 40 MG tablet Take  1 tablet  Daily  for Cholesterol                                                                      /                                            TAKE                                                     BY                                               MOUTH   buPROPion (WELLBUTRIN XL) 300 MG 24 hr tablet Take  1 tablet every Morning for Moos, Focus & Concentration                                                    /                                              TAKE                                               BY                                                   MOUTH   clopidogrel (PLAVIX) 75 MG tablet Take 1 tablet Daily to prevent Blood Clots   cyclobenzaprine (FLEXERIL) 10 MG tablet Take 1/2 to 1 tablet 3 x /day as needed for Muscle Spasm   diclofenac sodium (VOLTAREN) 1 % GEL Apply 2 to 4 grams 2 to 4 x / day   diltiazem (CARDIZEM LA) 240 MG 24 hr tablet Take 1 tablet every day for BP (Patient taking differently: Take 120 mg by mouth daily.)   doxazosin (CARDURA) 8 MG tablet TAKE 1 TABLET BY MOUTH AT BEDTIME FOR BLOOD PRESSURE OR PROSTATE (Patient taking differently: Take 4 mg by mouth daily. TAKE 1 TABLET BY MOUTH AT BEDTIME FOR BLOOD PRESSURE OR PROSTATE)   furosemide (LASIX) 80 MG tablet TAKE 1 TABLET BY MOUTH EVERY DAY FOR BLOOD PRESSURE AND FLUID RETENTION OR ANKLE SWELLING   Magnesium 400 MG CAPS Take 2 capsules by mouth daily.   meloxicam (MOBIC) 15 MG tablet  TAKE 1/2 TO 1 TABLET WITH FOOD DAILY FOR PAIN AND INFLAMMATION.LIMIT USE TO 4-5 DAYS A WEEK TO AVOID KIDNEY DAMAGE   Multiple Vitamin (MULTIVITAMIN WITH MINERALS) TABS Take 2 tablets by mouth daily. Mega Men Multivitamin.   nitroGLYCERIN (NITROSTAT) 0.4 MG SL tablet Dissolve 1 tablet under tongue every 3 to 5 minutes as needed for Chest Pain ( Do NOT take with Cialis )   olmesartan (BENICAR) 40 MG tablet Take 1/2  to 1 tablet Daily for BP   pantoprazole (PROTONIX) 40 MG tablet Take  1 tablet  Daily to Prevent Heartburn & Indigestion                                                                        /                          TAKE                                                           BY                                                        MOUTH   tadalafil (CIALIS) 20 MG tablet Take  1/2 to 1 tablet  every 2 to 3 days  as needed for XXXX   tamsulosin (FLOMAX) 0.4 MG CAPS capsule TAKE 1 CAPSULE BY MOUTH EVERY NIGHT AT BEDTIME FOR PROSTATE/URINE FLOW   traZODone (DESYREL) 150 MG tablet TAKE 1 TABLET(150 MG) BY MOUTH AT BEDTIME (Patient taking differently: Take 75 mg by mouth at bedtime.)     Allergies:   Hctz [hydrochlorothiazide] and Tape   Social History   Socioeconomic History   Marital status: Married    Spouse name: Not on file   Number of children: 2   Years of education: Not on file   Highest education level: Not on file  Occupational History   Occupation: Self Employed    Comment: Corporate treasurer: [employer]  Tobacco Use   Smoking status: Never   Smokeless tobacco: Never  Vaping Use   Vaping status: Never Used  Substance and Sexual Activity   Alcohol use: Yes    Alcohol/week: 10.0 standard drinks of alcohol    Types: 10 Cans of beer per week    Comment: 1-2 beers a night   Drug use: No   Sexual activity: Not on file  Other Topics Concern   Not on file  Social History Narrative   Not on file   Social Determinants of Health   Financial Resource Strain: Not on file  Food Insecurity: Not on file  Transportation Needs: Not on file  Physical Activity: Not on file  Stress: Not on file  Social Connections: Not on file     Family History: The patient's  family history includes Coronary artery disease in his father; Diabetes in his brother and mother; HIV/AIDS in his brother; Heart attack in his mother; Heart disease in his father and mother; Hypertension in his brother; Obesity in his brother. There is no history of Colon cancer, Esophageal cancer, Stomach cancer, or Rectal cancer.  ROS:   Please see the history of present illness.    All other systems reviewed and are  negative.  EKGs/Labs/Other Studies Reviewed:    The following studies were reviewed today:  EKG Interpretation Date/Time:  Thursday January 10 2023 13:04:51 EST Ventricular Rate:  56 PR Interval:    QRS Duration:  94 QT Interval:  394 QTC Calculation: 380 R Axis:   48  Text Interpretation: Junctional rhythm Possible Anterior infarct , age undetermined When compared with ECG of 15-Apr-2012 09:49, Junctional rhythm has replaced Sinus rhythm Borderline criteria for Anterior infarct are now Present Non-specific change in ST segment in Anterior leads Confirmed by Belva Crome [phone]) on 01/10/2023 1:20:52 PM     Recent Labs: 07/04/2022: TSH 0.85 10/25/2022: ALT 37; BUN 21; Creat 1.37; Magnesium 1.5; Potassium 4.2; Sodium 140 10/30/2022: Hemoglobin 14.3; Platelets 197  Recent Lipid Panel    Component Value Date/Time   CHOL 149 10/25/2022 1116   TRIG 191 (H) 10/25/2022 1116   HDL 64 10/25/2022 1116   CHOLHDL 2.3 10/25/2022 1116   VLDL 21 09/27/2016 1000   LDLCALC 59 10/25/2022 1116    Physical Exam:    VS:  BP 122/76   Pulse (!) 56   Ht 5' 6.6" (1.692 m)   Wt 179 lb 0.6 oz (81.2 kg)   SpO2 97%   BMI 28.38 kg/m     Wt Readings from Last 3 Encounters:  01/10/23 179 lb 0.6 oz (81.2 kg)  10/30/22 179 lb (81.2 kg)  10/25/22 174 lb 12.8 oz (79.3 kg)     GEN: Patient is in no acute distress HEENT: Normal NECK: No JVD; No carotid bruits LYMPHATICS: No lymphadenopathy CARDIAC: S1 S2 regular, 2/6 systolic murmur at the apex. RESPIRATORY:  Clear to auscultation without rales, wheezing or rhonchi  ABDOMEN: Soft, non-tender, non-distended MUSCULOSKELETAL:  No edema; No deformity  SKIN: Warm and dry NEUROLOGIC:  Alert and oriented x 3 PSYCHIATRIC:  Normal affect    Signed, Garwin Brothers, MD  01/10/2023 1:39 PM    [facility]

## 2023-01-10 NOTE — Patient Instructions (Signed)
Medication Instructions:  Your physician recommends that you continue on your current medications as directed. Please refer to the Current Medication list given to you today.   *If you need a refill on your cardiac medications before your next appointment, please call your pharmacy*   Lab Work: none If you have labs (blood work) drawn today and your tests are completely normal, you will receive your results only by: MyChart Message (if you have MyChart) OR A paper copy in the mail If you have any lab test that is abnormal or we need to change your treatment, we will call you to review the results.   Testing/Procedures:   [facility] Cardiovascular Imaging at [facility] [address] [place], [place] [address] Phone: [phone]   Please arrive 15 minutes prior to your appointment time for registration and insurance purposes.   The test will take approximately 3 to 4 hours to complete; you may bring reading material.  If someone comes with you to your appointment, they will need to remain in the main lobby due to limited space in the testing area.  How to prepare for your Myocardial Perfusion Test: Do not eat or drink 3 hours prior to your test, except you may have water. Do not consume products containing caffeine (regular or decaffeinated) 12 hours prior to your test. (ex: coffee, chocolate, sodas, tea). Do bring a list of your current medications with you.  If not listed below, you may take your medications as normal. Do wear comfortable clothes (no dresses or overalls) and walking shoes, tennis shoes preferred (No heels or open toe shoes are allowed). Do NOT wear cologne, perfume, aftershave, or lotions (deodorant is allowed). If these instructions are not followed, your test will have to be rescheduled.   Please report to [address] for your test.  If you have questions or concerns about your appointment, you can call the Nuclear Lab at  [phone].   If you cannot keep your appointment, please provide 24 hours notification to the Nuclear Lab, to avoid a possible $50 charge to your account.    Your physician has requested that you have an echocardiogram. Echocardiography is a painless test that uses sound waves to create images of your heart. It provides your doctor with information about the size and shape of your heart and how well your heart's chambers and valves are working. This procedure takes approximately one hour. There are no restrictions for this procedure. Please do NOT wear cologne, perfume, aftershave, or lotions (deodorant is allowed). Please arrive 15 minutes prior to your appointment time.  Please note: We ask at that you not bring children with you during ultrasound (echo/ vascular) testing. Due to room size and safety concerns, children are not allowed in the ultrasound rooms during exams. Our front office staff cannot provide observation of children in our lobby area while testing is being conducted. An adult accompanying a patient to their appointment will only be allowed in the ultrasound room at the discretion of the ultrasound technician under special circumstances. We apologize for any inconvenience.    Follow-Up: At [facility], you and your health needs are our priority.  As part of our continuing mission to provide you with exceptional heart care, we have created designated Provider Care Teams.  These Care Teams include your primary Cardiologist (physician) and Advanced Practice Providers (APPs -  Physician Assistants and Nurse Practitioners) who all work together to provide you with the care  you need, when you need it.  We recommend signing up for the patient portal called "MyChart".  Sign up information is provided on this After Visit Summary.  MyChart is used to connect with patients for Virtual Visits (Telemedicine).  Patients are able to view lab/test results, encounter notes, upcoming  appointments, etc.  Non-urgent messages can be sent to your provider as well.   To learn more about what you can do with MyChart, go to [email or web address].    Your next appointment:   9 month(s)  Provider:   Dr. [name]    Other Instructions  Cardiac Nuclear Scan A cardiac nuclear scan is a test that is done to check the flow of blood to your heart. It is done when you are resting and when you are exercising. The test looks for problems such as: Not enough blood reaching a portion of the heart. The heart muscle not working as it should. You may need this test if you have: Heart disease. Lab results that are not normal. Had heart surgery or a balloon procedure to open up blocked arteries (angioplasty) or a small mesh tube (stent). Chest pain. Shortness of breath. Had a heart attack. In this test, a special dye (tracer) is put into your bloodstream. The tracer will travel to your heart. A camera will then take pictures of your heart to see how the tracer moves through your heart. This test is usually done at a hospital and takes 2-4 hours. Tell a doctor about: Any allergies you have. All medicines you are taking, including vitamins, herbs, eye drops, creams, and over-the-counter medicines. Any bleeding problems you have. Any surgeries you have had. Any medical conditions you have. Whether you are pregnant or may be pregnant. Any history of asthma or long-term (chronic) lung disease. Any history of heart rhythm disorders or heart valve conditions. What are the risks? Your doctor will talk with you about risks. These may include: Serious chest pain and heart attack. This is only a risk if the stress portion of the test is done. Fast or uneven heartbeats (palpitations). A feeling of warmth in your chest. This feeling usually does not last long. Allergic reaction to the tracer. Shortness of breath or trouble breathing. What happens before the test? Ask your doctor about  changing or stopping your normal medicines. Follow instructions from your doctor about what you cannot eat or drink. Remove your jewelry on the day of the test. Ask your doctor if you need to avoid nicotine or caffeine. What happens during the test? An IV tube will be inserted into one of your veins. Your doctor will give you a small amount of tracer through the IV tube. You will wait for 20-40 minutes while the tracer moves through your bloodstream. Your heart will be monitored with an electrocardiogram (ECG). You will lie down on an exam table. Pictures of your heart will be taken for about 15-20 minutes. You may also have a stress test. For this test, one of these things may be done: You will be asked to exercise on a treadmill or a stationary bike. You will be given medicines that will make your heart work harder. This is done if you are unable to exercise. When blood flow to your heart has peaked, a tracer will again be given through the IV tube. After 20-40 minutes, you will get back on the exam table. More pictures will be taken of your heart. Depending on the tracer that is used, more pictures may need  to be taken 3-4 hours later. Your IV tube will be removed when the test is over. The test may vary among doctors and hospitals. What happens after the test? Ask your doctor: Whether you can return to your normal schedule, including diet, activities, travel, and medicines. Whether you should drink more fluids. This will help to remove the tracer from your body. Ask your doctor, or the department that is doing the test: When will my results be ready? How will I get my results? What are my treatment options? What other tests do I need? What are my next steps? This information is not intended to replace advice given to you by your health care provider. Make sure you discuss any questions you have with your health care provider. Document Revised: 07/04/2021 Document Reviewed:  07/04/2021 Elsevier Patient Education  2023 Elsevier Inc.  Echocardiogram An echocardiogram is a test that uses sound waves (ultrasound) to produce images of the heart. Images from an echocardiogram can provide important information about: Heart size and shape. The size and thickness and movement of your heart's walls. Heart muscle function and strength. Heart valve function or if you have stenosis. Stenosis is when the heart valves are too narrow. If blood is flowing backward through the heart valves (regurgitation). A tumor or infectious growth around the heart valves. Areas of heart muscle that are not working well because of poor blood flow or injury from a heart attack. Aneurysm detection. An aneurysm is a weak or damaged part of an artery wall. The wall bulges out from the normal force of blood pumping through the body. Tell a health care provider about: Any allergies you have. All medicines you are taking, including vitamins, herbs, eye drops, creams, and over-the-counter medicines. Any blood disorders you have. Any surgeries you have had. Any medical conditions you have. Whether you are pregnant or may be pregnant. What are the risks? Generally, this is a safe test. However, problems may occur, including an allergic reaction to dye (contrast) that may be used during the test. What happens before the test? No specific preparation is needed. You may eat and drink normally. What happens during the test?  You will take off your clothes from the waist up and put on a hospital gown. Electrodes or electrocardiogram (ECG)patches may be placed on your chest. The electrodes or patches are then connected to a device that monitors your heart rate and rhythm. You will lie down on a table for an ultrasound exam. A gel will be applied to your chest to help sound waves pass through your skin. A handheld device, called a transducer, will be pressed against your chest and moved over your heart.  The transducer produces sound waves that travel to your heart and bounce back (or "echo" back) to the transducer. These sound waves will be captured in real-time and changed into images of your heart that can be viewed on a video monitor. The images will be recorded on a computer and reviewed by your health care provider. You may be asked to change positions or hold your breath for a short time. This makes it easier to get different views or better views of your heart. In some cases, you may receive contrast through an IV in one of your veins. This can improve the quality of the pictures from your heart. The procedure may vary among health care providers and hospitals. What can I expect after the test? You may return to your normal, everyday life, including diet, activities,  and medicines, unless your health care provider tells you not to do that. Follow these instructions at home: It is up to you to get the results of your test. Ask your health care provider, or the department that is doing the test, when your results will be ready. Keep all follow-up visits. This is important. Summary An echocardiogram is a test that uses sound waves (ultrasound) to produce images of the heart. Images from an echocardiogram can provide important information about the size and shape of your heart, heart muscle function, heart valve function, and other possible heart problems. You do not need to do anything to prepare before this test. You may eat and drink normally. After the echocardiogram is completed, you may return to your normal, everyday life, unless your health care provider tells you not to do that. This information is not intended to replace advice given to you by your health care provider. Make sure you discuss any questions you have with your health care provider. Document Revised: 10/19/2020 Document Reviewed: 09/29/2019 Elsevier Patient Education  2023 Elsevier Inc.    Important Information About  [employer]

## 2023-01-11 ENCOUNTER — Ambulatory Visit (HOSPITAL_COMMUNITY): Payer: 59 | Attending: Cardiology

## 2023-01-11 ENCOUNTER — Other Ambulatory Visit (HOSPITAL_BASED_OUTPATIENT_CLINIC_OR_DEPARTMENT_OTHER): Payer: Self-pay

## 2023-01-11 DIAGNOSIS — I2510 Atherosclerotic heart disease of native coronary artery without angina pectoris: Secondary | ICD-10-CM | POA: Diagnosis present

## 2023-01-11 DIAGNOSIS — Z01810 Encounter for preprocedural cardiovascular examination: Secondary | ICD-10-CM | POA: Diagnosis present

## 2023-01-11 LAB — ECHOCARDIOGRAM COMPLETE
Area-P 1/2: 2.55 cm2
S' Lateral: 2.3 cm

## 2023-01-11 MED ORDER — COMIRNATY 30 MCG/0.3ML IM SUSY
0.3000 mL | PREFILLED_SYRINGE | Freq: Once | INTRAMUSCULAR | 0 refills | Status: AC
  Filled 2023-01-11: qty 0.3, 1d supply, fill #0

## 2023-01-14 ENCOUNTER — Encounter (HOSPITAL_COMMUNITY): Payer: 59

## 2023-01-15 ENCOUNTER — Telehealth (HOSPITAL_COMMUNITY): Payer: Self-pay | Admitting: *Deleted

## 2023-01-15 NOTE — Telephone Encounter (Signed)
Left detailed instructions for MPI study.

## 2023-01-20 ENCOUNTER — Encounter: Payer: Self-pay | Admitting: Internal Medicine

## 2023-01-21 ENCOUNTER — Ambulatory Visit (HOSPITAL_COMMUNITY): Payer: 59 | Attending: Cardiovascular Disease

## 2023-01-21 DIAGNOSIS — Z01810 Encounter for preprocedural cardiovascular examination: Secondary | ICD-10-CM | POA: Insufficient documentation

## 2023-01-21 DIAGNOSIS — I2510 Atherosclerotic heart disease of native coronary artery without angina pectoris: Secondary | ICD-10-CM | POA: Insufficient documentation

## 2023-01-21 LAB — MYOCARDIAL PERFUSION IMAGING
LV dias vol: 94 mL (ref 62–150)
LV sys vol: 36 mL
Nuc Stress EF: 62 %
Peak HR: 70 {beats}/min
Rest HR: 64 {beats}/min
Rest Nuclear Isotope Dose: 9.8 mCi
SDS: 2
SRS: 0
SSS: 2
ST Depression (mm): 0 mm
Stress Nuclear Isotope Dose: 31.4 mCi
TID: 0.96

## 2023-01-21 MED ORDER — FUROSEMIDE 80 MG PO TABS: ORAL_TABLET | ORAL | 3 refills | Status: AC

## 2023-01-21 MED ORDER — REGADENOSON 0.4 MG/5ML IV SOLN
0.4000 mg | Freq: Once | INTRAVENOUS | Status: AC
  Administered 2023-01-21: 0.4 mg via INTRAVENOUS

## 2023-01-21 MED ORDER — TECHNETIUM TC 99M TETROFOSMIN IV KIT
31.4000 | PACK | Freq: Once | INTRAVENOUS | Status: AC | PRN
  Administered 2023-01-21: 31.4 via INTRAVENOUS

## 2023-01-21 MED ORDER — TECHNETIUM TC 99M TETROFOSMIN IV KIT
9.8000 | PACK | Freq: Once | INTRAVENOUS | Status: AC | PRN
Start: 2023-01-21 — End: 2023-01-21
  Administered 2023-01-21: 9.8 via INTRAVENOUS

## 2023-01-23 ENCOUNTER — Encounter: Payer: Self-pay | Admitting: Cardiology

## 2023-01-27 ENCOUNTER — Encounter: Payer: Self-pay | Admitting: Internal Medicine

## 2023-01-27 NOTE — Progress Notes (Unsigned)
Marvin Carter      ADULT   &   ADOLESCENT      INTERNAL MEDICINE  Marvin Carter, M.D.          Rance Muir, ANP        Adela Glimpse, FNP  [facility] [address]  [place], [place]. [address] Telephone [phone] Telefax [phone]   Future Appointments  Date Time Provider Department  01/28/2023                   6 mo  10:30 AM Marvin Cowboy, MD [facility]  04/29/2023                   wellness 10:30 AM Adela Glimpse, NP [facility]  08/01/2023                   cpe 10:00 AM Marvin Cowboy, MD [facility]      History of Present Illness:       This very nice 67 y.o. MWM presents for 6 month follow up with HTN, HLD, Pre-Diabetes and Vitamin D Deficiency.          Patient is treated for HTN  (1989) & BP has been controlled at home. Today's BP is  at goal -                      .  Patient had a Negative Heart Cath in 2009 and in 2012 presented with ACS and had PCA/Stent.  Patient has CKD 3a  (GFR  57 ) attributed to his HTVD.  Patient has had no complaints of any cardiac type chest pain, palpitations, dyspnea Pollyann Kennedy /PND, dizziness, claudication or dependent edema .        Hyperlipidemia is controlled with diet & Atorvastatin. Patient denies myalgias or other med SE's. Last Lipids were at goal except sl elevated Trig's :  Lab Results  Component Value Date   CHOL 149 10/25/2022   HDL 64 10/25/2022   LDLCALC 59 10/25/2022   TRIG 191 (H) 10/25/2022   CHOLHDL 2.3 10/25/2022     Also, the patient has history of PreDiabetes (A1c 5.8% /2008) and has had no symptoms of reactive hypoglycemia, diabetic polys, paresthesias or visual blurring.  Last A1c was normal & at goal:  Lab Results  Component Value Date   HGBA1C 5.3 07/04/2022            Patient has hx/o Testosterone Deficiency & was on parenteral replacement, but was advised to stop for super high levels .                                                        Further, the  patient also has history of Vitamin D Deficiency  ("19" /2009) and supplements vitamin D without any suspected side-effects. Last vitamin D was at goal :  Lab Results  Component Value Date   VD25OH 93 10/25/2022       Current Outpatient Medications  Medication Instructions   acetaminophen 650 mg Every 4 hours PRN   atenolol  100 MG tablet Take  1 tablet  Daily    atorvastatin  40 MG tablet Take  1 tablet  Daily    buPROPion XL 300 MG  Take  1 tablet every Morning  clopidogrel 75 MG tablet Take 1 tablet Daily   cyclobenzaprine 10 MG tablet Take 1/2 to 1 tablet 3 x /day as needed    diclofenac sodium  1 % GEL Apply 2 to 4 grams 2 to 4 x /day   diltiazem LA 240 MG Take 1 tablet every day    doxazosin  8 MG tablet TAKE 1 TABLET  AT BEDTIME    furosemide 80 MG tablet TAKE 1 TABLET EVERY DAY    Magnesium 400 MG CAPS 2 capsules Daily   meloxicam  15 MG tablet TAKE 1/2 TO 1 TABLET DAILY    Multiple Vitamin  2 tablets Daily   NITROSTAT 0.4 MG SL as needed for Chest Pain    olmesartan 40 MG tablet Take 1/2-1 tablet Daily   pantoprazole  40 MG tablet Take  1 tablet  Daily    tadalafil  20 MG tablet Take  1/2 to 1 tab every 2-3 days  as needed    Tamsulosin 0.4 MG CAPS capsule TAKE 1 CAPSULE   AT BEDTIME    traZODone  150 MG tablet TAKE 1 TABLETAT BEDTIME     Allergies  Allergen Reactions   Hctz [Hydrochlorothiazide]    Tape Rash    Clear tape     PMHx:   Past Medical History:  Diagnosis Date   Anxiety    Atrial flutter, paroxysmal (HCC) 01/04/2011   CAD (coronary artery disease), native coronary artery 01/04/2011   Diverticulitis    Diverticulosis    GERD (gastroesophageal reflux disease)    History of meniscectomy of right knee 01/02/2011   Hypertension    NSVT (nonsustained ventricular tachycardia) (HCC) 01/02/2011   Primary osteoarthritis of right knee 04/03/2017   Stented coronary artery 01/04/2011   LAD DES Resolute.     Immunization History  Administered Date(s)  Administered   Influenza Split 12/16/2013, 12/02/2014   Influenza Whole 12/09/2012   Influenza 12/01/2015   Influenza 11/19/2016, 11/30/2017   PFIZER Covid-19 Vacc 07/26/2020   PFIZER SARS-COV-2 Vacc 05/21/2019, 06/12/2019   PPD Test 03/21/2017, 04/16/2018   Td 02/19/2005   Tdap 03/01/2015     Past Surgical History:  Procedure Laterality Date   APPENDECTOMY     CARPAL TUNNEL RELEASE Right 2021   Dr. Janee Morn   COLONOSCOPY     CORONARY ANGIOPLASTY WITH STENT PLACEMENT  2012   ELBOW SURGERY Right 2021   Cyst excision/nerve decompression, Dr. Janee Morn   HAND SURGERY     right   KNEE ARTHROSCOPY     x 8 left   KNEE ARTHROSCOPY Right 04/21/2012   Procedure: ARTHROSCOPY KNEE;  Surgeon: Nilda Simmer, MD;  Location: [facility] OR;  Service: Orthopedics;  Laterality: Right;  medial and lateral meniscectomies   LEFT HEART CATHETERIZATION WITH CORONARY ANGIOGRAM N/A 01/03/2011   Procedure: LEFT HEART CATHETERIZATION WITH CORONARY ANGIOGRAM;  Surgeon: Runell Gess, MD;  Location: [facility] CATH LAB;  Service: Cardiovascular;  Laterality: N/A;   PERCUTANEOUS CORONARY STENT INTERVENTION (PCI-S) N/A 01/03/2011   Procedure: PERCUTANEOUS CORONARY STENT INTERVENTION (PCI-S);  Surgeon: Runell Gess, MD;  Location: [facility] CATH LAB;  Service: Cardiovascular;  Laterality: N/A;   ROTATOR CUFF REPAIR     bilateral   TONSILLECTOMY     TOTAL KNEE ARTHROPLASTY Right 04/15/2017   TOTAL KNEE ARTHROPLASTY Right 04/15/2017   Procedure: TOTAL KNEE ARTHROPLASTY;  Surgeon: Salvatore Marvel, MD;  Location: [facility] OR;  Service: Orthopedics;  Laterality: Right;   TRICEPS TENDON REPAIR Bilateral     FHx:  Reviewed / unchanged  SHx:    Reviewed / unchanged    Systems Review:  Constitutional: Denies fever, chills, wt changes, headaches, insomnia, fatigue, night sweats, change in appetite. Eyes: Denies redness, blurred vision, diplopia, discharge, itchy, watery eyes.  ENT: Denies discharge, congestion, post nasal drip,  epistaxis, sore throat, earache, hearing loss, dental pain, tinnitus, vertigo, sinus pain, snoring.  CV: Denies chest pain, palpitations, irregular heartbeat, syncope, dyspnea, diaphoresis, orthopnea, PND, claudication or edema. Respiratory: denies cough, dyspnea, DOE, pleurisy, hoarseness, laryngitis, wheezing.  Gastrointestinal: Denies dysphagia, odynophagia, heartburn, reflux, water brash, abdominal pain or cramps, nausea, vomiting, bloating, diarrhea, constipation, hematemesis, melena, hematochezia  or hemorrhoids. Genitourinary: Denies dysuria, frequency, urgency, nocturia, hesitancy, discharge, hematuria or flank pain. Musculoskeletal: Denies arthralgias, myalgias, stiffness, jt. swelling, pain, limping or strain/sprain.  Skin: Denies pruritus, rash, hives, warts, acne, eczema or change in skin lesion(s). Neuro: No weakness, tremor, incoordination, spasms, paresthesia or pain. Psychiatric: Denies confusion, memory loss or sensory loss. Endo: Denies change in weight, skin or hair change.  Heme/Lymph: No excessive bleeding, bruising or enlarged lymph nodes.  Physical Exam  There were no vitals taken for this visit.  Appears  well nourished, well groomed  and in no distress.  Eyes: PERRLA, EOMs, conjunctiva no swelling or erythema. Sinuses: No frontal/maxillary tenderness ENT/Mouth: EAC's clear, TM's nl w/o erythema, bulging. Nares clear w/o erythema, swelling, exudates. Oropharynx clear without erythema or exudates. Oral hygiene is good. Tongue normal, non obstructing. Hearing intact.  Neck: Supple. Thyroid not palpable. Car 2+/2+ without bruits, nodes or JVD. Chest: Respirations nl with BS clear & equal w/o rales, rhonchi, wheezing or stridor.  Cor: Heart sounds normal w/ regular rate and rhythm without sig. murmurs, gallops, clicks or rubs. Peripheral pulses normal and equal  without edema.  Abdomen: Soft & bowel sounds normal. Non-tender w/o guarding, rebound, hernias, masses or  organomegaly.  Lymphatics: Unremarkable.  Musculoskeletal: Full ROM all peripheral extremities, joint stability, 5/5 strength and normal gait.  Skin: Warm, dry without exposed rashes, lesions or ecchymosis apparent.  Neuro: Cranial nerves intact, reflexes equal bilaterally. Sensory-motor testing grossly intact. Tendon reflexes grossly intact.  Pysch: Alert & oriented x 3.  Insight and judgement nl & appropriate. No ideations.  Assessment and Plan:   1. Essential hypertension  - CBC with Differential/Platelet - COMPLETE METABOLIC PANEL WITH GFR - Magnesium - TSH   2. Hyperlipidemia, mixed  - Lipid panel - TSH   3. Abnormal glucose  - Hemoglobin A1c - Insulin, random   4. Vitamin D deficiency  - VITAMIN D 25 Hydroxy   5. Chronic renal impairment, stage 3a (HCC)  - COMPLETE METABOLIC PANEL WITH GFR - Parathyroid hormone, intact (no Ca)   6. Medication management  - CBC with Differential/Platelet - COMPLETE METABOLIC PANEL WITH GFR - Magnesium - Lipid panel - TSH - Hemoglobin A1c - Insulin, random - VITAMIN D 25 Hydroxy          Discussed  regular exercise, BP monitoring, weight control to achieve/maintain BMI less than 25 and discussed med and SE's. Recommended labs to assess and monitor clinical status with further disposition pending results of labs.  I discussed the assessment and treatment plan with the patient. The patient was provided an opportunity to ask questions and all were answered. The patient agreed with the plan and demonstrated an understanding of the instructions.  I provided over 30 minutes of exam, counseling, chart review and  complex critical decision making.        The  patient was advised to call back or seek an in-person evaluation if the symptoms worsen or if the condition fails to improve as anticipated.   Marinus Maw, MD

## 2023-01-27 NOTE — Patient Instructions (Signed)

## 2023-01-28 ENCOUNTER — Encounter: Payer: Self-pay | Admitting: Internal Medicine

## 2023-01-28 ENCOUNTER — Ambulatory Visit (INDEPENDENT_AMBULATORY_CARE_PROVIDER_SITE_OTHER): Payer: 59 | Admitting: Internal Medicine

## 2023-01-28 VITALS — BP 128/80 | HR 63 | Temp 97.9°F | Resp 16 | Ht 66.6 in | Wt 177.6 lb

## 2023-01-28 DIAGNOSIS — I10 Essential (primary) hypertension: Secondary | ICD-10-CM | POA: Diagnosis not present

## 2023-01-28 DIAGNOSIS — N1831 Chronic kidney disease, stage 3a: Secondary | ICD-10-CM

## 2023-01-28 DIAGNOSIS — E559 Vitamin D deficiency, unspecified: Secondary | ICD-10-CM | POA: Diagnosis not present

## 2023-01-28 DIAGNOSIS — E782 Mixed hyperlipidemia: Secondary | ICD-10-CM | POA: Diagnosis not present

## 2023-01-28 DIAGNOSIS — R7309 Other abnormal glucose: Secondary | ICD-10-CM | POA: Diagnosis not present

## 2023-01-28 DIAGNOSIS — Z79899 Other long term (current) drug therapy: Secondary | ICD-10-CM

## 2023-01-29 LAB — CBC WITH DIFFERENTIAL/PLATELET
Absolute Lymphocytes: 1588 {cells}/uL (ref 850–3900)
Absolute Monocytes: 794 {cells}/uL (ref 200–950)
Basophils Absolute: 32 {cells}/uL (ref 0–200)
Basophils Relative: 0.5 %
Eosinophils Absolute: 189 {cells}/uL (ref 15–500)
Eosinophils Relative: 3 %
HCT: 38.8 % (ref 38.5–50.0)
Hemoglobin: 13.1 g/dL — ABNORMAL LOW (ref 13.2–17.1)
MCH: 33.7 pg — ABNORMAL HIGH (ref 27.0–33.0)
MCHC: 33.8 g/dL (ref 32.0–36.0)
MCV: 99.7 fL (ref 80.0–100.0)
MPV: 10 fL (ref 7.5–12.5)
Monocytes Relative: 12.6 %
Neutro Abs: 3698 {cells}/uL (ref 1500–7800)
Neutrophils Relative %: 58.7 %
Platelets: 236 10*3/uL (ref 140–400)
RBC: 3.89 10*6/uL — ABNORMAL LOW (ref 4.20–5.80)
RDW: 11.1 % (ref 11.0–15.0)
Total Lymphocyte: 25.2 %
WBC: 6.3 10*3/uL (ref 3.8–10.8)

## 2023-01-29 LAB — COMPLETE METABOLIC PANEL WITH GFR
AG Ratio: 1.7 (calc) (ref 1.0–2.5)
ALT: 33 U/L (ref 9–46)
AST: 29 U/L (ref 10–35)
Albumin: 4.4 g/dL (ref 3.6–5.1)
Alkaline phosphatase (APISO): 51 U/L (ref 35–144)
BUN: 20 mg/dL (ref 7–25)
CO2: 28 mmol/L (ref 20–32)
Calcium: 9.6 mg/dL (ref 8.6–10.3)
Chloride: 103 mmol/L (ref 98–110)
Creat: 1.18 mg/dL (ref 0.70–1.35)
Globulin: 2.6 g/dL (ref 1.9–3.7)
Glucose, Bld: 86 mg/dL (ref 65–99)
Potassium: 4.1 mmol/L (ref 3.5–5.3)
Sodium: 140 mmol/L (ref 135–146)
Total Bilirubin: 0.9 mg/dL (ref 0.2–1.2)
Total Protein: 7 g/dL (ref 6.1–8.1)
eGFR: 67 mL/min/{1.73_m2} (ref >=60)

## 2023-01-29 LAB — LIPID PANEL
Cholesterol: 134 mg/dL (ref <200)
HDL: 50 mg/dL (ref >=40)
LDL Cholesterol (Calc): 62 mg/dL
Non-HDL Cholesterol (Calc): 84 mg/dL (ref <130)
Total CHOL/HDL Ratio: 2.7 (calc) (ref <5.0)
Triglycerides: 135 mg/dL (ref <150)

## 2023-01-29 LAB — PARATHYROID HORMONE, INTACT (NO CA): PTH: 18 pg/mL (ref 16–77)

## 2023-01-29 LAB — TSH: TSH: 0.98 m[IU]/L (ref 0.40–4.50)

## 2023-01-29 LAB — HEMOGLOBIN A1C
Hgb A1c MFr Bld: 5.3 %{Hb} (ref <5.7)
Mean Plasma Glucose: 105 mg/dL
eAG (mmol/L): 5.8 mmol/L

## 2023-01-29 LAB — MAGNESIUM: Magnesium: 1.6 mg/dL (ref 1.5–2.5)

## 2023-01-29 LAB — INSULIN, RANDOM: Insulin: 6.8 u[IU]/mL

## 2023-01-29 LAB — VITAMIN D 25 HYDROXY (VIT D DEFICIENCY, FRACTURES): Vit D, 25-Hydroxy: 81 ng/mL (ref 30–100)

## 2023-01-29 NOTE — Progress Notes (Signed)
[][][][][][][][][][][][][][][][][][][][][][][][][][][][][][][][][][][][][][][][][]][][][][][][][][][][][][][][][][][][][][][][][[][][][][] [][][][][][][][][][][][][][][][][][][][][][][][][][][][][][][][][][][][][][][][][]][][][][][][][][][][][][][][][][][][][][][][][[][][][][] -  Test results slightly outside the reference range are not unusual. If there is anything important, I will review this with you,  otherwise it is considered normal test values.  If you have further questions,  please do not hesitate to contact me at the office or via My Chart.  [] [] [] [] [] [] [] [] [] [] [] [] [] [] [] [] [] [] [] [] [] [] [] [] [] [] [] [] [] [] [] [] [] [] [] [] [] [] [] [] [] ][] [] [] [] [] [] [] [] [] [] [] [] [] [] [] [] [] [] [] [] [] [] [[] [] [] [] []   - Kidney functions appear Improved - Great !  [] [] [] [] [] [] [] [] [] [] [] [] [] [] [] [] [] [] [] [] [] [] [] [] [] [] [] [] [] [] [] [] [] [] [] [] [] [] [] [] [] ][] [] [] [] [] [] [] [] [] [] [] [] [] [] [] [] [] [] [] [] [] [] [[] [] [] [] []   -   Magnesium  = 1.6  is very  low- goal is betw 2.0 - 2.5,   - So..............Marland Kitchen  Recommend that you take  Magnesium 500 mg tablet 3 x / day with meals   - also important to eat lots of  leafy green vegetables   - spinach - Kale - collards - greens - okra - asparagus  - broccoli - quinoa - squash - almonds   - black, red, white beans -  peas - green beans  [] [] [] [] [] [] [] [] [] [] [] [] [] [] [] [] [] [] [] [] [] [] [] [] [] [] [] [] [] [] [] [] [] [] [] [] [] [] [] [] [] ][] [] [] [] [] [] [] [] [] [] [] [] [] [] [] [] [] [] [] [] [] [] [[] [] [] [] []   -  Chol = 134  - Excellent   - Very low risk for Heart Attack  / Stroke  [] [] [] [] [] [] [] [] [] [] [] [] [] [] [] [] [] [] [] [] [] [] [] [] [] [] [] [] [] [] [] [] [] [] [] [] [] [] [] [] [] ][] [] [] [] [] [] [] [] [] [] [] [] [] [] [] [] [] [] [] [] [] [] [[] [] [] [] []   -  All Else - CBC - Kidneys - Electrolytes - Liver - Magnesium & Thyroid    - all  Normal / OK  [] [] [] [] [] [] [] [] [] [] [] [] [] [] [] [] [] [] [] [] [] [] [] [] [] [] [] [] [] [] [] [] [] [] [] [] [] [] [] [] [] ][] [] [] [] [] [] [] [] [] [] [] [] [] [] [] [] [] [] [] [] [] [] [[] [] [] [] []   -  Keep up the [place] Work     [] [] [] [] [] [] [] [] [] [] [] [] [] [] [] [] [] [] [] [] [] [] [] [] [] [] [] [] [] [] [] [] [] [] [] [] [] [] [] [] [] ][] [] [] [] [] [] [] [] [] [] [] [] [] [] [] [] [] [] [] [] [] [] [[] [] [] [] []           -

## 2023-01-30 ENCOUNTER — Encounter: Payer: Self-pay | Admitting: Internal Medicine

## 2023-02-04 ENCOUNTER — Encounter: Payer: Self-pay | Admitting: Internal Medicine

## 2023-02-04 ENCOUNTER — Other Ambulatory Visit: Payer: Self-pay | Admitting: Internal Medicine

## 2023-02-04 DIAGNOSIS — I10 Essential (primary) hypertension: Secondary | ICD-10-CM

## 2023-02-04 DIAGNOSIS — N138 Other obstructive and reflux uropathy: Secondary | ICD-10-CM

## 2023-02-04 MED ORDER — TAMSULOSIN HCL 0.4 MG PO CAPS: ORAL_CAPSULE | ORAL | 3 refills | Status: AC

## 2023-02-04 MED ORDER — TRAZODONE HCL 150 MG PO TABS: ORAL_TABLET | ORAL | 3 refills | Status: DC

## 2023-02-04 MED ORDER — DOXAZOSIN MESYLATE 8 MG PO TABS: ORAL_TABLET | ORAL | 3 refills | Status: AC

## 2023-02-04 MED ORDER — DILTIAZEM HCL ER 240 MG PO TB24: ORAL_TABLET | ORAL | Status: AC

## 2023-02-22 ENCOUNTER — Telehealth: Payer: Self-pay | Admitting: *Deleted

## 2023-02-22 NOTE — Telephone Encounter (Signed)
 Question sent to me today from surgery scheduler regarding holding Plavix  for procedure.  Per record review, mLAD stent length from 2012 does not exceed 25 mm.   Per office protocol, he may hold Plavix  for 5 days prior to procedure and should resume as soon as hemodynamically stable postoperatively.  Rosaline EMERSON Bane, NP-C  02/22/2023, 11:28 AM [address] Office [phone] Fax [phone]

## 2023-03-03 ENCOUNTER — Encounter: Payer: Self-pay | Admitting: Nurse Practitioner

## 2023-03-03 DIAGNOSIS — I2510 Atherosclerotic heart disease of native coronary artery without angina pectoris: Secondary | ICD-10-CM

## 2023-03-03 MED ORDER — CLOPIDOGREL BISULFATE 75 MG PO TABS: ORAL_TABLET | ORAL | 3 refills | Status: AC

## 2023-03-09 ENCOUNTER — Other Ambulatory Visit: Payer: Self-pay | Admitting: Nurse Practitioner

## 2023-03-10 ENCOUNTER — Other Ambulatory Visit: Payer: Self-pay | Admitting: Internal Medicine

## 2023-03-10 DIAGNOSIS — I10 Essential (primary) hypertension: Secondary | ICD-10-CM

## 2023-03-10 DIAGNOSIS — F341 Dysthymic disorder: Secondary | ICD-10-CM

## 2023-03-11 ENCOUNTER — Other Ambulatory Visit: Payer: Self-pay

## 2023-03-11 DIAGNOSIS — E782 Mixed hyperlipidemia: Secondary | ICD-10-CM

## 2023-03-11 MED ORDER — ATORVASTATIN CALCIUM 40 MG PO TABS: ORAL_TABLET | ORAL | 3 refills | Status: AC

## 2023-03-13 ENCOUNTER — Ambulatory Visit: Payer: Self-pay | Admitting: Emergency Medicine

## 2023-03-13 DIAGNOSIS — G8929 Other chronic pain: Secondary | ICD-10-CM

## 2023-03-13 NOTE — Progress Notes (Signed)
Sent message, via epic in basket, requesting orders in epic from surgeon.  

## 2023-03-13 NOTE — H&P (Signed)
TOTAL KNEE ADMISSION H&P  Patient is being admitted for left total knee arthroplasty.  Subjective:  Chief Complaint:left knee pain.  HPI: Marvin Carter, 68 y.o. male, has a history of pain and functional disability in the left knee due to arthritis and has failed non-surgical conservative treatments for greater than 12 weeks to includeNSAID's and/or analgesics, corticosteriod injections, supervised PT with diminished ADL's post treatment, use of assistive devices, and activity modification.  Onset of symptoms was gradual, starting >10 years ago with gradually worsening course since that time. The patient noted prior procedures on the knee to include  arthroscopy on the left knee(s).  Patient currently rates pain in the left knee(s) at 10 out of 10 with activity. Patient has night pain, worsening of pain with activity and weight bearing, pain that interferes with activities of daily living, and pain with passive range of motion.  Patient has evidence of periarticular osteophytes and joint space narrowing by imaging studies. There is no active infection.  Patient Active Problem List   Diagnosis Date Noted   Mixed dyslipidemia 01/10/2023   Anxiety    Diverticulitis    Hypertension    Spinal stenosis of lumbar region 07/28/2018   Obesity (BMI 30.0-34.9) 01/10/2018   Benign hypertensive heart and CKD, stage 3 (GFR 30-59), w CHF (HCC) 10/13/2017   Primary osteoarthritis of right knee 04/03/2017   Drug-induced erectile dysfunction 03/01/2015   Other abnormal glucose (hx of prediabetes) 04/30/2013   Vitamin D deficiency 04/30/2013   Medication management 04/30/2013   Testosterone deficiency 04/30/2013   Essential hypertension    GERD (gastroesophageal reflux disease)    Diverticulosis    Atrial flutter, paroxysmal (HCC) 01/04/2011   CAD (coronary artery disease), native coronary artery 01/04/2011   Stented coronary artery 01/04/2011   NSVT (nonsustained ventricular tachycardia) (HCC)  01/02/2011   History of meniscectomy of right knee 01/02/2011   Hemorrhoids, internal, with bleeding 11/08/2010   Prolapsed external hemorrhoids 10/19/2010   Past Medical History:  Diagnosis Date   Anxiety    Atrial flutter, paroxysmal (HCC) 01/04/2011   Benign hypertensive heart and CKD, stage 3 (GFR 30-59), w CHF (HCC) 10/13/2017   CAD (coronary artery disease), native coronary artery 01/04/2011   Diverticulitis    Diverticulosis    Drug-induced erectile dysfunction 03/01/2015   Essential hypertension    hypertension     GERD (gastroesophageal reflux disease)    Hemorrhoids, internal, with bleeding 11/08/2010   History of meniscectomy of right knee 01/02/2011   Hypertension    Medication management 04/30/2013   NSVT (nonsustained ventricular tachycardia) (HCC) 01/02/2011   Obesity (BMI 30.0-34.9) 01/10/2018   Other abnormal glucose (hx of prediabetes) 04/30/2013   5.7% in 2012, 5.8% in 2015     Primary osteoarthritis of right knee 04/03/2017   Prolapsed external hemorrhoids 10/19/2010   Spinal stenosis of lumbar region 07/28/2018   Stented coronary artery 01/04/2011   LAD DES Resolute.   Testosterone deficiency 04/30/2013   Vitamin D deficiency 04/30/2013    Past Surgical History:  Procedure Laterality Date   APPENDECTOMY     CARPAL TUNNEL RELEASE Right 2021   Dr. Janee Morn   COLONOSCOPY     CORONARY ANGIOPLASTY WITH STENT PLACEMENT  2012   ELBOW SURGERY Right 2021   Cyst excision/nerve decompression, Dr. Janee Morn   HAND SURGERY     right   KNEE ARTHROSCOPY     x 8 left   KNEE ARTHROSCOPY Right 04/21/2012   Procedure: ARTHROSCOPY KNEE;  Surgeon: Nilda Simmer,  MD;  Location: [facility] OR;  Service: Orthopedics;  Laterality: Right;  medial and lateral meniscectomies   LEFT HEART CATHETERIZATION WITH CORONARY ANGIOGRAM N/A 01/03/2011   Procedure: LEFT HEART CATHETERIZATION WITH CORONARY ANGIOGRAM;  Surgeon: Runell Gess, MD;  Location[facility] CATH LAB;  Service:  Cardiovascular;  Laterality: N/A;   PERCUTANEOUS CORONARY STENT INTERVENTION (PCI-S) N/A 01/03/2011   Procedure: PERCUTANEOUS CORONARY STENT INTERVENTION (PCI-S);  Surgeon: Runell Gess, MD;  Location: [facility] CATH LAB;  Service: Cardiovascular;  Laterality: N/A;   ROTATOR CUFF REPAIR     bilateral   TONSILLECTOMY     TOTAL KNEE ARTHROPLASTY Right 04/15/2017   TOTAL KNEE ARTHROPLASTY Right 04/15/2017   Procedure: TOTAL KNEE ARTHROPLASTY;  Surgeon: Salvatore Marvel, MD;  Location: [facility] OR;  Service: Orthopedics;  Laterality: Right;   TRICEPS TENDON REPAIR Bilateral     Current Outpatient Medications  Medication Sig Dispense Refill Last Dose/Taking   acetaminophen (TYLENOL) 325 MG tablet Take 2 tablets (650 mg total) by mouth every 4 (four) hours as needed for mild pain ((score 1 to 3) or temp > 100.5).      atenolol (TENORMIN) 100 MG tablet TAKE 1 TABLET BY MOUTH DAILY FOR BLOOD PRESSURE 90 tablet 3    atorvastatin (LIPITOR) 40 MG tablet Take  1 tablet  Daily  for Cholesterol                                                                      /                                           TAKE                                                     BY                                               MOUTH 90 tablet 3    buPROPion (WELLBUTRIN XL) 300 MG 24 hr tablet TAKE 1 TABLET BY MOUTH EVERY MORNING FOR MOOD,FOCUS AND CONCENTRATION 90 tablet 3    clopidogrel (PLAVIX) 75 MG tablet Take 1 tablet Daily to prevent Blood Clots 90 tablet 3    cyclobenzaprine (FLEXERIL) 10 MG tablet Take 1/2 to 1 tablet 3 x /day as needed for Muscle Spasm 90 tablet 3    diclofenac sodium (VOLTAREN) 1 % GEL Apply 2 to 4 grams 2 to 4 x / day 100 g 11    diltiazem (CARDIZEM LA) 240 MG 24 hr tablet Take 1/2 tablet (120 mg) every day for BP                                                /  TAKE                                         BY                                                  MOUTH      doxazosin (CARDURA) 8 MG tablet Take  1/2 tablet (4 mg)  at Bedtime  for BP & Prostate                                                /                                                                   TAKE                                         BY                                                 MOUTH 45 tablet 3    furosemide (LASIX) 80 MG tablet TAKE 1 TABLET BY MOUTH EVERY DAY FOR BLOOD PRESSURE AND FLUID RETENTION OR ANKLE SWELLING 90 tablet 3    Magnesium 400 MG CAPS Take 2 capsules by mouth daily.      meloxicam (MOBIC) 15 MG tablet TAKE 1/2 TO 1 TABLET WITH FOOD DAILY FOR PAIN AND INFLAMMATION.LIMIT USE TO 4-5 DAYS A WEEK TO AVOID KIDNEY DAMAGE 90 tablet 3    Multiple Vitamin (MULTIVITAMIN WITH MINERALS) TABS Take 2 tablets by mouth daily. Mega Men Multivitamin.      nitroGLYCERIN (NITROSTAT) 0.4 MG SL tablet Dissolve 1 tablet under tongue every 3 to 5 minutes as needed for Chest Pain ( Do NOT take with Cialis ) 25 tablet 1    olmesartan (BENICAR) 40 MG tablet Take 1/2  to 1 tablet Daily for BP 90 tablet 3    pantoprazole (PROTONIX) 40 MG tablet Take  1 tablet  Daily to Prevent Heartburn & Indigestion                                                                        /                          TAKE  BY                                                        MOUTH 90 tablet 3    tadalafil (CIALIS) 20 MG tablet Take  1/2 to 1 tablet  every 2 to 3 days  as needed for XXXX 30 tablet 3    tamsulosin (FLOMAX) 0.4 MG CAPS capsule Take 2 capsules (0.8 mg) at Bedtime for Prostate                                 /                                                                   TAKE                                         BY                                                 MOUTH 180 capsule 3    traZODone (DESYREL) 150 MG tablet TAKE 1 TABLET(150 MG) BY MOUTH AT BEDTIME 30 tablet 2    No current facility-administered  medications for this visit.   Allergies  Allergen Reactions   Hctz [Hydrochlorothiazide]    Tape Rash    Clear tape    Social History   Tobacco Use   Smoking status: Never   Smokeless tobacco: Never  Substance Use Topics   Alcohol use: Yes    Alcohol/week: 10.0 standard drinks of alcohol    Types: 10 Cans of beer per week    Comment: 1-2 beers a night    Family History  Problem Relation Age of Onset   Diabetes Mother    Heart disease Mother    Heart attack Mother    Heart disease Father    Coronary artery disease Father    Obesity Brother    Diabetes Brother    HIV/AIDS Brother    Hypertension Brother    Colon cancer Neg Hx    Esophageal cancer Neg Hx    Stomach cancer Neg Hx    Rectal cancer Neg Hx      Review of Systems  Musculoskeletal:  Positive for arthralgias.  All other systems reviewed and are negative.   Objective:  Physical Exam Constitutional:      General: He is not in acute distress.    Appearance: Normal appearance. He is normal weight.  HENT:     Head: Normocephalic and atraumatic.  Eyes:     Extraocular Movements: Extraocular movements intact.     Conjunctiva/sclera: Conjunctivae normal.     Pupils: Pupils are equal, round, and reactive to light.  Cardiovascular:     Rate and Rhythm: Normal rate and regular rhythm.  Pulses: Normal pulses.     Heart sounds: Normal heart sounds.  Pulmonary:     Effort: Pulmonary effort is normal. No respiratory distress.     Breath sounds: Normal breath sounds.  Abdominal:     General: Bowel sounds are normal. There is no distension.     Palpations: Abdomen is soft.     Tenderness: There is no abdominal tenderness.  Musculoskeletal:        General: Tenderness present.     Cervical back: Normal range of motion and neck supple.     Comments: TTP over medial and lateral joint line, medial worse than lateral.  No calf tenderness, swelling, or erythema.  No overlying lesions of area of chief complaint.   Decreased strength and ROM due to elicited pain.  Pre-operative ROM 5-90.  Dorsiflexion and plantarflexion intact.  Stable to varus and valgus stress.  BLE appear grossly neurovascularly intact.  Gait mildly antalgic.   Lymphadenopathy:     Cervical: No cervical adenopathy.  Skin:    General: Skin is warm and dry.     Capillary Refill: Capillary refill takes less than 2 seconds.     Findings: No erythema or rash.  Neurological:     General: No focal deficit present.     Mental Status: He is alert and oriented to person, place, and time.  Psychiatric:        Mood and Affect: Mood normal.        Behavior: Behavior normal.     Vital signs in last 24 hours: @VSRANGES @  Labs:   Estimated body mass index is 28.15 kg/m as calculated from the following:   Height as of 01/28/23: 5' 6.6" (1.692 m).   Weight as of 01/28/23: 80.6 kg.   Imaging Review Plain radiographs demonstrate severe degenerative joint disease of the left knee(s). The overall alignment is generally neutral . The bone quality appears to be fair for age and reported activity level.      Assessment/Plan:  End stage arthritis, left knee   The patient history, physical examination, clinical judgment of the provider and imaging studies are consistent with end stage degenerative joint disease of the left knee(s) and total knee arthroplasty is deemed medically necessary. The treatment options including medical management, injection therapy arthroscopy and arthroplasty were discussed at length. The risks and benefits of total knee arthroplasty were presented and reviewed. The risks due to aseptic loosening, infection, stiffness, patella tracking problems, thromboembolic complications and other imponderables were discussed. The patient acknowledged the explanation, agreed to proceed with the plan and consent was signed. Patient is being admitted for inpatient treatment for surgery, pain control, PT, OT, prophylactic antibiotics,  VTE prophylaxis, progressive ambulation and ADL's and discharge planning. The patient is planning to be discharged home with outpatient PT.   Anticipated LOS equal to or greater than 2 midnights due to - Age 64 and older with one or more of the following:  - Obesity  - Expected need for hospital services (PT, OT, Nursing) required for safe  discharge  - Anticipated need for postoperative skilled nursing care or inpatient rehab  - Active co-morbidities: CAD, HTN,HLD, CKD stage III, GERD, NSVT, anxiety, cardiac stent placement 2012, diverticulitis, BPH OR   - Unanticipated findings during/Post Surgery: None  - Patient is a high risk of re-admission due to: None

## 2023-03-19 NOTE — Patient Instructions (Addendum)
SURGICAL WAITING ROOM VISITATION Patients having surgery or a procedure may have no more than 2 support people in the waiting area - these visitors may rotate.    Children under the age of 5 must have an adult with them who is not the patient.  Due to an increase in RSV and influenza rates and associated hospitalizations, children ages 42 and under may not visit patients in [facility] hospitals.   If the patient needs to stay at the hospital during part of their recovery, the visitor guidelines for inpatient rooms apply. Pre-op nurse will coordinate an appropriate time for 1 support person to accompany patient in pre-op.  This support person may not rotate.    Please refer to the [facility] website for the visitor guidelines for Inpatients (after your surgery is over and you are in a regular room).       Your procedure is scheduled on: 04-01-23   Report to [facility] Main Entrance    Report to admitting at 10:30 AM   Call this number if you have problems the morning of surgery [phone]   Do not eat food :After Midnight.   After Midnight you may have the following liquids until 10:00 AM DAY OF SURGERY  Water Non-Citrus Juices (without pulp, NO RED-Apple, White grape, White cranberry) Black Coffee (NO MILK/CREAM OR CREAMERS, sugar ok)  Clear Tea (NO MILK/CREAM OR CREAMERS, sugar ok) regular and decaf                             Plain Jell-O (NO RED)                                           Fruit ices (not with fruit pulp, NO RED)                                     Popsicles (NO RED)                                                               Sports drinks like Gatorade (NO RED)                   The day of surgery:  Drink ONE (1) Pre-Surgery Clear Ensure by 10:00 AM the morning of surgery. Drink in one sitting. Do not sip.  This drink was given to you during your hospital  pre-op appointment visit. Nothing else to drink after completing the Pre-Surgery  Clear Ensure.          If you have questions, please contact your surgeon's office.   FOLLOW  ANY ADDITIONAL PRE OP INSTRUCTIONS YOU RECEIVED FROM YOUR SURGEON'S OFFICE!!!     Oral Hygiene is also important to reduce your risk of infection.                                    Remember - BRUSH YOUR TEETH THE MORNING OF SURGERY WITH YOUR REGULAR TOOTHPASTE  Do NOT smoke after Midnight   Take these medicines the morning of surgery with A SIP OF WATER:   Atenolol  Atorvastatin  Bupropion  Diltiazem  Pantoprazole  Tylenol if needed  Stop all vitamins and herbal supplements 7 days before surgery  Bring CPAP mask and tubing day of surgery.                              You may not have any metal on your body including  jewelry, and body piercing             Do not wear  lotions, powders, cologne, or deodorant              Men may shave face and neck.   Do not bring valuables to the hospital. [facility] IS NOT RESPONSIBLE   FOR VALUABLES.   Contacts, dentures or bridgework may not be worn into surgery.   Bring small overnight bag day of surgery.   DO NOT BRING YOUR HOME MEDICATIONS TO THE HOSPITAL. PHARMACY WILL DISPENSE MEDICATIONS LISTED ON YOUR MEDICATION LIST TO YOU DURING YOUR ADMISSION IN THE HOSPITAL!   Special Instructions: Bring a copy of your healthcare power of attorney and living will documents the day of surgery if you haven't scanned them before.              Please read over the following fact sheets you were given: IF YOU HAVE QUESTIONS ABOUT YOUR PRE-OP INSTRUCTIONS PLEASE CALL [phone] G[facility]  If you received a COVID test during your pre-op visit  it is requested that you wear a mask when out in public, stay away from anyone that may not be feeling well and notify your surgeon if you develop symptoms. If you test positive for Covid or have been in contact with anyone that has tested positive in the last 10 days please notify you surgeon.    Pre-operative 5  CHG Bath Instructions   You can play a key role in reducing the risk of infection after surgery. Your skin needs to be as free of germs as possible. You can reduce the number of germs on your skin by washing with CHG (chlorhexidine gluconate) soap before surgery. CHG is an antiseptic soap that kills germs and continues to kill germs even after washing.   DO NOT use if you have an allergy to chlorhexidine/CHG or antibacterial soaps. If your skin becomes reddened or irritated, stop using the CHG and notify one of our RNs at [phone].   Please shower with the CHG soap starting 4 days before surgery using the following schedule:     Please keep in mind the following:  DO NOT shave, including legs and underarms, starting the day of your first shower.   You may shave your face at any point before/day of surgery.  Place clean sheets on your bed the day you start using CHG soap. Use a clean washcloth (not used since being washed) for each shower. DO NOT sleep with pets once you start using the CHG.   CHG Shower Instructions:  If you choose to wash your hair and private area, wash first with your normal shampoo/soap.  After you use shampoo/soap, rinse your hair and body thoroughly to remove shampoo/soap residue.  Turn the water OFF and apply about 3 tablespoons (45 ml) of CHG soap to a CLEAN washcloth.  Apply CHG soap ONLY FROM YOUR NECK DOWN TO YOUR TOES (  washing for 3-5 minutes)  DO NOT use CHG soap on face, private areas, open wounds, or sores.  Pay special attention to the area where your surgery is being performed.  If you are having back surgery, having someone wash your back for you may be helpful. Wait 2 minutes after CHG soap is applied, then you may rinse off the CHG soap.  Pat dry with a clean towel  Put on clean clothes/pajamas   If you choose to wear lotion, please use ONLY the CHG-compatible lotions on the back of this paper.     Additional instructions for the day of  surgery: DO NOT APPLY any lotions, deodorants, cologne, or perfumes.   Put on clean/comfortable clothes.  Brush your teeth.  Ask your nurse before applying any prescription medications to the skin.      CHG Compatible Lotions   Aveeno Moisturizing lotion  Cetaphil Moisturizing Cream  Cetaphil Moisturizing Lotion  Clairol Herbal Essence Moisturizing Lotion, Dry Skin  Clairol Herbal Essence Moisturizing Lotion, Extra Dry Skin  Clairol Herbal Essence Moisturizing Lotion, Normal Skin  Curel Age Defying Therapeutic Moisturizing Lotion with Alpha Hydroxy  Curel Extreme Care Body Lotion  Curel Soothing Hands Moisturizing Hand Lotion  Curel Therapeutic Moisturizing Cream, Fragrance-Free  Curel Therapeutic Moisturizing Lotion, Fragrance-Free  Curel Therapeutic Moisturizing Lotion, Original Formula  Eucerin Daily Replenishing Lotion  Eucerin Dry Skin Therapy Plus Alpha Hydroxy Crme  Eucerin Dry Skin Therapy Plus Alpha Hydroxy Lotion  Eucerin Original Crme  Eucerin Original Lotion  Eucerin Plus Crme Eucerin Plus Lotion  Eucerin TriLipid Replenishing Lotion  Keri Anti-Bacterial Hand Lotion  Keri Deep Conditioning Original Lotion Dry Skin Formula Softly Scented  Keri Deep Conditioning Original Lotion, Fragrance Free Sensitive Skin Formula  Keri Lotion Fast Absorbing Fragrance Free Sensitive Skin Formula  Keri Lotion Fast Absorbing Softly Scented Dry Skin Formula  Keri Original Lotion  Keri Skin Renewal Lotion Keri Silky Smooth Lotion  Keri Silky Smooth Sensitive Skin Lotion  Nivea Body Creamy Conditioning Oil  Nivea Body Extra Enriched Lotion  Nivea Body Original Lotion  Nivea Body Sheer Moisturizing Lotion Nivea Crme  Nivea Skin Firming Lotion  NutraDerm 30 Skin Lotion  NutraDerm Skin Lotion  NutraDerm Therapeutic Skin Cream  NutraDerm Therapeutic Skin Lotion  ProShield Protective Hand Cream  Provon moisturizing lotion   PATIENT  SIGNATURE_________________________________  NURSE SIGNATURE__________________________________  ________________________________________________________________________    Marvin Carter  An incentive spirometer is a tool that can help keep your lungs clear and active. This tool measures how well you are filling your lungs with each breath. Taking long deep breaths may help reverse or decrease the chance of developing breathing (pulmonary) problems (especially infection) following: A long period of time when you are unable to move or be active. BEFORE THE PROCEDURE  If the spirometer includes an indicator to show your best effort, your nurse or respiratory therapist will set it to a desired goal. If possible, sit up straight or lean slightly forward. Try not to slouch. Hold the incentive spirometer in an upright position. INSTRUCTIONS FOR USE  Sit on the edge of your bed if possible, or sit up as far as you can in bed or on a chair. Hold the incentive spirometer in an upright position. Breathe out normally. Place the mouthpiece in your mouth and seal your lips tightly around it. Breathe in slowly and as deeply as possible, raising the piston or the ball toward the top of the column. Hold your breath for 3-5 seconds or for as  long as possible. Allow the piston or ball to fall to the bottom of the column. Remove the mouthpiece from your mouth and breathe out normally. Rest for a few seconds and repeat Steps 1 through 7 at least 10 times every 1-2 hours when you are awake. Take your time and take a few normal breaths between deep breaths. The spirometer may include an indicator to show your best effort. Use the indicator as a goal to work toward during each repetition. After each set of 10 deep breaths, practice coughing to be sure your lungs are clear. If you have an incision (the cut made at the time of surgery), support your incision when coughing by placing a pillow or rolled up towels  firmly against it. Once you are able to get out of bed, walk around indoors and cough well. You may stop using the incentive spirometer when instructed by your caregiver.  RISKS AND COMPLICATIONS Take your time so you do not get dizzy or light-headed. If you are in pain, you may need to take or ask for pain medication before doing incentive spirometry. It is harder to take a deep breath if you are having pain. AFTER USE Rest and breathe slowly and easily. It can be helpful to keep track of a log of your progress. Your caregiver can provide you with a simple table to help with this. If you are using the spirometer at home, follow these instructions: SEEK MEDICAL CARE IF:  You are having difficultly using the spirometer. You have trouble using the spirometer as often as instructed. Your pain medication is not giving enough relief while using the spirometer. You develop fever of 100.5 F (38.1 C) or higher. SEEK IMMEDIATE MEDICAL CARE IF:  You cough up bloody sputum that had not been present before. You develop fever of 102 F (38.9 C) or greater. You develop worsening pain at or near the incision site. MAKE SURE YOU:  Understand these instructions. Will watch your condition. Will get help right away if you are not doing well or get worse. Document Released: 06/18/2006 Document Revised: 04/30/2011 Document Reviewed: 08/19/2006 [facility] Patient Information 2014 [place], [place].   ________________________________________________________________________

## 2023-03-19 NOTE — Progress Notes (Signed)
COVID Vaccine Completed:  Yes  Date of COVID positive in last 90 days:  PCP - Lucky Cowboy, MD Cardiologist - Nanetta Batty, MD  Chest x-ray -  EKG - 01-10-23 Epic Stress Test - 01-21-23 Epic ECHO - 01-11-23 Epic Cardiac Cath -  Pacemaker/ICD device last checked: Spinal Cord Stimulator:  Bowel Prep -   Sleep Study -  CPAP -   Fasting Blood Sugar -  Checks Blood Sugar _____ times a day  Last dose of GLP1 agonist-  N/A GLP1 instructions:  Hold 7 days before surgery    Last dose of SGLT-2 inhibitors-  N/A SGLT-2 instructions:  Hold 3 days before surgery    Blood Thinner Instructions:  Plavix Aspirin Instructions: Last Dose:  Activity level:  Can go up a flight of stairs and perform activities of daily living without stopping and without symptoms of chest pain or shortness of breath.  Able to exercise without symptoms  Unable to go up a flight of stairs without symptoms of     Anesthesia review:  Aflutter, CAD, NSVT, HTN  Patient denies shortness of breath, fever, cough and chest pain at PAT appointment  Patient verbalized understanding of instructions that were given to them at the PAT appointment. Patient was also instructed that they will need to review over the PAT instructions again at home before surgery.

## 2023-03-20 ENCOUNTER — Other Ambulatory Visit: Payer: Self-pay

## 2023-03-20 ENCOUNTER — Encounter (HOSPITAL_COMMUNITY): Payer: Self-pay

## 2023-03-20 ENCOUNTER — Encounter (HOSPITAL_COMMUNITY)
Admission: RE | Admit: 2023-03-20 | Discharge: 2023-03-20 | Disposition: A | Discharge status: Home / Self Care | Payer: 59 | Source: Ambulatory Visit | Attending: Orthopedic Surgery | Admitting: Orthopedic Surgery

## 2023-03-20 VITALS — BP 119/92 | HR 66 | Temp 98.8°F | Resp 12 | Ht 67.0 in | Wt 177.0 lb

## 2023-03-20 DIAGNOSIS — Z96651 Presence of right artificial knee joint: Secondary | ICD-10-CM | POA: Insufficient documentation

## 2023-03-20 DIAGNOSIS — F419 Anxiety disorder, unspecified: Secondary | ICD-10-CM | POA: Insufficient documentation

## 2023-03-20 DIAGNOSIS — Z9861 Coronary angioplasty status: Secondary | ICD-10-CM | POA: Diagnosis not present

## 2023-03-20 DIAGNOSIS — Z01812 Encounter for preprocedural laboratory examination: Secondary | ICD-10-CM | POA: Diagnosis present

## 2023-03-20 DIAGNOSIS — G8929 Other chronic pain: Secondary | ICD-10-CM | POA: Diagnosis not present

## 2023-03-20 DIAGNOSIS — K219 Gastro-esophageal reflux disease without esophagitis: Secondary | ICD-10-CM | POA: Diagnosis not present

## 2023-03-20 DIAGNOSIS — I2510 Atherosclerotic heart disease of native coronary artery without angina pectoris: Secondary | ICD-10-CM | POA: Diagnosis not present

## 2023-03-20 DIAGNOSIS — N183 Chronic kidney disease, stage 3 unspecified: Secondary | ICD-10-CM | POA: Diagnosis not present

## 2023-03-20 DIAGNOSIS — Z01818 Encounter for other preprocedural examination: Secondary | ICD-10-CM

## 2023-03-20 DIAGNOSIS — M170 Bilateral primary osteoarthritis of knee: Secondary | ICD-10-CM | POA: Diagnosis not present

## 2023-03-20 DIAGNOSIS — R7303 Prediabetes: Secondary | ICD-10-CM | POA: Insufficient documentation

## 2023-03-20 DIAGNOSIS — I130 Hypertensive heart and chronic kidney disease with heart failure and stage 1 through stage 4 chronic kidney disease, or unspecified chronic kidney disease: Secondary | ICD-10-CM | POA: Diagnosis not present

## 2023-03-20 DIAGNOSIS — M25562 Pain in left knee: Secondary | ICD-10-CM | POA: Insufficient documentation

## 2023-03-20 HISTORY — DX: Unspecified malignant neoplasm of skin, unspecified: C44.90

## 2023-03-20 LAB — CBC WITH DIFFERENTIAL/PLATELET
Abs Immature Granulocytes: 0.01 10*3/uL (ref 0.00–0.07)
Basophils Absolute: 0 10*3/uL (ref 0.0–0.1)
Basophils Relative: 0 %
Eosinophils Absolute: 0.1 10*3/uL (ref 0.0–0.5)
Eosinophils Relative: 2 %
HCT: 41.1 % (ref 39.0–52.0)
Hemoglobin: 14.1 g/dL (ref 13.0–17.0)
Immature Granulocytes: 0 %
Lymphocytes Relative: 24 %
Lymphs Abs: 1.7 10*3/uL (ref 0.7–4.0)
MCH: 34.5 pg — ABNORMAL HIGH (ref 26.0–34.0)
MCHC: 34.3 g/dL (ref 30.0–36.0)
MCV: 100.5 fL — ABNORMAL HIGH (ref 80.0–100.0)
Monocytes Absolute: 0.8 10*3/uL (ref 0.1–1.0)
Monocytes Relative: 11 %
Neutro Abs: 4.4 10*3/uL (ref 1.7–7.7)
Neutrophils Relative %: 63 %
Platelets: 209 10*3/uL (ref 150–400)
RBC: 4.09 MIL/uL — ABNORMAL LOW (ref 4.22–5.81)
RDW: 12.6 % (ref 11.5–15.5)
WBC: 7 10*3/uL (ref 4.0–10.5)
nRBC: 0 % (ref 0.0–0.2)

## 2023-03-20 LAB — COMPREHENSIVE METABOLIC PANEL
ALT: 33 U/L (ref 0–44)
AST: 35 U/L (ref 15–41)
Albumin: 4.1 g/dL (ref 3.5–5.0)
Alkaline Phosphatase: 44 U/L (ref 38–126)
Anion gap: 10 (ref 5–15)
BUN: 21 mg/dL (ref 8–23)
CO2: 24 mmol/L (ref 22–32)
Calcium: 9.6 mg/dL (ref 8.9–10.3)
Chloride: 103 mmol/L (ref 98–111)
Creatinine, Ser: 1.31 mg/dL — ABNORMAL HIGH (ref 0.61–1.24)
GFR, Estimated: 59 mL/min — ABNORMAL LOW (ref >60)
Glucose, Bld: 100 mg/dL — ABNORMAL HIGH (ref 70–99)
Potassium: 3.7 mmol/L (ref 3.5–5.1)
Sodium: 137 mmol/L (ref 135–145)
Total Bilirubin: 0.9 mg/dL (ref 0.0–1.2)
Total Protein: 6.9 g/dL (ref 6.5–8.1)

## 2023-03-20 LAB — TYPE AND SCREEN
ABO/RH(D): O POS
Antibody Screen: NEGATIVE

## 2023-03-20 LAB — SURGICAL PCR SCREEN
MRSA, PCR: NEGATIVE
Staphylococcus aureus: NEGATIVE

## 2023-03-21 ENCOUNTER — Encounter (HOSPITAL_COMMUNITY): Payer: Self-pay

## 2023-03-21 NOTE — Progress Notes (Signed)
Case: [id number] Date/Time: 04/01/23 1245   Procedure: TOTAL KNEE ARTHROPLASTY (Left: Knee)   Anesthesia type: Spinal   Pre-op diagnosis: OA LEFT KNEE   Location: [facility] ROOM 08 / [facility]   Surgeons: [name], MD       DISCUSSION: Marvin Carter is a 68 yo male who presents to PAT prior to surgery above. PMH of HTN, CAD s/p PCI (2012), NSVT, A. Flutter (not anticoagulated), GERD, prediabetes, CKD, arthritis, anxiety.  Patient follows with Cardiology for hx of CAD s/p PCI in 2012. Patient reportedly has hx of A. Flutter but after discussion with Dr. [name] he has declined anticoagulation. He is on Plavix. Last seen on 01/10/23 for pre op evaluation. He underwent a stress test and echo both which came back normal/low risk findings and he was cleared for surgery:  "Preoperative cardiovascular evaluation: Secondary prevention stressed with the patient.  Importance of compliance with diet medication stressed and vocalized understanding.  He is asymptomatic but does not do much because of his knee issues.  Overall he is an active gentleman.  I recommended Lexiscan sestamibi.  If this is negative then he is not at high risk for coronary events during the aforementioned surgery.  Meticulous hemodynamic monitoring will further reduce the risk of coronary events.  Sublingual nitroglycerin prescription was sent, its protocol and 911 protocol explained and the patient vocalized understanding questions were answered to the patient's satisfaction"  Last seen by PCP on 01/28/23. All issues stable/controlled.  Plavix.  Per patient to stop 5 days prior  to surgery   VS: BP (!) 119/92   Pulse 66   Temp 37.1 C (Oral)   Resp 12   Ht 5\' 7"  (1.702 m)   Wt 80.3 kg   SpO2 98%   BMI 27.72 kg/m   PROVIDERS: Lucky Cowboy, MD Cardiology: Belva Crome, MD  LABS: Labs reviewed: Acceptable for surgery. (all labs ordered are listed, but only abnormal results are displayed)  Labs Reviewed  CBC  WITH DIFFERENTIAL/PLATELET - Abnormal; Notable for the following components:      Result Value   RBC 4.09 (*)    MCV 100.5 (*)    MCH 34.5 (*)    All other components within normal limits  COMPREHENSIVE METABOLIC PANEL - Abnormal; Notable for the following components:   Glucose, Bld 100 (*)    Creatinine, Ser 1.31 (*)    GFR, Estimated 59 (*)    All other components within normal limits  SURGICAL PCR SCREEN  TYPE AND SCREEN     IMAGES:   EKG:   CV:  Stress test 01/21/23:    The study is normal. The study is low risk.   No ST deviation was noted.   Left ventricular function is normal. Nuclear stress EF: 62%. The left ventricular ejection fraction is normal (55-65%). End diastolic cavity size is normal. End systolic cavity size is normal.   Prior study available for comparison from 09/15/2007.   Electronically signed by [name], MD   Low risk stress nuclear study with normal perfusion and normal left ventricular regional and global systolic function.  Echo 01/11/23:  IMPRESSIONS    1. Left ventricular ejection fraction, by estimation, is 60 to 65%. The left ventricle has normal function. The left ventricle has no regional wall motion abnormalities. Left ventricular diastolic parameters are consistent with Grade I diastolic dysfunction (impaired relaxation).  2. Right ventricular systolic function is normal. The right ventricular size is normal.  3. The mitral valve is  normal in structure. Trivial mitral valve regurgitation. No evidence of mitral stenosis.  4. The aortic valve is tricuspid. Aortic valve regurgitation is not visualized. Aortic valve sclerosis is present, with no evidence of aortic valve stenosis.  5. The inferior vena cava is normal in size with greater than 50% respiratory variability, suggesting right atrial pressure of 3 mmHg.    Past Medical History:  Diagnosis Date   Anxiety    Atrial flutter, paroxysmal (HCC) 01/04/2011   Benign  hypertensive heart and CKD, stage 3 (GFR 30-59), w CHF (HCC) 10/13/2017   CAD (coronary artery disease), native coronary artery 01/04/2011   Diverticulitis    Diverticulosis    Drug-induced erectile dysfunction 03/01/2015   Essential hypertension    hypertension     GERD (gastroesophageal reflux disease)    Hemorrhoids, internal, with bleeding 11/08/2010   History of meniscectomy of right knee 01/02/2011   Hypertension    Medication management 04/30/2013   NSVT (nonsustained ventricular tachycardia) (HCC) 01/02/2011   Obesity (BMI 30.0-34.9) 01/10/2018   Other abnormal glucose (hx of prediabetes) 04/30/2013   5.7% in 2012, 5.8% in 2015     Primary osteoarthritis of right knee 04/03/2017   Prolapsed external hemorrhoids 10/19/2010   Skin cancer    Nose and ear   Spinal stenosis of lumbar region 07/28/2018   Stented coronary artery 01/04/2011   LAD DES Resolute.   Testosterone deficiency 04/30/2013   Vitamin D deficiency 04/30/2013    Past Surgical History:  Procedure Laterality Date   APPENDECTOMY     CARPAL TUNNEL RELEASE Right 2021   Dr. Janee Morn   COLONOSCOPY     CORONARY ANGIOPLASTY WITH STENT PLACEMENT  2012   ELBOW SURGERY Right 2021   Cyst excision/nerve decompression, Dr. Janee Morn   HAND SURGERY     right   KNEE ARTHROSCOPY     x 8 left   KNEE ARTHROSCOPY Right 04/21/2012   Procedure: ARTHROSCOPY KNEE;  Surgeon: Nilda Simmer, MD;  Location: [facility] OR;  Service: Orthopedics;  Laterality: Right;  medial and lateral meniscectomies   LEFT HEART CATHETERIZATION WITH CORONARY ANGIOGRAM N/A 01/03/2011   Procedure: LEFT HEART CATHETERIZATION WITH CORONARY ANGIOGRAM;  Surgeon: Runell Gess, MD;  Location: [facility] CATH LAB;  Service: Cardiovascular;  Laterality: N/A;   PERCUTANEOUS CORONARY STENT INTERVENTION (PCI-S) N/A 01/03/2011   Procedure: PERCUTANEOUS CORONARY STENT INTERVENTION (PCI-S);  Surgeon: Runell Gess, MD;  Location: [facility] CATH LAB;  Service: Cardiovascular;   Laterality: N/A;   ROTATOR CUFF REPAIR     bilateral   TONSILLECTOMY     TOTAL KNEE ARTHROPLASTY Right 04/15/2017   TOTAL KNEE ARTHROPLASTY Right 04/15/2017   Procedure: TOTAL KNEE ARTHROPLASTY;  Surgeon: Salvatore Marvel, MD;  Location: [facility] OR;  Service: Orthopedics;  Laterality: Right;   TRICEPS TENDON REPAIR Bilateral     MEDICATIONS:  acetaminophen (TYLENOL) 325 MG tablet   atenolol (TENORMIN) 100 MG tablet   atorvastatin (LIPITOR) 40 MG tablet   b complex vitamins capsule   buPROPion (WELLBUTRIN XL) 300 MG 24 hr tablet   cholecalciferol (VITAMIN D3) 25 MCG (1000 UNIT) tablet   clopidogrel (PLAVIX) 75 MG tablet   cyclobenzaprine (FLEXERIL) 10 MG tablet   diclofenac sodium (VOLTAREN) 1 % GEL   diltiazem (CARDIZEM LA) 240 MG 24 hr tablet   doxazosin (CARDURA) 8 MG tablet   furosemide (LASIX) 80 MG tablet   Magnesium 400 MG CAPS   meloxicam (MOBIC) 15 MG tablet   Multiple Vitamin (MULTIVITAMIN WITH MINERALS) TABS  nitroGLYCERIN (NITROSTAT) 0.4 MG SL tablet   olmesartan (BENICAR) 40 MG tablet   pantoprazole (PROTONIX) 40 MG tablet   tadalafil (CIALIS) 20 MG tablet   tamsulosin (FLOMAX) 0.4 MG CAPS capsule   traZODone (DESYREL) 150 MG tablet   vitamin E 180 MG (400 UNITS) capsule   zinc gluconate 50 MG tablet   No current facility-administered medications for this encounter.   Marcille Blanco [facility] Surgical Short Stay/Anesthesiology [facility] Phone [phone] 03/21/2023 9:47 AM

## 2023-03-21 NOTE — Anesthesia Preprocedure Evaluation (Addendum)
Anesthesia Evaluation  Patient identified by MRN, date of birth, ID band Patient awake    Reviewed: Allergy & Precautions, H&P , NPO status , Patient's Chart, lab work & pertinent test results  Airway Mallampati: II   Neck ROM: full    Dental   Pulmonary neg pulmonary ROS   breath sounds clear to auscultation       Cardiovascular hypertension, + CAD and +CHF   Rhythm:regular Rate:Normal     Neuro/Psych   Anxiety        GI/Hepatic ,GERD  ,,  Endo/Other    Renal/GU Renal InsufficiencyRenal disease     Musculoskeletal  (+) Arthritis ,    Abdominal   Peds  Hematology   Anesthesia Other Findings   Reproductive/Obstetrics                              Anesthesia Physical Anesthesia Plan  ASA: 3  Anesthesia Plan: MAC and Spinal   Post-op Pain Management: Regional block*   Induction: Intravenous  PONV Risk Score and Plan: 1 and Propofol infusion and Treatment may vary due to age or medical condition  Airway Management Planned: Simple Face Mask  Additional Equipment:   Intra-op Plan:   Post-operative Plan:   Informed Consent: I have reviewed the patients History and Physical, chart, labs and discussed the procedure including the risks, benefits and alternatives for the proposed anesthesia with the patient or authorized representative who has indicated his/her understanding and acceptance.     Dental advisory given  Plan Discussed with: CRNA, Anesthesiologist and Surgeon  Anesthesia Plan Comments: (See PAT note from 1/29 by Sherlie Ban PA-C )         Anesthesia Quick Evaluation

## 2023-03-28 NOTE — Telephone Encounter (Signed)
 Marvin Carter

## 2023-04-01 ENCOUNTER — Encounter (HOSPITAL_COMMUNITY): Payer: Self-pay | Admitting: Orthopedic Surgery

## 2023-04-01 ENCOUNTER — Other Ambulatory Visit: Payer: Self-pay

## 2023-04-01 ENCOUNTER — Encounter (HOSPITAL_COMMUNITY)
Admission: RE | Disposition: A | Discharge status: Home / Self Care | Payer: Self-pay | Source: Home / Self Care | Attending: Orthopedic Surgery

## 2023-04-01 ENCOUNTER — Observation Stay (HOSPITAL_COMMUNITY)
Admission: RE | Admit: 2023-04-01 | Discharge: 2023-04-02 | Disposition: A | Discharge status: Home / Self Care | Payer: 59 | Attending: Orthopedic Surgery | Admitting: Orthopedic Surgery

## 2023-04-01 ENCOUNTER — Ambulatory Visit (HOSPITAL_COMMUNITY): Payer: 59 | Admitting: Medical

## 2023-04-01 ENCOUNTER — Ambulatory Visit (HOSPITAL_COMMUNITY): Payer: 59 | Admitting: Anesthesiology

## 2023-04-01 ENCOUNTER — Observation Stay (HOSPITAL_COMMUNITY): Payer: 59

## 2023-04-01 DIAGNOSIS — I2510 Atherosclerotic heart disease of native coronary artery without angina pectoris: Secondary | ICD-10-CM | POA: Diagnosis not present

## 2023-04-01 DIAGNOSIS — Z96651 Presence of right artificial knee joint: Secondary | ICD-10-CM | POA: Diagnosis not present

## 2023-04-01 DIAGNOSIS — Z955 Presence of coronary angioplasty implant and graft: Secondary | ICD-10-CM | POA: Insufficient documentation

## 2023-04-01 DIAGNOSIS — Z7902 Long term (current) use of antithrombotics/antiplatelets: Secondary | ICD-10-CM | POA: Insufficient documentation

## 2023-04-01 DIAGNOSIS — N183 Chronic kidney disease, stage 3 unspecified: Secondary | ICD-10-CM | POA: Diagnosis not present

## 2023-04-01 DIAGNOSIS — M1712 Unilateral primary osteoarthritis, left knee: Principal | ICD-10-CM | POA: Diagnosis present

## 2023-04-01 DIAGNOSIS — Z79899 Other long term (current) drug therapy: Secondary | ICD-10-CM | POA: Diagnosis not present

## 2023-04-01 DIAGNOSIS — I509 Heart failure, unspecified: Secondary | ICD-10-CM | POA: Insufficient documentation

## 2023-04-01 DIAGNOSIS — I130 Hypertensive heart and chronic kidney disease with heart failure and stage 1 through stage 4 chronic kidney disease, or unspecified chronic kidney disease: Secondary | ICD-10-CM | POA: Diagnosis not present

## 2023-04-01 DIAGNOSIS — Z85828 Personal history of other malignant neoplasm of skin: Secondary | ICD-10-CM | POA: Insufficient documentation

## 2023-04-01 DIAGNOSIS — G8929 Other chronic pain: Principal | ICD-10-CM

## 2023-04-01 HISTORY — PX: TOTAL KNEE ARTHROPLASTY: SHX125

## 2023-04-01 SURGERY — ARTHROPLASTY, KNEE, TOTAL
Anesthesia: Monitor Anesthesia Care | Site: Knee | Laterality: Left

## 2023-04-01 MED ORDER — BUPIVACAINE LIPOSOME 1.3 % IJ SUSP
INTRAMUSCULAR | Status: AC
  Filled 2023-04-01: qty 20

## 2023-04-01 MED ORDER — ATORVASTATIN CALCIUM 40 MG PO TABS
40.0000 mg | ORAL_TABLET | Freq: Every day | ORAL | Status: DC
  Filled 2023-04-01: qty 1

## 2023-04-01 MED ORDER — DOXAZOSIN MESYLATE 4 MG PO TABS
4.0000 mg | ORAL_TABLET | Freq: Every evening | ORAL | Status: DC
  Administered 2023-04-01: 4 mg via ORAL
  Filled 2023-04-01: qty 1

## 2023-04-01 MED ORDER — MENTHOL 3 MG MT LOZG: 1.0000 | LOZENGE | OROMUCOSAL | Status: DC | PRN

## 2023-04-01 MED ORDER — BUPIVACAINE IN DEXTROSE 0.75-8.25 % IT SOLN
INTRATHECAL | Status: DC | PRN
  Administered 2023-04-01: 1.8 mL via INTRATHECAL

## 2023-04-01 MED ORDER — DILTIAZEM HCL ER COATED BEADS 120 MG PO CP24
120.0000 mg | ORAL_CAPSULE | Freq: Every day | ORAL | Status: DC
  Administered 2023-04-02: 120 mg via ORAL
  Filled 2023-04-01: qty 1

## 2023-04-01 MED ORDER — ZOLPIDEM TARTRATE 5 MG PO TABS
5.0000 mg | ORAL_TABLET | Freq: Every evening | ORAL | Status: DC | PRN
  Administered 2023-04-01: 5 mg via ORAL
  Filled 2023-04-01: qty 1

## 2023-04-01 MED ORDER — FENTANYL CITRATE PF 50 MCG/ML IJ SOSY
100.0000 ug | PREFILLED_SYRINGE | Freq: Once | INTRAMUSCULAR | Status: AC
  Administered 2023-04-01: 50 ug via INTRAVENOUS
  Filled 2023-04-01: qty 2

## 2023-04-01 MED ORDER — ISOPROPYL ALCOHOL 70 % SOLN
Status: DC | PRN
Start: 2023-04-01 — End: 2023-04-01
  Administered 2023-04-01: 1 via TOPICAL

## 2023-04-01 MED ORDER — FUROSEMIDE 40 MG PO TABS
80.0000 mg | ORAL_TABLET | Freq: Every day | ORAL | Status: DC
  Filled 2023-04-01: qty 2

## 2023-04-01 MED ORDER — CEFAZOLIN SODIUM-DEXTROSE 2-4 GM/100ML-% IV SOLN
2.0000 g | Freq: Four times a day (QID) | INTRAVENOUS | Status: AC
  Administered 2023-04-01 (×2): 2 g via INTRAVENOUS
  Filled 2023-04-01 (×2): qty 100

## 2023-04-01 MED ORDER — PANTOPRAZOLE SODIUM 40 MG PO TBEC: 40.0000 mg | DELAYED_RELEASE_TABLET | Freq: Every day | ORAL | Status: DC

## 2023-04-01 MED ORDER — ONDANSETRON HCL 4 MG/2ML IJ SOLN
INTRAMUSCULAR | Status: AC
  Filled 2023-04-01: qty 2

## 2023-04-01 MED ORDER — ACETAMINOPHEN 500 MG PO TABS
1000.0000 mg | ORAL_TABLET | Freq: Four times a day (QID) | ORAL | Status: AC
  Administered 2023-04-01 – 2023-04-02 (×4): 1000 mg via ORAL
  Filled 2023-04-01 (×4): qty 2

## 2023-04-01 MED ORDER — POLYETHYLENE GLYCOL 3350 17 G PO PACK: 17.0000 g | PACK | Freq: Every day | ORAL | Status: DC | PRN

## 2023-04-01 MED ORDER — METHOCARBAMOL 1000 MG/10ML IJ SOLN: 500.0000 mg | Freq: Four times a day (QID) | INTRAMUSCULAR | Status: DC | PRN

## 2023-04-01 MED ORDER — ONDANSETRON HCL 4 MG/2ML IJ SOLN
INTRAMUSCULAR | Status: DC | PRN
  Administered 2023-04-01: 4 mg via INTRAVENOUS

## 2023-04-01 MED ORDER — PROPOFOL 1000 MG/100ML IV EMUL
INTRAVENOUS | Status: AC
  Filled 2023-04-01: qty 200

## 2023-04-01 MED ORDER — PANTOPRAZOLE SODIUM 40 MG PO TBEC
40.0000 mg | DELAYED_RELEASE_TABLET | Freq: Every day | ORAL | Status: DC
  Administered 2023-04-01: 40 mg via ORAL
  Filled 2023-04-01: qty 1

## 2023-04-01 MED ORDER — LACTATED RINGERS IV SOLN: INTRAVENOUS | Status: AC

## 2023-04-01 MED ORDER — DILTIAZEM HCL ER 120 MG PO TB24: 120.0000 mg | ORAL_TABLET | Freq: Every day | ORAL | Status: DC

## 2023-04-01 MED ORDER — PHENOL 1.4 % MT LIQD: 1.0000 | OROMUCOSAL | Status: DC | PRN

## 2023-04-01 MED ORDER — EPHEDRINE SULFATE-NACL 50-0.9 MG/10ML-% IV SOSY
PREFILLED_SYRINGE | INTRAVENOUS | Status: DC | PRN
  Administered 2023-04-01: 5 mg via INTRAVENOUS
  Administered 2023-04-01 (×2): 7.5 mg via INTRAVENOUS
  Administered 2023-04-01: 5 mg via INTRAVENOUS

## 2023-04-01 MED ORDER — ATENOLOL 50 MG PO TABS
100.0000 mg | ORAL_TABLET | Freq: Every day | ORAL | Status: DC
Start: 2023-04-02 — End: 2023-04-02
  Administered 2023-04-02: 100 mg via ORAL
  Filled 2023-04-01: qty 2

## 2023-04-01 MED ORDER — PROPOFOL 500 MG/50ML IV EMUL
INTRAVENOUS | Status: DC | PRN
  Administered 2023-04-01: 120 ug/kg/min via INTRAVENOUS

## 2023-04-01 MED ORDER — WATER FOR IRRIGATION, STERILE IR SOLN
Status: DC | PRN
  Administered 2023-04-01: 1000 mL

## 2023-04-01 MED ORDER — BUPROPION HCL ER (XL) 300 MG PO TB24
300.0000 mg | ORAL_TABLET | Freq: Every day | ORAL | Status: DC
Start: 2023-04-02 — End: 2023-04-02
  Administered 2023-04-02: 300 mg via ORAL
  Filled 2023-04-01: qty 1

## 2023-04-01 MED ORDER — CYCLOBENZAPRINE HCL 5 MG PO TABS: 5.0000 mg | ORAL_TABLET | Freq: Three times a day (TID) | ORAL | Status: DC | PRN

## 2023-04-01 MED ORDER — HYDROMORPHONE HCL 1 MG/ML IJ SOLN: 0.5000 mg | INTRAMUSCULAR | Status: DC | PRN

## 2023-04-01 MED ORDER — ACETAMINOPHEN 500 MG PO TABS
1000.0000 mg | ORAL_TABLET | Freq: Once | ORAL | Status: DC
  Filled 2023-04-01: qty 2

## 2023-04-01 MED ORDER — 0.9 % SODIUM CHLORIDE (POUR BTL) OPTIME
TOPICAL | Status: DC | PRN
  Administered 2023-04-01: 1000 mL

## 2023-04-01 MED ORDER — SODIUM CHLORIDE (PF) 0.9 % IJ SOLN
INTRAMUSCULAR | Status: AC
  Filled 2023-04-01: qty 50

## 2023-04-01 MED ORDER — ASPIRIN 81 MG PO CHEW
81.0000 mg | CHEWABLE_TABLET | Freq: Two times a day (BID) | ORAL | Status: DC
  Administered 2023-04-01 – 2023-04-02 (×2): 81 mg via ORAL
  Filled 2023-04-01 (×2): qty 1

## 2023-04-01 MED ORDER — DEXAMETHASONE SODIUM PHOSPHATE 10 MG/ML IJ SOLN
8.0000 mg | Freq: Once | INTRAMUSCULAR | Status: AC
  Administered 2023-04-01: 8 mg via INTRAVENOUS

## 2023-04-01 MED ORDER — ORAL CARE MOUTH RINSE: 15.0000 mL | Freq: Once | OROMUCOSAL | Status: AC

## 2023-04-01 MED ORDER — DEXAMETHASONE SODIUM PHOSPHATE 10 MG/ML IJ SOLN
INTRAMUSCULAR | Status: AC
  Filled 2023-04-01: qty 1

## 2023-04-01 MED ORDER — SODIUM CHLORIDE 0.9 % IV SOLN: INTRAVENOUS | Status: DC

## 2023-04-01 MED ORDER — DIPHENHYDRAMINE HCL 12.5 MG/5ML PO ELIX
12.5000 mg | ORAL_SOLUTION | ORAL | Status: DC | PRN
  Administered 2023-04-02: 25 mg via ORAL
  Filled 2023-04-01: qty 10

## 2023-04-01 MED ORDER — ROPIVACAINE HCL 5 MG/ML IJ SOLN
INTRAMUSCULAR | Status: DC | PRN
  Administered 2023-04-01: 25 mL via PERINEURAL

## 2023-04-01 MED ORDER — KETOROLAC TROMETHAMINE 15 MG/ML IJ SOLN
7.5000 mg | Freq: Four times a day (QID) | INTRAMUSCULAR | Status: DC
  Administered 2023-04-01 – 2023-04-02 (×3): 7.5 mg via INTRAVENOUS
  Filled 2023-04-01 (×3): qty 1

## 2023-04-01 MED ORDER — METHOCARBAMOL 500 MG PO TABS
500.0000 mg | ORAL_TABLET | Freq: Four times a day (QID) | ORAL | Status: DC | PRN
  Administered 2023-04-01: 500 mg via ORAL
  Filled 2023-04-01: qty 1

## 2023-04-01 MED ORDER — BUPIVACAINE-EPINEPHRINE 0.25% -1:200000 IJ SOLN
INTRAMUSCULAR | Status: AC
  Filled 2023-04-01: qty 1

## 2023-04-01 MED ORDER — PHENYLEPHRINE HCL-NACL 20-0.9 MG/250ML-% IV SOLN
INTRAVENOUS | Status: DC | PRN
  Administered 2023-04-01: 80 ug via INTRAVENOUS
  Administered 2023-04-01: 25 ug/min via INTRAVENOUS
  Administered 2023-04-01: 120 ug via INTRAVENOUS

## 2023-04-01 MED ORDER — OXYCODONE HCL 5 MG PO TABS
5.0000 mg | ORAL_TABLET | ORAL | Status: DC | PRN
  Administered 2023-04-01 (×2): 5 mg via ORAL
  Administered 2023-04-01 – 2023-04-02 (×2): 10 mg via ORAL
  Filled 2023-04-01 (×2): qty 2
  Filled 2023-04-01 (×2): qty 1

## 2023-04-01 MED ORDER — CEFAZOLIN SODIUM-DEXTROSE 2-4 GM/100ML-% IV SOLN
2.0000 g | INTRAVENOUS | Status: AC
  Administered 2023-04-01: 2 g via INTRAVENOUS
  Filled 2023-04-01: qty 100

## 2023-04-01 MED ORDER — MIDAZOLAM HCL 2 MG/2ML IJ SOLN
2.0000 mg | Freq: Once | INTRAMUSCULAR | Status: AC
  Administered 2023-04-01: 1 mg via INTRAVENOUS
  Filled 2023-04-01: qty 2

## 2023-04-01 MED ORDER — DOCUSATE SODIUM 100 MG PO CAPS
100.0000 mg | ORAL_CAPSULE | Freq: Two times a day (BID) | ORAL | Status: DC
  Administered 2023-04-01 – 2023-04-02 (×2): 100 mg via ORAL
  Filled 2023-04-01 (×2): qty 1

## 2023-04-01 MED ORDER — IRBESARTAN 150 MG PO TABS
300.0000 mg | ORAL_TABLET | Freq: Every day | ORAL | Status: DC
  Administered 2023-04-02: 300 mg via ORAL
  Filled 2023-04-01: qty 2

## 2023-04-01 MED ORDER — TAMSULOSIN HCL 0.4 MG PO CAPS
0.8000 mg | ORAL_CAPSULE | Freq: Every evening | ORAL | Status: DC
  Administered 2023-04-01: 0.8 mg via ORAL
  Filled 2023-04-01: qty 2

## 2023-04-01 MED ORDER — TRANEXAMIC ACID-NACL 1000-0.7 MG/100ML-% IV SOLN
1000.0000 mg | INTRAVENOUS | Status: AC
  Administered 2023-04-01: 1000 mg via INTRAVENOUS
  Filled 2023-04-01: qty 100

## 2023-04-01 MED ORDER — CHLORHEXIDINE GLUCONATE 0.12 % MT SOLN
15.0000 mL | Freq: Once | OROMUCOSAL | Status: AC
  Administered 2023-04-01: 15 mL via OROMUCOSAL

## 2023-04-01 MED ORDER — ACETAMINOPHEN 325 MG PO TABS: 325.0000 mg | ORAL_TABLET | Freq: Four times a day (QID) | ORAL | Status: DC | PRN

## 2023-04-01 MED ORDER — CLOPIDOGREL BISULFATE 75 MG PO TABS
75.0000 mg | ORAL_TABLET | Freq: Every day | ORAL | Status: DC
  Administered 2023-04-02: 75 mg via ORAL
  Filled 2023-04-01: qty 1

## 2023-04-01 MED ORDER — SODIUM CHLORIDE 0.9 % IR SOLN
Status: DC | PRN
  Administered 2023-04-01: 2000 mL

## 2023-04-01 MED ORDER — ONDANSETRON HCL 4 MG/2ML IJ SOLN: 4.0000 mg | Freq: Four times a day (QID) | INTRAMUSCULAR | Status: DC | PRN

## 2023-04-01 MED ORDER — POVIDONE-IODINE 10 % EX SWAB: 2.0000 | Freq: Once | CUTANEOUS | Status: DC

## 2023-04-01 MED ORDER — LACTATED RINGERS IV SOLN: INTRAVENOUS | Status: DC

## 2023-04-01 MED ORDER — SODIUM CHLORIDE (PF) 0.9 % IJ SOLN
INTRAMUSCULAR | Status: DC | PRN
  Administered 2023-04-01: 80 mL

## 2023-04-01 MED ORDER — NITROGLYCERIN 0.4 MG SL SUBL: 0.4000 mg | SUBLINGUAL_TABLET | SUBLINGUAL | Status: DC | PRN

## 2023-04-01 MED ORDER — BUPIVACAINE LIPOSOME 1.3 % IJ SUSP: 20.0000 mL | Freq: Once | INTRAMUSCULAR | Status: DC

## 2023-04-01 MED ORDER — ONDANSETRON HCL 4 MG PO TABS: 4.0000 mg | ORAL_TABLET | Freq: Four times a day (QID) | ORAL | Status: DC | PRN

## 2023-04-01 SURGICAL SUPPLY — 56 items
BAG COUNTER SPONGE SURGICOUNT (BAG) IMPLANT
BLADE SAG 18X100X1.27 (BLADE) ×1 IMPLANT
BLADE SAW SAG 35X64 .89 (BLADE) ×1 IMPLANT
BLADE SAW SGTL 11.0X1.19X90.0M (BLADE) IMPLANT
BNDG COHESIVE 3X5 TAN ST LF (GAUZE/BANDAGES/DRESSINGS) ×1 IMPLANT
BNDG ELASTIC 6X10 VLCR STRL LF (GAUZE/BANDAGES/DRESSINGS) ×1 IMPLANT
BOWL SMART MIX CTS (DISPOSABLE) ×1 IMPLANT
CEMENT BONE R 1X40 (Cement) IMPLANT
CEMENT BONE REFOBACIN R1X40 US (Cement) IMPLANT
CHLORAPREP W/TINT 26 (MISCELLANEOUS) ×2 IMPLANT
COMP FEM KNEE STD PS 9 LT (Joint) ×1 IMPLANT
COMP PATELLA 3 PEG 35 (Joint) ×1 IMPLANT
COMP TIB PS G 0D LT (Joint) ×1 IMPLANT
COMPONENT FEM KNEE STD PS 9 LT (Joint) IMPLANT
COMPONENT PATELLA 3 PEG 35 (Joint) IMPLANT
COMPONET TIB PS G 0D LT (Joint) IMPLANT
COVER SURGICAL LIGHT HANDLE (MISCELLANEOUS) ×1 IMPLANT
CUFF TRNQT CYL 34X4.125X (TOURNIQUET CUFF) ×1 IMPLANT
DERMABOND ADVANCED .7 DNX12 (GAUZE/BANDAGES/DRESSINGS) ×1 IMPLANT
DRAPE INCISE IOBAN 85X60 (DRAPES) ×1 IMPLANT
DRAPE SHEET LG 3/4 BI-LAMINATE (DRAPES) ×1 IMPLANT
DRAPE U-SHAPE 47X51 STRL (DRAPES) ×1 IMPLANT
DRSG AQUACEL AG ADV 3.5X10 (GAUZE/BANDAGES/DRESSINGS) ×1 IMPLANT
ELECT REM PT RETURN 15FT ADLT (MISCELLANEOUS) ×1 IMPLANT
GAUZE SPONGE 4X4 12PLY STRL (GAUZE/BANDAGES/DRESSINGS) ×1 IMPLANT
GLOVE BIO SURGEON STRL SZ 6.5 (GLOVE) ×2 IMPLANT
GLOVE BIOGEL PI IND STRL 6.5 (GLOVE) ×1 IMPLANT
GLOVE BIOGEL PI IND STRL 8 (GLOVE) ×1 IMPLANT
GLOVE SURG ORTHO 8.0 STRL STRW (GLOVE) ×2 IMPLANT
GOWN STRL REUS W/ TWL XL LVL3 (GOWN DISPOSABLE) ×2 IMPLANT
HOLDER FOLEY CATH W/STRAP (MISCELLANEOUS) ×1 IMPLANT
HOOD PEEL AWAY T7 (MISCELLANEOUS) ×3 IMPLANT
KIT TURNOVER KIT A (KITS) IMPLANT
LINER TIB ASF PS GH/8-11 14 LT (Liner) IMPLANT
MANIFOLD NEPTUNE II (INSTRUMENTS) ×1 IMPLANT
MARKER SKIN DUAL TIP RULER LAB (MISCELLANEOUS) ×1 IMPLANT
NS IRRIG 1000ML POUR BTL (IV SOLUTION) ×1 IMPLANT
PACK TOTAL KNEE CUSTOM (KITS) ×1 IMPLANT
PIN DRILL HDLS TROCAR 75 4PK (PIN) IMPLANT
SCREW HEADED 33MM KNEE (MISCELLANEOUS) IMPLANT
SET HNDPC FAN SPRY TIP SCT (DISPOSABLE) ×1 IMPLANT
SOLUTION IRRIG SURGIPHOR (IV SOLUTION) IMPLANT
SPIKE FLUID TRANSFER (MISCELLANEOUS) ×1 IMPLANT
STRIP CLOSURE SKIN 1/2X4 (GAUZE/BANDAGES/DRESSINGS) ×1 IMPLANT
SUT MNCRL AB 3-0 PS2 18 (SUTURE) ×1 IMPLANT
SUT STRATAFIX 0 PDS 27 VIOLET (SUTURE) ×1
SUT STRATAFIX 14 PDO 48 VLT (SUTURE) ×1 IMPLANT
SUT STRATAFIX PDO 1 14 VIOLET (SUTURE) ×1
SUT VIC AB 1 CT1 36 (SUTURE) IMPLANT
SUT VIC AB 2-0 CT2 27 (SUTURE) ×2 IMPLANT
SUTURE STRATFX 0 PDS 27 VIOLET (SUTURE) ×1 IMPLANT
SYR 50ML LL SCALE MARK (SYRINGE) ×1 IMPLANT
TRAY FOLEY MTR SLVR 14FR STAT (SET/KITS/TRAYS/PACK) IMPLANT
TUBE SUCTION HIGH CAP CLEAR NV (SUCTIONS) ×1 IMPLANT
UNDERPAD 30X36 HEAVY ABSORB (UNDERPADS AND DIAPERS) ×1 IMPLANT
WRAP KNEE MAXI GEL POST OP (GAUZE/BANDAGES/DRESSINGS) IMPLANT

## 2023-04-01 NOTE — Progress Notes (Signed)
 Orthopedic Tech Progress Note Patient Details:  Marvin Carter [birth year] [id number]  Patient ID: [name], male   DOB: November 28, 1955, 68 y.o.   MRN: [id number] Pt declined bone foam as he already has one at home. Toi Foster 04/01/2023, 3:07 PM

## 2023-04-01 NOTE — Interval H&P Note (Signed)

## 2023-04-01 NOTE — Anesthesia Procedure Notes (Signed)
 Anesthesia Regional Block: Adductor canal block   Pre-Anesthetic Checklist: , timeout performed,  Correct Patient, Correct Site, Correct Laterality,  Correct Procedure, Correct Position, site marked,  Risks and benefits discussed,  Surgical consent,  Pre-op evaluation,  At surgeon's request and post-op pain management  Laterality: Left  Prep: chloraprep       Needles:  Injection technique: Single-shot  Needle Type: Echogenic Needle     Needle Length: 9cm  Needle Gauge: 21     Additional Needles:   Narrative:  Start time: 04/01/2023 10:15 AM End time: 04/01/2023 10:21 AM Injection made incrementally with aspirations every 5 mL.  Performed by: Personally  Anesthesiologist: Ellena Gurney, MD  Additional Notes: Pt tolerated the procedure well.

## 2023-04-01 NOTE — Discharge Instructions (Signed)
 INSTRUCTIONS AFTER JOINT REPLACEMENT   Remove items at home which could result in a fall. This includes throw rugs or furniture in walking pathways ICE to the affected joint every three hours while awake for 30 minutes at a time, for at least the first 3-5 days, and then as needed for pain and swelling.  Continue to use ice for pain and swelling. You may notice swelling that will progress down to the foot and ankle.  This is normal after surgery.  Elevate your leg when you are not up walking on it.   Continue to use the breathing machine you got in the hospital (incentive spirometer) which will help keep your temperature down.  It is common for your temperature to cycle up and down following surgery, especially at night when you are not up moving around and exerting yourself.  The breathing machine keeps your lungs expanded and your temperature down.  DIET:  As you were doing prior to hospitalization, we recommend a well-balanced diet.  DRESSING / WOUND CARE / SHOWERING:  Keep the surgical dressing until follow up.  The dressing is water  proof, so you can shower without any extra covering.  IF THE DRESSING FALLS OFF or the wound gets wet inside, change the dressing with sterile gauze.  Please use good hand washing techniques before changing the dressing.  Do not use any lotions or creams on the incision until instructed by your surgeon.    ACTIVITY  Increase activity slowly as tolerated, but follow the weight bearing instructions below.   No driving for 6 weeks or until further direction given by your physician.  You cannot drive while taking narcotics.  No lifting or carrying greater than 10 lbs. until further directed by your surgeon. Avoid periods of inactivity such as sitting longer than an hour when not asleep. This helps prevent blood clots.  You may return to work once you are authorized by your doctor.   WEIGHT BEARING: Weight bearing as tolerated with assist device (walker, cane, etc) as  directed, use it as long as suggested by your surgeon or therapist, typically at least 4-6 weeks.  EXERCISES  Results after joint replacement surgery are often greatly improved when you follow the exercise, range of motion and muscle strengthening exercises prescribed by your doctor. Safety measures are also important to protect the joint from further injury. Any time any of these exercises cause you to have increased pain or swelling, decrease what you are doing until you are comfortable again and then slowly increase them. If you have problems or questions, call your caregiver or physical therapist for advice.   Rehabilitation is important following a joint replacement. After just a few days of immobilization, the muscles of the leg can become weakened and shrink (atrophy).  These exercises are designed to build up the tone and strength of the thigh and leg muscles and to improve motion. Often times heat used for twenty to thirty minutes before working out will loosen up your tissues and help with improving the range of motion but do not use heat for the first two weeks following surgery (sometimes heat can increase post-operative swelling).   These exercises can be done on a training (exercise) mat, on the floor, on a table or on a bed. Use whatever works the best and is most comfortable for you.    Use music or television while you are exercising so that the exercises are a pleasant break in your day. This will make your life  better with the exercises acting as a break in your routine that you can look forward to.   Perform all exercises about fifteen times, three times per day or as directed.  You should exercise both the operative leg and the other leg as well.  Exercises include:   Quad Sets - Tighten up the muscle on the front of the thigh (Quad) and hold for 5-10 seconds.   Straight Leg Raises - With your knee straight (if you were given a brace, keep it on), lift the leg to 60 degrees, hold  for 3 seconds, and slowly lower the leg.  Perform this exercise against resistance later as your leg gets stronger.  Leg Slides: Lying on your back, slowly slide your foot toward your buttocks, bending your knee up off the floor (only go as far as is comfortable). Then slowly slide your foot back down until your leg is flat on the floor again.  Angel Wings: Lying on your back spread your legs to the side as far apart as you can without causing discomfort.  Hamstring Strength:  Lying on your back, push your heel against the floor with your leg straight by tightening up the muscles of your buttocks.  Repeat, but this time bend your knee to a comfortable angle, and push your heel against the floor.  You may put a pillow under the heel to make it more comfortable if necessary.   A rehabilitation program following joint replacement surgery can speed recovery and prevent re-injury in the future due to weakened muscles. Contact your doctor or a physical therapist for more information on knee rehabilitation.   CONSTIPATION:  Constipation is defined medically as fewer than three stools per week and severe constipation as less than one stool per week.  Even if you have a regular bowel pattern at home, your normal regimen is likely to be disrupted due to multiple reasons following surgery.  Combination of anesthesia, postoperative narcotics, change in appetite and fluid intake all can affect your bowels.   YOU MUST use at least one of the following options; they are listed in order of increasing strength to get the job done.  They are all available over the counter, and you may need to use some, POSSIBLY even all of these options:    Drink plenty of fluids (prune juice may be helpful) and high fiber foods Colace 100 mg by mouth twice a day  Senokot for constipation as directed and as needed Dulcolax (bisacodyl), take with full glass of water   Miralax  (polyethylene glycol) once or twice a day as needed.  If you  have tried all these things and are unable to have a bowel movement in the first 3-4 days after surgery call either your surgeon or your primary doctor.    If you experience loose stools or diarrhea, hold the medications until you stool forms back up.  If your symptoms do not get better within 1 week or if they get worse, check with your doctor.  If you experience "the worst abdominal pain ever" or develop nausea or vomiting, please contact the office immediately for further recommendations for treatment.  ITCHING:  If you experience itching with your medications, try taking only a single pain pill, or even half a pain pill at a time.  You can also use Benadryl  over the counter for itching or also to help with sleep.   TED HOSE STOCKINGS:  Use stockings on both legs until for at least 2 weeks or  as directed by physician office. They may be removed at night for sleeping.  MEDICATIONS:  See your medication summary on the "After Visit Summary" that nursing will review with you.  You may have some home medications which will be placed on hold until you complete the course of blood thinner medication.  It is important for you to complete the blood thinner medication as prescribed.  Blood clot prevention (DVT Prophylaxis): After surgery you are at an increased risk for a blood clot. you were prescribed a blood thinner, Aspirin  81mg , to be taken twice daily in addition to your Plavix  taken daily for a total of 4 weeks from surgery to help reduce your risk of getting a blood clot.  Signs of a pulmonary embolus (blood clot in the lungs) include sudden short of breath, feeling lightheaded or dizzy, chest pain with a deep breath, rapid pulse rapid breathing.  Signs of a blood clot in your arms or legs include new unexplained swelling and cramping, warm, red or darkened skin around the painful area.  Please call the office or 911 right away if these signs or symptoms develop.  PRECAUTIONS:   If you experience chest  pain or shortness of breath - call 911 immediately for transfer to the hospital emergency department.   If you develop a fever greater that 101 F, purulent drainage from wound, increased redness or drainage from wound, foul odor from the wound/dressing, or calf pain - CONTACT YOUR SURGEON.                                                   FOLLOW-UP APPOINTMENTS:  If you do not already have a post-op appointment, please call the office for an appointment to be seen by your surgeon.  Guidelines for how soon to be seen are listed in your "After Visit Summary", but are typically between 2-3 weeks after surgery.  If you have a specialized bandage, you may be told to follow up 1 week after surgery.  OTHER INSTRUCTIONS:  Knee Replacement:  Do not place pillow under knee, focus on keeping the knee straight while resting.  Place foam block, curve side up under heel at all times except when walking.  DO NOT modify, tear, cut, or change the foam block in any way.  POST-OPERATIVE OPIOID TAPER INSTRUCTIONS: It is important to wean off of your opioid medication as soon as possible. If you do not need pain medication after your surgery it is ok to stop day one. Opioids include: Codeine, Hydrocodone (Norco, Vicodin), Oxycodone (Percocet, oxycontin ) and hydromorphone  amongst others.  Long term and even short term use of opiods can cause: Increased pain response Dependence Constipation Depression Respiratory depression And more.  Withdrawal symptoms can include Flu like symptoms Nausea, vomiting And more Techniques to manage these symptoms Hydrate well Eat regular healthy meals Stay active Use relaxation techniques(deep breathing, meditating, yoga) Do Not substitute Alcohol  to help with tapering If you have been on opioids for less than two weeks and do not have pain than it is ok to stop all together.  Plan to wean off of opioids This plan should start within one week post op of your joint  replacement. Maintain the same interval or time between taking each dose and first decrease the dose.  Cut the total daily intake of opioids by one tablet each day Next  start to increase the time between doses. The last dose that should be eliminated is the evening dose.   MAKE SURE YOU:  Understand these instructions.  Get help right away if you are not doing well or get worse.    Thank you for letting us  be a part of your medical care team.  It is a privilege we respect greatly.  We hope these instructions will help you stay on track for a fast and full recovery!

## 2023-04-01 NOTE — Evaluation (Signed)
 Physical Therapy Evaluation Patient Details Name: Marvin Carter MRN: [id number] DOB: 1955-04-12 Today's Date: 04/01/2023  History of Present Illness  68 yo male presents to therapy s/p L TKA on 03/31/2022 due to failure of conservative measures. Pt PMH includes but is not limited to: HTN, spinal stenosis, CKD III, GERD, diverticulosis, A-flutter, CAD s/p stent, B RTC repair, B bicept tendon repair, and R TKA (2014 and 2019).  Clinical Impression      Marvin Carter is a 68 y.o. male POD 0 s/p L TKA. Patient reports IND with mobility at baseline. Patient is now limited by functional impairments (see PT problem list below) and requires S for bed mobility and CGA and cues for transfers. Patient was able to ambulate 50 feet with RW and CGA level of assist. Patient instructed in exercise to facilitate ROM and circulation to manage edema. Patient will benefit from continued skilled PT interventions to address impairments and progress towards PLOF. Acute PT will follow to progress mobility and stair training in preparation for safe discharge home with family support and OPPT services scheduled for 2/13.     If plan is discharge home, recommend the following: A little help with walking and/or transfers;A little help with bathing/dressing/bathroom;Assistance with cooking/housework;Assist for transportation;Help with stairs or ramp for entrance   Can travel by private vehicle        Equipment Recommendations None recommended by PT  Recommendations for Other Services       Functional Status Assessment Patient has had a recent decline in their functional status and demonstrates the ability to make significant improvements in function in a reasonable and predictable amount of time.     Precautions / Restrictions Precautions Precautions: Knee;Fall Restrictions Weight Bearing Restrictions Per Provider Order: No      Mobility  Bed Mobility Overal bed mobility: Needs Assistance Bed Mobility:  Supine to Sit     Supine to sit: Supervision, HOB elevated, Used rails     General bed mobility comments: min cues    Transfers Overall transfer level: Needs assistance Equipment used: Rolling walker (2 wheels) Transfers: Sit to/from Stand Sit to Stand: Contact guard assist           General transfer comment: min cues    Ambulation/Gait Ambulation/Gait assistance: Contact guard assist Gait Distance (Feet): 50 Feet Assistive device: Rolling walker (2 wheels) Gait Pattern/deviations: Step-through pattern, Trunk flexed Gait velocity: slightly decreased     General Gait Details: trunk flexion attributed to hx of of LBP and pt unable to modify posture at time of eval, cues for safety and step to pattern with pt demonstrating a quick cadence with reciprocal pattern and LEs passing in stanace phase  Stairs            Wheelchair Mobility     Tilt Bed    Modified Rankin (Stroke Patients Only)       Balance Overall balance assessment: Needs assistance Sitting-balance support: Feet supported Sitting balance-Leahy Scale: Good     Standing balance support: Bilateral upper extremity supported, During functional activity, Reliant on assistive device for balance Standing balance-Leahy Scale: Poor                               Pertinent Vitals/Pain Pain Assessment Pain Assessment: 0-10 Pain Score: 4  Pain Location: L TKA and LBP Pain Descriptors / Indicators: Aching, Constant, Discomfort, Dull, Grimacing, Operative site guarding Pain Intervention(s): Limited activity within patient's tolerance, Monitored during  session, Premedicated before session, Repositioned, Ice applied    Home Living Family/patient expects to be discharged to:: Private residence Living Arrangements: Spouse/significant other Available Help at Discharge: Family Type of Home: House Home Access: Stairs to enter Entrance Stairs-Rails: None Entrance Stairs-Number of Steps: 3    Home Layout: Two level;Able to live on main level with bedroom/bathroom Home Equipment: Rolling Walker (2 wheels);Cane - single point;Crutches      Prior Function Prior Level of Function : Independent/Modified Independent;Driving             Mobility Comments: IND no AD for all ADLs, self care tasks and IADLs       Extremity/Trunk Assessment        Lower Extremity Assessment Lower Extremity Assessment: LLE deficits/detail LLE Deficits / Details: ankle DF/PF 5/5; SLR < 10 degree lag LLE Sensation: WNL    Cervical / Trunk Assessment Cervical / Trunk Assessment: Normal  Communication   Communication Communication: No apparent difficulties  Cognition Arousal: Alert Behavior During Therapy: WFL for tasks assessed/performed Overall Cognitive Status: Within Functional Limits for tasks assessed                                          General Comments      Exercises Total Joint Exercises Ankle Circles/Pumps: AROM, Both, 10 reps   Assessment/Plan    PT Assessment Patient needs continued PT services  PT Problem List Decreased strength;Decreased range of motion;Decreased activity tolerance;Decreased balance;Decreased mobility;Decreased coordination;Pain       PT Treatment Interventions DME instruction;Gait training;Stair training;Functional mobility training;Therapeutic activities;Therapeutic exercise;Balance training;Neuromuscular re-education;Patient/family education;Modalities    PT Goals (Current goals can be found in the Care Plan section)  Acute Rehab PT Goals Patient Stated Goal: walk, hike and go up and down stairs live a normal life and get back to playing pickleball PT Goal Formulation: With patient Time For Goal Achievement: 04/15/23 Potential to Achieve Goals: Good    Frequency 7X/week     Co-evaluation               AM-PAC PT "6 Clicks" Mobility  Outcome Measure Help needed turning from your back to your side while in a  flat bed without using bedrails?: None Help needed moving from lying on your back to sitting on the side of a flat bed without using bedrails?: None Help needed moving to and from a bed to a chair (including a wheelchair)?: A Little Help needed standing up from a chair using your arms (e.g., wheelchair or bedside chair)?: A Little Help needed to walk in hospital room?: A Little Help needed climbing 3-5 steps with a railing? : A Lot 6 Click Score: 19    End of Session Equipment Utilized During Treatment: Gait belt Activity Tolerance: Patient tolerated treatment well Patient left: in chair;with call bell/phone within reach;with chair alarm set Nurse Communication: Mobility status PT Visit Diagnosis: Unsteadiness on feet (R26.81);Other abnormalities of gait and mobility (R26.89);Muscle weakness (generalized) (M62.81);Difficulty in walking, not elsewhere classified (R26.2);Pain Pain - Right/Left: Left Pain - part of body: Leg;Knee    Time: 7829-5621 PT Time Calculation (min) (ACUTE ONLY): 27 min   Charges:   PT Evaluation $PT Eval Low Complexity: 1 Low PT Treatments $Gait Training: 8-22 mins PT General Charges $$ ACUTE PT VISIT: 1 Visit         Cary Clarks, PT Acute Rehab   Annalee Kiang  04/01/2023, 6:12 PM

## 2023-04-01 NOTE — Anesthesia Procedure Notes (Signed)
 Spinal  Patient location during procedure: OR Start time: 04/01/2023 10:34 AM End time: 04/01/2023 10:37 AM Reason for block: surgical anesthesia Staffing Performed: anesthesiologist  Anesthesiologist: Ellena Gurney, MD Performed by: Ellena Gurney, MD Authorized by: Ellena Gurney, MD   Preanesthetic Checklist Completed: patient identified, IV checked, risks and benefits discussed, surgical consent, monitors and equipment checked, pre-op evaluation and timeout performed Spinal Block Patient position: sitting Prep: DuraPrep Patient monitoring: cardiac monitor, continuous pulse ox and blood pressure Approach: midline Location: L3-4 Injection technique: single-shot Needle Needle type: Pencan  Needle gauge: 24 G Needle length: 9 cm Assessment Sensory level: T10 Events: CSF return Additional Notes Functioning IV was confirmed and monitors were applied. Sterile prep and drape, including hand hygiene and sterile gloves were used. The patient was positioned and the spine was prepped. The skin was anesthetized with lidocaine .  Free flow of clear CSF was obtained prior to injecting local anesthetic into the CSF.  The spinal needle aspirated freely following injection.  The needle was carefully withdrawn.  The patient tolerated the procedure well.

## 2023-04-01 NOTE — Progress Notes (Signed)
 Orthopedic Tech Progress Note Patient Details:  Marvin Carter [birth year] [id number]  Ortho Devices Type of Ortho Device: Bone foam zero knee Ortho Device/Splint Location: LLE Ortho Device/Splint Interventions: Ordered, Application   Post Interventions Patient Tolerated: Well Instructions Provided: Care of device Patient requested to have bone foam at this time, bone foam applied. Toi Foster 04/01/2023, 5:26 PM

## 2023-04-01 NOTE — Plan of Care (Signed)

## 2023-04-01 NOTE — Op Note (Signed)
 DATE OF SURGERY:  04/01/2023 TIME: 12:17 PM  PATIENT NAME:  Marvin Carter   AGE: 68 y.o.    PRE-OPERATIVE DIAGNOSIS: End-stage left knee osteoarthritis  POST-OPERATIVE DIAGNOSIS:  Same  PROCEDURE: Press-fit left total Knee Arthroplasty  SURGEON:  Jeferson Boozer A Dhyana Bastone, MD   ASSISTANT: Mason Sole, RNFA, present and scrubbed throughout the case, critical for assistance with exposure, retraction, instrumentation, and closure.   OPERATIVE IMPLANTS: Press-fit left size 9 CR femur, left G tibial baseplate, 14 mm MC poly insert, 35 mm press-fit patella Implant Name Type Inv. Item Serial No. Manufacturer Lot No. LRB No. Used Action  COMP PATELLA 3 PEG 35 - [id number] Joint COMP PATELLA 3 PEG 35  [employer](ORTH,TRAU,BIO,SG) [id number] Left 1 Implanted  COMP FEM KNEE STD PS 9 LT - [id number] Joint COMP FEM KNEE STD PS 9 LT  [employer](ORTH,TRAU,BIO,SG) [id number] Left 1 Implanted  COMP TIB PS G 0D LT - [id number] Joint COMP TIB PS G 0D LT  [employer](ORTH,TRAU,BIO,SG) [id number] Left 1 Implanted  LINER TIB ASF PS GH/8-11 14 LT - [id number] Liner LINER TIB ASF PS GH/8-11 14 LT  [employer](ORTH,TRAU,BIO,SG) [id number] Left 1 Implanted      PREOPERATIVE INDICATIONS:  Marvin Carter is a 68 y.o. year old male with end stage bone on bone degenerative arthritis of the knee who failed conservative treatment, including injections, antiinflammatories, activity modification, and assistive devices, and had significant impairment of their activities of daily living, and elected for Total Knee Arthroplasty.   The risks, benefits, and alternatives were discussed at length including but not limited to the risks of infection, bleeding, nerve injury, stiffness, blood clots, the need for revision surgery, cardiopulmonary complications, among others, and they were willing to proceed.  ESTIMATED BLOOD LOSS: 50cc  OPERATIVE DESCRIPTION:   Once adequate anesthesia was induced, preoperative antibiotics, 2  gm of ancef ,1 gm of Tranexamic Acid , and 8 mg of Decadron  administered, the patient was positioned supine with a left thigh tourniquet placed.  The left lower extremity was prepped and draped in sterile fashion.  A time-  out was performed identifying the patient, planned procedure, and the appropriate extremity.     The leg was  exsanguinated, tourniquet elevated to 250 mmHg.  A midline incision was  made followed by median parapatellar arthrotomy. Anterior horn of the medial meniscus was released and resected. A medial release was performed, the infrapatellar fat pad was resected with care taken to protect the patellar tendon. The suprapatellar fat was removed to exposed the distal anterior femur. The anterior horn of the lateral meniscus and ACL were released.    Following initial  exposure, I first started with the femur  The femoral  canal was opened with a drill, canal was suctioned to try to prevent fat emboli.  An  intramedullary rod was passed set at 5 degrees valgus, 10 mm. The distal femur was resected.  Following this resection, the tibia was  subluxated anteriorly.  Using the extramedullary guide, 10 mm of bone was resected off   the proximal lateral tibia.  We felt the extension gap appeared loose compared to the flexion space so elected to resect another 2 mm off the distal femur. we confirmed the gap would be  stable medially and laterally with a size 10mm spacer block as well as confirmed that the tibial cut was perpendicular in the coronal plane, checking with an alignment rod.    Once this was done, the posterior femoral referencing femoral sizer was placed under to  the posterior condyles with 3 degrees of external rotational which was parallel to the transepicondylar axis and perpendicular to [employer]. The femur was sized to be a size 9 in the anterior-  posterior dimension. The  anterior, posterior, and  chamfer cuts were made without difficulty nor   notching making certain that  I was along the anterior cortex to help  with flexion gap stability. Next a laminar spreader was placed with the knee in flexion and the medial lateral menisci were resected.  5 cc of the Exparel  mixture was injected in the medial side of the back of the knee and 3 cc in the lateral side.  1/2 inch curved osteotome was used to resect posterior osteophyte that was then removed with a pituitary rongeur.       At this point, the tibia was sized to be a size G.  The size G tray was  then pinned in position. Trial reduction was now carried with a 9 femur, G tibia, a 12 mm MC insert.  The knee had full extension and was stable to varus valgus stress in extension.  There was some medial laxity after all the osteophytes were removed elected to upsized to a 14 mm insert.  The knee was stable in flexion and the PCL was left intact  Attention was next directed to the patella.  Precut  measurement was noted to be 26 mm.  I resected down to 15 mm and used a  35 patellar button to restore patellar height as well as cover the cut surface.     The patella lug holes were drilled and a 35 mm patella poly trial was placed.    The knee was brought to full extension with good flexion stability with the patella tracking through the trochlea without application of pressure.    Next the femoral component was again assessed and determined to be seated and appropriately lateralized.  The femoral lug holes were drilled.  The femoral component was then removed. Tibial component was again assessed and felt to be seated and appropriately rotated with the medial third of the tubercle. The tibia was then drilled, and keel punched.     Final components were  opened and impacted into place.   The knee was irrigated with sterile Betadine  diluted in saline as well as pulse lavage normal saline. The synovial lining was  then injected a dilute Exparel  with 30cc of 0.25% marcaine  with epinephrine .     I confirmed that I was satisfied with  the range of motion and stability, and the final 14 mm MC poly insert was chosen.  It was placed into the knee.         The tourniquet had been let down.  No significant hemostasis was required.  The medial parapatellar arthrotomy was then reapproximated using #1 Stratafix sutures with the knee  in flexion.  The remaining wound was closed with 0 stratafix, 2-0 Vicryl, and running 3-0 Monocryl. The knee was cleaned, dried, dressed sterilely using Dermabond and   Aquacel dressing.  The patient was then brought to recovery room in stable condition, tolerating the procedure  well. There were no complications.   Post op recs: WB: WBAT Abx: ancef  Imaging: PACU xrays DVT prophylaxis: Aspirin  81mg  BID x4 weeks Follow up: 2 weeks after surgery for a wound check with Dr. Pryor Browning at [facility].  Address: [address], [place], [place] [address]  Office Phone: [phone]  Priscille Brought, MD  Orthopaedic Surgery

## 2023-04-01 NOTE — Transfer of Care (Signed)
 Immediate Anesthesia Transfer of Care Note  Patient: Marvin Carter  Procedure(s) Performed: TOTAL KNEE ARTHROPLASTY (Left: Knee)  Patient Location: PACU  Anesthesia Type:MAC and Spinal  Level of Consciousness: awake, alert , and oriented  Airway & Oxygen Therapy: Patient Spontanous Breathing  Post-op Assessment: Report given to RN  Post vital signs: Reviewed and stable  Last Vitals:  Vitals Value Taken Time  BP 106/66 04/01/23 1307  Temp    Pulse 63 04/01/23 1313  Resp 20 04/01/23 1313  SpO2 96 % 04/01/23 1313  Vitals shown include unfiled device data.  Last Pain:  Vitals:   04/01/23 1020  PainSc: 0-No pain         Complications: No notable events documented.

## 2023-04-02 ENCOUNTER — Encounter (HOSPITAL_COMMUNITY): Payer: Self-pay | Admitting: Orthopedic Surgery

## 2023-04-02 DIAGNOSIS — M1712 Unilateral primary osteoarthritis, left knee: Secondary | ICD-10-CM | POA: Diagnosis not present

## 2023-04-02 LAB — CBC
HCT: 33.5 % — ABNORMAL LOW (ref 39.0–52.0)
Hemoglobin: 11.2 g/dL — ABNORMAL LOW (ref 13.0–17.0)
MCH: 33.7 pg (ref 26.0–34.0)
MCHC: 33.4 g/dL (ref 30.0–36.0)
MCV: 100.9 fL — ABNORMAL HIGH (ref 80.0–100.0)
Platelets: 158 10*3/uL (ref 150–400)
RBC: 3.32 MIL/uL — ABNORMAL LOW (ref 4.22–5.81)
RDW: 12.1 % (ref 11.5–15.5)
WBC: 13.6 10*3/uL — ABNORMAL HIGH (ref 4.0–10.5)
nRBC: 0 % (ref 0.0–0.2)

## 2023-04-02 LAB — BASIC METABOLIC PANEL
Anion gap: 8 (ref 5–15)
BUN: 21 mg/dL (ref 8–23)
CO2: 21 mmol/L — ABNORMAL LOW (ref 22–32)
Calcium: 8.8 mg/dL — ABNORMAL LOW (ref 8.9–10.3)
Chloride: 105 mmol/L (ref 98–111)
Creatinine, Ser: 1.17 mg/dL (ref 0.61–1.24)
GFR, Estimated: >60 mL/min (ref >60)
Glucose, Bld: 157 mg/dL — ABNORMAL HIGH (ref 70–99)
Potassium: 4.5 mmol/L (ref 3.5–5.1)
Sodium: 134 mmol/L — ABNORMAL LOW (ref 135–145)

## 2023-04-02 MED ORDER — ASPIRIN 81 MG PO TBEC: 81.0000 mg | DELAYED_RELEASE_TABLET | Freq: Two times a day (BID) | ORAL | Status: AC

## 2023-04-02 MED ORDER — MELOXICAM 15 MG PO TABS
15.0000 mg | ORAL_TABLET | Freq: Every day | ORAL | 11 refills | Status: AC
Start: 2023-04-02 — End: 2024-04-01

## 2023-04-02 MED ORDER — ACETAMINOPHEN 500 MG PO TABS: 1000.0000 mg | ORAL_TABLET | Freq: Three times a day (TID) | ORAL | Status: AC | PRN

## 2023-04-02 MED ORDER — OXYCODONE HCL 5 MG PO TABS: 5.0000 mg | ORAL_TABLET | ORAL | 0 refills | Status: AC | PRN

## 2023-04-02 MED ORDER — METHOCARBAMOL 500 MG PO TABS: 500.0000 mg | ORAL_TABLET | Freq: Three times a day (TID) | ORAL | 0 refills | Status: AC | PRN

## 2023-04-02 NOTE — Progress Notes (Signed)
Physical Therapy Treatment Patient Details Name: Marvin Carter MRN: [id number] DOB: 06/03/1955 Today's Date: 04/02/2023   History of Present Illness 68 yo male presents to therapy s/p L TKA on 03/31/2022 due to failure of conservative measures. Pt PMH includes but is not limited to: HTN, spinal stenosis, CKD III, GERD, diverticulosis, A-flutter, CAD s/p stent, B RTC repair, B bicept tendon repair, and R TKA (2014 and 2019).    PT Comments  Marvin Carter is a 68 y.o. male POD 1 s/p L TKA. Patient reports IND with mobility at baseline. Patient is now limited by functional impairments (see PT problem list below) and requires S for transfers and gait with RW. Patient was able to ambulate 125 feet x 2  with RW and S and cues for safe walker management. Patient educated on safe sequencing for stair mobility with use of crutches, pain management and goal and use of ice man machine/CP pt and spouse verbalized understanding of safe guarding position for people assisting with mobility. Patient instructed in exercises to facilitate ROM and circulation reviewed and HO provided. Patient will benefit from continued skilled PT interventions to address impairments and progress towards PLOF. Patient has met mobility goals at adequate level for discharge home with family support and OPPT therapy services; will continue to follow if pt continues acute stay to progress towards Mod I goals.    If plan is discharge home, recommend the following: A little help with walking and/or transfers;A little help with bathing/dressing/bathroom;Assistance with cooking/housework;Assist for transportation;Help with stairs or ramp for entrance   Can travel by private vehicle        Equipment Recommendations  None recommended by PT    Recommendations for Other Services       Precautions / Restrictions Precautions Precautions: Knee;Fall Restrictions Weight Bearing Restrictions Per Provider Order: No     Mobility  Bed  Mobility Overal bed mobility: Modified Independent Bed Mobility: Supine to Sit     Supine to sit: Modified independent (Device/Increase time)     General bed mobility comments: HOB elevated    Transfers Overall transfer level: Needs assistance Equipment used: Rolling walker (2 wheels) Transfers: Sit to/from Stand Sit to Stand: Supervision           General transfer comment: min cues for safety    Ambulation/Gait Ambulation/Gait assistance: Supervision Gait Distance (Feet): 125 Feet Assistive device: Rolling walker (2 wheels) Gait Pattern/deviations: Step-through pattern, Trunk flexed Gait velocity: slightly decreased     General Gait Details: trunk flexion attributed to hx of of LBP and pt unable to modify posture while ambulating,  cues for safety with pt demonstrating a quick cadence with reciprocal pattern and LEs passing in stanace phase   Stairs Stairs: Yes Stairs assistance: Supervision, Contact guard assist Stair Management: With crutches Number of Stairs: 3 General stair comments: min cues for safety and squencing with use of crutches, pt reports and spouse confirms use of crutches s/p R TKA. pt demonsterated mild LOB with first step and no further LOB and able to demonstrate proper technique and sequencing with min cues   Wheelchair Mobility     Tilt Bed    Modified Rankin (Stroke Patients Only)       Balance Overall balance assessment: Needs assistance Sitting-balance support: Feet supported Sitting balance-Leahy Scale: Good     Standing balance support: Bilateral upper extremity supported, During functional activity, Reliant on assistive device for balance Standing balance-Leahy Scale: Fair Standing balance comment: static standing no UE support  Communication Communication Communication: No apparent difficulties  Cognition Arousal: Alert Behavior During Therapy: WFL for tasks assessed/performed   PT  - Cognitive impairments: No apparent impairments                         Following commands: Intact      Cueing    Exercises Total Joint Exercises Ankle Circles/Pumps: AROM, Both, 10 reps Quad Sets: AROM, Left, 5 reps Short Arc Quad: AROM, Left, 5 reps Heel Slides: AROM, Left, 5 reps Hip ABduction/ADduction: AROM, Left, 5 reps Straight Leg Raises: AROM, Left, 5 reps Knee Flexion: AROM, Left, 5 reps, Seated    General Comments        Pertinent Vitals/Pain Pain Assessment Pain Assessment: 0-10 Pain Score: 6  Pain Location: L TKA and LBP Pain Descriptors / Indicators: Aching, Constant, Discomfort, Dull, Grimacing, Operative site guarding Pain Intervention(s): Limited activity within patient's tolerance, Monitored during session, Premedicated before session, Repositioned, Ice applied    Home Living                          Prior Function            PT Goals (current goals can now be found in the care plan section) Acute Rehab PT Goals Patient Stated Goal: walk, hike and go up and down stairs live a normal life and get back to playing pickleball PT Goal Formulation: With patient Time For Goal Achievement: 04/15/23 Potential to Achieve Goals: Good Progress towards PT goals: Goals met/education completed, patient discharged from PT;Progressing toward goals    Frequency    7X/week      PT Plan      Co-evaluation              AM-PAC PT "6 Clicks" Mobility   Outcome Measure  Help needed turning from your back to your side while in a flat bed without using bedrails?: None Help needed moving from lying on your back to sitting on the side of a flat bed without using bedrails?: None Help needed moving to and from a bed to a chair (including a wheelchair)?: A Little Help needed standing up from a chair using your arms (e.g., wheelchair or bedside chair)?: A Little Help needed to walk in hospital room?: A Little Help needed climbing 3-5 steps  with a railing? : A Lot 6 Click Score: 19    End of Session Equipment Utilized During Treatment: Gait belt Activity Tolerance: Patient tolerated treatment well Patient left: in chair;with call bell/phone within reach Nurse Communication: Mobility status PT Visit Diagnosis: Unsteadiness on feet (R26.81);Other abnormalities of gait and mobility (R26.89);Muscle weakness (generalized) (M62.81);Difficulty in walking, not elsewhere classified (R26.2);Pain Pain - Right/Left: Left Pain - part of body: Leg;Knee     Time: 4034-7425 PT Time Calculation (min) (ACUTE ONLY): 25 min  Charges:    $Gait Training: 8-22 mins $Therapeutic Exercise: 8-22 mins PT General Charges $$ ACUTE PT VISIT: 1 Visit                     Johnny Bridge, PT Acute Rehab    Jacqualyn Posey 04/02/2023, 11:19 AM

## 2023-04-02 NOTE — Discharge Summary (Signed)
Physician Discharge Summary  Patient ID: Marvin Carter MRN: [id number] DOB/AGE: December 04, 1955 68 y.o.  Admit date: 04/01/2023 Discharge date: 04/02/2023  Admission Diagnoses:  Primary osteoarthritis of left knee  Discharge Diagnoses:  Principal Problem:   Primary osteoarthritis of left knee   Past Medical History:  Diagnosis Date   Anxiety    Atrial flutter, paroxysmal (HCC) 01/04/2011   Benign hypertensive heart and CKD, stage 3 (GFR 30-59), w CHF (HCC) 10/13/2017   CAD (coronary artery disease), native coronary artery 01/04/2011   Diverticulitis    Diverticulosis    Drug-induced erectile dysfunction 03/01/2015   Essential hypertension    hypertension     GERD (gastroesophageal reflux disease)    Hemorrhoids, internal, with bleeding 11/08/2010   History of meniscectomy of right knee 01/02/2011   Hypertension    NSVT (nonsustained ventricular tachycardia) (HCC) 01/02/2011   Obesity (BMI 30.0-34.9) 01/10/2018   Other abnormal glucose (hx of prediabetes) 04/30/2013   5.7% in 2012, 5.8% in 2015     Primary osteoarthritis of right knee 04/03/2017   Prolapsed external hemorrhoids 10/19/2010   Skin cancer    Nose and ear   Spinal stenosis of lumbar region 07/28/2018   Stented coronary artery 01/04/2011   LAD DES Resolute.   Testosterone deficiency 04/30/2013   Vitamin D deficiency 04/30/2013    Surgeries: Procedure(s): TOTAL KNEE ARTHROPLASTY on 04/01/2023   Consultants (if any):   Discharged Condition: Improved  Hospital Course: Finlay Godbee is an 68 y.o. male who was admitted 04/01/2023 with a diagnosis of Primary osteoarthritis of left knee and went to the operating room on 04/01/2023 and underwent the above named procedures.    He was given perioperative antibiotics:  Anti-infectives (From admission, onward)    Start     Dose/Rate Route Frequency Ordered Stop   04/01/23 1630  ceFAZolin (ANCEF) IVPB 2g/100 mL premix        2 g 200 mL/hr over 30 Minutes Intravenous  Every 6 hours 04/01/23 1415 04/01/23 2202   04/01/23 0900  ceFAZolin (ANCEF) IVPB 2g/100 mL premix        2 g 200 mL/hr over 30 Minutes Intravenous On call to O.R. 04/01/23 4098 04/01/23 1042     .  He was given sequential compression devices, early ambulation, and aspirin and plavix for DVT prophylaxis.  He benefited maximally from the hospital stay and there were no complications.    Recent vital signs:  Vitals:   04/02/23 0118 04/02/23 0403  BP: 119/85 117/72  Pulse: 81 82  Resp: 17 17  Temp: 98.5 F (36.9 C) 98 F (36.7 C)  SpO2: 96% 96%    Recent laboratory studies:  Lab Results  Component Value Date   HGB 11.2 (L) 04/02/2023   HGB 14.1 03/20/2023   HGB 13.1 (L) 01/28/2023   Lab Results  Component Value Date   WBC 13.6 (H) 04/02/2023   PLT 158 04/02/2023   Lab Results  Component Value Date   INR 1.01 04/04/2017   Lab Results  Component Value Date   NA 134 (L) 04/02/2023   K 4.5 04/02/2023   CL 105 04/02/2023   CO2 21 (L) 04/02/2023   BUN 21 04/02/2023   CREATININE 1.17 04/02/2023   GLUCOSE 157 (H) 04/02/2023    Discharge Medications:   Allergies as of 04/02/2023       Reactions   Hctz [hydrochlorothiazide]    Unknown reaction, pt does not recall having a reaction to this medication   Tape Rash  Clear tape        Medication List     STOP taking these medications    cyclobenzaprine 10 MG tablet Commonly known as: FLEXERIL       TAKE these medications    acetaminophen 500 MG tablet Commonly known as: TYLENOL Take 2 tablets (1,000 mg total) by mouth every 8 (eight) hours as needed. What changed:  medication strength how much to take when to take this reasons to take this   aspirin EC 81 MG tablet Take 1 tablet (81 mg total) by mouth 2 (two) times daily for 28 days. Swallow whole. Start taking on: April 03, 2023   atenolol 100 MG tablet Commonly known as: TENORMIN TAKE 1 TABLET BY MOUTH DAILY FOR BLOOD PRESSURE    atorvastatin 40 MG tablet Commonly known as: LIPITOR Take  1 tablet  Daily  for Cholesterol                                                                      /                                           TAKE                                                     BY                                               MOUTH   b complex vitamins capsule Take 1 capsule by mouth daily.   buPROPion 300 MG 24 hr tablet Commonly known as: WELLBUTRIN XL TAKE 1 TABLET BY MOUTH EVERY MORNING FOR MOOD,FOCUS AND CONCENTRATION   cholecalciferol 25 MCG (1000 UNIT) tablet Commonly known as: VITAMIN D3 Take 1,000 Units by mouth daily.   clopidogrel 75 MG tablet Commonly known as: PLAVIX Take 1 tablet Daily to prevent Blood Clots   diclofenac sodium 1 % Gel Commonly known as: VOLTAREN Apply 2 to 4 grams 2 to 4 x / day   diltiazem 240 MG 24 hr tablet Commonly known as: CARDIZEM LA Take 1/2 tablet (120 mg) every day for BP                                                /                                                                   TAKE  BY                                                 MOUTH   doxazosin 8 MG tablet Commonly known as: CARDURA Take  1/2 tablet (4 mg)  at Bedtime  for BP & Prostate                                                /                                                                   TAKE                                         BY                                                 MOUTH   furosemide 80 MG tablet Commonly known as: LASIX TAKE 1 TABLET BY MOUTH EVERY DAY FOR BLOOD PRESSURE AND FLUID RETENTION OR ANKLE SWELLING   Magnesium 400 MG Caps Take 800 mg by mouth daily.   meloxicam 15 MG tablet Commonly known as: MOBIC Take 1 tablet (15 mg total) by mouth daily. What changed:  how much to take how to take this when to take this additional instructions   methocarbamol 500 MG tablet Commonly known as: ROBAXIN Take 1 tablet  (500 mg total) by mouth every 8 (eight) hours as needed for up to 10 days for muscle spasms.   multivitamin with minerals Tabs tablet Take 2 tablets by mouth daily. Mega Men Multivitamin.   nitroGLYCERIN 0.4 MG SL tablet Commonly known as: NITROSTAT Dissolve 1 tablet under tongue every 3 to 5 minutes as needed for Chest Pain ( Do NOT take with Cialis )   olmesartan 40 MG tablet Commonly known as: BENICAR Take 1/2  to 1 tablet Daily for BP   oxyCODONE 5 MG immediate release tablet Commonly known as: Roxicodone Take 1 tablet (5 mg total) by mouth every 4 (four) hours as needed for up to 7 days for severe pain (pain score 7-10) or moderate pain (pain score 4-6).   pantoprazole 40 MG tablet Commonly known as: PROTONIX Take  1 tablet  Daily to Prevent Heartburn & Indigestion                                                                        /  TAKE                                                          BY                                                        MOUTH   tadalafil 20 MG tablet Commonly known as: Cialis Take  1/2 to 1 tablet  every 2 to 3 days  as needed for XXXX   tamsulosin 0.4 MG Caps capsule Commonly known as: FLOMAX Take 2 capsules (0.8 mg) at Bedtime for Prostate                                 /                                                                   TAKE                                         BY                                                 MOUTH   traZODone 150 MG tablet Commonly known as: DESYREL TAKE 1 TABLET(150 MG) BY MOUTH AT BEDTIME   vitamin E 180 MG (400 UNITS) capsule Take 400 Units by mouth daily.   zinc gluconate 50 MG tablet Take 50 mg by mouth daily.        Diagnostic Studies: DG Knee Left Port Result Date: 04/01/2023 CLINICAL DATA:  Postoperative left knee EXAM: PORTABLE LEFT KNEE - 1-2 VIEW COMPARISON:  None Available. FINDINGS: Status post recent total left knee arthroplasty. No perihardware lucency  is seen to indicate hardware failure or loosening. Expected postoperative changes including intra-articular and subcutaneous air. Moderatejoint effusion. Moderate medial and lateral knee chronic ossicles. No acute fracture or dislocation. IMPRESSION: Status post recent total left knee arthroplasty without evidence of hardware failure. Electronically Signed   By: Neita Garnet M.D.   On: 04/01/2023 16:22    Disposition: Discharge disposition: 01-Home or Self Care       Discharge Instructions     Call MD / Call 911   Complete by: As directed    If you experience chest pain or shortness of breath, CALL 911 and be transported to the hospital emergency room.  If you develope a fever above 101 F, pus (white drainage) or increased drainage or redness at the wound, or calf pain, call your surgeon's office.   Constipation Prevention   Complete by: As directed    Drink plenty of fluids.  Prune juice may be helpful.  You may use a stool softener, such as Colace (over the counter) 100 mg twice a day.  Use MiraLax (over the counter) for constipation as needed.   Diet - low sodium heart healthy   Complete by: As directed    Increase activity slowly as tolerated   Complete by: As directed    Post-operative opioid taper instructions:   Complete by: As directed    POST-OPERATIVE OPIOID TAPER INSTRUCTIONS: It is important to wean off of your opioid medication as soon as possible. If you do not need pain medication after your surgery it is ok to stop day one. Opioids include: Codeine, Hydrocodone(Norco, Vicodin), Oxycodone(Percocet, oxycontin) and hydromorphone amongst others.  Long term and even short term use of opiods can cause: Increased pain response Dependence Constipation Depression Respiratory depression And more.  Withdrawal symptoms can include Flu like symptoms Nausea, vomiting And more Techniques to manage these symptoms Hydrate well Eat regular healthy meals Stay active Use  relaxation techniques(deep breathing, meditating, yoga) Do Not substitute Alcohol to help with tapering If you have been on opioids for less than two weeks and do not have pain than it is ok to stop all together.  Plan to wean off of opioids This plan should start within one week post op of your joint replacement. Maintain the same interval or time between taking each dose and first decrease the dose.  Cut the total daily intake of opioids by one tablet each day Next start to increase the time between doses. The last dose that should be eliminated is the evening dose.           Follow-up Information     [name], MD Follow up in 2 week(s).   Specialty: Orthopedic Surgery Contact information: [address] [place] [address] [phone]                    Discharge Instructions      INSTRUCTIONS AFTER JOINT REPLACEMENT   Remove items at home which could result in a fall. This includes throw rugs or furniture in walking pathways ICE to the affected joint every three hours while awake for 30 minutes at a time, for at least the first 3-5 days, and then as needed for pain and swelling.  Continue to use ice for pain and swelling. You may notice swelling that will progress down to the foot and ankle.  This is normal after surgery.  Elevate your leg when you are not up walking on it.   Continue to use the breathing machine you got in the hospital (incentive spirometer) which will help keep your temperature down.  It is common for your temperature to cycle up and down following surgery, especially at night when you are not up moving around and exerting yourself.  The breathing machine keeps your lungs expanded and your temperature down.  DIET:  As you were doing prior to hospitalization, we recommend a well-balanced diet.  DRESSING / WOUND CARE / SHOWERING:  Keep the surgical dressing until follow up.  The dressing is water proof, so you can shower  without any extra covering.  IF THE DRESSING FALLS OFF or the wound gets wet inside, change the dressing with sterile gauze.  Please use good hand washing techniques before changing the dressing.  Do not use any lotions or creams on the incision until instructed by your surgeon.    ACTIVITY  Increase activity slowly as  tolerated, but follow the weight bearing instructions below.   No driving for 6 weeks or until further direction given by your physician.  You cannot drive while taking narcotics.  No lifting or carrying greater than 10 lbs. until further directed by your surgeon. Avoid periods of inactivity such as sitting longer than an hour when not asleep. This helps prevent blood clots.  You may return to work once you are authorized by your doctor.   WEIGHT BEARING: Weight bearing as tolerated with assist device (walker, cane, etc) as directed, use it as long as suggested by your surgeon or therapist, typically at least 4-6 weeks.  EXERCISES  Results after joint replacement surgery are often greatly improved when you follow the exercise, range of motion and muscle strengthening exercises prescribed by your doctor. Safety measures are also important to protect the joint from further injury. Any time any of these exercises cause you to have increased pain or swelling, decrease what you are doing until you are comfortable again and then slowly increase them. If you have problems or questions, call your caregiver or physical therapist for advice.   Rehabilitation is important following a joint replacement. After just a few days of immobilization, the muscles of the leg can become weakened and shrink (atrophy).  These exercises are designed to build up the tone and strength of the thigh and leg muscles and to improve motion. Often times heat used for twenty to thirty minutes before working out will loosen up your tissues and help with improving the range of motion but do not use heat for the first two  weeks following surgery (sometimes heat can increase post-operative swelling).   These exercises can be done on a training (exercise) mat, on the floor, on a table or on a bed. Use whatever works the best and is most comfortable for you.    Use music or television while you are exercising so that the exercises are a pleasant break in your day. This will make your life better with the exercises acting as a break in your routine that you can look forward to.   Perform all exercises about fifteen times, three times per day or as directed.  You should exercise both the operative leg and the other leg as well.  Exercises include:   Quad Sets - Tighten up the muscle on the front of the thigh (Quad) and hold for 5-10 seconds.   Straight Leg Raises - With your knee straight (if you were given a brace, keep it on), lift the leg to 60 degrees, hold for 3 seconds, and slowly lower the leg.  Perform this exercise against resistance later as your leg gets stronger.  Leg Slides: Lying on your back, slowly slide your foot toward your buttocks, bending your knee up off the floor (only go as far as is comfortable). Then slowly slide your foot back down until your leg is flat on the floor again.  Angel Wings: Lying on your back spread your legs to the side as far apart as you can without causing discomfort.  Hamstring Strength:  Lying on your back, push your heel against the floor with your leg straight by tightening up the muscles of your buttocks.  Repeat, but this time bend your knee to a comfortable angle, and push your heel against the floor.  You may put a pillow under the heel to make it more comfortable if necessary.   A rehabilitation program following joint replacement surgery can speed recovery  and prevent re-injury in the future due to weakened muscles. Contact your doctor or a physical therapist for more information on knee rehabilitation.   CONSTIPATION:  Constipation is defined medically as fewer than  three stools per week and severe constipation as less than one stool per week.  Even if you have a regular bowel pattern at home, your normal regimen is likely to be disrupted due to multiple reasons following surgery.  Combination of anesthesia, postoperative narcotics, change in appetite and fluid intake all can affect your bowels.   YOU MUST use at least one of the following options; they are listed in order of increasing strength to get the job done.  They are all available over the counter, and you may need to use some, POSSIBLY even all of these options:    Drink plenty of fluids (prune juice may be helpful) and high fiber foods Colace 100 mg by mouth twice a day  Senokot for constipation as directed and as needed Dulcolax (bisacodyl), take with full glass of water  Miralax (polyethylene glycol) once or twice a day as needed.  If you have tried all these things and are unable to have a bowel movement in the first 3-4 days after surgery call either your surgeon or your primary doctor.    If you experience loose stools or diarrhea, hold the medications until you stool forms back up.  If your symptoms do not get better within 1 week or if they get worse, check with your doctor.  If you experience "the worst abdominal pain ever" or develop nausea or vomiting, please contact the office immediately for further recommendations for treatment.  ITCHING:  If you experience itching with your medications, try taking only a single pain pill, or even half a pain pill at a time.  You can also use Benadryl over the counter for itching or also to help with sleep.   TED HOSE STOCKINGS:  Use stockings on both legs until for at least 2 weeks or as directed by physician office. They may be removed at night for sleeping.  MEDICATIONS:  See your medication summary on the "After Visit Summary" that nursing will review with you.  You may have some home medications which will be placed on hold until you complete the  course of blood thinner medication.  It is important for you to complete the blood thinner medication as prescribed.  Blood clot prevention (DVT Prophylaxis): After surgery you are at an increased risk for a blood clot. you were prescribed a blood thinner, Aspirin 81mg , to be taken twice daily in addition to your Plavix taken daily for a total of 4 weeks from surgery to help reduce your risk of getting a blood clot.  Signs of a pulmonary embolus (blood clot in the lungs) include sudden short of breath, feeling lightheaded or dizzy, chest pain with a deep breath, rapid pulse rapid breathing.  Signs of a blood clot in your arms or legs include new unexplained swelling and cramping, warm, red or darkened skin around the painful area.  Please call the office or 911 right away if these signs or symptoms develop.  PRECAUTIONS:   If you experience chest pain or shortness of breath - call 911 immediately for transfer to the hospital emergency department.   If you develop a fever greater that 101 F, purulent drainage from wound, increased redness or drainage from wound, foul odor from the wound/dressing, or calf pain - CONTACT YOUR SURGEON.  FOLLOW-UP APPOINTMENTS:  If you do not already have a post-op appointment, please call the office for an appointment to be seen by your surgeon.  Guidelines for how soon to be seen are listed in your "After Visit Summary", but are typically between 2-3 weeks after surgery.  If you have a specialized bandage, you may be told to follow up 1 week after surgery.  OTHER INSTRUCTIONS:  Knee Replacement:  Do not place pillow under knee, focus on keeping the knee straight while resting.  Place foam block, curve side up under heel at all times except when walking.  DO NOT modify, tear, cut, or change the foam block in any way.  POST-OPERATIVE OPIOID TAPER INSTRUCTIONS: It is important to wean off of your opioid medication as soon as  possible. If you do not need pain medication after your surgery it is ok to stop day one. Opioids include: Codeine, Hydrocodone(Norco, Vicodin), Oxycodone(Percocet, oxycontin) and hydromorphone amongst others.  Long term and even short term use of opiods can cause: Increased pain response Dependence Constipation Depression Respiratory depression And more.  Withdrawal symptoms can include Flu like symptoms Nausea, vomiting And more Techniques to manage these symptoms Hydrate well Eat regular healthy meals Stay active Use relaxation techniques(deep breathing, meditating, yoga) Do Not substitute Alcohol to help with tapering If you have been on opioids for less than two weeks and do not have pain than it is ok to stop all together.  Plan to wean off of opioids This plan should start within one week post op of your joint replacement. Maintain the same interval or time between taking each dose and first decrease the dose.  Cut the total daily intake of opioids by one tablet each day Next start to increase the time between doses. The last dose that should be eliminated is the evening dose.   MAKE SURE YOU:  Understand these instructions.  Get help right away if you are not doing well or get worse.    Thank you for letting us be a part of your medical care team.  It is a privilege we respect greatly.  We hope these instructions will help you stay on track for a fast and full recovery!            Signed: Angelino Rumery A Cannen Dupras 04/02/2023, 7:38 AM

## 2023-04-02 NOTE — Progress Notes (Signed)
     Subjective:  Patient reports pain as mild.  Did great with PT yesterday. Plan for  DC home today. No new issues.  Objective:   VITALS:   Vitals:   04/01/23 1625 04/01/23 2140 04/02/23 0118 04/02/23 0403  BP: 105/74 111/81 119/85 117/72  Pulse: 66 87 81 82  Resp: 17 16 17 17   Temp: 98.2 F (36.8 C) 98.4 F (36.9 C) 98.5 F (36.9 C) 98 F (36.7 C)  TempSrc: Oral Oral Oral Oral  SpO2: 97% 97% 96% 96%  Weight:      Height:        Sensation intact distally Intact pulses distally Dorsiflexion/Plantar flexion intact Incision: dressing C/D/I No cellulitis present    Lab Results  Component Value Date   WBC 13.6 (H) 04/02/2023   HGB 11.2 (L) 04/02/2023   HCT 33.5 (L) 04/02/2023   MCV 100.9 (H) 04/02/2023   PLT 158 04/02/2023   BMET    Component Value Date/Time   NA 134 (L) 04/02/2023 0306   K 4.5 04/02/2023 0306   CL 105 04/02/2023 0306   CO2 21 (L) 04/02/2023 0306   GLUCOSE 157 (H) 04/02/2023 0306   BUN 21 04/02/2023 0306   CREATININE 1.17 04/02/2023 0306   CREATININE 1.18 01/28/2023 1039   CALCIUM 8.8 (L) 04/02/2023 0306   EGFR 67 01/28/2023 1039   GFRNONAA >60 04/02/2023 0306   GFRNONAA 60 05/17/2020 1006      Xray: TKA components in good position no adverse features.  Assessment/Plan: 1 Day Post-Op   Principal Problem:   Primary osteoarthritis of left knee  S/p L TKA 04/01/23  Post op recs: WB: WBAT Abx: ancef Imaging: PACU xrays DVT prophylaxis: Aspirin 81mg  BID x4 weeks Follow up: 2 weeks after surgery for a wound check with Dr. Blanchie Dessert at [facility].  Address: [address], [place], [place] [address]  Office Phone: [phone]   [name] 04/02/2023, 7:32 AM   Weber Cooks, MD  Contact information:   [phone] 7am-5pm epic message Dr. Blanchie Dessert, or call office for patient follow up: [phone] After hours and holidays please check [email or web address] for group call information for [facility]

## 2023-04-02 NOTE — TOC Transition Note (Signed)
Transition of Care [facility]) - Discharge Note   Patient Details  Name: Marvin Carter MRN: [id number] Date of Birth: 05-31-1955  [facility]) CM/SW Contact:  Amada Jupiter, LCSW Phone Number: 04/02/2023, 10:03 AM   Clinical Narrative:     Met with pt who confirms he has needed DME in the home.  OPPT already arranged with [facility].  No further TOC needs.  Final next level of care: OP Rehab Barriers to Discharge: No Barriers Identified   Patient Goals and CMS Choice Patient states their goals for this hospitalization and ongoing recovery are:: return home          Discharge Placement                       Discharge Plan and Services Additional resources added to the After Visit Summary for                  DME Arranged: N/A DME Agency: NA                  Social Drivers of Health (SDOH) Interventions SDOH Screenings   Food Insecurity: No Food Insecurity (04/01/2023)  Housing: Unknown (04/01/2023)  Transportation Needs: No Transportation Needs (04/01/2023)  Utilities: Not At Risk (04/01/2023)  Depression (PHQ2-9): Low Risk  (01/27/2023)  Social Connections: Socially Integrated (04/01/2023)  Tobacco Use: Low Risk  (04/01/2023)     Readmission Risk Interventions     No data to display

## 2023-04-03 DIAGNOSIS — M1712 Unilateral primary osteoarthritis, left knee: Secondary | ICD-10-CM | POA: Diagnosis not present

## 2023-04-04 ENCOUNTER — Encounter (HOSPITAL_COMMUNITY): Payer: Self-pay | Admitting: Orthopedic Surgery

## 2023-04-04 NOTE — Anesthesia Postprocedure Evaluation (Signed)
Anesthesia Post Note  Patient: Marvin Carter  Procedure(s) Performed: TOTAL KNEE ARTHROPLASTY (Left: Knee)     Patient location during evaluation: PACU Anesthesia Type: Regional, Spinal and MAC Level of consciousness: oriented and awake and alert Pain management: pain level controlled Vital Signs Assessment: post-procedure vital signs reviewed and stable Respiratory status: spontaneous breathing, respiratory function stable and patient connected to nasal cannula oxygen Cardiovascular status: blood pressure returned to baseline and stable Postop Assessment: no headache, no backache and no apparent nausea or vomiting Anesthetic complications: no   No notable events documented.  Last Vitals:  Vitals:   04/02/23 0118 04/02/23 0403  BP: 119/85 117/72  Pulse: 81 82  Resp: 17 17  Temp: 36.9 C 36.7 C  SpO2: 96% 96%    Last Pain:  Vitals:   04/02/23 0938  TempSrc:   PainSc: 3                  Duel Conrad S

## 2023-04-29 ENCOUNTER — Ambulatory Visit: Payer: 59 | Admitting: Nurse Practitioner

## 2023-07-11 ENCOUNTER — Encounter: Payer: 59 | Admitting: Internal Medicine

## 2023-08-01 ENCOUNTER — Encounter: Payer: Medicare Other | Admitting: Internal Medicine

## 2023-10-04 ENCOUNTER — Other Ambulatory Visit: Payer: Self-pay | Admitting: Orthopedic Surgery

## 2023-10-04 DIAGNOSIS — M47816 Spondylosis without myelopathy or radiculopathy, lumbar region: Secondary | ICD-10-CM

## 2023-10-04 DIAGNOSIS — M549 Dorsalgia, unspecified: Secondary | ICD-10-CM

## 2023-10-04 DIAGNOSIS — M48061 Spinal stenosis, lumbar region without neurogenic claudication: Secondary | ICD-10-CM

## 2023-10-07 ENCOUNTER — Inpatient Hospital Stay
Admission: RE | Admit: 2023-10-07 | Discharge: 2023-10-07 | Discharge status: Home / Self Care | Source: Ambulatory Visit | Attending: Orthopedic Surgery | Admitting: Orthopedic Surgery

## 2023-10-07 DIAGNOSIS — M48061 Spinal stenosis, lumbar region without neurogenic claudication: Secondary | ICD-10-CM

## 2023-10-07 DIAGNOSIS — M549 Dorsalgia, unspecified: Secondary | ICD-10-CM

## 2023-10-07 DIAGNOSIS — M47816 Spondylosis without myelopathy or radiculopathy, lumbar region: Secondary | ICD-10-CM

## 2023-10-24 ENCOUNTER — Telehealth: Payer: Self-pay

## 2023-10-24 NOTE — Telephone Encounter (Signed)
   Pre-operative Risk Assessment    Patient Name: Marvin Carter  DOB: 09-12-1955 MRN: [id number]   Date of last office visit: 01/10/23 Date of next office visit: N/A   Request for Surgical Clearance    Procedure:  L3-5 lami PSF/TUF  Date of Surgery:  Clearance TBD                                Surgeon:  Royden Schneider, MD Surgeon's Group or Practice Name:  [facility] Phone number:  [phone] Fax number:  [phone]   Type of Clearance Requested:   - Pharmacy:  Hold Clopidogrel  (Plavix ) 7 days   Type of Anesthesia:  General    Additional requests/questions:    Bonney Annabella LITTIE Dorlene   10/24/2023, 2:46 PM

## 2023-10-25 ENCOUNTER — Telehealth: Payer: Self-pay

## 2023-10-25 NOTE — Telephone Encounter (Signed)
 Med Rec and Consent done.

## 2023-10-25 NOTE — Telephone Encounter (Signed)
 S/W pt and scheduled TELE Preop appt 10/31/23  Will update surgeons office.

## 2023-10-25 NOTE — Telephone Encounter (Signed)
 Med Rec and Consent done     Patient Consent for Virtual Visit        Marvin Carter has provided verbal consent on 10/25/2023 for a virtual visit (video or telephone).   CONSENT FOR VIRTUAL VISIT FOR:  Marvin Carter  By participating in this virtual visit I agree to the following:  I hereby voluntarily request, consent and authorize [facility] and its employed or contracted physicians, physician assistants, nurse practitioners or other licensed health care professionals (the Practitioner), to provide me with telemedicine health care services (the "Services) as deemed necessary by the treating Practitioner. I acknowledge and consent to receive the Services by the Practitioner via telemedicine. I understand that the telemedicine visit will involve communicating with the Practitioner through live audiovisual communication technology and the disclosure of certain medical information by electronic transmission. I acknowledge that I have been given the opportunity to request an in-person assessment or other available alternative prior to the telemedicine visit and am voluntarily participating in the telemedicine visit.  I understand that I have the right to withhold or withdraw my consent to the use of telemedicine in the course of my care at any time, without affecting my right to future care or treatment, and that the Practitioner or I may terminate the telemedicine visit at any time. I understand that I have the right to inspect all information obtained and/or recorded in the course of the telemedicine visit and may receive copies of available information for a reasonable fee.  I understand that some of the potential risks of receiving the Services via telemedicine include:  Delay or interruption in medical evaluation due to technological equipment failure or disruption; Information transmitted may not be sufficient (e.g. poor resolution of images) to allow for appropriate medical decision  making by the Practitioner; and/or  In rare instances, security protocols could fail, causing a breach of personal health information.  Furthermore, I acknowledge that it is my responsibility to provide information about my medical history, conditions and care that is complete and accurate to the best of my ability. I acknowledge that Practitioner's advice, recommendations, and/or decision may be based on factors not within their control, such as incomplete or inaccurate data provided by me or distortions of diagnostic images or specimens that may result from electronic transmissions. I understand that the practice of medicine is not an exact science and that Practitioner makes no warranties or guarantees regarding treatment outcomes. I acknowledge that a copy of this consent can be made available to me via my patient portal [facility] MyChart), or I can request a printed copy by calling the office of [facility].    I understand that my insurance will be billed for this visit.   I have read or had this consent read to me. I understand the contents of this consent, which adequately explains the benefits and risks of the Services being provided via telemedicine.  I have been provided ample opportunity to ask questions regarding this consent and the Services and have had my questions answered to my satisfaction. I give my informed consent for the services to be provided through the use of telemedicine in my medical care

## 2023-10-25 NOTE — Telephone Encounter (Signed)
   Name: Marvin Carter  DOB: 1955-08-31  MRN: [id number]  Primary Cardiologist: Dorn Lesches, MD   Preoperative team, please contact this patient and set up a phone call appointment for further preoperative risk assessment. Please obtain consent and complete medication review. Thank you for your help.  I confirm that guidance regarding antiplatelet and oral anticoagulation therapy has been completed and, if necessary, noted below.  Per office protocol, if patient is without any new symptoms or concerns at the time of their virtual visit, he may hold clopidogrel  for 7 days prior to procedure. Please resume clopidogrel  as soon as possible postprocedure, at the discretion of the surgeon.    I also confirmed the patient resides in the state of [place] . As per [facility] Medical Board telemedicine laws, the patient must reside in the state in which the provider is licensed.   Lamarr Satterfield, NP 10/25/2023, 7:37 AM [facility]

## 2023-10-31 ENCOUNTER — Ambulatory Visit: Attending: Cardiology

## 2023-10-31 DIAGNOSIS — Z01810 Encounter for preprocedural cardiovascular examination: Secondary | ICD-10-CM | POA: Diagnosis not present

## 2023-10-31 NOTE — Addendum Note (Signed)
 Addended by: LUCIEN ORREN SAILOR on: 10/31/2023 09:52 AM   Modules accepted: Level of Service

## 2023-10-31 NOTE — Telephone Encounter (Signed)
 I s/w the surgeon office to confirm fax# so that we may fax clearance notes.     Confirmed 2 fax #'s for Dr. Iran office  Main fax# [phone] Direct fax to surgery scheduler Deana: [phone]  Clearance notes have been faxed to Dr. Iran office.

## 2023-10-31 NOTE — Progress Notes (Signed)
 Virtual Visit via Telephone Note   Because of Kairee Isa co-morbid illnesses, he is at least at moderate risk for complications without adequate follow up.  This format is felt to be most appropriate for this patient at this time.  Due to technical limitations with video connection (technology), today's appointment will be conducted as an audio only telehealth visit, and Aly Hauser verbally agreed to proceed in this manner.   All issues noted in this document were discussed and addressed.  No physical exam could be performed with this format.  Evaluation Performed:  Preoperative cardiovascular risk assessment _____________   Date:  10/31/2023   Patient ID:  Trevyon Swor, DOB 1955/10/11, MRN [id number] Patient Location:  Home Provider location:   Office  Primary Care Provider:  Tonita Fallow, MD (Inactive) Primary Cardiologist:  Dorn Lesches, MD  Chief Complaint / Patient Profile   68 y.o. y/o male with a h/o paroxysmal atrial flutter, hypertension, and SVT, hyperlipidemia, CAD with DES to LAD back in 2012 who is pending L3-L5 laminectomy and fusion and presents today for telephonic preoperative cardiovascular risk assessment.  History of Present Illness    Thierno Hun is a 68 y.o. male who presents via audio/video conferencing for a telehealth visit today.  Pt was last seen in cardiology clinic on 01/10/2023 by Dr. Edwyna.  At that time Kalvyn Desa was doing well.  A stress test was ordered in the setting of preop clearance.  This was normal.  An echocardiogram was also ordered due to a cardiac murmur heard during exam.  Results were unremarkable.  The patient is now pending procedure as outlined above. Since his last visit, he says he has a heart.  He has not noticed anything. He lost 45 lbs. He never knew he had atrial flutter. After 5 minutes he has to sit down but this is due to back pain. He goes to [employer] three times a week. He is able to ride the bike and  do some resistance training. He takes advil and tylenol  around the clock for the pain which he acknowledged is not good.   Per office protocol, if patient is without any new symptoms or concerns at the time of their virtual visit, he may hold clopidogrel  for 7 days prior to procedure. Please resume clopidogrel  as soon as possible postprocedure, at the discretion of the surgeon.    Past Medical History    Past Medical History:  Diagnosis Date   Anxiety    Atrial flutter, paroxysmal (HCC) 01/04/2011   Benign hypertensive heart and CKD, stage 3 (GFR 30-59), w CHF (HCC) 10/13/2017   CAD (coronary artery disease), native coronary artery 01/04/2011   Diverticulitis    Diverticulosis    Drug-induced erectile dysfunction 03/01/2015   Essential hypertension    hypertension     GERD (gastroesophageal reflux disease)    Hemorrhoids, internal, with bleeding 11/08/2010   History of meniscectomy of right knee 01/02/2011   Hypertension    NSVT (nonsustained ventricular tachycardia) (HCC) 01/02/2011   Obesity (BMI 30.0-34.9) 01/10/2018   Other abnormal glucose (hx of prediabetes) 04/30/2013   5.7% in 2012, 5.8% in 2015     Primary osteoarthritis of right knee 04/03/2017   Prolapsed external hemorrhoids 10/19/2010   Skin cancer    Nose and ear   Spinal stenosis of lumbar region 07/28/2018   Stented coronary artery 01/04/2011   LAD DES [place].   Testosterone  deficiency 04/30/2013   Vitamin D  deficiency 04/30/2013   Past Surgical History:  Procedure Laterality Date   APPENDECTOMY     CARPAL TUNNEL RELEASE Right 2021   Dr. Sebastian   COLONOSCOPY     CORONARY ANGIOPLASTY WITH STENT PLACEMENT  2012   ELBOW SURGERY Right 2021   Cyst excision/nerve decompression, Dr. Sebastian   HAND SURGERY     right   KNEE ARTHROSCOPY     x 8 left   KNEE ARTHROSCOPY Right 04/21/2012   Procedure: ARTHROSCOPY KNEE;  Surgeon: Lamar DELENA Millman, MD;  Location: [facility] OR;  Service: Orthopedics;  Laterality: Right;   medial and lateral meniscectomies   LEFT HEART CATHETERIZATION WITH CORONARY ANGIOGRAM N/A 01/03/2011   Procedure: LEFT HEART CATHETERIZATION WITH CORONARY ANGIOGRAM;  Surgeon: Dorn JINNY Lesches, MD;  Location: [facility] CATH LAB;  Service: Cardiovascular;  Laterality: N/A;   PERCUTANEOUS CORONARY STENT INTERVENTION (PCI-S) N/A 01/03/2011   Procedure: PERCUTANEOUS CORONARY STENT INTERVENTION (PCI-S);  Surgeon: Dorn JINNY Lesches, MD;  Location: [facility] CATH LAB;  Service: Cardiovascular;  Laterality: N/A;   ROTATOR CUFF REPAIR     bilateral   TONSILLECTOMY     TOTAL KNEE ARTHROPLASTY Right 04/15/2017   TOTAL KNEE ARTHROPLASTY Right 04/15/2017   Procedure: TOTAL KNEE ARTHROPLASTY;  Surgeon: Millman Lamar, MD;  Location: [facility] OR;  Service: Orthopedics;  Laterality: Right;   TOTAL KNEE ARTHROPLASTY Left 04/01/2023   Procedure: TOTAL KNEE ARTHROPLASTY;  Surgeon: Edna Toribio DELENA, MD;  Location: [facility] ORS;  Service: Orthopedics;  Laterality: Left;   TRICEPS TENDON REPAIR Bilateral     Allergies  Allergies  Allergen Reactions   Hctz [Hydrochlorothiazide]     Unknown reaction, pt does not recall having a reaction to this medication   Tape Rash    Clear tape    Home Medications    Prior to Admission medications   Medication Sig Start Date End Date Taking? Authorizing Provider  atenolol  (TENORMIN ) 100 MG tablet TAKE 1 TABLET BY MOUTH DAILY FOR BLOOD PRESSURE 03/10/23   Wilkinson, Dana E, NP  atorvastatin  (LIPITOR) 40 MG tablet Take  1 tablet  Daily  for Cholesterol                                                                      /                                           TAKE                                                     BY                                               MOUTH 03/11/23   Tonita Fallow, MD  b complex vitamins capsule Take 1 capsule by mouth daily.    [provider]  buPROPion  (WELLBUTRIN  XL) 300 MG 24 hr tablet TAKE 1  TABLET BY MOUTH EVERY MORNING FOR MOOD,FOCUS AND  CONCENTRATION 03/10/23   Wilkinson, Dana E, NP  cholecalciferol (VITAMIN D3) 25 MCG (1000 UNIT) tablet Take 1,000 Units by mouth daily.    [provider]  clopidogrel  (PLAVIX ) 75 MG tablet Take 1 tablet Daily to prevent Blood Clots 03/03/23   Cranford, Tonya, NP  diclofenac  sodium (VOLTAREN ) 1 % GEL Apply 2 to 4 grams 2 to 4 x / day 01/02/18   Tonita Fallow, MD  diltiazem  (CARDIZEM  LA) 240 MG 24 hr tablet Take 1/2 tablet (120 mg) every day for BP                                                /                                                                   TAKE                                         BY                                                 MOUTH 02/04/23   Tonita Fallow, MD  doxazosin  (CARDURA ) 8 MG tablet Take  1/2 tablet (4 mg)  at Bedtime  for BP & Prostate                                                /                                                                   TAKE                                         BY                                                 MOUTH 02/04/23   Tonita Fallow, MD  furosemide  (LASIX ) 80 MG tablet TAKE 1 TABLET BY MOUTH EVERY DAY FOR BLOOD PRESSURE AND FLUID RETENTION OR ANKLE SWELLING 01/21/23   Cranford, Tonya, NP  Magnesium  400 MG CAPS Take 800 mg by mouth daily.    [provider]  meloxicam  (MOBIC ) 15 MG tablet Take 1 tablet (15 mg total) by  mouth daily. 04/02/23 04/01/24  Edna Toribio LABOR, MD  Multiple Vitamin (MULTIVITAMIN WITH MINERALS) TABS Take 2 tablets by mouth daily. Mega Men Multivitamin.    [provider]  nitroGLYCERIN  (NITROSTAT ) 0.4 MG SL tablet Dissolve 1 tablet under tongue every 3 to 5 minutes as needed for Chest Pain ( Do NOT take with Cialis  ) 05/22/21   Tonita Fallow, MD  olmesartan  (BENICAR ) 40 MG tablet Take 1/2  to 1 tablet Daily for BP 03/15/22   Cranford, Tonya, NP  pantoprazole  (PROTONIX ) 40 MG tablet Take  1 tablet  Daily to Prevent Heartburn & Indigestion                                                                         /                          TAKE                                                          BY                                                        MOUTH 07/02/22   Tonita Fallow, MD  tadalafil  (CIALIS ) 20 MG tablet Take  1/2 to 1 tablet  every 2 to 3 days  as needed for XXXX 11/18/22   Tonita Fallow, MD  tamsulosin  (FLOMAX ) 0.4 MG CAPS capsule Take 2 capsules (0.8 mg) at Bedtime for Prostate                                 /                                                                   TAKE                                         BY                                                 MOUTH 02/04/23   Tonita Fallow, MD  traZODone  (DESYREL ) 150 MG tablet TAKE 1 TABLET(150 MG) BY MOUTH AT BEDTIME 03/09/23   Wilkinson, Dana E, NP  vitamin E 180 MG (400 UNITS) capsule Take 400 Units by mouth daily.  [provider]  zinc gluconate 50 MG tablet Take 50 mg by mouth daily.    [provider]    Physical Exam    Vital Signs:  Mccartney Chuba does not have vital signs available for review today.  Given telephonic nature of communication, physical exam is limited. AAOx3. NAD. Normal affect.  Speech and respirations are unlabored.  Accessory Clinical Findings    None  Assessment & Plan    1.  Preoperative Cardiovascular Risk Assessment:  Mr. Heaphy perioperative risk of a major cardiac event is 6.6% according to the Revised Cardiac Risk Index (RCRI).  Therefore, he is at high risk for perioperative complications.   His functional capacity is good at 5.07 METs according to the Duke Activity Status Index (DASI). Recommendations: According to ACC/AHA guidelines, no further cardiovascular testing needed.  The patient may proceed to surgery at acceptable risk.   Antiplatelet and/or Anticoagulation Recommendations: Clopidogrel  (Plavix ) can be held for 7 days prior to his surgery and resumed as soon as possible post op.  The patient was  advised that if he develops new symptoms prior to surgery to contact our office to arrange for a follow-up visit, and he verbalized understanding.  A copy of this note will be routed to requesting surgeon.  Time:   Today, I have spent 10 minutes with the patient with telehealth technology discussing medical history, symptoms, and management plan.     Orren LOISE Fabry, PA-C  10/31/2023, 8:59 AM

## 2023-12-04 ENCOUNTER — Other Ambulatory Visit (HOSPITAL_BASED_OUTPATIENT_CLINIC_OR_DEPARTMENT_OTHER): Payer: Self-pay

## 2023-12-04 MED ORDER — FLUZONE HIGH-DOSE 0.5 ML IM SUSY
0.5000 mL | PREFILLED_SYRINGE | Freq: Once | INTRAMUSCULAR | 0 refills | Status: AC
  Filled 2023-12-04: qty 0.5, 1d supply, fill #0

## 2024-01-03 ENCOUNTER — Other Ambulatory Visit: Payer: Self-pay | Admitting: Nurse Practitioner
